# Patient Record
Sex: Female | Born: 1940 | Race: White | Hispanic: No | Marital: Married | Smoking: Former smoker
Health system: Southern US, Community
[De-identification: ages and names within clinical notes are randomized; demographics above are authoritative.]

## PROBLEM LIST (undated history)

## (undated) DIAGNOSIS — H409 Unspecified glaucoma: Secondary | ICD-10-CM

## (undated) DIAGNOSIS — I10 Essential (primary) hypertension: Secondary | ICD-10-CM

## (undated) DIAGNOSIS — C50919 Malignant neoplasm of unspecified site of unspecified female breast: Secondary | ICD-10-CM

## (undated) DIAGNOSIS — E785 Hyperlipidemia, unspecified: Secondary | ICD-10-CM

## (undated) HISTORY — DX: Essential (primary) hypertension: I10

## (undated) HISTORY — DX: Hyperlipidemia, unspecified: E78.5

## (undated) HISTORY — DX: Malignant neoplasm of unspecified site of unspecified female breast: C50.919

## (undated) HISTORY — DX: Unspecified glaucoma: H40.9

---
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
## 1972-07-29 HISTORY — PX: TUBAL LIGATION: SHX77

## 1991-07-30 DIAGNOSIS — C50919 Malignant neoplasm of unspecified site of unspecified female breast: Secondary | ICD-10-CM

## 1991-07-30 HISTORY — DX: Malignant neoplasm of unspecified site of unspecified female breast: C50.919

## 1991-07-30 HISTORY — PX: MASTECTOMY: SHX3

## 1999-06-11 ENCOUNTER — Encounter: Admission: RE | Admit: 1999-06-11 | Discharge: 1999-06-11 | Payer: Self-pay | Admitting: Internal Medicine

## 1999-06-11 ENCOUNTER — Encounter: Payer: Self-pay | Admitting: Internal Medicine

## 2001-01-05 ENCOUNTER — Encounter: Payer: Self-pay | Admitting: Internal Medicine

## 2001-01-05 ENCOUNTER — Encounter: Admission: RE | Admit: 2001-01-05 | Discharge: 2001-01-05 | Payer: Self-pay | Admitting: Internal Medicine

## 2001-03-04 ENCOUNTER — Encounter: Admission: RE | Admit: 2001-03-04 | Discharge: 2001-03-04 | Payer: Self-pay | Admitting: Internal Medicine

## 2001-03-04 ENCOUNTER — Encounter: Payer: Self-pay | Admitting: Internal Medicine

## 2001-10-21 ENCOUNTER — Encounter: Payer: Self-pay | Admitting: Internal Medicine

## 2001-10-21 ENCOUNTER — Encounter: Admission: RE | Admit: 2001-10-21 | Discharge: 2001-10-21 | Payer: Self-pay | Admitting: Internal Medicine

## 2002-03-03 ENCOUNTER — Encounter: Admission: RE | Admit: 2002-03-03 | Discharge: 2002-03-03 | Payer: Self-pay

## 2003-03-22 ENCOUNTER — Other Ambulatory Visit: Admission: RE | Admit: 2003-03-22 | Discharge: 2003-03-22 | Payer: Self-pay | Admitting: Internal Medicine

## 2003-04-22 ENCOUNTER — Encounter: Payer: Self-pay | Admitting: Internal Medicine

## 2003-04-22 ENCOUNTER — Encounter: Admission: RE | Admit: 2003-04-22 | Discharge: 2003-04-22 | Payer: Self-pay | Admitting: Internal Medicine

## 2003-05-19 ENCOUNTER — Encounter: Admission: RE | Admit: 2003-05-19 | Discharge: 2003-05-19 | Payer: Self-pay | Admitting: Internal Medicine

## 2003-05-19 ENCOUNTER — Encounter: Payer: Self-pay | Admitting: Internal Medicine

## 2003-05-24 ENCOUNTER — Encounter: Admission: RE | Admit: 2003-05-24 | Discharge: 2003-05-24 | Payer: Self-pay | Admitting: Internal Medicine

## 2003-05-24 IMAGING — NM NM BONE WHOLE BODY
4 series · 4 of 4 positions shown · non-contrast
Comparison: none

CLINICAL DATA: Left anterolateral chest discomfort, history of bilateral mastectomies in [MJ] for breast carcinoma.

[wb whole body · 2.36mm/px · 1 of 1 slices shown (1 of 4)]
[im 1/1]
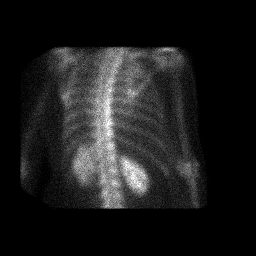

[wb whole body · 2.33mm/px · 1 of 1 slices shown (2 of 4)]
[im 1/1]
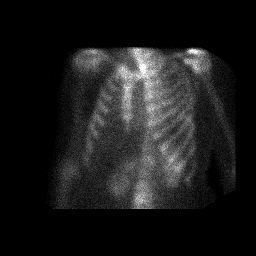

[wb whole body · 1.18mm/px · 1 of 1 slices shown (3 of 4)]
[im 1/1]
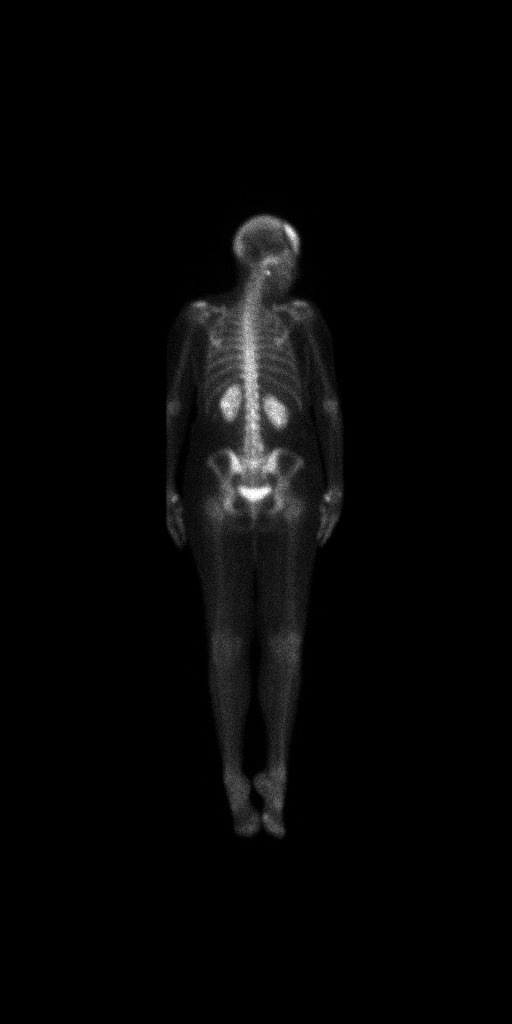

[wb whole body · 1.17mm/px · 1 of 1 slices shown (4 of 4)]
[im 1/1]
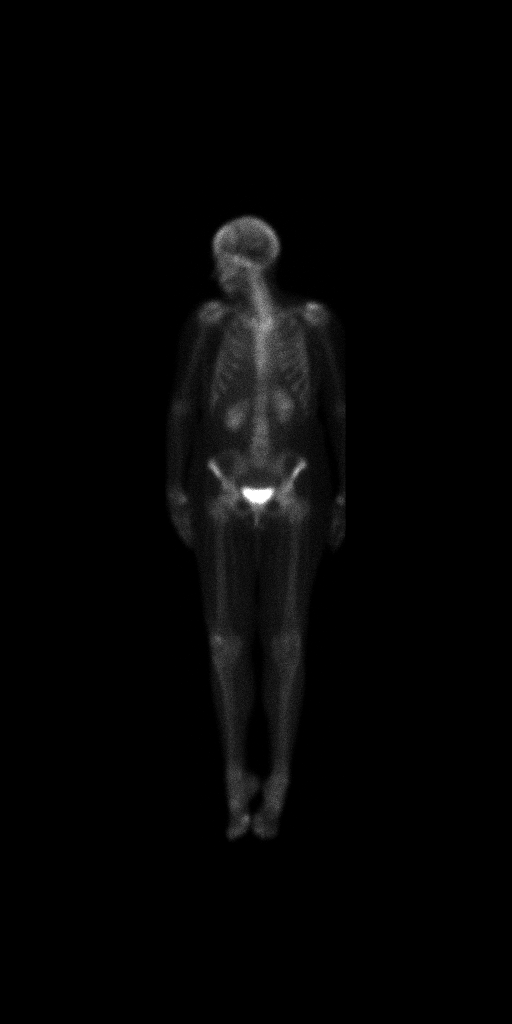

[4 of 4 positions shown; findings below may reference images not displayed]

WHOLE BODY NM BONE SCAN
Following the intravenous administration of [MJ] of [MJ], delayed static images of the bony skeleton were obtained.  These views demonstrate mildly increased tracer uptake in the region of the left acromioclavicular joint, compatible with degenerative change.  Also noted is mildly increased tracer uptake at the posterolateral aspect of the right craniocervical junction, corresponding to artifactual activity shown subsequently on the patient'VELROY VELROY.  Also demonstrated is mildly increased tracer uptake in the radial aspects of both wrists and in the right patella and right 1st metatarsal phalangeal joint, also compatible with degenerative changes.  Changes of hyperostosis frontalis are noted.  Normal renal and bladder activity is noted. 

IMPRESSION
Degenerative changes as described above.  No evidence of bony metastatic disease and no abnormality in the region of discomfort reported by the patient.

## 2003-08-08 ENCOUNTER — Encounter: Admission: RE | Admit: 2003-08-08 | Discharge: 2003-08-08 | Payer: Self-pay | Admitting: Internal Medicine

## 2004-01-11 ENCOUNTER — Encounter: Admission: RE | Admit: 2004-01-11 | Discharge: 2004-01-11 | Payer: Self-pay | Admitting: Internal Medicine

## 2004-01-11 IMAGING — CT CT CHEST W/ CM
1 of 2 series · 15 of 30 positions shown, 19 images · IV contrast ([ID] OMNI 300)
Comparison: none

CLINICAL DATA: Fever of unknown origin, fatigue, cough, and some dysphagia. 
 CT NECK W/CONTRAST: 
 Multidetector helical scans through the neck were performed after IV contrast media was given.  [FK] of Omnipaque 300 were given as the contrast media.  
 The paranasal sinuses that are visualized are clear.  There is asymmetrical prominence of the right jugular bulb of doubtful significance.  No bony abnormality is seen.  There are a few slightly prominent nodes in the anterior cervical chain particularly on the left.  On image number 51 there is a node adjacent to the left jugular vein of 6 x 11mm.  The oro-nasopharynx appears grossly normal.  However there is prominence of the aryepiglottic folds extending toward the puriform sinuses left greater than right.  The attenuation of the soft tissue is somewhat inhomogeneous and supraglottic tumor cannot be excluded.  Direct visualization of this region is recommended.  The level of the cords in the subglottic airway appears normal.

[Series 2: neck/chest -head first · axial · 0.64mm/px · z∈[-439,-9]mm · 15 of 173 slices shown, 19 images]
[im 9/173  mediastinal]
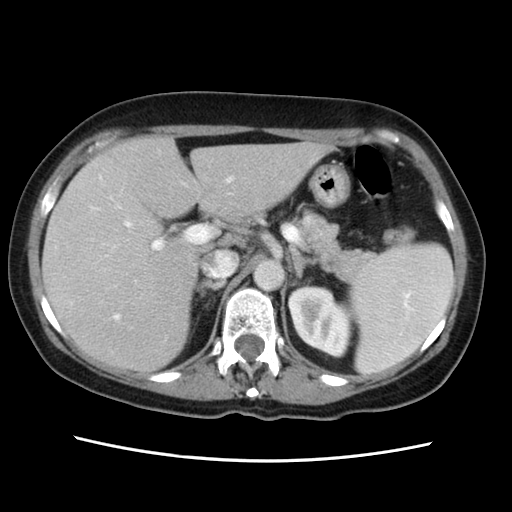
[im 9/173  lung]
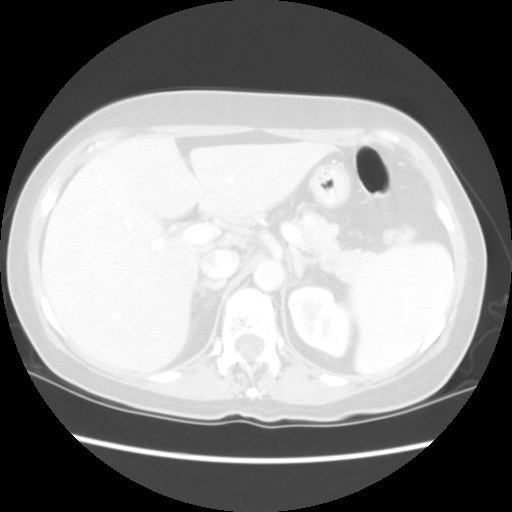
[im 26/173  lung]
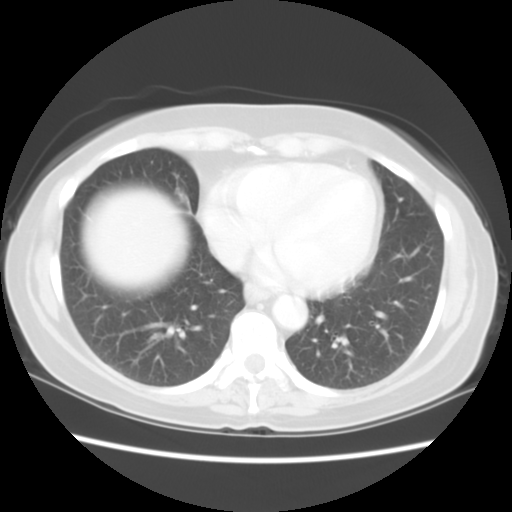
[im 35/173  lung]
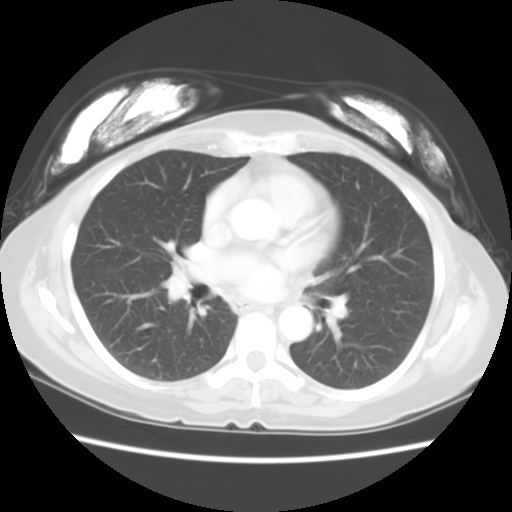
[im 44/173  lung]
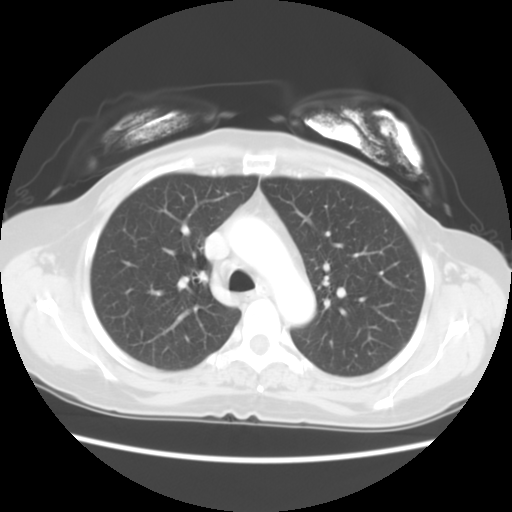
[im 54/173  mediastinal]
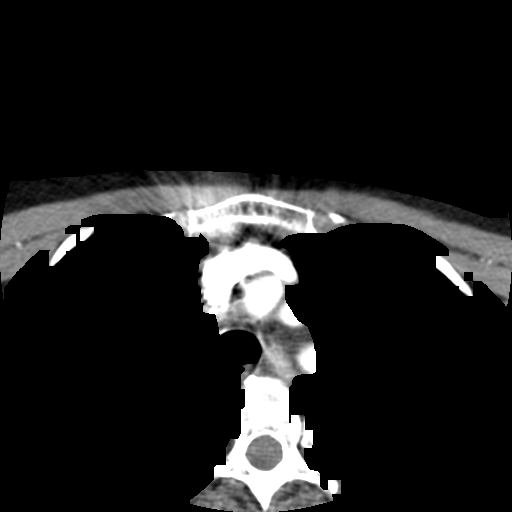
[im 54/173  lung]
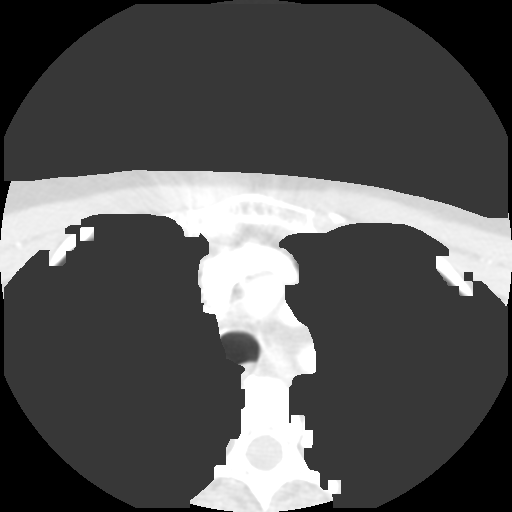
[im 61/173  lung]
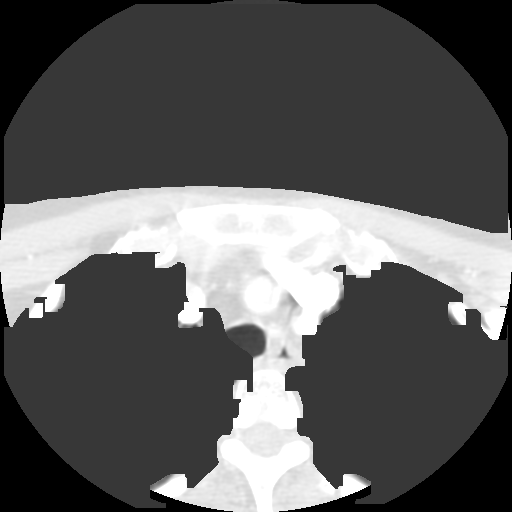
[im 69/173  lung]
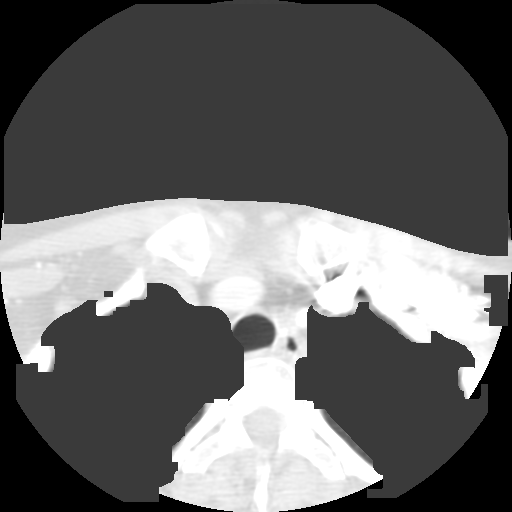
[im 87/173  lung]
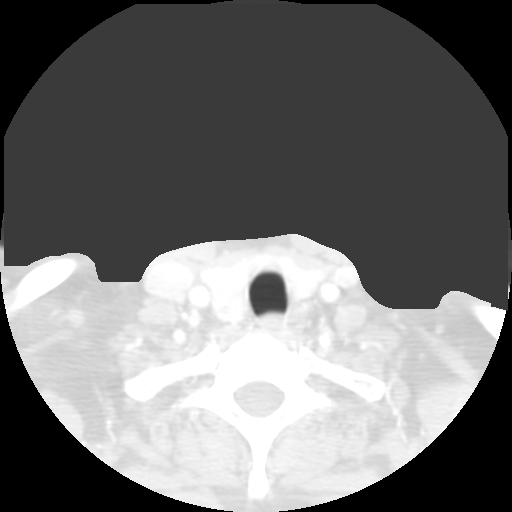
[im 95/173  mediastinal]
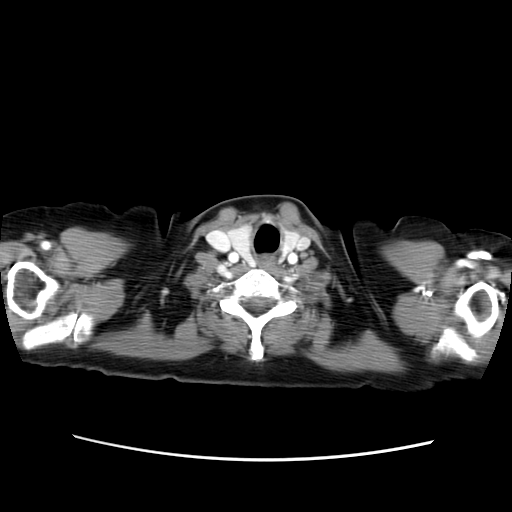
[im 95/173  lung]
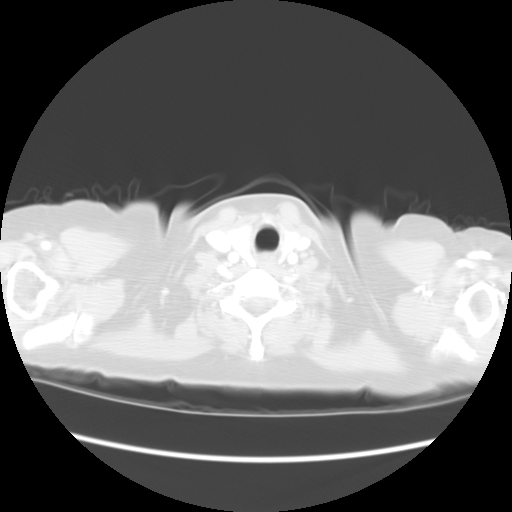
[im 104/173  lung]
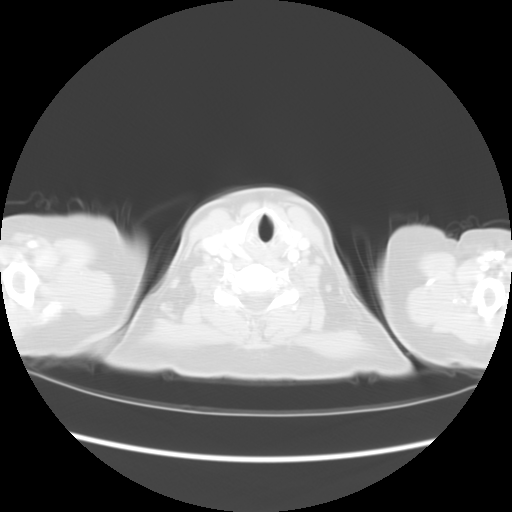
[im 121/173  lung]
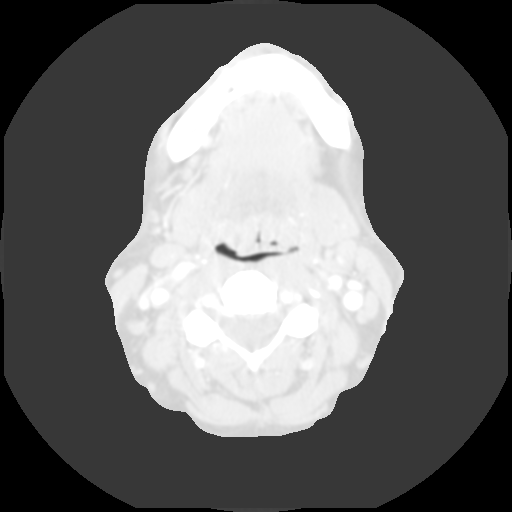
[im 130/173  lung]
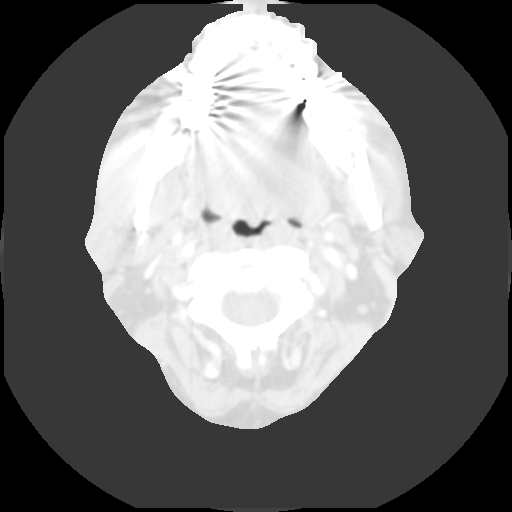
[im 138/173  mediastinal]
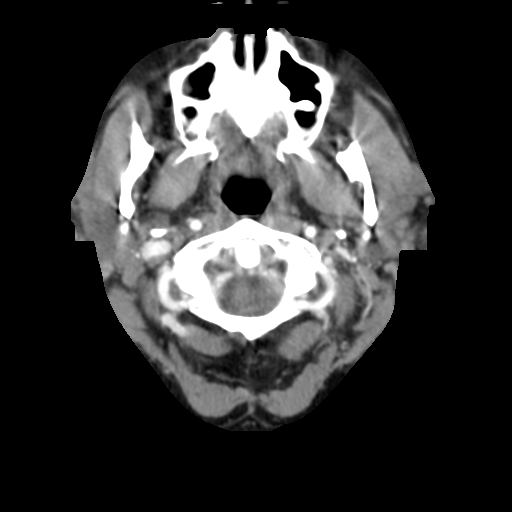
[im 138/173  lung]
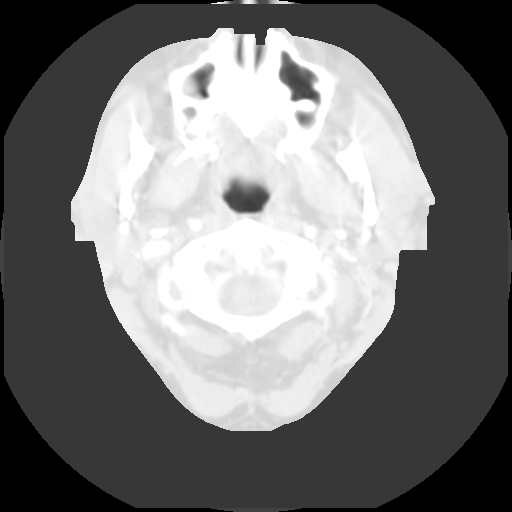
[im 155/173  lung]
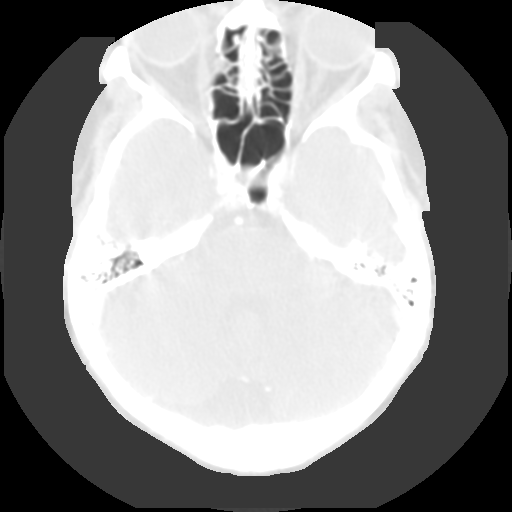
[im 164/173  lung]
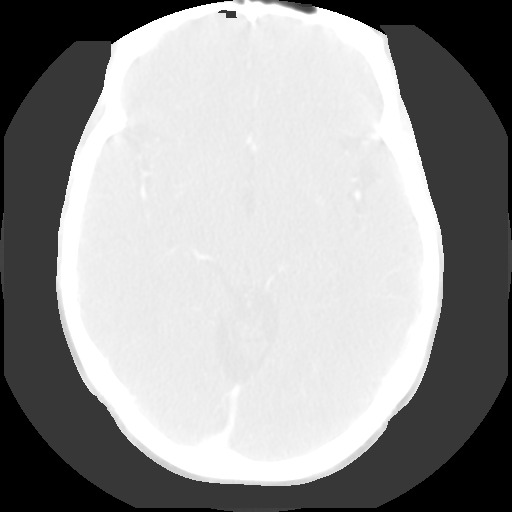

[15 of 30 positions shown; findings below may reference images not displayed]

IMPRESSION: 1.  Thickened mucosa of the aryepiglottic folds and narrowing of the left puriform sinus.  Tumor cannot be excluded and direct visualization of this region is recommended.  
 2.  There are a few slightly prominent anterior cervical chain nodes on the left. 
 CT CHEST W/CONTRAST: 
 Scans were continued through the chest after oral and IV contrast media were given.  There is only a single lung nodule present in the anterior left lower lobe on image number 34 measuring 3mm which is non calcified.  No pleural effusion is seen.  On mediastinal window images no mediastinal or hilar adenopathy is seen.  The pulmonary arteries and thoracic aorta opacify normally.  There is mild cardiomegaly present.  The spleen is slightly prominent on scans through the upper abdomen.
IMPRESSION: 1.  Single non calcified 3mm left lower lobe nodule.  No adenopathy. 
 2.  Slightly prominent spleen of questionable significance.

## 2004-04-12 ENCOUNTER — Other Ambulatory Visit: Admission: RE | Admit: 2004-04-12 | Discharge: 2004-04-12 | Payer: Self-pay | Admitting: Internal Medicine

## 2004-05-08 ENCOUNTER — Ambulatory Visit (HOSPITAL_COMMUNITY): Admission: RE | Admit: 2004-05-08 | Discharge: 2004-05-08 | Payer: Self-pay | Admitting: Internal Medicine

## 2005-09-10 ENCOUNTER — Other Ambulatory Visit: Admission: RE | Admit: 2005-09-10 | Discharge: 2005-09-10 | Payer: Self-pay | Admitting: Internal Medicine

## 2006-03-07 ENCOUNTER — Encounter: Admission: RE | Admit: 2006-03-07 | Discharge: 2006-03-07 | Payer: Self-pay | Admitting: Internal Medicine

## 2006-03-07 IMAGING — CT CT NECK W/ CM
4 of 6 series · 16 of 33 positions shown, 18 images · IV contrast ([ID] OMNI 300)
Comparison: Prior CT neck and chest of [DATE].

CLINICAL DATA: Follow-up lung nodule.  History of breast carcinoma.  Follow-up questionable abnormality of the left piriform sinus on prior CT neck.
 CT NECK WITH CONTRAST:
TECHNIQUE: Multidetector CT imaging of the neck was performed following the standard protocol during administration of intravenous contrast.
 Contrast:  100 cc Omnipaque 300
TECHNIQUE: Multidetector CT imaging of the chest was performed following the standard protocol during bolus administration of intravenous contrast.

[Series 2: chest/neck · axial · 0.70mm/px · z∈[-516,-124]mm · 7 of 133 slices shown, 9 images]
[im 17/133  soft-tissue]
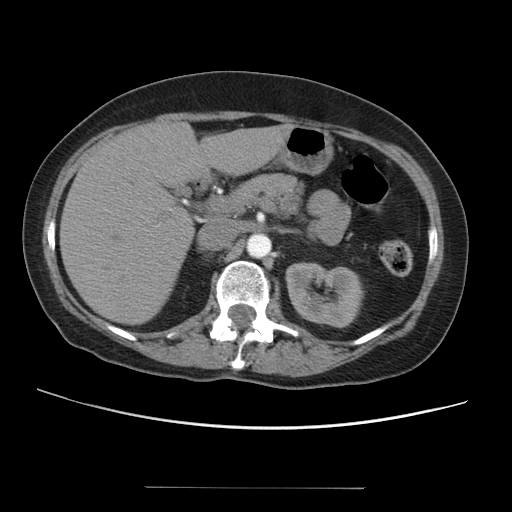
[im 17/133  bone]
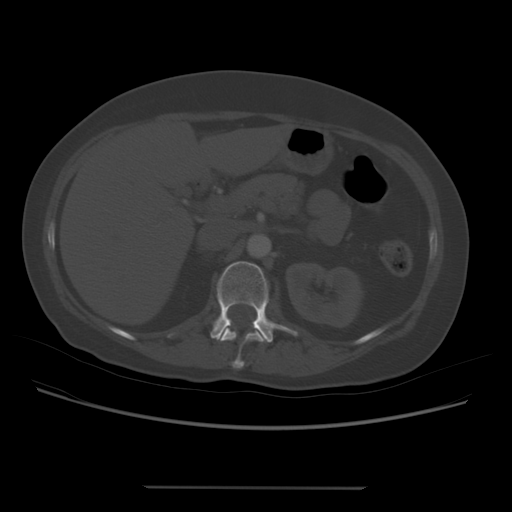
[im 34/133  bone]
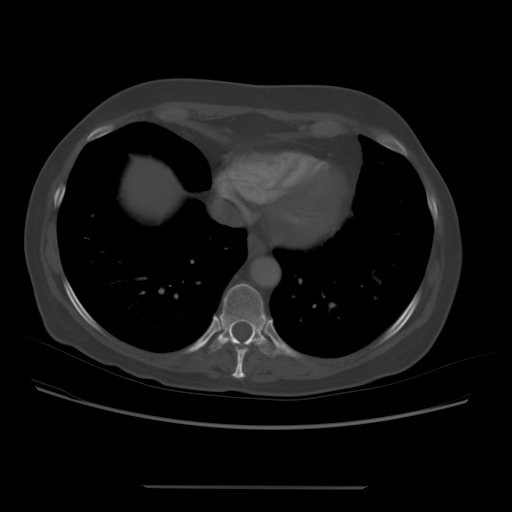
[im 50/133  bone]
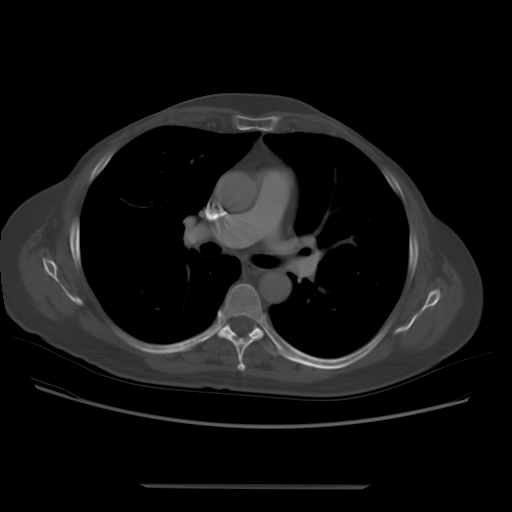
[im 67/133  bone]
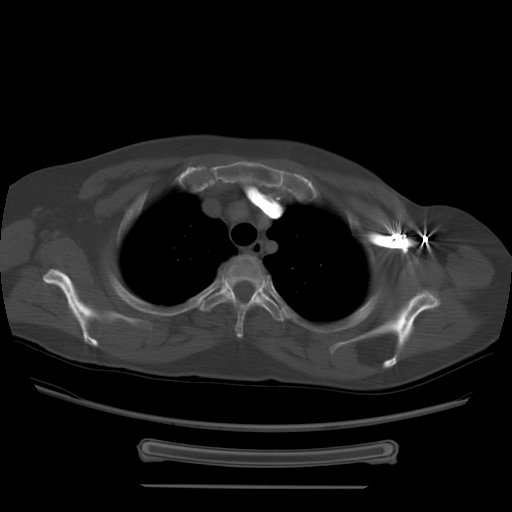
[im 83/133  soft-tissue]
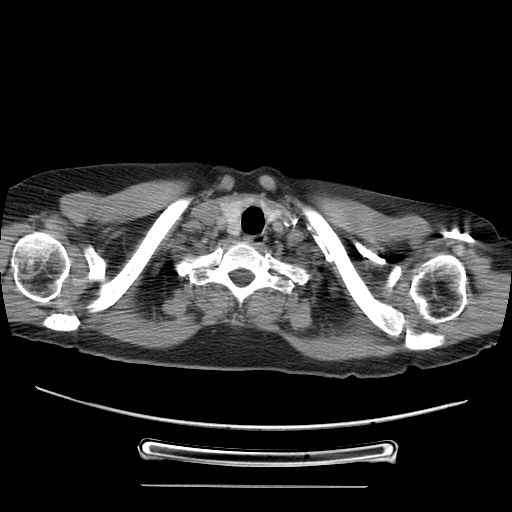
[im 83/133  bone]
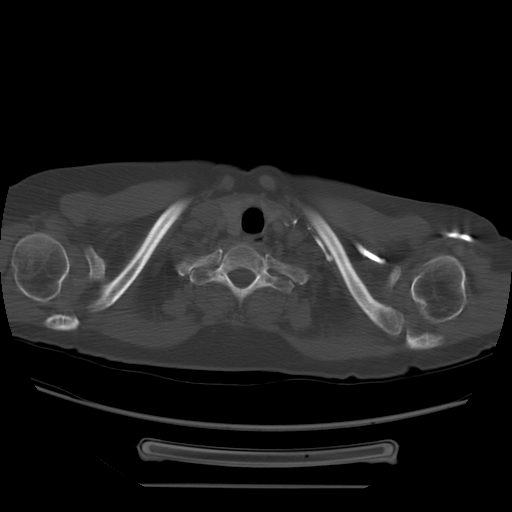
[im 100/133  bone]
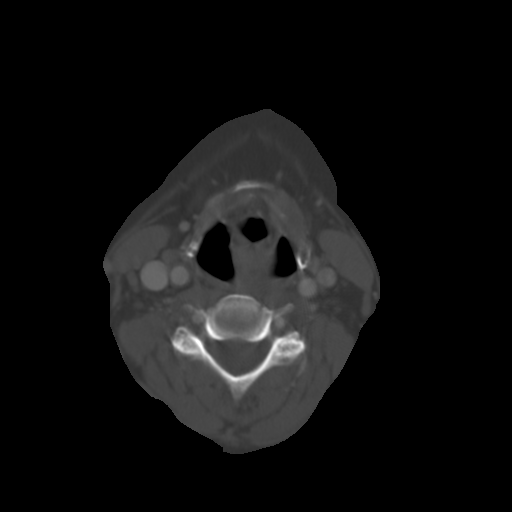
[im 116/133  bone]
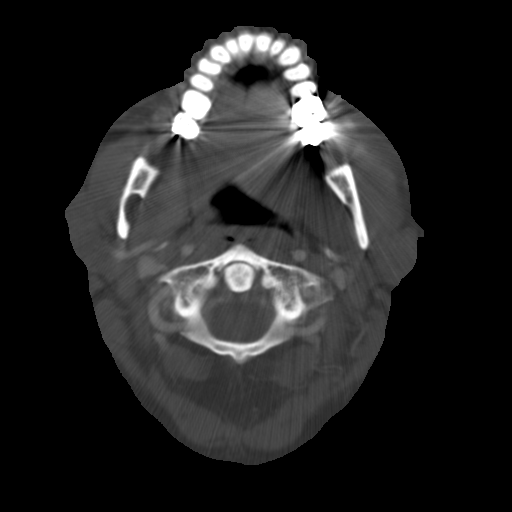

[Series 3: lung · axial · 0.70mm/px · z∈[-506,-322]mm · 3 of 75 slices shown]
[im 19/75  bone]
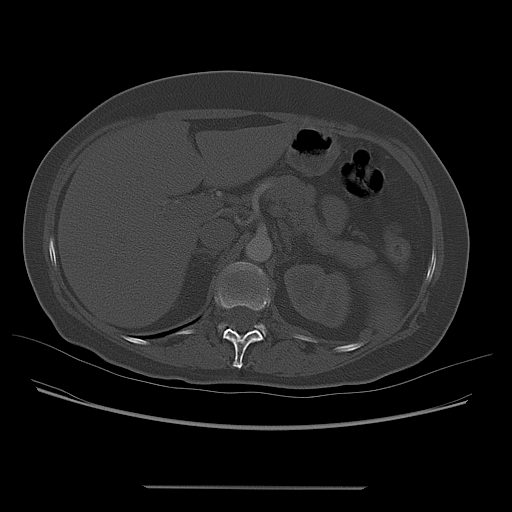
[im 38/75  bone]
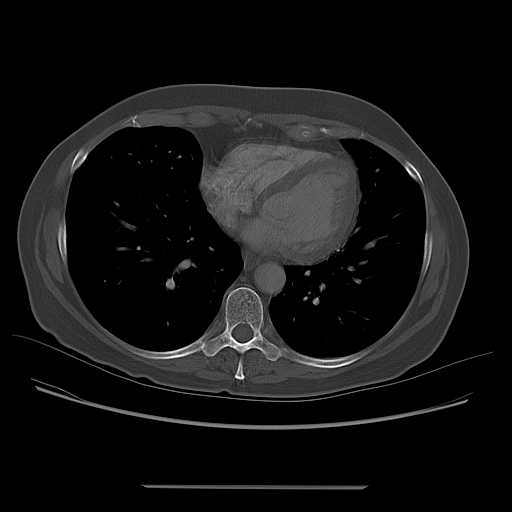
[im 56/75  bone]
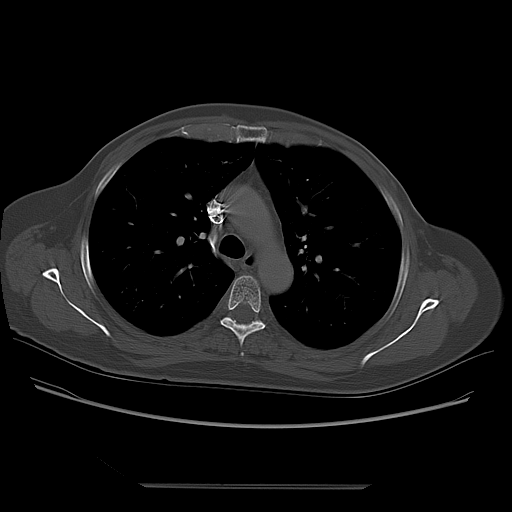

[Series 401: reformatted · coronal · 0.43mm/px · 1 of 70 slices shown (1 of 2)]
[im 35/70  bone]
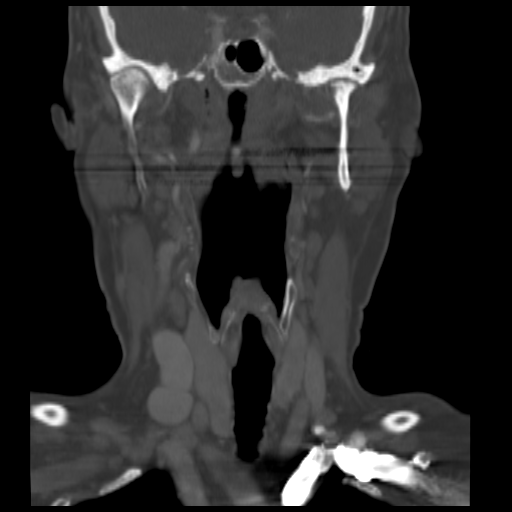

[Series 402: reformatted · sagittal · 0.74mm/px · 5 of 124 slices shown (2 of 2)]
[im 31/124  bone]
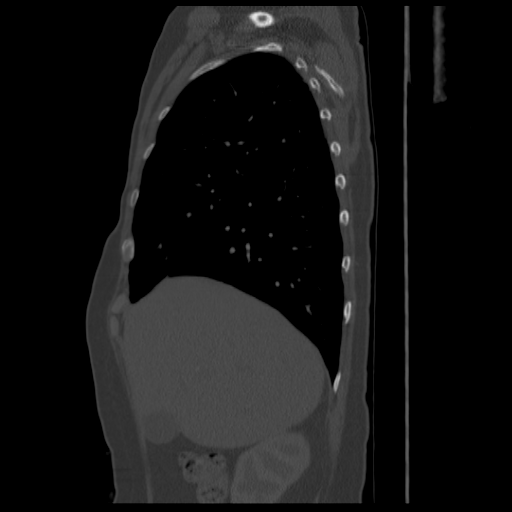
[im 47/124  bone]
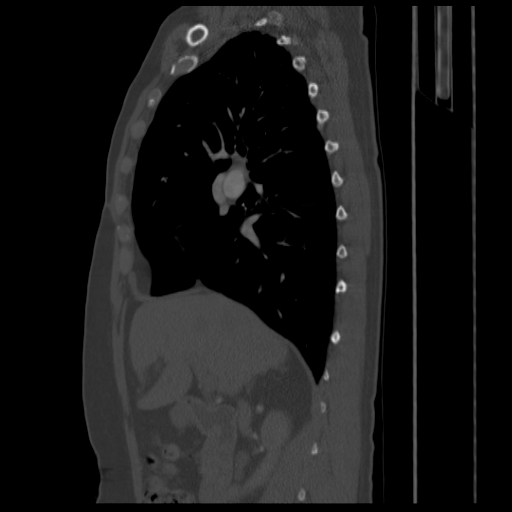
[im 62/124  bone]
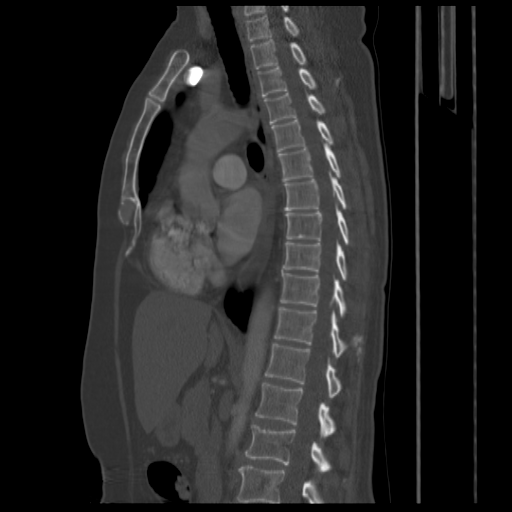
[im 77/124  bone]
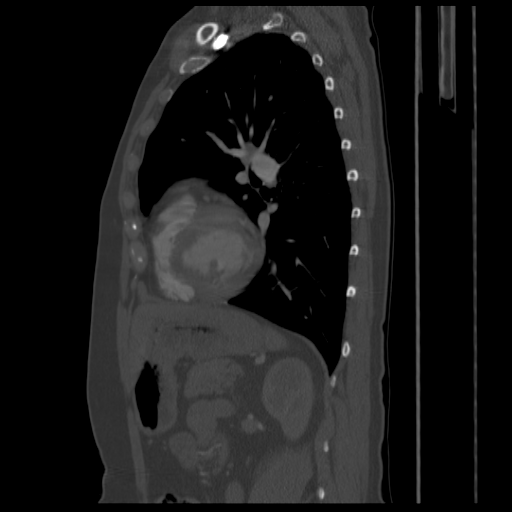
[im 93/124  bone]
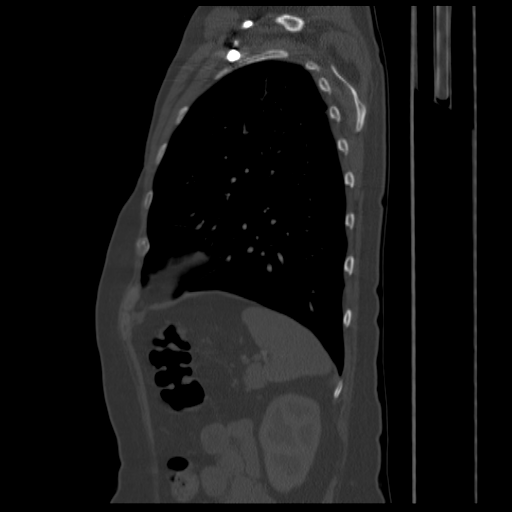

[16 of 33 positions shown; findings below may reference images not displayed]

FINDINGS: Prominent cisterna magna is again noted.  The oro-nasopharynx appears normal as does the lingual tissue.  The aryepiglottic folds, vallecula, and piriform sinuses appear normal.  The left piriform sinus does distend with air currently.   The level of the true cords is normal as is the subglottic airway.  No adenopathy is seen.  Small thyroid nodules are present and appear stable.  The paranasal sinuses that are visualized are clear.
IMPRESSION: Negative CT of the neck.  No mass or adenopathy is seen.
 CT CHEST WITH CONTRAST:
FINDINGS: Scans were continued through the chest and compared to the CT chest of [DATE].  No mediastinal or hilar adenopathy is seen.  The thoracic aorta and pulmonary arteries opacify with no significant abnormality.  On lung window images, the 3 mm nodule within the left lower lobe anterolaterally is stable.  Since the patient is low risk and this has remained stable, no further evaluation is necessary.  No new lung  nodule is seen.   No effusion is noted.
IMPRESSION: 1.  Stable 3 mm nodule in the left lower lobe.   No further follow-up is felt to be necessary.
 2.  No new lung nodule is seen.

## 2007-08-20 ENCOUNTER — Other Ambulatory Visit: Admission: RE | Admit: 2007-08-20 | Discharge: 2007-08-20 | Payer: Self-pay | Admitting: Family Medicine

## 2007-08-21 ENCOUNTER — Encounter: Admission: RE | Admit: 2007-08-21 | Discharge: 2007-08-21 | Payer: Self-pay | Admitting: Family Medicine

## 2007-08-21 IMAGING — US US EXTREM LOW NON VASC*R*
1 series · 7 of 7 positions shown · non-contrast
Comparison: none

CLINICAL DATA: Right thigh nodule, evaluate for lipoma.
 ULTRASOUND OF THE RIGHT LOWER EXTREMITY: 
 Ultrasound of the right posterior thigh was performed.  At the site of the palpable abnormality, there is a poorly defined oval area of increased echogenicity.  This probably represents a lipoma measuring 2.1 cm sagittally with a depth of 0.7 cm and width of 1.4 cm.  This is not quite as pliable palpably as lipomas generally are and therefore I recommend follow-up clinically.  If this area increases in size, then MRI of the right thigh would be recommended.

[Series 1: us extrem low non vasc*right* · 0.07mm/px · 7 of 7 slices shown]
[im 1/7]
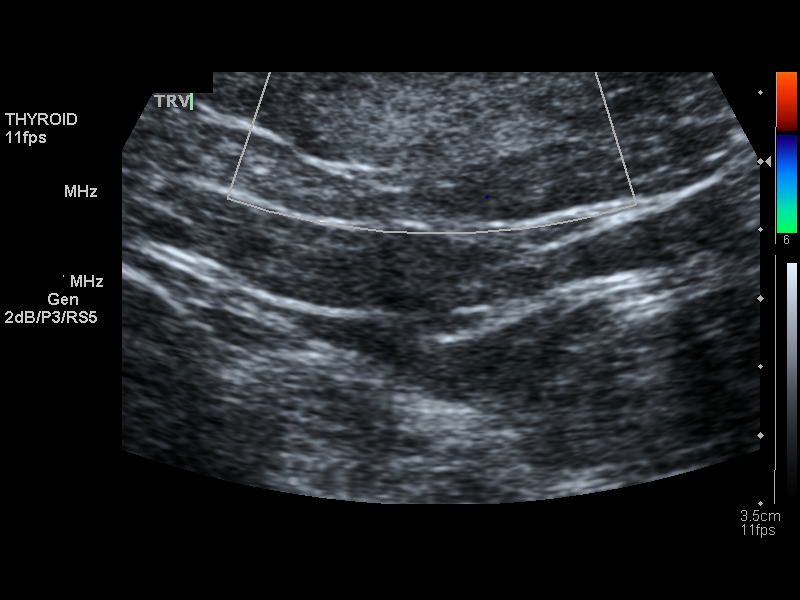
[im 2/7]
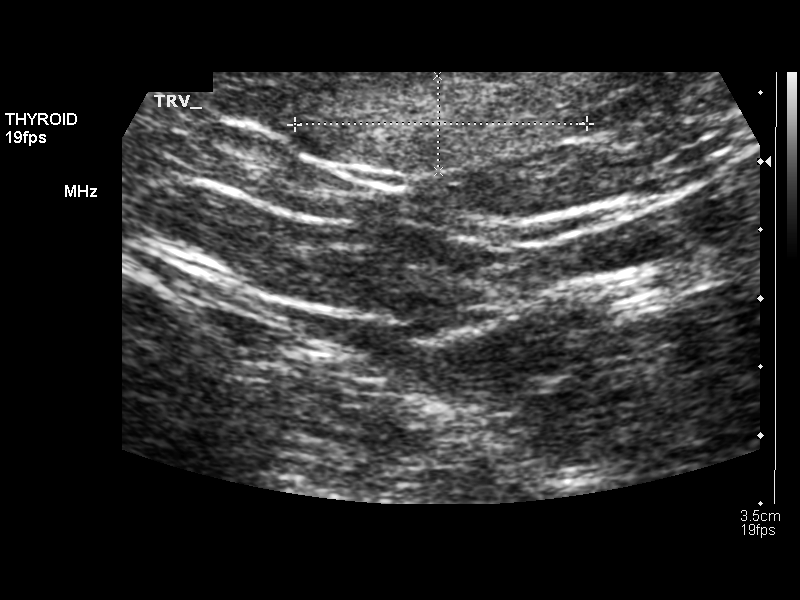
[im 3/7]
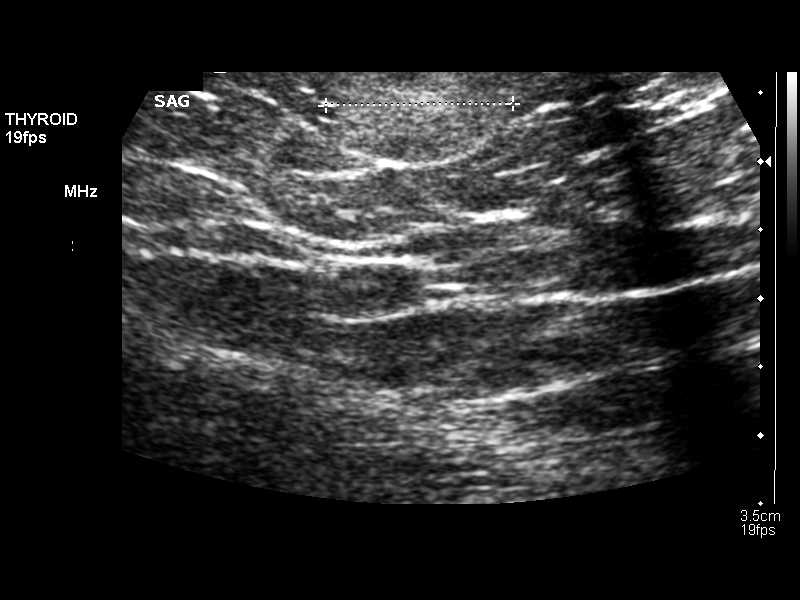
[im 4/7]
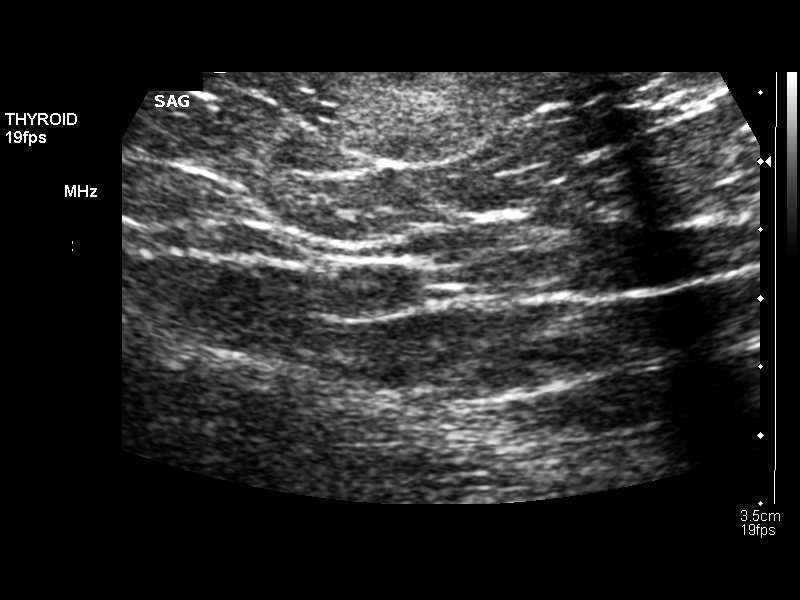
[im 5/7]
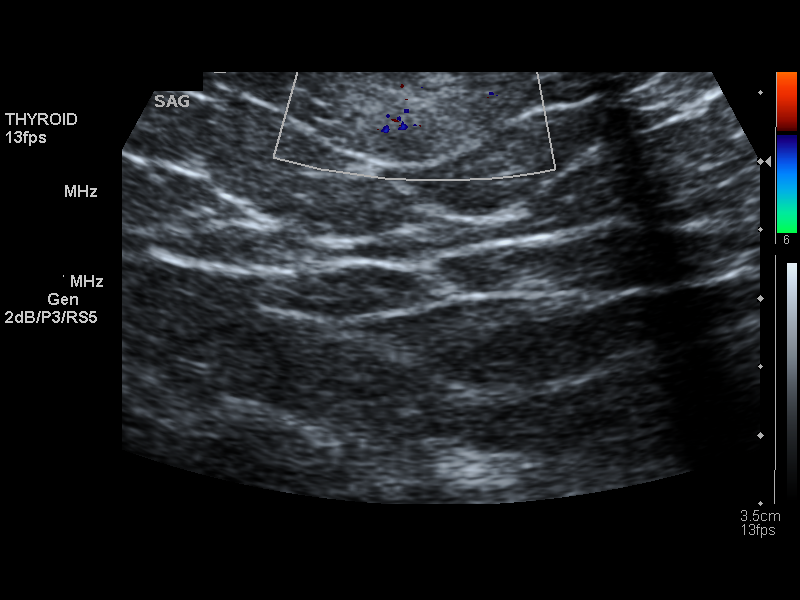
[im 6/7]
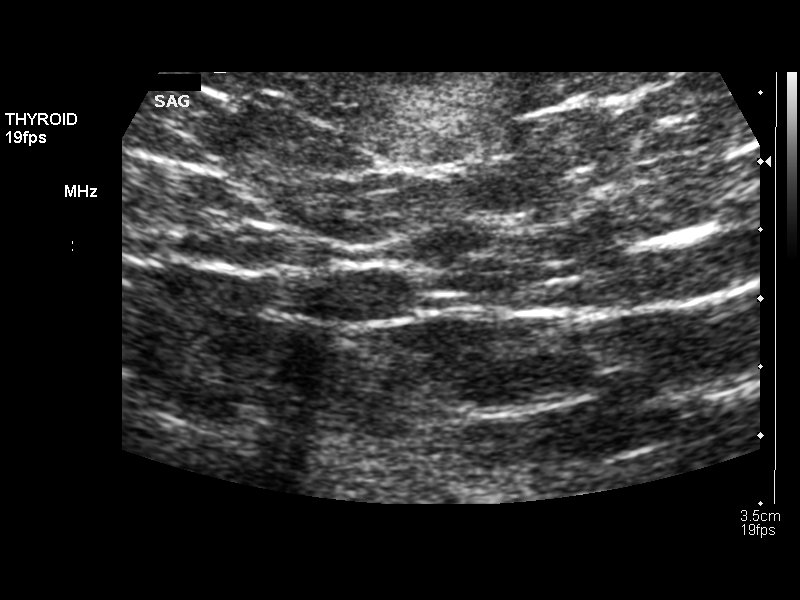
[im 7/7]
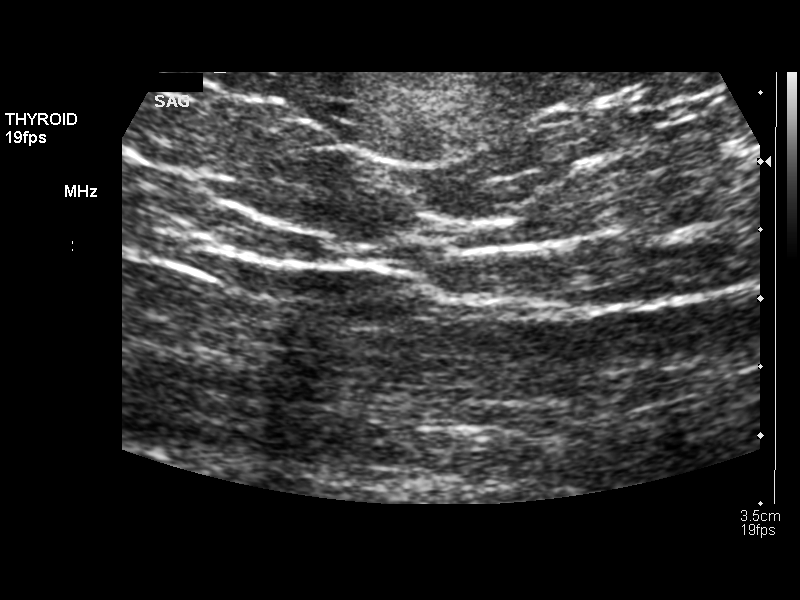

[7 of 7 positions shown; findings below may reference images not displayed]

IMPRESSION: Probable lipoma in the posterior right upper thigh, as described above.  Recommend clinical follow-up and consider MRI if further assessment is warranted.

## 2009-09-08 ENCOUNTER — Other Ambulatory Visit: Admission: RE | Admit: 2009-09-08 | Discharge: 2009-09-08 | Payer: Self-pay | Admitting: Family Medicine

## 2010-08-18 ENCOUNTER — Encounter: Payer: Self-pay | Admitting: Internal Medicine

## 2011-04-27 ENCOUNTER — Emergency Department (HOSPITAL_COMMUNITY)
Admission: EM | Admit: 2011-04-27 | Discharge: 2011-04-27 | Disposition: A | Discharge status: Home / Self Care | Payer: Medicare HMO | Attending: Emergency Medicine | Admitting: Emergency Medicine

## 2011-04-27 DIAGNOSIS — I10 Essential (primary) hypertension: Secondary | ICD-10-CM | POA: Insufficient documentation

## 2011-04-27 DIAGNOSIS — H409 Unspecified glaucoma: Secondary | ICD-10-CM | POA: Insufficient documentation

## 2011-04-27 DIAGNOSIS — R319 Hematuria, unspecified: Secondary | ICD-10-CM | POA: Insufficient documentation

## 2011-04-27 DIAGNOSIS — R300 Dysuria: Secondary | ICD-10-CM | POA: Insufficient documentation

## 2011-04-27 DIAGNOSIS — R350 Frequency of micturition: Secondary | ICD-10-CM | POA: Insufficient documentation

## 2011-04-27 DIAGNOSIS — N390 Urinary tract infection, site not specified: Secondary | ICD-10-CM | POA: Insufficient documentation

## 2011-04-27 LAB — URINALYSIS, ROUTINE W REFLEX MICROSCOPIC
Bilirubin Urine: NEGATIVE
Glucose, UA: NEGATIVE mg/dL
Protein, ur: 100 mg/dL — AB
Urobilinogen, UA: 0.2 mg/dL (ref 0.0–1.0)

## 2011-04-27 LAB — URINE MICROSCOPIC-ADD ON

## 2013-03-19 ENCOUNTER — Encounter: Payer: Self-pay | Admitting: Gastroenterology

## 2013-03-24 ENCOUNTER — Encounter: Payer: Self-pay | Admitting: Gastroenterology

## 2013-04-01 ENCOUNTER — Other Ambulatory Visit: Payer: Self-pay | Admitting: Dermatology

## 2013-04-26 ENCOUNTER — Ambulatory Visit (AMBULATORY_SURGERY_CENTER): Payer: Self-pay | Admitting: *Deleted

## 2013-04-26 VITALS — Ht 65.0 in | Wt 141.6 lb

## 2013-04-26 DIAGNOSIS — Z1211 Encounter for screening for malignant neoplasm of colon: Secondary | ICD-10-CM

## 2013-04-26 MED ORDER — MOVIPREP 100 G PO SOLR: ORAL | Status: DC

## 2013-04-26 NOTE — Progress Notes (Signed)
No allergies to eggs or soy. No problems with anesthesia.  

## 2013-04-27 ENCOUNTER — Encounter: Payer: Self-pay | Admitting: Gastroenterology

## 2013-05-10 ENCOUNTER — Ambulatory Visit (AMBULATORY_SURGERY_CENTER): Payer: Medicare Other | Admitting: Gastroenterology

## 2013-05-10 ENCOUNTER — Encounter: Payer: Self-pay | Admitting: Gastroenterology

## 2013-05-10 VITALS — BP 147/77 | HR 61 | Temp 97.1°F | Resp 19 | Ht 65.0 in | Wt 141.0 lb

## 2013-05-10 DIAGNOSIS — Z1211 Encounter for screening for malignant neoplasm of colon: Secondary | ICD-10-CM

## 2013-05-10 MED ORDER — SODIUM CHLORIDE 0.9 % IV SOLN: 500.0000 mL | INTRAVENOUS | Status: DC

## 2013-05-10 NOTE — Patient Instructions (Signed)
YOU HAD AN ENDOSCOPIC PROCEDURE TODAY AT THE [facility]: Refer to the procedure report that was given to you for any specific questions about what was found during the examination.  If the procedure report does not answer your questions, please call your gastroenterologist to clarify.  If you requested that your care partner not be given the details of your procedure findings, then the procedure report has been included in a sealed envelope for you to review at your convenience later.  YOU SHOULD EXPECT: Some feelings of bloating in the abdomen. Passage of more gas than usual.  Walking can help get rid of the air that was put into your GI tract during the procedure and reduce the bloating. If you had a lower endoscopy (such as a colonoscopy or flexible sigmoidoscopy) you may notice spotting of blood in your stool or on the toilet paper. If you underwent a bowel prep for your procedure, then you may not have a normal bowel movement for a few days.  DIET: Your first meal following the procedure should be a light meal and then it is ok to progress to your normal diet.  A half-sandwich or bowl of soup is an example of a good first meal.  Heavy or fried foods are harder to digest and may make you feel nauseous or bloated.  Likewise meals heavy in dairy and vegetables can cause extra gas to form and this can also increase the bloating.  Drink plenty of fluids but you should avoid alcoholic beverages for 24 hours.  ACTIVITY: Your care partner should take you home directly after the procedure.  You should plan to take it easy, moving slowly for the rest of the day.  You can resume normal activity the day after the procedure however you should NOT DRIVE or use heavy machinery for 24 hours (because of the sedation medicines used during the test).    SYMPTOMS TO REPORT IMMEDIATELY: A gastroenterologist can be reached at any hour.  During normal business hours, 8:30 AM to 5:00 PM Monday through Friday,  call [phone].  After hours and on weekends, please call the GI answering service at [phone] who will take a message and have the physician on call contact you.   Following lower endoscopy (colonoscopy or flexible sigmoidoscopy):  Excessive amounts of blood in the stool  Significant tenderness or worsening of abdominal pains  Swelling of the abdomen that is new, acute  Fever of 100F or higher  FOLLOW UP: If any biopsies were taken you will be contacted by phone or by letter within the next 1-3 weeks.  Call your gastroenterologist if you have not heard about the biopsies in 3 weeks.  Our staff will call the home number listed on your records the next business day following your procedure to check on you and address any questions or concerns that you may have at that time regarding the information given to you following your procedure. This is a courtesy call and so if there is no answer at the home number and we have not heard from you through the emergency physician on call, we will assume that you have returned to your regular daily activities without incident.  SIGNATURES/CONFIDENTIALITY: You and/or your care partner have signed paperwork which will be entered into your electronic medical record.  These signatures attest to the fact that that the information above on your After Visit Summary has been reviewed and is understood.  Full responsibility of the confidentiality of this   discharge information lies with you and/or your care-partner.  Resume medications. 

## 2013-05-10 NOTE — Progress Notes (Signed)
Report to pacu rn, vss, bbs=clear 

## 2013-05-10 NOTE — Op Note (Signed)
[facility] [address] [place], [address]   COLONOSCOPY PROCEDURE REPORT  PATIENT: Sandra, Becker  MR#: [id number] BIRTHDATE: 27-Aug-1940 , 72  yrs. old GENDER: Female ENDOSCOPIST: Mardella Layman, MD, [facility] REFERRED XB:JYNWGN Valentina Lucks, M.D. PROCEDURE DATE:  05/10/2013 PROCEDURE:   Colonoscopy, screening First Screening Colonoscopy - Avg.  risk and is 50 yrs.  old or older - No.      History of Adenoma - Now for follow-up colonoscopy & has been > or = to 3 yrs.  N/A ASA CLASS:   Class II INDICATIONS:average risk screening. MEDICATIONS: propofol (Diprivan) 250mg  IV  DESCRIPTION OF PROCEDURE:   After the risks benefits and alternatives of the procedure were thoroughly explained, informed consent was obtained.  A digital rectal exam revealed no abnormalities of the rectum.   The LB [id number]  endoscope was introduced through the anus and advanced to the cecum, which was identified by both the appendix and ileocecal valve. No adverse events experienced.   The quality of the prep was excellent, using MoviPrep  The instrument was then slowly withdrawn as the colon was fully examined.      COLON FINDINGS: The colon was redundant.  Manual abdominal counter-pressure was used to reach the cecum.   The colon was otherwise normal.  There was no diverticulosis, inflammation, polyps or cancers unless previously stated.  Retroflexed views revealed no abnormalities. The time to cecum=10 minutes 44 seconds. Withdrawal time=6 minutes 35 seconds.  The scope was withdrawn and the procedure completed. COMPLICATIONS: There were no complications.  ENDOSCOPIC IMPRESSION: 1.   The colon was redundant and tortuous 2.   The colon was otherwise normal  RECOMMENDATIONS: 1.  Continue current medications 2.  Given your age, you will not need another colonoscopy for colon cancer screening or polyp surveillance.  These types of tests usually stop around the age  67.   eSigned:  Mardella Layman, MD, [facility] 05/10/2013 9:25 AM   cc:

## 2013-05-10 NOTE — Progress Notes (Signed)
Patient did not experience any of the following events: a burn prior to discharge; a fall within the facility; wrong site/side/patient/procedure/implant event; or a hospital transfer or hospital admission upon discharge from the facility. (G8907) Patient did not have preoperative order for IV antibiotic SSI prophylaxis. (G8918)  

## 2013-05-11 ENCOUNTER — Telehealth: Payer: Self-pay

## 2013-05-11 NOTE — Telephone Encounter (Signed)
Left message on answering machine. 

## 2013-11-11 ENCOUNTER — Other Ambulatory Visit: Payer: Self-pay | Admitting: Family Medicine

## 2013-11-11 ENCOUNTER — Ambulatory Visit
Admission: RE | Admit: 2013-11-11 | Discharge: 2013-11-11 | Disposition: A | Discharge status: Home / Self Care | Payer: Medicare Other | Source: Ambulatory Visit | Attending: Family Medicine | Admitting: Family Medicine

## 2013-11-11 DIAGNOSIS — R059 Cough, unspecified: Secondary | ICD-10-CM

## 2013-11-11 DIAGNOSIS — R05 Cough: Secondary | ICD-10-CM

## 2013-11-11 DIAGNOSIS — R0989 Other specified symptoms and signs involving the circulatory and respiratory systems: Secondary | ICD-10-CM

## 2013-11-11 IMAGING — CR DG CHEST 2V
2 series · 2 of 2 positions shown · non-contrast
Comparison: [DATE] rib films.

CLINICAL DATA: Cough.  Abnormal breath sounds.

EXAM:
CHEST  2 VIEW

[w chest pa]
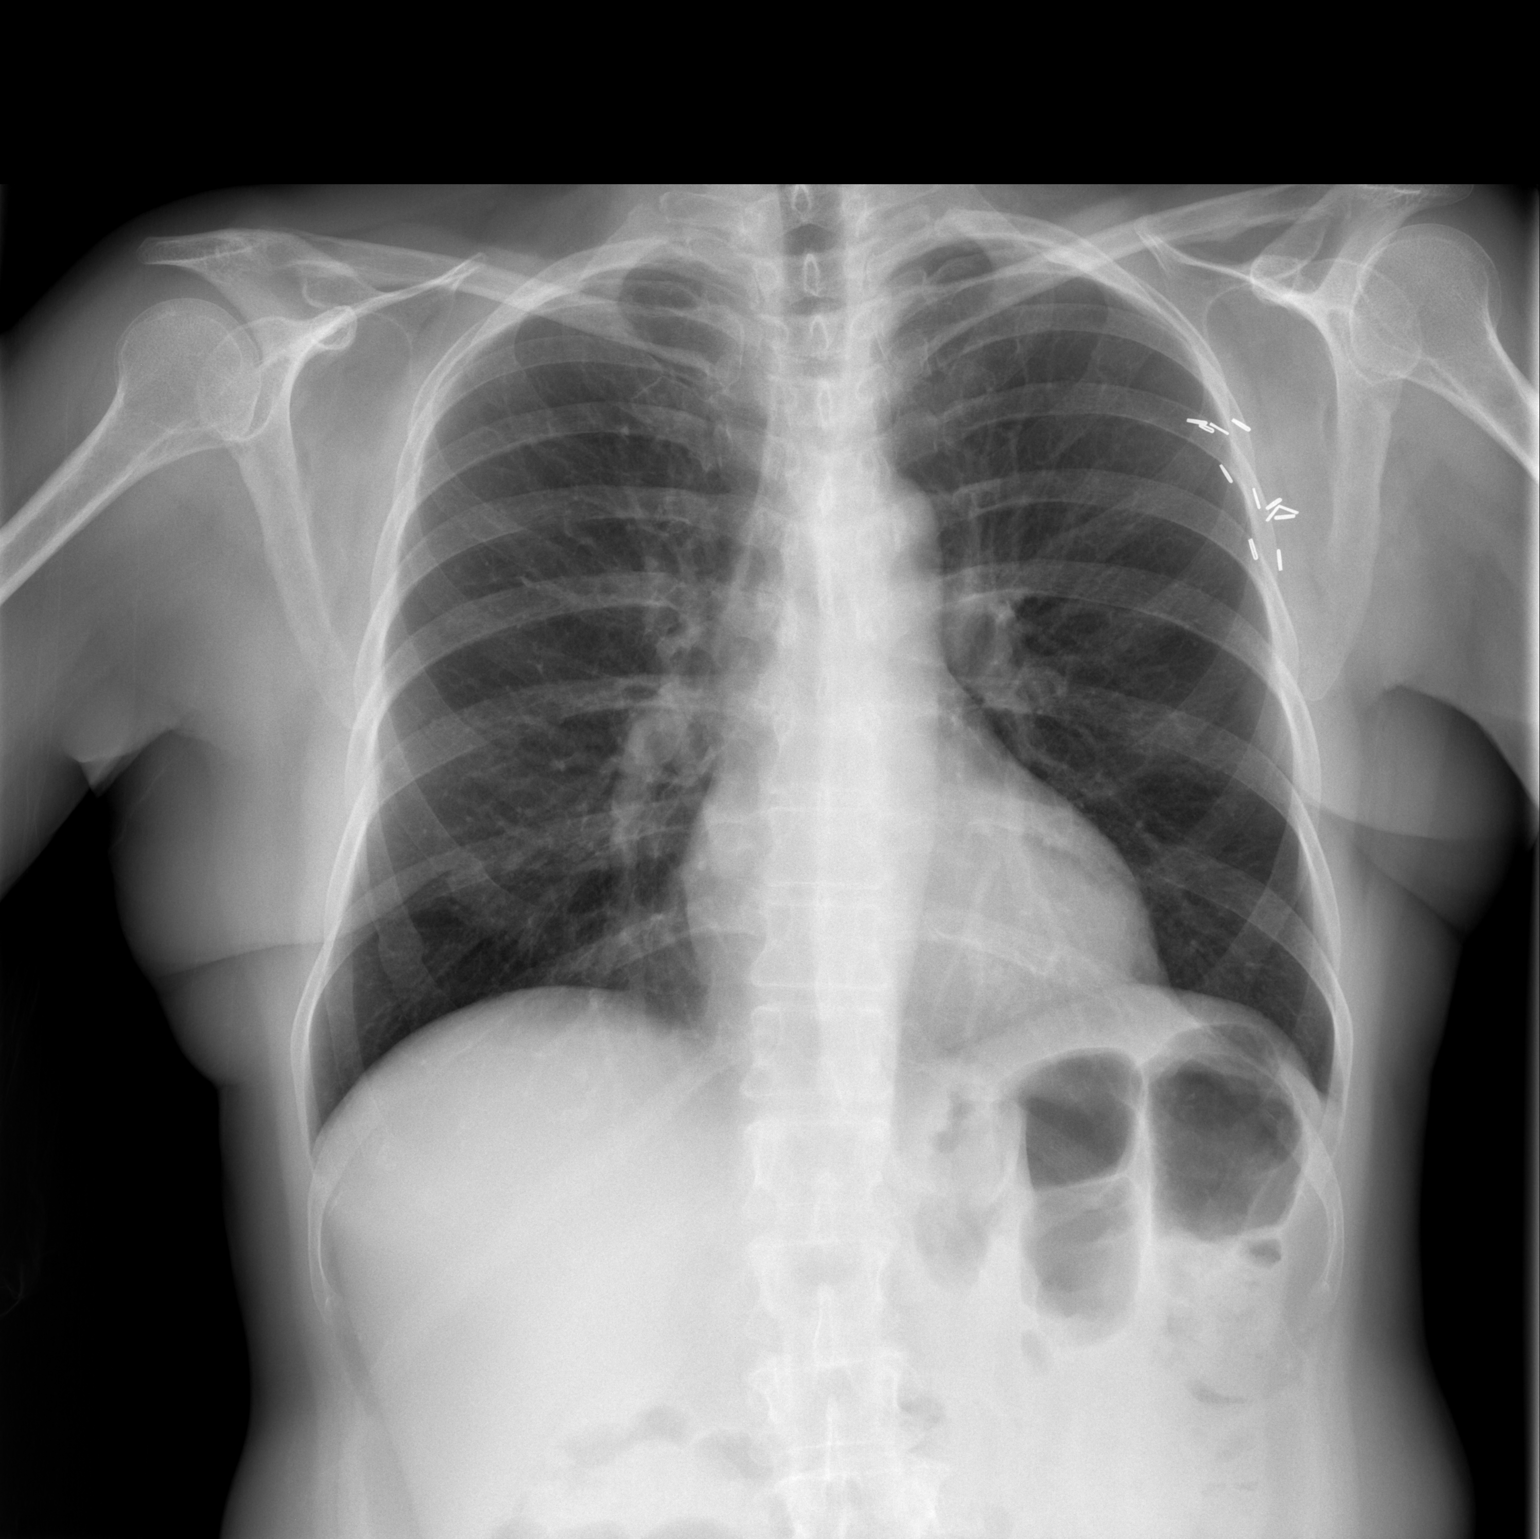

[w chest lat]
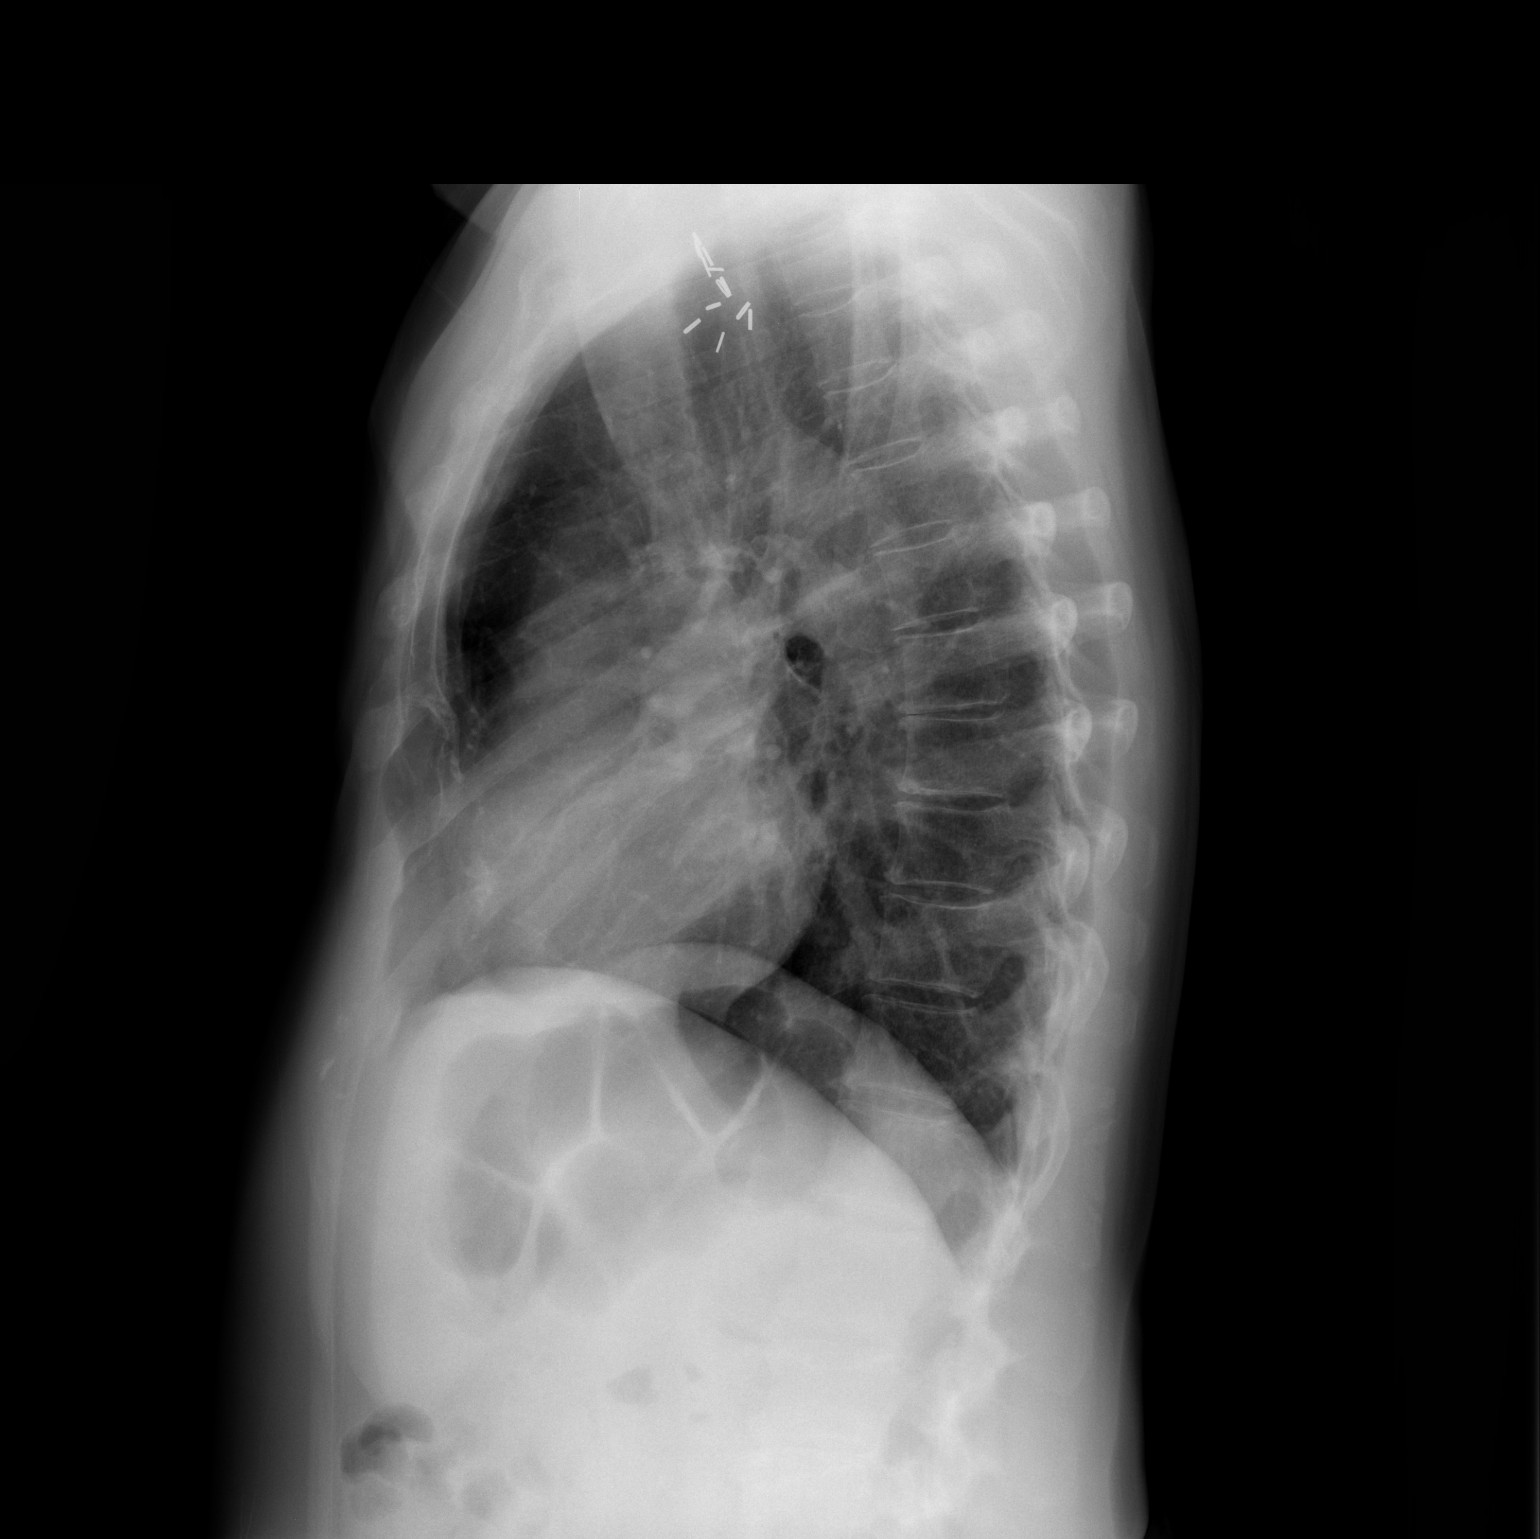

[2 of 2 positions shown; findings below may reference images not displayed]

FINDINGS: Surgical clips which project over the left lateral chest wall versus
medial axilla. Remote but interval since [DATE] anterior right
rib fractures, including at 8 through 10.

Midline trachea. Normal heart size and mediastinal contours. No
pleural effusion or pneumothorax. Clear lungs.
IMPRESSION: No acute cardiopulmonary disease.

## 2015-09-20 DIAGNOSIS — H401232 Low-tension glaucoma, bilateral, moderate stage: Secondary | ICD-10-CM | POA: Diagnosis not present

## 2015-09-20 DIAGNOSIS — H1851 Endothelial corneal dystrophy: Secondary | ICD-10-CM | POA: Diagnosis not present

## 2015-10-18 DIAGNOSIS — E7800 Pure hypercholesterolemia, unspecified: Secondary | ICD-10-CM | POA: Diagnosis not present

## 2015-10-18 DIAGNOSIS — I10 Essential (primary) hypertension: Secondary | ICD-10-CM | POA: Diagnosis not present

## 2016-02-19 DIAGNOSIS — H903 Sensorineural hearing loss, bilateral: Secondary | ICD-10-CM | POA: Diagnosis not present

## 2016-03-07 DIAGNOSIS — S93409A Sprain of unspecified ligament of unspecified ankle, initial encounter: Secondary | ICD-10-CM | POA: Diagnosis not present

## 2016-03-07 DIAGNOSIS — B029 Zoster without complications: Secondary | ICD-10-CM | POA: Diagnosis not present

## 2016-05-09 DIAGNOSIS — I10 Essential (primary) hypertension: Secondary | ICD-10-CM | POA: Diagnosis not present

## 2016-05-09 DIAGNOSIS — Z23 Encounter for immunization: Secondary | ICD-10-CM | POA: Diagnosis not present

## 2016-05-09 DIAGNOSIS — H9193 Unspecified hearing loss, bilateral: Secondary | ICD-10-CM | POA: Diagnosis not present

## 2016-05-09 DIAGNOSIS — E559 Vitamin D deficiency, unspecified: Secondary | ICD-10-CM | POA: Diagnosis not present

## 2016-05-09 DIAGNOSIS — M859 Disorder of bone density and structure, unspecified: Secondary | ICD-10-CM | POA: Diagnosis not present

## 2016-05-09 DIAGNOSIS — Z0000 Encounter for general adult medical examination without abnormal findings: Secondary | ICD-10-CM | POA: Diagnosis not present

## 2016-05-09 DIAGNOSIS — E7800 Pure hypercholesterolemia, unspecified: Secondary | ICD-10-CM | POA: Diagnosis not present

## 2016-06-11 DIAGNOSIS — H1851 Endothelial corneal dystrophy: Secondary | ICD-10-CM | POA: Diagnosis not present

## 2016-06-11 DIAGNOSIS — H401222 Low-tension glaucoma, left eye, moderate stage: Secondary | ICD-10-CM | POA: Diagnosis not present

## 2016-06-11 DIAGNOSIS — H5213 Myopia, bilateral: Secondary | ICD-10-CM | POA: Diagnosis not present

## 2016-06-11 DIAGNOSIS — H401212 Low-tension glaucoma, right eye, moderate stage: Secondary | ICD-10-CM | POA: Diagnosis not present

## 2016-06-26 DIAGNOSIS — M8588 Other specified disorders of bone density and structure, other site: Secondary | ICD-10-CM | POA: Diagnosis not present

## 2016-07-03 DIAGNOSIS — L821 Other seborrheic keratosis: Secondary | ICD-10-CM | POA: Diagnosis not present

## 2016-07-03 DIAGNOSIS — Z85828 Personal history of other malignant neoplasm of skin: Secondary | ICD-10-CM | POA: Diagnosis not present

## 2016-07-03 DIAGNOSIS — B078 Other viral warts: Secondary | ICD-10-CM | POA: Diagnosis not present

## 2016-07-03 DIAGNOSIS — L570 Actinic keratosis: Secondary | ICD-10-CM | POA: Diagnosis not present

## 2016-07-03 DIAGNOSIS — L820 Inflamed seborrheic keratosis: Secondary | ICD-10-CM | POA: Diagnosis not present

## 2016-08-28 DIAGNOSIS — L821 Other seborrheic keratosis: Secondary | ICD-10-CM | POA: Diagnosis not present

## 2016-08-28 DIAGNOSIS — D1801 Hemangioma of skin and subcutaneous tissue: Secondary | ICD-10-CM | POA: Diagnosis not present

## 2016-08-28 DIAGNOSIS — D225 Melanocytic nevi of trunk: Secondary | ICD-10-CM | POA: Diagnosis not present

## 2016-08-28 DIAGNOSIS — D224 Melanocytic nevi of scalp and neck: Secondary | ICD-10-CM | POA: Diagnosis not present

## 2016-08-28 DIAGNOSIS — Z85828 Personal history of other malignant neoplasm of skin: Secondary | ICD-10-CM | POA: Diagnosis not present

## 2016-11-14 DIAGNOSIS — Z23 Encounter for immunization: Secondary | ICD-10-CM | POA: Diagnosis not present

## 2016-11-14 DIAGNOSIS — R1030 Lower abdominal pain, unspecified: Secondary | ICD-10-CM | POA: Diagnosis not present

## 2016-11-14 DIAGNOSIS — M7070 Other bursitis of hip, unspecified hip: Secondary | ICD-10-CM | POA: Diagnosis not present

## 2017-03-03 DIAGNOSIS — H401232 Low-tension glaucoma, bilateral, moderate stage: Secondary | ICD-10-CM | POA: Diagnosis not present

## 2017-05-15 DIAGNOSIS — Z23 Encounter for immunization: Secondary | ICD-10-CM | POA: Diagnosis not present

## 2017-05-15 DIAGNOSIS — J029 Acute pharyngitis, unspecified: Secondary | ICD-10-CM | POA: Diagnosis not present

## 2017-05-22 DIAGNOSIS — Z0000 Encounter for general adult medical examination without abnormal findings: Secondary | ICD-10-CM | POA: Diagnosis not present

## 2017-05-22 DIAGNOSIS — B070 Plantar wart: Secondary | ICD-10-CM | POA: Diagnosis not present

## 2017-05-22 DIAGNOSIS — E7800 Pure hypercholesterolemia, unspecified: Secondary | ICD-10-CM | POA: Diagnosis not present

## 2017-05-22 DIAGNOSIS — M859 Disorder of bone density and structure, unspecified: Secondary | ICD-10-CM | POA: Diagnosis not present

## 2017-05-22 DIAGNOSIS — E559 Vitamin D deficiency, unspecified: Secondary | ICD-10-CM | POA: Diagnosis not present

## 2017-05-22 DIAGNOSIS — I10 Essential (primary) hypertension: Secondary | ICD-10-CM | POA: Diagnosis not present

## 2017-05-22 DIAGNOSIS — Z23 Encounter for immunization: Secondary | ICD-10-CM | POA: Diagnosis not present

## 2017-05-22 DIAGNOSIS — H9193 Unspecified hearing loss, bilateral: Secondary | ICD-10-CM | POA: Diagnosis not present

## 2017-09-16 DIAGNOSIS — H5213 Myopia, bilateral: Secondary | ICD-10-CM | POA: Diagnosis not present

## 2017-09-16 DIAGNOSIS — H1851 Endothelial corneal dystrophy: Secondary | ICD-10-CM | POA: Diagnosis not present

## 2017-09-16 DIAGNOSIS — H2513 Age-related nuclear cataract, bilateral: Secondary | ICD-10-CM | POA: Diagnosis not present

## 2017-09-16 DIAGNOSIS — H401232 Low-tension glaucoma, bilateral, moderate stage: Secondary | ICD-10-CM | POA: Diagnosis not present

## 2017-10-27 DIAGNOSIS — D225 Melanocytic nevi of trunk: Secondary | ICD-10-CM | POA: Diagnosis not present

## 2017-10-27 DIAGNOSIS — L570 Actinic keratosis: Secondary | ICD-10-CM | POA: Diagnosis not present

## 2017-10-27 DIAGNOSIS — L814 Other melanin hyperpigmentation: Secondary | ICD-10-CM | POA: Diagnosis not present

## 2017-10-27 DIAGNOSIS — L821 Other seborrheic keratosis: Secondary | ICD-10-CM | POA: Diagnosis not present

## 2017-10-27 DIAGNOSIS — D1801 Hemangioma of skin and subcutaneous tissue: Secondary | ICD-10-CM | POA: Diagnosis not present

## 2017-11-18 DIAGNOSIS — I10 Essential (primary) hypertension: Secondary | ICD-10-CM | POA: Diagnosis not present

## 2018-02-05 DIAGNOSIS — R42 Dizziness and giddiness: Secondary | ICD-10-CM | POA: Diagnosis not present

## 2018-02-11 DIAGNOSIS — H8113 Benign paroxysmal vertigo, bilateral: Secondary | ICD-10-CM | POA: Diagnosis not present

## 2018-02-18 DIAGNOSIS — H8113 Benign paroxysmal vertigo, bilateral: Secondary | ICD-10-CM | POA: Diagnosis not present

## 2018-03-24 DIAGNOSIS — H401232 Low-tension glaucoma, bilateral, moderate stage: Secondary | ICD-10-CM | POA: Diagnosis not present

## 2018-03-24 DIAGNOSIS — H1851 Endothelial corneal dystrophy: Secondary | ICD-10-CM | POA: Diagnosis not present

## 2018-06-30 DIAGNOSIS — Z1389 Encounter for screening for other disorder: Secondary | ICD-10-CM | POA: Diagnosis not present

## 2018-06-30 DIAGNOSIS — E7800 Pure hypercholesterolemia, unspecified: Secondary | ICD-10-CM | POA: Diagnosis not present

## 2018-06-30 DIAGNOSIS — E559 Vitamin D deficiency, unspecified: Secondary | ICD-10-CM | POA: Diagnosis not present

## 2018-06-30 DIAGNOSIS — H9193 Unspecified hearing loss, bilateral: Secondary | ICD-10-CM | POA: Diagnosis not present

## 2018-06-30 DIAGNOSIS — M859 Disorder of bone density and structure, unspecified: Secondary | ICD-10-CM | POA: Diagnosis not present

## 2018-06-30 DIAGNOSIS — Z0000 Encounter for general adult medical examination without abnormal findings: Secondary | ICD-10-CM | POA: Diagnosis not present

## 2018-06-30 DIAGNOSIS — I10 Essential (primary) hypertension: Secondary | ICD-10-CM | POA: Diagnosis not present

## 2018-06-30 DIAGNOSIS — Z23 Encounter for immunization: Secondary | ICD-10-CM | POA: Diagnosis not present

## 2018-07-01 ENCOUNTER — Other Ambulatory Visit: Payer: Self-pay | Admitting: Family Medicine

## 2018-07-01 DIAGNOSIS — M8580 Other specified disorders of bone density and structure, unspecified site: Secondary | ICD-10-CM

## 2018-08-20 ENCOUNTER — Ambulatory Visit
Admission: RE | Admit: 2018-08-20 | Discharge: 2018-08-20 | Disposition: A | Discharge status: Home / Self Care | Payer: Medicare Other | Source: Ambulatory Visit | Attending: Family Medicine | Admitting: Family Medicine

## 2018-08-20 DIAGNOSIS — M85852 Other specified disorders of bone density and structure, left thigh: Secondary | ICD-10-CM | POA: Diagnosis not present

## 2018-08-20 DIAGNOSIS — Z780 Asymptomatic menopausal state: Secondary | ICD-10-CM | POA: Diagnosis not present

## 2018-08-20 DIAGNOSIS — M8580 Other specified disorders of bone density and structure, unspecified site: Secondary | ICD-10-CM

## 2018-09-01 DIAGNOSIS — E559 Vitamin D deficiency, unspecified: Secondary | ICD-10-CM | POA: Diagnosis not present

## 2018-09-01 DIAGNOSIS — M859 Disorder of bone density and structure, unspecified: Secondary | ICD-10-CM | POA: Diagnosis not present

## 2018-09-01 DIAGNOSIS — I10 Essential (primary) hypertension: Secondary | ICD-10-CM | POA: Diagnosis not present

## 2018-09-01 DIAGNOSIS — M810 Age-related osteoporosis without current pathological fracture: Secondary | ICD-10-CM | POA: Diagnosis not present

## 2018-09-23 DIAGNOSIS — H1851 Endothelial corneal dystrophy: Secondary | ICD-10-CM | POA: Diagnosis not present

## 2018-09-23 DIAGNOSIS — H2513 Age-related nuclear cataract, bilateral: Secondary | ICD-10-CM | POA: Diagnosis not present

## 2018-09-23 DIAGNOSIS — H524 Presbyopia: Secondary | ICD-10-CM | POA: Diagnosis not present

## 2018-09-23 DIAGNOSIS — H401232 Low-tension glaucoma, bilateral, moderate stage: Secondary | ICD-10-CM | POA: Diagnosis not present

## 2018-09-25 ENCOUNTER — Encounter (HOSPITAL_COMMUNITY): Payer: Self-pay | Admitting: Emergency Medicine

## 2018-09-25 ENCOUNTER — Emergency Department (HOSPITAL_COMMUNITY)
Admission: EM | Admit: 2018-09-25 | Discharge: 2018-09-25 | Disposition: A | Discharge status: Home / Self Care | Payer: Medicare Other | Attending: Emergency Medicine | Admitting: Emergency Medicine

## 2018-09-25 ENCOUNTER — Emergency Department (HOSPITAL_COMMUNITY): Payer: Medicare Other

## 2018-09-25 ENCOUNTER — Other Ambulatory Visit: Payer: Self-pay

## 2018-09-25 DIAGNOSIS — Z87891 Personal history of nicotine dependence: Secondary | ICD-10-CM | POA: Insufficient documentation

## 2018-09-25 DIAGNOSIS — Z79899 Other long term (current) drug therapy: Secondary | ICD-10-CM | POA: Insufficient documentation

## 2018-09-25 DIAGNOSIS — R079 Chest pain, unspecified: Secondary | ICD-10-CM | POA: Diagnosis not present

## 2018-09-25 DIAGNOSIS — R0789 Other chest pain: Secondary | ICD-10-CM | POA: Diagnosis not present

## 2018-09-25 DIAGNOSIS — I10 Essential (primary) hypertension: Secondary | ICD-10-CM | POA: Diagnosis not present

## 2018-09-25 DIAGNOSIS — Z853 Personal history of malignant neoplasm of breast: Secondary | ICD-10-CM | POA: Insufficient documentation

## 2018-09-25 LAB — I-STAT TROPONIN, ED
Troponin i, poc: 0 ng/mL (ref 0.00–0.08)
Troponin i, poc: 0.01 ng/mL (ref 0.00–0.08)

## 2018-09-25 LAB — BASIC METABOLIC PANEL
Anion gap: 9 (ref 5–15)
BUN: 8 mg/dL (ref 8–23)
CALCIUM: 10.2 mg/dL (ref 8.9–10.3)
CO2: 26 mmol/L (ref 22–32)
Chloride: 99 mmol/L (ref 98–111)
Creatinine, Ser: 0.63 mg/dL (ref 0.44–1.00)
GFR calc Af Amer: >60 mL/min (ref >60)
GFR calc non Af Amer: >60 mL/min (ref >60)
Glucose, Bld: 113 mg/dL — ABNORMAL HIGH (ref 70–99)
Potassium: 4.6 mmol/L (ref 3.5–5.1)
Sodium: 134 mmol/L — ABNORMAL LOW (ref 135–145)

## 2018-09-25 LAB — CBC
HCT: 46.4 % — ABNORMAL HIGH (ref 36.0–46.0)
Hemoglobin: 15.5 g/dL — ABNORMAL HIGH (ref 12.0–15.0)
MCH: 30.2 pg (ref 26.0–34.0)
MCHC: 33.4 g/dL (ref 30.0–36.0)
MCV: 90.3 fL (ref 80.0–100.0)
Platelets: 342 10*3/uL (ref 150–400)
RBC: 5.14 MIL/uL — ABNORMAL HIGH (ref 3.87–5.11)
RDW: 12.6 % (ref 11.5–15.5)
WBC: 8.6 10*3/uL (ref 4.0–10.5)
nRBC: 0 % (ref 0.0–0.2)

## 2018-09-25 LAB — HEPATIC FUNCTION PANEL
ALK PHOS: 48 U/L (ref 38–126)
ALT: 33 U/L (ref 0–44)
AST: 32 U/L (ref 15–41)
Albumin: 4.4 g/dL (ref 3.5–5.0)
Bilirubin, Direct: 0.1 mg/dL (ref 0.0–0.2)
Indirect Bilirubin: 0.6 mg/dL (ref 0.3–0.9)
Total Bilirubin: 0.7 mg/dL (ref 0.3–1.2)
Total Protein: 7.5 g/dL (ref 6.5–8.1)

## 2018-09-25 LAB — LIPASE, BLOOD: Lipase: 37 U/L (ref 11–51)

## 2018-09-25 LAB — D-DIMER, QUANTITATIVE: D-Dimer, Quant: 0.39 ug/mL-FEU (ref 0.00–0.50)

## 2018-09-25 IMAGING — CR DG CHEST 2V
2 series · 2 of 2 positions shown · non-contrast
Comparison: [DATE]

CLINICAL DATA: Hypertension, chest pain, and headache.

EXAM:
CHEST - 2 VIEW

[chest pa]
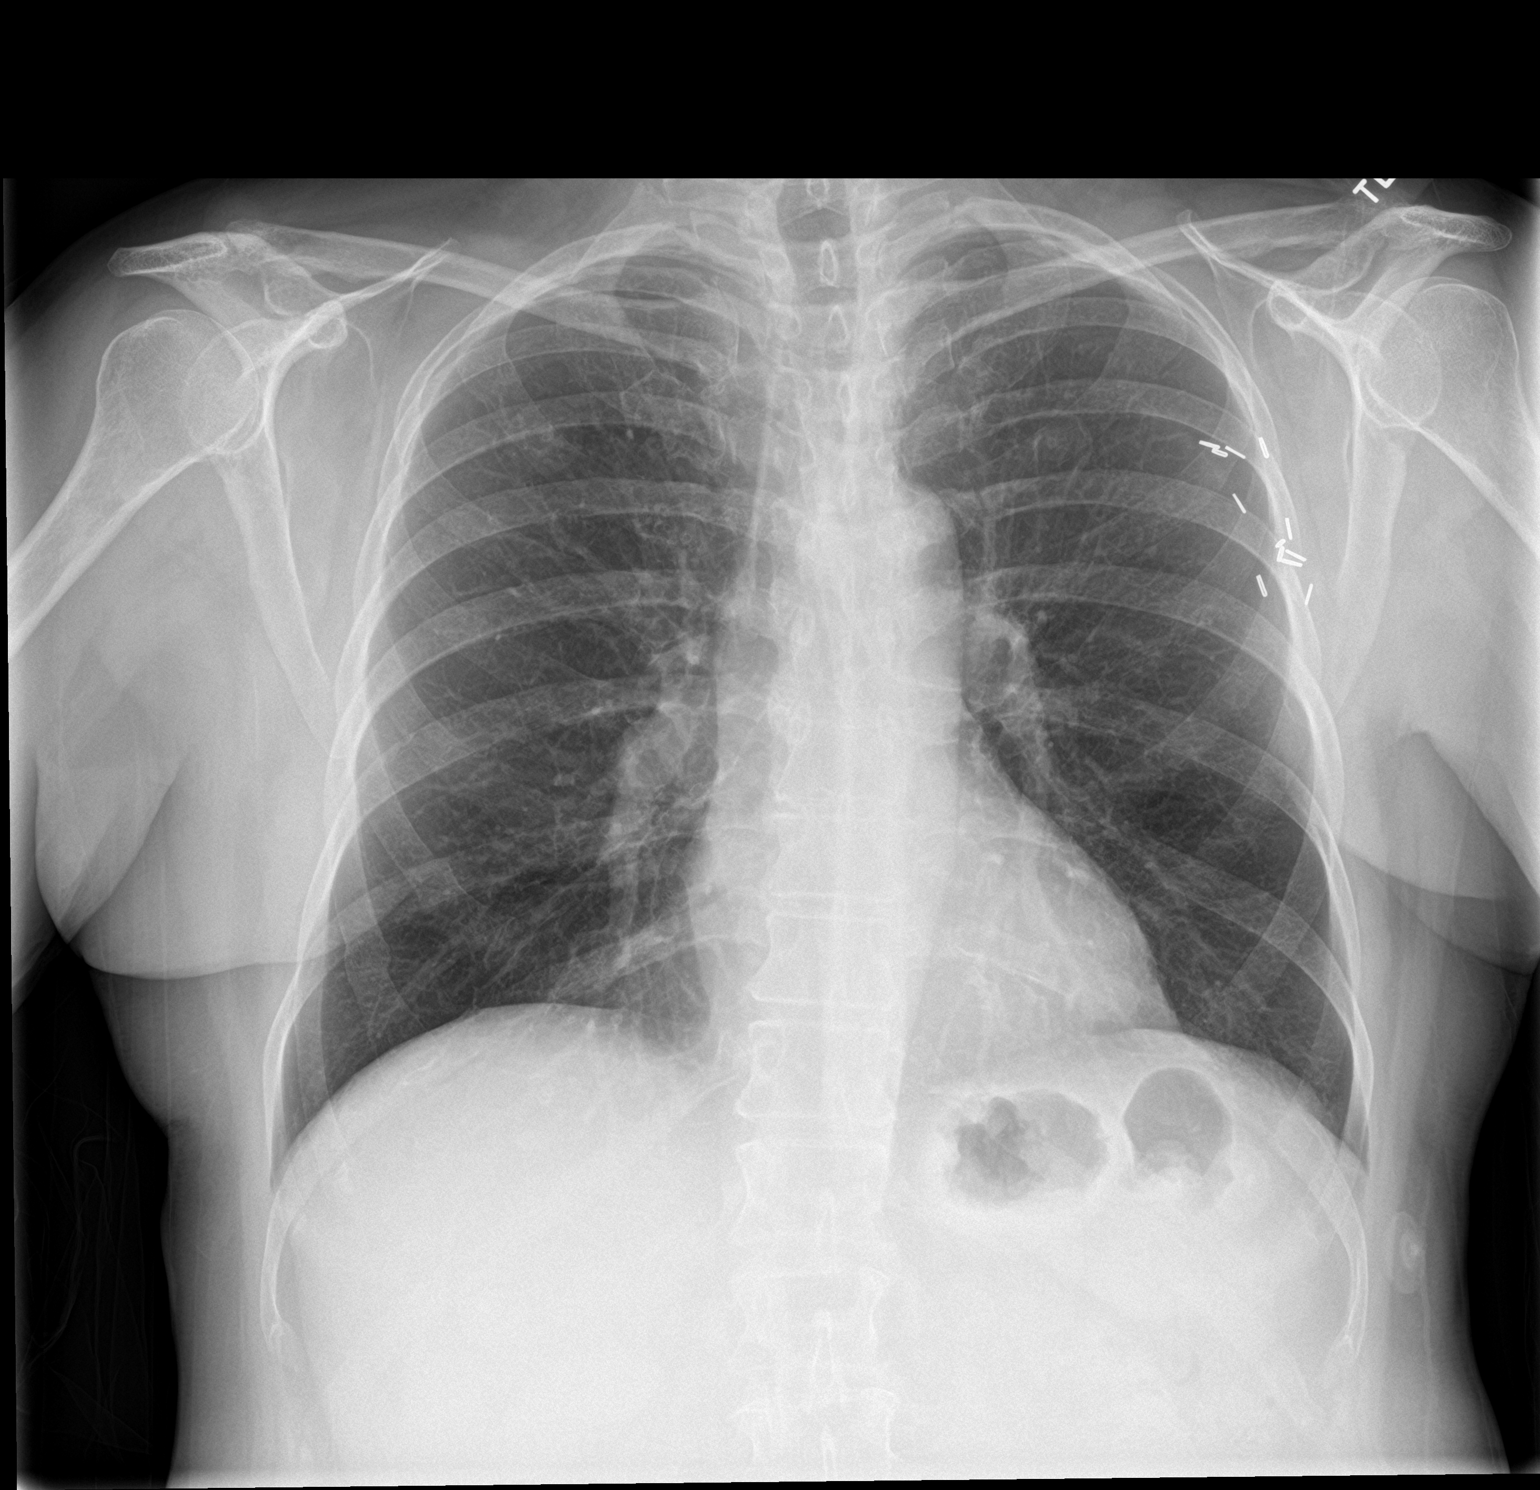

[chest lat]
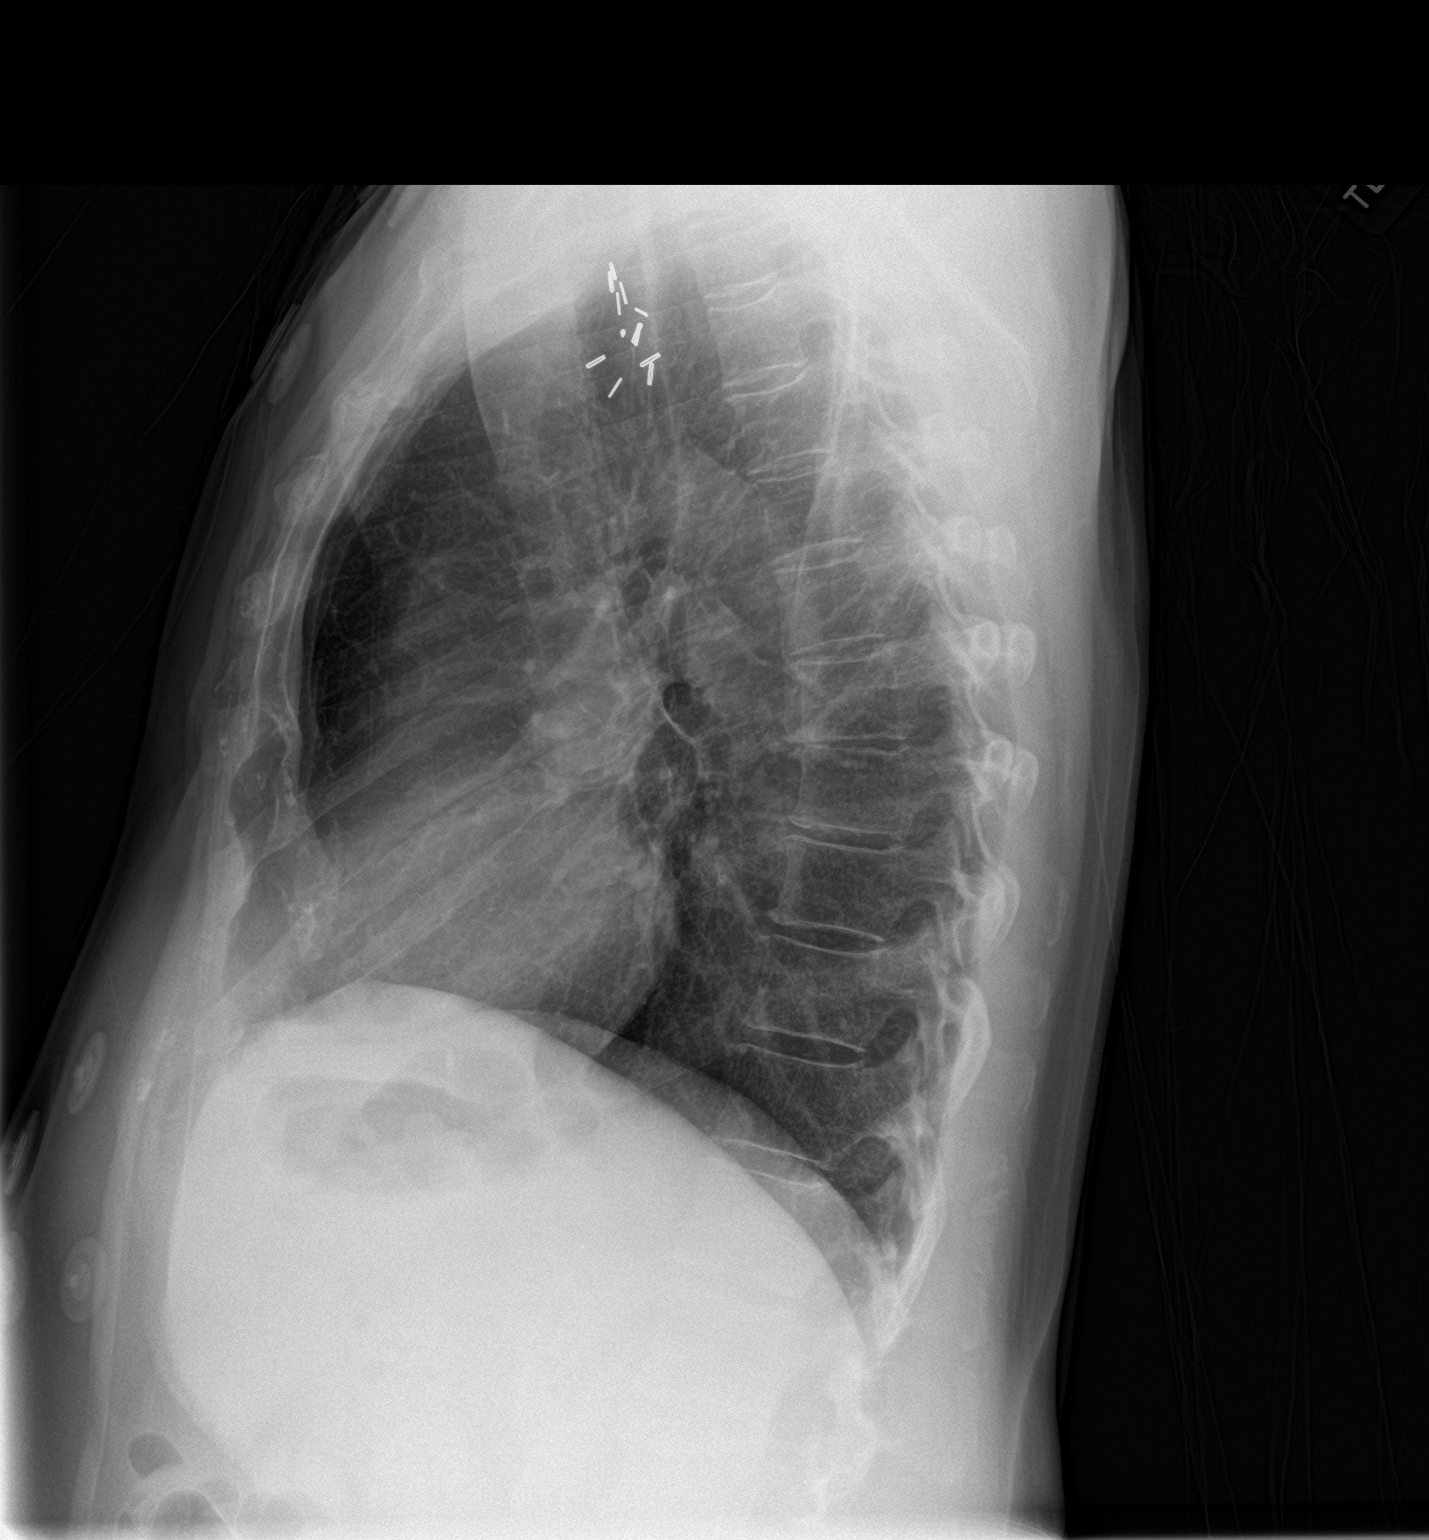

[2 of 2 positions shown; findings below may reference images not displayed]

FINDINGS: The cardiomediastinal silhouette is unchanged and within normal
limits. Surgical clips are noted over the left axilla/lateral chest
wall. The lungs are clear. No pleural effusion or pneumothorax is
identified. Old right rib fractures are again noted.
IMPRESSION: No active cardiopulmonary disease.

## 2018-09-25 MED ORDER — SODIUM CHLORIDE 0.9% FLUSH: 3.0000 mL | Freq: Once | INTRAVENOUS | Status: DC

## 2018-09-25 NOTE — Discharge Instructions (Signed)
Call [facility] Monday to get rechecked next week.  Do not do any strenuous activity.  If you have discomfort in your chest again that will not go away quickly then you should return to the emergency department

## 2018-09-25 NOTE — ED Triage Notes (Signed)
Pt arrives by pov from [place] [facility] to rule out cardiac cp. Pt had pressure in chest and back that has since resolved.

## 2018-09-25 NOTE — ED Notes (Signed)
Patient verbalizes understanding of discharge instructions. Opportunity for questioning and answers were provided. Armband removed by staff, pt discharged from ED.  

## 2018-09-25 NOTE — ED Provider Notes (Signed)
[facility] EMERGENCY DEPARTMENT Provider Note   CSN: [id number] Arrival date & time: 09/25/18  1411    History   Chief Complaint Chief Complaint  Patient presents with  . Chest Pain    HPI Sandra Becker is a 78 y.o. female.     Patient states that she was in the bookstore looking at some books and she started have some chest pressure.  It lasted about an hour.  Patient feels fine now she had no shortness of breath no sweating.  Patient does not have a history of coronary artery disease and does aerobics that are fairly strenuous and does not have any symptoms  The history is provided by the patient. No language interpreter was used.  Chest Pain  Pain location:  Substernal area Pain quality: aching   Pain radiates to:  Does not radiate Pain severity:  Moderate Onset quality:  Sudden Duration:  60 minutes Timing:  Rare Progression:  Resolved Chronicity:  New Context: not breathing   Relieved by:  Nothing Worsened by:  Nothing Ineffective treatments:  None tried Associated symptoms: no abdominal pain, no back pain, no cough, no fatigue and no headache     Past Medical History:  Diagnosis Date  . Breast cancer ([place]) 1993   bilateral mastectomy; no chemo or radiation  . Glaucoma   . Hyperlipidemia   . Hypertension     There are no active problems to display for this patient.   Past Surgical History:  Procedure Laterality Date  . MASTECTOMY Bilateral 1993  . TONSILLECTOMY AND ADENOIDECTOMY  1947  . TUBAL LIGATION  1974     OB History   No obstetric history on file.      Home Medications    Prior to Admission medications   Medication Sig Start Date End Date Taking? Authorizing Provider  brinzolamide (AZOPT) 1 % ophthalmic suspension Place 1 drop into both eyes 2 (two) times daily.    Yes [provider]  latanoprost (XALATAN) 0.005 % ophthalmic solution Place 1 drop into both eyes at bedtime.   Yes [provider]  losartan (COZAAR) 50 MG tablet Take 50 mg by mouth daily.   Yes [provider]    Family History Family History  Problem Relation Age of Onset  . Colon cancer Neg Hx     Social History Social History   Tobacco Use  . Smoking status: Former Smoker    Last attempt to quit: 07/30/1963    Years since quitting: 55.1  . Smokeless tobacco: Never Used  Substance Use Topics  . Alcohol use: Yes    Alcohol/week: 21.0 standard drinks    Types: 21 Glasses of wine per week  . Drug use: No     Allergies   Crab [shellfish allergy] and Fosamax [alendronate sodium]   Review of Systems Review of Systems  Constitutional: Negative for appetite change and fatigue.  HENT: Negative for congestion, ear discharge and sinus pressure.   Eyes: Negative for discharge.  Respiratory: Negative for cough.   Cardiovascular: Positive for chest pain.  Gastrointestinal: Negative for abdominal pain and diarrhea.  Genitourinary: Negative for frequency and hematuria.  Musculoskeletal: Negative for back pain.  Skin: Negative for rash.  Neurological: Negative for seizures and headaches.  Psychiatric/Behavioral: Negative for hallucinations.     Physical Exam Updated Vital Signs BP (!) 159/61   Pulse 77   Temp 97.8 F (36.6 C) (Oral)   Resp 19   Ht 5\' 5"  (1.651  m)   Wt 68 kg   SpO2 96%   BMI 24.96 kg/m   Physical Exam Vitals signs and nursing note reviewed.  Constitutional:      Appearance: She is well-developed.  HENT:     Head: Normocephalic.     Nose: Nose normal.  Eyes:     General: No scleral icterus.    Conjunctiva/sclera: Conjunctivae normal.  Neck:     Musculoskeletal: Neck supple.     Thyroid: No thyromegaly.  Cardiovascular:     Rate and Rhythm: Normal rate and regular rhythm.     Heart sounds: No murmur. No friction rub. No gallop.   Pulmonary:     Breath sounds: No stridor. No wheezing or rales.  Chest:     Chest wall: No tenderness.  Abdominal:     General:  There is no distension.     Tenderness: There is no abdominal tenderness. There is no rebound.  Musculoskeletal: Normal range of motion.  Lymphadenopathy:     Cervical: No cervical adenopathy.  Skin:    Findings: No erythema or rash.  Neurological:     Mental Status: She is oriented to person, place, and time.     Motor: No abnormal muscle tone.     Coordination: Coordination normal.  Psychiatric:        Behavior: Behavior normal.      ED Treatments / Results  Labs (all labs ordered are listed, but only abnormal results are displayed) Labs Reviewed  BASIC METABOLIC PANEL - Abnormal; Notable for the following components:      Result Value   Sodium 134 (*)    Glucose, Bld 113 (*)    All other components within normal limits  CBC - Abnormal; Notable for the following components:   RBC 5.14 (*)    Hemoglobin 15.5 (*)    HCT 46.4 (*)    All other components within normal limits  HEPATIC FUNCTION PANEL  LIPASE, BLOOD  D-DIMER, QUANTITATIVE (NOT AT [facility])  I-STAT TROPONIN, ED  I-STAT TROPONIN, ED    EKG EKG Interpretation  Date/Time:  Friday September 25 2018 14:19:33 EST Ventricular Rate:  76 PR Interval:  138 QRS Duration: 80 QT Interval:  378 QTC Calculation: 425 R Axis:   -2 Text Interpretation:  Normal sinus rhythm with sinus arrhythmia Anterior infarct , age undetermined Abnormal ECG Confirmed by Milton Ferguson [phone]) on 09/25/2018 5:55:21 PM Also confirmed by Milton Ferguson [phone])  on 09/25/2018 7:30:44 PM   Radiology Dg Chest 2 View  Result Date: 09/25/2018 CLINICAL DATA:  Hypertension, chest pain, and headache. EXAM: CHEST - 2 VIEW COMPARISON:  11/11/2013 FINDINGS: The cardiomediastinal silhouette is unchanged and within normal limits. Surgical clips are noted over the left axilla/lateral chest wall. The lungs are clear. No pleural effusion or pneumothorax is identified. Old right rib fractures are again noted. IMPRESSION: No active cardiopulmonary disease.  Electronically Signed   By: Logan Bores M.D.   On: 09/25/2018 15:04    Procedures Procedures (including critical care time)  Medications Ordered in ED Medications  sodium chloride flush (NS) 0.9 % injection 3 mL (has no administration in time range)     Initial Impression / Assessment and Plan / ED Course  I have reviewed the triage vital signs and the nursing notes.  Pertinent labs & imaging results that were available during my care of the patient were reviewed by me and considered in my medical decision making (see chart for details).   Patient has had  2 troponins that were normal chemistries and CBC unremarkable.  D-dimer negative.  Chest x-ray unremarkable.  EKG shows no acute changes.  Patient has been asymptomatic in the emergency department.  She will take a baby aspirin a day and is referred to cardiology.  She was told to call cardiology Monday to set up an appointment this week for possible stress test.  Also patient was told if she has any more symptoms that do not disappear over a short period time.  Then she should return to the emergency department for further evaluation       Final Clinical Impressions(s) / ED Diagnoses   Final diagnoses:  Atypical chest pain    ED Discharge Orders    None       Milton Ferguson, MD 09/25/18 1945

## 2018-09-25 NOTE — ED Notes (Signed)
Recent change in meds. Pt was taking atenolol for "a number of years" and then was switched to lisinopril since the atenolol "was not stabilizing the BP." She was then switched to Losartan since the Lisinopril caused a cough.

## 2018-09-28 ENCOUNTER — Encounter: Payer: Self-pay | Admitting: *Deleted

## 2018-09-28 ENCOUNTER — Ambulatory Visit (INDEPENDENT_AMBULATORY_CARE_PROVIDER_SITE_OTHER): Payer: Medicare Other | Admitting: Cardiology

## 2018-09-28 ENCOUNTER — Encounter: Payer: Self-pay | Admitting: Cardiology

## 2018-09-28 VITALS — BP 160/72 | HR 88 | Ht 65.0 in | Wt 144.8 lb

## 2018-09-28 DIAGNOSIS — I10 Essential (primary) hypertension: Secondary | ICD-10-CM

## 2018-09-28 DIAGNOSIS — R079 Chest pain, unspecified: Secondary | ICD-10-CM | POA: Diagnosis not present

## 2018-09-28 DIAGNOSIS — I700 Atherosclerosis of aorta: Secondary | ICD-10-CM | POA: Diagnosis not present

## 2018-09-28 NOTE — Patient Instructions (Signed)
Medication Instructions:  The current medical regimen is effective;  continue present plan and medications.  If you need a refill on your cardiac medications before your next appointment, please call your pharmacy.   Testing/Procedures: Your physician has requested that you have a myoview. For further information please visit [email or web address]. Please follow instruction sheet, as given.  Follow-Up: Follow up will be based on the results of the above.  Thank you for choosing [facility]!!

## 2018-09-28 NOTE — Progress Notes (Signed)
Cardiology Office Note:    Date:  09/28/2018   ID:  Sandra Becker, DOB 1940-09-13, MRN [id number]  PCP:  Kelton Pillar, MD  Cardiologist:  Candee Furbish, MD  Electrophysiologist:  None   Referring MD: Kelton Pillar, MD     History of Present Illness:    Sandra Becker is a 78 y.o. female pain at the request of Dr. Laurann Montana.  She was seen in the emergency room on 09/25/2018 with chest discomfort.  She was in a bookstore when she started having some chest pressures.  Lasted about 1 hour duration.  Tightness areound chest. [facility]. Mild HA, HA, BP 200/100 at first. No associated shortness of breath fevers chills nausea vomiting syncope.  No prior history of CAD.  Usually participates in aerobics and does not have any difficulties.  She smoked several years ago quit in 1965.  ?Father, Mother with No CAD.  Troponin was normal.  Hemoglobin 15.  Number negative.  Chest x-ray negative.  EKG personally reviewed shows sinus rhythm without any ischemic changes.  When asked about esophageal dysmotility, she does state that sometimes she feels like her Fosamax pill may get stuck in her upper throat.  She sometimes feels like she has to swallow hard to get food down at times.  This is not typical however.  She had an ACE inhibitor cough.  Overall, she has not any further chest discomfort since that experience.  Upcoming cataract surgery.  Past Medical History:  Diagnosis Date  . Breast cancer ([place]) 1993   bilateral mastectomy; no chemo or radiation  . Glaucoma   . Hyperlipidemia   . Hypertension     Past Surgical History:  Procedure Laterality Date  . MASTECTOMY Bilateral 1993  . TONSILLECTOMY AND ADENOIDECTOMY  1947  . TUBAL LIGATION  1974    Current Medications: Current Meds  Medication Sig  . brinzolamide (AZOPT) 1 % ophthalmic suspension Place 1 drop into both eyes 2 (two) times daily.   Marland Kitchen latanoprost (XALATAN) 0.005 % ophthalmic solution Place 1 drop into both eyes at  bedtime.  Marland Kitchen losartan (COZAAR) 50 MG tablet Take 50 mg by mouth daily.     Allergies:   Crab [shellfish allergy]; Fosamax [alendronate sodium]; and Ace inhibitors   Social History   Socioeconomic History  . Marital status: Married    Spouse name: Not on file  . Number of children: Not on file  . Years of education: Not on file  . Highest education level: Not on file  Occupational History  . Not on file  Social Needs  . Financial resource strain: Not on file  . Food insecurity:    Worry: Not on file    Inability: Not on file  . Transportation needs:    Medical: Not on file    Non-medical: Not on file  Tobacco Use  . Smoking status: Former Smoker    Last attempt to quit: 07/30/1963    Years since quitting: 55.2  . Smokeless tobacco: Never Used  Substance and Sexual Activity  . Alcohol use: Yes    Alcohol/week: 21.0 standard drinks    Types: 21 Glasses of wine per week  . Drug use: No  . Sexual activity: Not on file  Lifestyle  . Physical activity:    Days per week: Not on file    Minutes per session: Not on file  . Stress: Not on file  Relationships  . Social connections:    Talks on phone: Not on file  Gets together: Not on file    Attends religious service: Not on file    Active member of club or organization: Not on file    Attends meetings of clubs or organizations: Not on file    Relationship status: Not on file  Other Topics Concern  . Not on file  Social History Narrative  . Not on file     Family History: The patient's family history is negative for Colon cancer.  ROS:   Please see the history of present illness.     All other systems reviewed and are negative.  EKGs/Labs/Other Studies Reviewed:    The following studies were reviewed today: ER notes lab work EKG  EKG:  EKG is not ordered today.  Prior normal  Recent Labs: 09/25/2018: ALT 33; BUN 8; Creatinine, Ser 0.63; Hemoglobin 15.5; Platelets 342; Potassium 4.6; Sodium 134  Recent Lipid  Panel No results found for: CHOL, TRIG, HDL, CHOLHDL, VLDL, LDLCALC, LDLDIRECT  Physical Exam:    VS:  BP (!) 160/72   Pulse 88   Ht 5\' 5"  (1.651 m)   Wt 144 lb 12.8 oz (65.7 kg)   SpO2 98%   BMI 24.10 kg/m     Wt Readings from Last 3 Encounters:  09/28/18 144 lb 12.8 oz (65.7 kg)  09/25/18 150 lb (68 kg)  05/10/13 141 lb (64 kg)     GEN:  Well nourished, well developed in no acute distress HEENT: Normal, red eyeglasses NECK: No JVD; No carotid bruits LYMPHATICS: No lymphadenopathy CARDIAC: RRR, no murmurs, rubs, gallops RESPIRATORY:  Clear to auscultation without rales, wheezing or rhonchi  ABDOMEN: Soft, non-tender, non-distended MUSCULOSKELETAL:  No edema; No deformity  SKIN: Warm and dry NEUROLOGIC:  Alert and oriented x 3 PSYCHIATRIC:  Normal affect   ASSESSMENT:    1. Chest pain, unspecified type   2. Aortic atherosclerosis ([place])   3. Essential hypertension    PLAN:    In order of problems listed above:  Chest pain - We will go ahead and proceed with nuclear stress test for further stratification.  Risk of adverse events are very low 1 in 10,000. - On the differential, musculoskeletal discomfort, GI discomfort, she did mention that she had some esophageal dysmotility rarely. Continue with aggressive risk factor prevention.  Essential hypertension - Recent ACE inhibitor cough -Now on losartan.  Blood pressure still elevated today.  She is working with Dr. Kelton Pillar on her blood pressure.  May need dual agent.  Important for risk factor modification. - Check lipid baseline if not already done.  Aortic atherosclerosis - I went back and looked at a CT scan that she had of her chest in 2007 and there was mild aortic arch atherosclerosis noted.  Her cholesterol has been in the 220 range she states.  I would suggest that besides diet and exercise, that she consider statin therapy to help minimize the progression of her atherosclerosis process.  Discussed this  with her.  There were concerns about potential side effects.  There was no evidence of coronary artery calcification back then in 2007.  Should be fine to proceed with cataract surgery  Low risk surgery.   Medication Adjustments/Labs and Tests Ordered: Current medicines are reviewed at length with the patient today.  Concerns regarding medicines are outlined above.  Orders Placed This Encounter  Procedures  . MYOCARDIAL PERFUSION IMAGING   No orders of the defined types were placed in this encounter.   Patient Instructions  Medication Instructions:  The current medical regimen is effective;  continue present plan and medications.  If you need a refill on your cardiac medications before your next appointment, please call your pharmacy.   Testing/Procedures: Your physician has requested that you have a myoview. For further information please visit [email or web address]. Please follow instruction sheet, as given.  Follow-Up: Follow up will be based on the results of the above.  Thank you for choosing [facility]!!        Signed, Candee Furbish, MD  09/28/2018 11:38 AM    [place]

## 2018-09-29 ENCOUNTER — Telehealth (HOSPITAL_COMMUNITY): Payer: Self-pay | Admitting: *Deleted

## 2018-09-29 NOTE — Telephone Encounter (Signed)
Patient given detailed instructions per Myocardial Perfusion Study Information Sheet for the test on 09/30/18. Patient notified to arrive 15 minutes early and that it is imperative to arrive on time for appointment to keep from having the test rescheduled.  If you need to cancel or reschedule your appointment, please call the office within 24 hours of your appointment. . Patient verbalized understanding. Conny Situ Jacqueline    

## 2018-09-30 ENCOUNTER — Ambulatory Visit (HOSPITAL_COMMUNITY): Payer: Medicare Other | Attending: Cardiology

## 2018-09-30 DIAGNOSIS — R079 Chest pain, unspecified: Secondary | ICD-10-CM | POA: Diagnosis not present

## 2018-09-30 LAB — MYOCARDIAL PERFUSION IMAGING
CSEPEW: 5.8 METS
Exercise duration (min): 4 min
Exercise duration (sec): 0 s
LV dias vol: 51 mL (ref 46–106)
LV sys vol: 20 mL
MPHR: 142 {beats}/min
NUC STRESS TID: 0.97
Peak HR: 134 {beats}/min
Percent HR: 94 %
Rest HR: 81 {beats}/min
SDS: 4
SRS: 0
SSS: 4

## 2018-09-30 MED ORDER — TECHNETIUM TC 99M TETROFOSMIN IV KIT
31.6000 | PACK | Freq: Once | INTRAVENOUS | Status: AC | PRN
  Administered 2018-09-30: 31.6 via INTRAVENOUS
  Filled 2018-09-30: qty 32

## 2018-09-30 MED ORDER — TECHNETIUM TC 99M TETROFOSMIN IV KIT
10.2000 | PACK | Freq: Once | INTRAVENOUS | Status: AC | PRN
  Administered 2018-09-30: 10.2 via INTRAVENOUS
  Filled 2018-09-30: qty 11

## 2018-10-01 DIAGNOSIS — I10 Essential (primary) hypertension: Secondary | ICD-10-CM | POA: Diagnosis not present

## 2018-10-02 ENCOUNTER — Telehealth: Payer: Self-pay

## 2018-10-02 NOTE — Telephone Encounter (Signed)
Notes recorded by Frederik Schmidt, RN on 10/02/2018 at 8:06 AM EST The patient has been notified of the result and verbalized understanding. All questions (if any) were answered. Frederik Schmidt, RN 10/02/2018 8:06 AM

## 2018-10-02 NOTE — Telephone Encounter (Signed)
-----   Message from Jerline Pain, MD sent at 10/02/2018  6:24 AM EST ----- Excellent results, low risk, no ischemia.  Candee Furbish, MD

## 2018-10-12 DIAGNOSIS — H15101 Unspecified episcleritis, right eye: Secondary | ICD-10-CM | POA: Diagnosis not present

## 2019-02-02 DIAGNOSIS — L218 Other seborrheic dermatitis: Secondary | ICD-10-CM | POA: Diagnosis not present

## 2019-02-02 DIAGNOSIS — L821 Other seborrheic keratosis: Secondary | ICD-10-CM | POA: Diagnosis not present

## 2019-02-02 DIAGNOSIS — D1801 Hemangioma of skin and subcutaneous tissue: Secondary | ICD-10-CM | POA: Diagnosis not present

## 2019-02-02 DIAGNOSIS — D225 Melanocytic nevi of trunk: Secondary | ICD-10-CM | POA: Diagnosis not present

## 2019-03-03 DIAGNOSIS — M810 Age-related osteoporosis without current pathological fracture: Secondary | ICD-10-CM | POA: Diagnosis not present

## 2019-04-10 DIAGNOSIS — Z23 Encounter for immunization: Secondary | ICD-10-CM | POA: Diagnosis not present

## 2019-04-20 DIAGNOSIS — H1851 Endothelial corneal dystrophy: Secondary | ICD-10-CM | POA: Diagnosis not present

## 2019-04-20 DIAGNOSIS — H5213 Myopia, bilateral: Secondary | ICD-10-CM | POA: Diagnosis not present

## 2019-04-20 DIAGNOSIS — H401232 Low-tension glaucoma, bilateral, moderate stage: Secondary | ICD-10-CM | POA: Diagnosis not present

## 2019-04-20 DIAGNOSIS — H2513 Age-related nuclear cataract, bilateral: Secondary | ICD-10-CM | POA: Diagnosis not present

## 2019-05-13 DIAGNOSIS — H2511 Age-related nuclear cataract, right eye: Secondary | ICD-10-CM | POA: Diagnosis not present

## 2019-05-13 DIAGNOSIS — H25811 Combined forms of age-related cataract, right eye: Secondary | ICD-10-CM | POA: Diagnosis not present

## 2019-05-27 DIAGNOSIS — H2512 Age-related nuclear cataract, left eye: Secondary | ICD-10-CM | POA: Diagnosis not present

## 2019-05-27 DIAGNOSIS — H25812 Combined forms of age-related cataract, left eye: Secondary | ICD-10-CM | POA: Diagnosis not present

## 2019-08-10 DIAGNOSIS — Z23 Encounter for immunization: Secondary | ICD-10-CM | POA: Diagnosis not present

## 2019-08-20 DIAGNOSIS — H401232 Low-tension glaucoma, bilateral, moderate stage: Secondary | ICD-10-CM | POA: Diagnosis not present

## 2019-08-26 DIAGNOSIS — I10 Essential (primary) hypertension: Secondary | ICD-10-CM | POA: Diagnosis not present

## 2019-08-26 DIAGNOSIS — R7303 Prediabetes: Secondary | ICD-10-CM | POA: Diagnosis not present

## 2019-08-26 DIAGNOSIS — E559 Vitamin D deficiency, unspecified: Secondary | ICD-10-CM | POA: Diagnosis not present

## 2019-08-26 DIAGNOSIS — Z0000 Encounter for general adult medical examination without abnormal findings: Secondary | ICD-10-CM | POA: Diagnosis not present

## 2019-08-26 DIAGNOSIS — Z7189 Other specified counseling: Secondary | ICD-10-CM | POA: Diagnosis not present

## 2019-08-26 DIAGNOSIS — M859 Disorder of bone density and structure, unspecified: Secondary | ICD-10-CM | POA: Diagnosis not present

## 2019-08-26 DIAGNOSIS — E7800 Pure hypercholesterolemia, unspecified: Secondary | ICD-10-CM | POA: Diagnosis not present

## 2019-08-26 DIAGNOSIS — Z1389 Encounter for screening for other disorder: Secondary | ICD-10-CM | POA: Diagnosis not present

## 2019-09-07 DIAGNOSIS — Z23 Encounter for immunization: Secondary | ICD-10-CM | POA: Diagnosis not present

## 2019-09-09 DIAGNOSIS — R7303 Prediabetes: Secondary | ICD-10-CM | POA: Diagnosis not present

## 2019-09-09 DIAGNOSIS — E559 Vitamin D deficiency, unspecified: Secondary | ICD-10-CM | POA: Diagnosis not present

## 2019-09-09 DIAGNOSIS — M810 Age-related osteoporosis without current pathological fracture: Secondary | ICD-10-CM | POA: Diagnosis not present

## 2019-09-09 DIAGNOSIS — I10 Essential (primary) hypertension: Secondary | ICD-10-CM | POA: Diagnosis not present

## 2019-09-09 DIAGNOSIS — E7800 Pure hypercholesterolemia, unspecified: Secondary | ICD-10-CM | POA: Diagnosis not present

## 2019-09-28 DIAGNOSIS — E7800 Pure hypercholesterolemia, unspecified: Secondary | ICD-10-CM | POA: Diagnosis not present

## 2019-09-28 DIAGNOSIS — M549 Dorsalgia, unspecified: Secondary | ICD-10-CM | POA: Diagnosis not present

## 2019-10-08 DIAGNOSIS — M1612 Unilateral primary osteoarthritis, left hip: Secondary | ICD-10-CM | POA: Diagnosis not present

## 2019-10-08 DIAGNOSIS — M810 Age-related osteoporosis without current pathological fracture: Secondary | ICD-10-CM | POA: Diagnosis not present

## 2019-10-08 DIAGNOSIS — M25552 Pain in left hip: Secondary | ICD-10-CM | POA: Diagnosis not present

## 2019-10-08 DIAGNOSIS — R293 Abnormal posture: Secondary | ICD-10-CM | POA: Diagnosis not present

## 2019-10-08 DIAGNOSIS — M545 Low back pain: Secondary | ICD-10-CM | POA: Diagnosis not present

## 2019-10-08 DIAGNOSIS — M25652 Stiffness of left hip, not elsewhere classified: Secondary | ICD-10-CM | POA: Diagnosis not present

## 2019-10-08 DIAGNOSIS — R2689 Other abnormalities of gait and mobility: Secondary | ICD-10-CM | POA: Diagnosis not present

## 2019-10-12 DIAGNOSIS — R293 Abnormal posture: Secondary | ICD-10-CM | POA: Diagnosis not present

## 2019-10-12 DIAGNOSIS — M25652 Stiffness of left hip, not elsewhere classified: Secondary | ICD-10-CM | POA: Diagnosis not present

## 2019-10-12 DIAGNOSIS — M545 Low back pain: Secondary | ICD-10-CM | POA: Diagnosis not present

## 2019-10-12 DIAGNOSIS — M810 Age-related osteoporosis without current pathological fracture: Secondary | ICD-10-CM | POA: Diagnosis not present

## 2019-10-12 DIAGNOSIS — M25552 Pain in left hip: Secondary | ICD-10-CM | POA: Diagnosis not present

## 2019-10-12 DIAGNOSIS — R2689 Other abnormalities of gait and mobility: Secondary | ICD-10-CM | POA: Diagnosis not present

## 2019-10-19 DIAGNOSIS — M545 Low back pain: Secondary | ICD-10-CM | POA: Diagnosis not present

## 2019-10-19 DIAGNOSIS — M25652 Stiffness of left hip, not elsewhere classified: Secondary | ICD-10-CM | POA: Diagnosis not present

## 2019-10-19 DIAGNOSIS — R2689 Other abnormalities of gait and mobility: Secondary | ICD-10-CM | POA: Diagnosis not present

## 2019-10-19 DIAGNOSIS — M810 Age-related osteoporosis without current pathological fracture: Secondary | ICD-10-CM | POA: Diagnosis not present

## 2019-10-19 DIAGNOSIS — R293 Abnormal posture: Secondary | ICD-10-CM | POA: Diagnosis not present

## 2019-10-19 DIAGNOSIS — M25552 Pain in left hip: Secondary | ICD-10-CM | POA: Diagnosis not present

## 2019-10-26 DIAGNOSIS — M810 Age-related osteoporosis without current pathological fracture: Secondary | ICD-10-CM | POA: Diagnosis not present

## 2019-10-26 DIAGNOSIS — R293 Abnormal posture: Secondary | ICD-10-CM | POA: Diagnosis not present

## 2019-10-26 DIAGNOSIS — M545 Low back pain: Secondary | ICD-10-CM | POA: Diagnosis not present

## 2019-10-26 DIAGNOSIS — M25552 Pain in left hip: Secondary | ICD-10-CM | POA: Diagnosis not present

## 2019-10-26 DIAGNOSIS — R2689 Other abnormalities of gait and mobility: Secondary | ICD-10-CM | POA: Diagnosis not present

## 2019-10-26 DIAGNOSIS — M25652 Stiffness of left hip, not elsewhere classified: Secondary | ICD-10-CM | POA: Diagnosis not present

## 2019-11-02 DIAGNOSIS — M25552 Pain in left hip: Secondary | ICD-10-CM | POA: Diagnosis not present

## 2019-11-02 DIAGNOSIS — R2689 Other abnormalities of gait and mobility: Secondary | ICD-10-CM | POA: Diagnosis not present

## 2019-11-02 DIAGNOSIS — M545 Low back pain: Secondary | ICD-10-CM | POA: Diagnosis not present

## 2019-11-02 DIAGNOSIS — M25652 Stiffness of left hip, not elsewhere classified: Secondary | ICD-10-CM | POA: Diagnosis not present

## 2019-11-02 DIAGNOSIS — R293 Abnormal posture: Secondary | ICD-10-CM | POA: Diagnosis not present

## 2019-11-02 DIAGNOSIS — M810 Age-related osteoporosis without current pathological fracture: Secondary | ICD-10-CM | POA: Diagnosis not present

## 2019-11-02 DIAGNOSIS — M1612 Unilateral primary osteoarthritis, left hip: Secondary | ICD-10-CM | POA: Diagnosis not present

## 2019-11-09 DIAGNOSIS — M25552 Pain in left hip: Secondary | ICD-10-CM | POA: Diagnosis not present

## 2019-11-11 DIAGNOSIS — M1612 Unilateral primary osteoarthritis, left hip: Secondary | ICD-10-CM | POA: Diagnosis not present

## 2019-11-11 DIAGNOSIS — M25552 Pain in left hip: Secondary | ICD-10-CM | POA: Diagnosis not present

## 2019-11-29 DIAGNOSIS — M25552 Pain in left hip: Secondary | ICD-10-CM | POA: Diagnosis not present

## 2019-12-24 DIAGNOSIS — R278 Other lack of coordination: Secondary | ICD-10-CM | POA: Diagnosis not present

## 2019-12-24 DIAGNOSIS — S76012A Strain of muscle, fascia and tendon of left hip, initial encounter: Secondary | ICD-10-CM | POA: Diagnosis not present

## 2019-12-24 DIAGNOSIS — M1612 Unilateral primary osteoarthritis, left hip: Secondary | ICD-10-CM | POA: Diagnosis not present

## 2019-12-24 DIAGNOSIS — R2689 Other abnormalities of gait and mobility: Secondary | ICD-10-CM | POA: Diagnosis not present

## 2020-02-21 DIAGNOSIS — L821 Other seborrheic keratosis: Secondary | ICD-10-CM | POA: Diagnosis not present

## 2020-02-21 DIAGNOSIS — D225 Melanocytic nevi of trunk: Secondary | ICD-10-CM | POA: Diagnosis not present

## 2020-02-21 DIAGNOSIS — L814 Other melanin hyperpigmentation: Secondary | ICD-10-CM | POA: Diagnosis not present

## 2020-02-21 DIAGNOSIS — D1801 Hemangioma of skin and subcutaneous tissue: Secondary | ICD-10-CM | POA: Diagnosis not present

## 2020-02-21 DIAGNOSIS — L718 Other rosacea: Secondary | ICD-10-CM | POA: Diagnosis not present

## 2020-02-21 DIAGNOSIS — L218 Other seborrheic dermatitis: Secondary | ICD-10-CM | POA: Diagnosis not present

## 2020-03-09 DIAGNOSIS — M1990 Unspecified osteoarthritis, unspecified site: Secondary | ICD-10-CM | POA: Diagnosis not present

## 2020-03-09 DIAGNOSIS — R140 Abdominal distension (gaseous): Secondary | ICD-10-CM | POA: Diagnosis not present

## 2020-03-09 DIAGNOSIS — I10 Essential (primary) hypertension: Secondary | ICD-10-CM | POA: Diagnosis not present

## 2020-03-09 DIAGNOSIS — M810 Age-related osteoporosis without current pathological fracture: Secondary | ICD-10-CM | POA: Diagnosis not present

## 2020-05-19 DIAGNOSIS — Z961 Presence of intraocular lens: Secondary | ICD-10-CM | POA: Diagnosis not present

## 2020-05-19 DIAGNOSIS — H52203 Unspecified astigmatism, bilateral: Secondary | ICD-10-CM | POA: Diagnosis not present

## 2020-05-19 DIAGNOSIS — H401232 Low-tension glaucoma, bilateral, moderate stage: Secondary | ICD-10-CM | POA: Diagnosis not present

## 2020-06-03 DIAGNOSIS — Z23 Encounter for immunization: Secondary | ICD-10-CM | POA: Diagnosis not present

## 2020-06-07 DIAGNOSIS — H8110 Benign paroxysmal vertigo, unspecified ear: Secondary | ICD-10-CM | POA: Diagnosis not present

## 2020-06-13 DIAGNOSIS — R2681 Unsteadiness on feet: Secondary | ICD-10-CM | POA: Diagnosis not present

## 2020-06-13 DIAGNOSIS — R2689 Other abnormalities of gait and mobility: Secondary | ICD-10-CM | POA: Diagnosis not present

## 2020-06-13 DIAGNOSIS — H8111 Benign paroxysmal vertigo, right ear: Secondary | ICD-10-CM | POA: Diagnosis not present

## 2020-06-13 DIAGNOSIS — R278 Other lack of coordination: Secondary | ICD-10-CM | POA: Diagnosis not present

## 2020-06-16 DIAGNOSIS — R278 Other lack of coordination: Secondary | ICD-10-CM | POA: Diagnosis not present

## 2020-06-16 DIAGNOSIS — R2681 Unsteadiness on feet: Secondary | ICD-10-CM | POA: Diagnosis not present

## 2020-06-16 DIAGNOSIS — H8111 Benign paroxysmal vertigo, right ear: Secondary | ICD-10-CM | POA: Diagnosis not present

## 2020-06-16 DIAGNOSIS — R2689 Other abnormalities of gait and mobility: Secondary | ICD-10-CM | POA: Diagnosis not present

## 2020-06-20 DIAGNOSIS — H8111 Benign paroxysmal vertigo, right ear: Secondary | ICD-10-CM | POA: Diagnosis not present

## 2020-06-20 DIAGNOSIS — R2689 Other abnormalities of gait and mobility: Secondary | ICD-10-CM | POA: Diagnosis not present

## 2020-06-20 DIAGNOSIS — R2681 Unsteadiness on feet: Secondary | ICD-10-CM | POA: Diagnosis not present

## 2020-06-20 DIAGNOSIS — R278 Other lack of coordination: Secondary | ICD-10-CM | POA: Diagnosis not present

## 2020-06-23 DIAGNOSIS — R278 Other lack of coordination: Secondary | ICD-10-CM | POA: Diagnosis not present

## 2020-06-23 DIAGNOSIS — R2681 Unsteadiness on feet: Secondary | ICD-10-CM | POA: Diagnosis not present

## 2020-06-23 DIAGNOSIS — R2689 Other abnormalities of gait and mobility: Secondary | ICD-10-CM | POA: Diagnosis not present

## 2020-06-23 DIAGNOSIS — H8111 Benign paroxysmal vertigo, right ear: Secondary | ICD-10-CM | POA: Diagnosis not present

## 2020-06-30 DIAGNOSIS — R2689 Other abnormalities of gait and mobility: Secondary | ICD-10-CM | POA: Diagnosis not present

## 2020-06-30 DIAGNOSIS — R2681 Unsteadiness on feet: Secondary | ICD-10-CM | POA: Diagnosis not present

## 2020-06-30 DIAGNOSIS — R278 Other lack of coordination: Secondary | ICD-10-CM | POA: Diagnosis not present

## 2020-06-30 DIAGNOSIS — H8111 Benign paroxysmal vertigo, right ear: Secondary | ICD-10-CM | POA: Diagnosis not present

## 2020-07-04 DIAGNOSIS — R278 Other lack of coordination: Secondary | ICD-10-CM | POA: Diagnosis not present

## 2020-07-04 DIAGNOSIS — R2681 Unsteadiness on feet: Secondary | ICD-10-CM | POA: Diagnosis not present

## 2020-07-04 DIAGNOSIS — H8111 Benign paroxysmal vertigo, right ear: Secondary | ICD-10-CM | POA: Diagnosis not present

## 2020-07-04 DIAGNOSIS — R2689 Other abnormalities of gait and mobility: Secondary | ICD-10-CM | POA: Diagnosis not present

## 2020-07-12 DIAGNOSIS — R278 Other lack of coordination: Secondary | ICD-10-CM | POA: Diagnosis not present

## 2020-07-12 DIAGNOSIS — H8111 Benign paroxysmal vertigo, right ear: Secondary | ICD-10-CM | POA: Diagnosis not present

## 2020-07-12 DIAGNOSIS — R2681 Unsteadiness on feet: Secondary | ICD-10-CM | POA: Diagnosis not present

## 2020-07-12 DIAGNOSIS — R2689 Other abnormalities of gait and mobility: Secondary | ICD-10-CM | POA: Diagnosis not present

## 2020-07-14 DIAGNOSIS — H18513 Endothelial corneal dystrophy, bilateral: Secondary | ICD-10-CM | POA: Diagnosis not present

## 2020-09-25 ENCOUNTER — Other Ambulatory Visit: Payer: Self-pay | Admitting: Family Medicine

## 2020-09-25 DIAGNOSIS — Z0000 Encounter for general adult medical examination without abnormal findings: Secondary | ICD-10-CM | POA: Diagnosis not present

## 2020-09-25 DIAGNOSIS — E7800 Pure hypercholesterolemia, unspecified: Secondary | ICD-10-CM | POA: Diagnosis not present

## 2020-09-25 DIAGNOSIS — E559 Vitamin D deficiency, unspecified: Secondary | ICD-10-CM | POA: Diagnosis not present

## 2020-09-25 DIAGNOSIS — Z1389 Encounter for screening for other disorder: Secondary | ICD-10-CM | POA: Diagnosis not present

## 2020-09-25 DIAGNOSIS — I10 Essential (primary) hypertension: Secondary | ICD-10-CM | POA: Diagnosis not present

## 2020-09-25 DIAGNOSIS — H9193 Unspecified hearing loss, bilateral: Secondary | ICD-10-CM | POA: Diagnosis not present

## 2020-09-25 DIAGNOSIS — M810 Age-related osteoporosis without current pathological fracture: Secondary | ICD-10-CM

## 2020-09-25 DIAGNOSIS — M1990 Unspecified osteoarthritis, unspecified site: Secondary | ICD-10-CM | POA: Diagnosis not present

## 2020-09-25 DIAGNOSIS — Z23 Encounter for immunization: Secondary | ICD-10-CM | POA: Diagnosis not present

## 2020-09-27 DIAGNOSIS — M7061 Trochanteric bursitis, right hip: Secondary | ICD-10-CM | POA: Diagnosis not present

## 2020-09-27 DIAGNOSIS — M25551 Pain in right hip: Secondary | ICD-10-CM | POA: Diagnosis not present

## 2020-09-27 DIAGNOSIS — M25572 Pain in left ankle and joints of left foot: Secondary | ICD-10-CM | POA: Diagnosis not present

## 2020-10-05 DIAGNOSIS — Z9181 History of falling: Secondary | ICD-10-CM | POA: Diagnosis not present

## 2020-10-05 DIAGNOSIS — R278 Other lack of coordination: Secondary | ICD-10-CM | POA: Diagnosis not present

## 2020-10-05 DIAGNOSIS — M67853 Other specified disorders of tendon, right hip: Secondary | ICD-10-CM | POA: Diagnosis not present

## 2020-10-05 DIAGNOSIS — M7061 Trochanteric bursitis, right hip: Secondary | ICD-10-CM | POA: Diagnosis not present

## 2020-10-05 DIAGNOSIS — R2689 Other abnormalities of gait and mobility: Secondary | ICD-10-CM | POA: Diagnosis not present

## 2020-10-05 DIAGNOSIS — M25551 Pain in right hip: Secondary | ICD-10-CM | POA: Diagnosis not present

## 2020-10-06 DIAGNOSIS — Z9181 History of falling: Secondary | ICD-10-CM | POA: Diagnosis not present

## 2020-10-06 DIAGNOSIS — M25551 Pain in right hip: Secondary | ICD-10-CM | POA: Diagnosis not present

## 2020-10-06 DIAGNOSIS — R278 Other lack of coordination: Secondary | ICD-10-CM | POA: Diagnosis not present

## 2020-10-06 DIAGNOSIS — M7061 Trochanteric bursitis, right hip: Secondary | ICD-10-CM | POA: Diagnosis not present

## 2020-10-06 DIAGNOSIS — M67853 Other specified disorders of tendon, right hip: Secondary | ICD-10-CM | POA: Diagnosis not present

## 2020-10-06 DIAGNOSIS — R2689 Other abnormalities of gait and mobility: Secondary | ICD-10-CM | POA: Diagnosis not present

## 2020-10-09 DIAGNOSIS — R278 Other lack of coordination: Secondary | ICD-10-CM | POA: Diagnosis not present

## 2020-10-09 DIAGNOSIS — M7061 Trochanteric bursitis, right hip: Secondary | ICD-10-CM | POA: Diagnosis not present

## 2020-10-09 DIAGNOSIS — Z9181 History of falling: Secondary | ICD-10-CM | POA: Diagnosis not present

## 2020-10-09 DIAGNOSIS — R2689 Other abnormalities of gait and mobility: Secondary | ICD-10-CM | POA: Diagnosis not present

## 2020-10-09 DIAGNOSIS — M25551 Pain in right hip: Secondary | ICD-10-CM | POA: Diagnosis not present

## 2020-10-09 DIAGNOSIS — M67853 Other specified disorders of tendon, right hip: Secondary | ICD-10-CM | POA: Diagnosis not present

## 2020-10-10 DIAGNOSIS — M25551 Pain in right hip: Secondary | ICD-10-CM | POA: Diagnosis not present

## 2020-10-12 DIAGNOSIS — M25551 Pain in right hip: Secondary | ICD-10-CM | POA: Diagnosis not present

## 2020-10-12 DIAGNOSIS — M67853 Other specified disorders of tendon, right hip: Secondary | ICD-10-CM | POA: Diagnosis not present

## 2020-10-12 DIAGNOSIS — M7061 Trochanteric bursitis, right hip: Secondary | ICD-10-CM | POA: Diagnosis not present

## 2020-10-12 DIAGNOSIS — R2689 Other abnormalities of gait and mobility: Secondary | ICD-10-CM | POA: Diagnosis not present

## 2020-10-12 DIAGNOSIS — R278 Other lack of coordination: Secondary | ICD-10-CM | POA: Diagnosis not present

## 2020-10-12 DIAGNOSIS — Z9181 History of falling: Secondary | ICD-10-CM | POA: Diagnosis not present

## 2020-10-16 DIAGNOSIS — M4722 Other spondylosis with radiculopathy, cervical region: Secondary | ICD-10-CM | POA: Diagnosis not present

## 2020-10-16 DIAGNOSIS — M25551 Pain in right hip: Secondary | ICD-10-CM | POA: Diagnosis not present

## 2020-10-16 DIAGNOSIS — M25561 Pain in right knee: Secondary | ICD-10-CM | POA: Diagnosis not present

## 2020-10-17 DIAGNOSIS — R2689 Other abnormalities of gait and mobility: Secondary | ICD-10-CM | POA: Diagnosis not present

## 2020-10-17 DIAGNOSIS — R278 Other lack of coordination: Secondary | ICD-10-CM | POA: Diagnosis not present

## 2020-10-17 DIAGNOSIS — M67853 Other specified disorders of tendon, right hip: Secondary | ICD-10-CM | POA: Diagnosis not present

## 2020-10-17 DIAGNOSIS — M25551 Pain in right hip: Secondary | ICD-10-CM | POA: Diagnosis not present

## 2020-10-17 DIAGNOSIS — M7061 Trochanteric bursitis, right hip: Secondary | ICD-10-CM | POA: Diagnosis not present

## 2020-10-17 DIAGNOSIS — Z9181 History of falling: Secondary | ICD-10-CM | POA: Diagnosis not present

## 2020-10-20 DIAGNOSIS — Z9181 History of falling: Secondary | ICD-10-CM | POA: Diagnosis not present

## 2020-10-20 DIAGNOSIS — R2689 Other abnormalities of gait and mobility: Secondary | ICD-10-CM | POA: Diagnosis not present

## 2020-10-20 DIAGNOSIS — M67853 Other specified disorders of tendon, right hip: Secondary | ICD-10-CM | POA: Diagnosis not present

## 2020-10-20 DIAGNOSIS — M7061 Trochanteric bursitis, right hip: Secondary | ICD-10-CM | POA: Diagnosis not present

## 2020-10-20 DIAGNOSIS — M25551 Pain in right hip: Secondary | ICD-10-CM | POA: Diagnosis not present

## 2020-10-20 DIAGNOSIS — R278 Other lack of coordination: Secondary | ICD-10-CM | POA: Diagnosis not present

## 2020-10-21 DIAGNOSIS — M545 Low back pain, unspecified: Secondary | ICD-10-CM | POA: Diagnosis not present

## 2020-10-24 DIAGNOSIS — R278 Other lack of coordination: Secondary | ICD-10-CM | POA: Diagnosis not present

## 2020-10-24 DIAGNOSIS — Z9181 History of falling: Secondary | ICD-10-CM | POA: Diagnosis not present

## 2020-10-24 DIAGNOSIS — R2689 Other abnormalities of gait and mobility: Secondary | ICD-10-CM | POA: Diagnosis not present

## 2020-10-24 DIAGNOSIS — M25551 Pain in right hip: Secondary | ICD-10-CM | POA: Diagnosis not present

## 2020-10-24 DIAGNOSIS — M67853 Other specified disorders of tendon, right hip: Secondary | ICD-10-CM | POA: Diagnosis not present

## 2020-10-24 DIAGNOSIS — M7061 Trochanteric bursitis, right hip: Secondary | ICD-10-CM | POA: Diagnosis not present

## 2020-10-31 DIAGNOSIS — M25551 Pain in right hip: Secondary | ICD-10-CM | POA: Diagnosis not present

## 2020-10-31 DIAGNOSIS — M7061 Trochanteric bursitis, right hip: Secondary | ICD-10-CM | POA: Diagnosis not present

## 2020-10-31 DIAGNOSIS — Z9181 History of falling: Secondary | ICD-10-CM | POA: Diagnosis not present

## 2020-10-31 DIAGNOSIS — M67853 Other specified disorders of tendon, right hip: Secondary | ICD-10-CM | POA: Diagnosis not present

## 2020-10-31 DIAGNOSIS — R2689 Other abnormalities of gait and mobility: Secondary | ICD-10-CM | POA: Diagnosis not present

## 2020-10-31 DIAGNOSIS — R278 Other lack of coordination: Secondary | ICD-10-CM | POA: Diagnosis not present

## 2020-11-06 DIAGNOSIS — M5416 Radiculopathy, lumbar region: Secondary | ICD-10-CM | POA: Diagnosis not present

## 2020-11-08 DIAGNOSIS — M7061 Trochanteric bursitis, right hip: Secondary | ICD-10-CM | POA: Diagnosis not present

## 2020-11-08 DIAGNOSIS — M25551 Pain in right hip: Secondary | ICD-10-CM | POA: Diagnosis not present

## 2020-11-08 DIAGNOSIS — M67853 Other specified disorders of tendon, right hip: Secondary | ICD-10-CM | POA: Diagnosis not present

## 2020-11-08 DIAGNOSIS — R2689 Other abnormalities of gait and mobility: Secondary | ICD-10-CM | POA: Diagnosis not present

## 2020-11-08 DIAGNOSIS — Z9181 History of falling: Secondary | ICD-10-CM | POA: Diagnosis not present

## 2020-11-08 DIAGNOSIS — R278 Other lack of coordination: Secondary | ICD-10-CM | POA: Diagnosis not present

## 2020-11-13 DIAGNOSIS — H401232 Low-tension glaucoma, bilateral, moderate stage: Secondary | ICD-10-CM | POA: Diagnosis not present

## 2020-11-15 DIAGNOSIS — M25551 Pain in right hip: Secondary | ICD-10-CM | POA: Diagnosis not present

## 2020-11-15 DIAGNOSIS — R278 Other lack of coordination: Secondary | ICD-10-CM | POA: Diagnosis not present

## 2020-11-15 DIAGNOSIS — M67853 Other specified disorders of tendon, right hip: Secondary | ICD-10-CM | POA: Diagnosis not present

## 2020-11-15 DIAGNOSIS — R2689 Other abnormalities of gait and mobility: Secondary | ICD-10-CM | POA: Diagnosis not present

## 2020-11-15 DIAGNOSIS — M7061 Trochanteric bursitis, right hip: Secondary | ICD-10-CM | POA: Diagnosis not present

## 2020-11-15 DIAGNOSIS — Z9181 History of falling: Secondary | ICD-10-CM | POA: Diagnosis not present

## 2020-11-22 DIAGNOSIS — Z9181 History of falling: Secondary | ICD-10-CM | POA: Diagnosis not present

## 2020-11-22 DIAGNOSIS — R278 Other lack of coordination: Secondary | ICD-10-CM | POA: Diagnosis not present

## 2020-11-22 DIAGNOSIS — R2689 Other abnormalities of gait and mobility: Secondary | ICD-10-CM | POA: Diagnosis not present

## 2020-11-22 DIAGNOSIS — M67853 Other specified disorders of tendon, right hip: Secondary | ICD-10-CM | POA: Diagnosis not present

## 2020-11-22 DIAGNOSIS — M25551 Pain in right hip: Secondary | ICD-10-CM | POA: Diagnosis not present

## 2020-11-22 DIAGNOSIS — M7061 Trochanteric bursitis, right hip: Secondary | ICD-10-CM | POA: Diagnosis not present

## 2020-11-28 DIAGNOSIS — M5416 Radiculopathy, lumbar region: Secondary | ICD-10-CM | POA: Diagnosis not present

## 2020-12-04 DIAGNOSIS — R2689 Other abnormalities of gait and mobility: Secondary | ICD-10-CM | POA: Diagnosis not present

## 2020-12-04 DIAGNOSIS — M7061 Trochanteric bursitis, right hip: Secondary | ICD-10-CM | POA: Diagnosis not present

## 2020-12-04 DIAGNOSIS — M67853 Other specified disorders of tendon, right hip: Secondary | ICD-10-CM | POA: Diagnosis not present

## 2020-12-04 DIAGNOSIS — Z9181 History of falling: Secondary | ICD-10-CM | POA: Diagnosis not present

## 2020-12-04 DIAGNOSIS — R278 Other lack of coordination: Secondary | ICD-10-CM | POA: Diagnosis not present

## 2020-12-04 DIAGNOSIS — M25551 Pain in right hip: Secondary | ICD-10-CM | POA: Diagnosis not present

## 2020-12-07 DIAGNOSIS — R278 Other lack of coordination: Secondary | ICD-10-CM | POA: Diagnosis not present

## 2020-12-07 DIAGNOSIS — Z9181 History of falling: Secondary | ICD-10-CM | POA: Diagnosis not present

## 2020-12-07 DIAGNOSIS — M7061 Trochanteric bursitis, right hip: Secondary | ICD-10-CM | POA: Diagnosis not present

## 2020-12-07 DIAGNOSIS — M25551 Pain in right hip: Secondary | ICD-10-CM | POA: Diagnosis not present

## 2020-12-07 DIAGNOSIS — R2689 Other abnormalities of gait and mobility: Secondary | ICD-10-CM | POA: Diagnosis not present

## 2020-12-07 DIAGNOSIS — M67853 Other specified disorders of tendon, right hip: Secondary | ICD-10-CM | POA: Diagnosis not present

## 2020-12-11 DIAGNOSIS — M67853 Other specified disorders of tendon, right hip: Secondary | ICD-10-CM | POA: Diagnosis not present

## 2020-12-11 DIAGNOSIS — R278 Other lack of coordination: Secondary | ICD-10-CM | POA: Diagnosis not present

## 2020-12-11 DIAGNOSIS — M7061 Trochanteric bursitis, right hip: Secondary | ICD-10-CM | POA: Diagnosis not present

## 2020-12-11 DIAGNOSIS — R2689 Other abnormalities of gait and mobility: Secondary | ICD-10-CM | POA: Diagnosis not present

## 2020-12-11 DIAGNOSIS — M25551 Pain in right hip: Secondary | ICD-10-CM | POA: Diagnosis not present

## 2020-12-11 DIAGNOSIS — Z9181 History of falling: Secondary | ICD-10-CM | POA: Diagnosis not present

## 2020-12-21 DIAGNOSIS — I10 Essential (primary) hypertension: Secondary | ICD-10-CM | POA: Diagnosis not present

## 2020-12-26 DIAGNOSIS — M533 Sacrococcygeal disorders, not elsewhere classified: Secondary | ICD-10-CM | POA: Diagnosis not present

## 2020-12-26 DIAGNOSIS — M5416 Radiculopathy, lumbar region: Secondary | ICD-10-CM | POA: Diagnosis not present

## 2020-12-27 DIAGNOSIS — H401232 Low-tension glaucoma, bilateral, moderate stage: Secondary | ICD-10-CM | POA: Diagnosis not present

## 2021-01-08 DIAGNOSIS — R32 Unspecified urinary incontinence: Secondary | ICD-10-CM | POA: Diagnosis not present

## 2021-01-08 DIAGNOSIS — I10 Essential (primary) hypertension: Secondary | ICD-10-CM | POA: Diagnosis not present

## 2021-01-16 ENCOUNTER — Ambulatory Visit: Payer: Medicare Other | Attending: Internal Medicine

## 2021-01-16 ENCOUNTER — Other Ambulatory Visit (HOSPITAL_BASED_OUTPATIENT_CLINIC_OR_DEPARTMENT_OTHER): Payer: Self-pay

## 2021-01-16 DIAGNOSIS — Z23 Encounter for immunization: Secondary | ICD-10-CM

## 2021-01-16 MED ORDER — COVID-19 MRNA VACC (MODERNA) 100 MCG/0.5ML IM SUSP
INTRAMUSCULAR | 0 refills | Status: DC
  Filled 2021-01-16: qty 0.25, 1d supply, fill #0

## 2021-01-16 NOTE — Progress Notes (Signed)
   Covid-19 Vaccination Clinic  Name:  Sandra Becker    MRN: [id number] DOB: May 13, 1941  01/16/2021  [name] was observed post Covid-19 immunization for 15 minutes without incident. She was provided with Vaccine Information Sheet and instruction to access the V-Safe system.   [name] was instructed to call 911 with any severe reactions post vaccine: Difficulty breathing  Swelling of face and throat  A fast heartbeat  A bad rash all over body  Dizziness and weakness   Immunizations Administered     Name Date Dose VIS Date Route   Moderna Covid-19 Booster Vaccine 01/16/2021  9:52 AM 0.25 mL 05/17/2020 Intramuscular   Manufacturer: Moderna   Lot: [id number]   [place]: 52481-859-09

## 2021-01-23 DIAGNOSIS — M7061 Trochanteric bursitis, right hip: Secondary | ICD-10-CM | POA: Diagnosis not present

## 2021-01-23 DIAGNOSIS — M25551 Pain in right hip: Secondary | ICD-10-CM | POA: Diagnosis not present

## 2021-02-14 DIAGNOSIS — M7061 Trochanteric bursitis, right hip: Secondary | ICD-10-CM | POA: Diagnosis not present

## 2021-02-14 DIAGNOSIS — R2689 Other abnormalities of gait and mobility: Secondary | ICD-10-CM | POA: Diagnosis not present

## 2021-02-14 DIAGNOSIS — R278 Other lack of coordination: Secondary | ICD-10-CM | POA: Diagnosis not present

## 2021-02-14 DIAGNOSIS — M4726 Other spondylosis with radiculopathy, lumbar region: Secondary | ICD-10-CM | POA: Diagnosis not present

## 2021-02-14 DIAGNOSIS — M62561 Muscle wasting and atrophy, not elsewhere classified, right lower leg: Secondary | ICD-10-CM | POA: Diagnosis not present

## 2021-02-14 DIAGNOSIS — M62562 Muscle wasting and atrophy, not elsewhere classified, left lower leg: Secondary | ICD-10-CM | POA: Diagnosis not present

## 2021-02-23 DIAGNOSIS — R2689 Other abnormalities of gait and mobility: Secondary | ICD-10-CM | POA: Diagnosis not present

## 2021-02-23 DIAGNOSIS — M7061 Trochanteric bursitis, right hip: Secondary | ICD-10-CM | POA: Diagnosis not present

## 2021-02-23 DIAGNOSIS — M62562 Muscle wasting and atrophy, not elsewhere classified, left lower leg: Secondary | ICD-10-CM | POA: Diagnosis not present

## 2021-02-23 DIAGNOSIS — R278 Other lack of coordination: Secondary | ICD-10-CM | POA: Diagnosis not present

## 2021-02-23 DIAGNOSIS — M62561 Muscle wasting and atrophy, not elsewhere classified, right lower leg: Secondary | ICD-10-CM | POA: Diagnosis not present

## 2021-02-23 DIAGNOSIS — M4726 Other spondylosis with radiculopathy, lumbar region: Secondary | ICD-10-CM | POA: Diagnosis not present

## 2021-02-27 DIAGNOSIS — N3090 Cystitis, unspecified without hematuria: Secondary | ICD-10-CM | POA: Diagnosis not present

## 2021-02-27 DIAGNOSIS — R319 Hematuria, unspecified: Secondary | ICD-10-CM | POA: Diagnosis not present

## 2021-02-27 DIAGNOSIS — M810 Age-related osteoporosis without current pathological fracture: Secondary | ICD-10-CM | POA: Diagnosis not present

## 2021-03-05 ENCOUNTER — Other Ambulatory Visit: Payer: Medicare Other

## 2021-03-06 ENCOUNTER — Other Ambulatory Visit: Payer: Self-pay

## 2021-03-06 ENCOUNTER — Ambulatory Visit
Admission: RE | Admit: 2021-03-06 | Discharge: 2021-03-06 | Disposition: A | Discharge status: Home / Self Care | Payer: Medicare Other | Source: Ambulatory Visit | Attending: Family Medicine | Admitting: Family Medicine

## 2021-03-06 DIAGNOSIS — M85851 Other specified disorders of bone density and structure, right thigh: Secondary | ICD-10-CM | POA: Diagnosis not present

## 2021-03-06 DIAGNOSIS — M62561 Muscle wasting and atrophy, not elsewhere classified, right lower leg: Secondary | ICD-10-CM | POA: Diagnosis not present

## 2021-03-06 DIAGNOSIS — M4726 Other spondylosis with radiculopathy, lumbar region: Secondary | ICD-10-CM | POA: Diagnosis not present

## 2021-03-06 DIAGNOSIS — M62562 Muscle wasting and atrophy, not elsewhere classified, left lower leg: Secondary | ICD-10-CM | POA: Diagnosis not present

## 2021-03-06 DIAGNOSIS — R2689 Other abnormalities of gait and mobility: Secondary | ICD-10-CM | POA: Diagnosis not present

## 2021-03-06 DIAGNOSIS — M85832 Other specified disorders of bone density and structure, left forearm: Secondary | ICD-10-CM | POA: Diagnosis not present

## 2021-03-06 DIAGNOSIS — M7061 Trochanteric bursitis, right hip: Secondary | ICD-10-CM | POA: Diagnosis not present

## 2021-03-06 DIAGNOSIS — R278 Other lack of coordination: Secondary | ICD-10-CM | POA: Diagnosis not present

## 2021-03-06 DIAGNOSIS — M810 Age-related osteoporosis without current pathological fracture: Secondary | ICD-10-CM

## 2021-03-06 DIAGNOSIS — Z780 Asymptomatic menopausal state: Secondary | ICD-10-CM | POA: Diagnosis not present

## 2021-03-07 DIAGNOSIS — L821 Other seborrheic keratosis: Secondary | ICD-10-CM | POA: Diagnosis not present

## 2021-03-07 DIAGNOSIS — D225 Melanocytic nevi of trunk: Secondary | ICD-10-CM | POA: Diagnosis not present

## 2021-04-05 DIAGNOSIS — R2689 Other abnormalities of gait and mobility: Secondary | ICD-10-CM | POA: Diagnosis not present

## 2021-04-05 DIAGNOSIS — M62562 Muscle wasting and atrophy, not elsewhere classified, left lower leg: Secondary | ICD-10-CM | POA: Diagnosis not present

## 2021-04-05 DIAGNOSIS — M7061 Trochanteric bursitis, right hip: Secondary | ICD-10-CM | POA: Diagnosis not present

## 2021-04-05 DIAGNOSIS — M62561 Muscle wasting and atrophy, not elsewhere classified, right lower leg: Secondary | ICD-10-CM | POA: Diagnosis not present

## 2021-04-05 DIAGNOSIS — R278 Other lack of coordination: Secondary | ICD-10-CM | POA: Diagnosis not present

## 2021-04-05 DIAGNOSIS — M4726 Other spondylosis with radiculopathy, lumbar region: Secondary | ICD-10-CM | POA: Diagnosis not present

## 2021-04-11 DIAGNOSIS — E7800 Pure hypercholesterolemia, unspecified: Secondary | ICD-10-CM | POA: Diagnosis not present

## 2021-04-11 DIAGNOSIS — I10 Essential (primary) hypertension: Secondary | ICD-10-CM | POA: Diagnosis not present

## 2021-04-11 DIAGNOSIS — Z23 Encounter for immunization: Secondary | ICD-10-CM | POA: Diagnosis not present

## 2021-04-17 DIAGNOSIS — R319 Hematuria, unspecified: Secondary | ICD-10-CM | POA: Diagnosis not present

## 2021-04-17 DIAGNOSIS — N390 Urinary tract infection, site not specified: Secondary | ICD-10-CM | POA: Diagnosis not present

## 2021-05-02 DIAGNOSIS — R319 Hematuria, unspecified: Secondary | ICD-10-CM | POA: Diagnosis not present

## 2021-05-03 ENCOUNTER — Encounter (HOSPITAL_COMMUNITY): Payer: Self-pay

## 2021-05-03 ENCOUNTER — Emergency Department (HOSPITAL_COMMUNITY): Payer: Medicare Other

## 2021-05-03 ENCOUNTER — Observation Stay (HOSPITAL_COMMUNITY): Payer: Medicare Other

## 2021-05-03 ENCOUNTER — Other Ambulatory Visit: Payer: Self-pay

## 2021-05-03 ENCOUNTER — Observation Stay (HOSPITAL_COMMUNITY)
Admission: EM | Admit: 2021-05-03 | Discharge: 2021-05-04 | Disposition: A | Discharge status: Home / Self Care | Payer: Medicare Other | Attending: Internal Medicine | Admitting: Internal Medicine

## 2021-05-03 DIAGNOSIS — Z87891 Personal history of nicotine dependence: Secondary | ICD-10-CM | POA: Diagnosis not present

## 2021-05-03 DIAGNOSIS — R29810 Facial weakness: Secondary | ICD-10-CM | POA: Diagnosis not present

## 2021-05-03 DIAGNOSIS — Y900 Blood alcohol level of less than 20 mg/100 ml: Secondary | ICD-10-CM | POA: Insufficient documentation

## 2021-05-03 DIAGNOSIS — Z853 Personal history of malignant neoplasm of breast: Secondary | ICD-10-CM | POA: Diagnosis not present

## 2021-05-03 DIAGNOSIS — G459 Transient cerebral ischemic attack, unspecified: Principal | ICD-10-CM | POA: Insufficient documentation

## 2021-05-03 DIAGNOSIS — Z20822 Contact with and (suspected) exposure to covid-19: Secondary | ICD-10-CM | POA: Diagnosis not present

## 2021-05-03 DIAGNOSIS — R29818 Other symptoms and signs involving the nervous system: Secondary | ICD-10-CM | POA: Diagnosis not present

## 2021-05-03 DIAGNOSIS — Z79899 Other long term (current) drug therapy: Secondary | ICD-10-CM | POA: Diagnosis not present

## 2021-05-03 DIAGNOSIS — R404 Transient alteration of awareness: Secondary | ICD-10-CM | POA: Diagnosis not present

## 2021-05-03 DIAGNOSIS — I10 Essential (primary) hypertension: Secondary | ICD-10-CM

## 2021-05-03 DIAGNOSIS — Z8673 Personal history of transient ischemic attack (TIA), and cerebral infarction without residual deficits: Secondary | ICD-10-CM | POA: Diagnosis not present

## 2021-05-03 DIAGNOSIS — E871 Hypo-osmolality and hyponatremia: Secondary | ICD-10-CM | POA: Insufficient documentation

## 2021-05-03 LAB — RAPID URINE DRUG SCREEN, HOSP PERFORMED
Amphetamines: NOT DETECTED
Barbiturates: NOT DETECTED
Benzodiazepines: NOT DETECTED
Cocaine: NOT DETECTED
Opiates: NOT DETECTED
Tetrahydrocannabinol: NOT DETECTED

## 2021-05-03 LAB — PROTIME-INR
INR: 0.9 (ref 0.8–1.2)
Prothrombin Time: 12.3 seconds (ref 11.4–15.2)

## 2021-05-03 LAB — URINALYSIS, ROUTINE W REFLEX MICROSCOPIC
Bilirubin Urine: NEGATIVE
Glucose, UA: NEGATIVE mg/dL
Hgb urine dipstick: NEGATIVE
Ketones, ur: NEGATIVE mg/dL
Leukocytes,Ua: NEGATIVE
Nitrite: NEGATIVE
Protein, ur: NEGATIVE mg/dL
Specific Gravity, Urine: 1.016 (ref 1.005–1.030)
pH: 8 (ref 5.0–8.0)

## 2021-05-03 LAB — DIFFERENTIAL
Abs Immature Granulocytes: 0.03 10*3/uL (ref 0.00–0.07)
Basophils Absolute: 0.1 10*3/uL (ref 0.0–0.1)
Basophils Relative: 1 %
Eosinophils Absolute: 0.3 10*3/uL (ref 0.0–0.5)
Eosinophils Relative: 4 %
Immature Granulocytes: 0 %
Lymphocytes Relative: 35 %
Lymphs Abs: 2.9 10*3/uL (ref 0.7–4.0)
Monocytes Absolute: 0.9 10*3/uL (ref 0.1–1.0)
Monocytes Relative: 11 %
Neutro Abs: 4.1 10*3/uL (ref 1.7–7.7)
Neutrophils Relative %: 49 %

## 2021-05-03 LAB — COMPREHENSIVE METABOLIC PANEL
ALT: 27 U/L (ref 0–44)
AST: 27 U/L (ref 15–41)
Albumin: 4.1 g/dL (ref 3.5–5.0)
Alkaline Phosphatase: 46 U/L (ref 38–126)
Anion gap: 10 (ref 5–15)
BUN: 9 mg/dL (ref 8–23)
CO2: 25 mmol/L (ref 22–32)
Calcium: 9.1 mg/dL (ref 8.9–10.3)
Chloride: 89 mmol/L — ABNORMAL LOW (ref 98–111)
Creatinine, Ser: 0.55 mg/dL (ref 0.44–1.00)
GFR, Estimated: >60 mL/min (ref >60)
Glucose, Bld: 112 mg/dL — ABNORMAL HIGH (ref 70–99)
Potassium: 3.8 mmol/L (ref 3.5–5.1)
Sodium: 124 mmol/L — ABNORMAL LOW (ref 135–145)
Total Bilirubin: 0.7 mg/dL (ref 0.3–1.2)
Total Protein: 6.7 g/dL (ref 6.5–8.1)

## 2021-05-03 LAB — I-STAT CHEM 8, ED
BUN: 10 mg/dL (ref 8–23)
Calcium, Ion: 1.04 mmol/L — ABNORMAL LOW (ref 1.15–1.40)
Chloride: 88 mmol/L — ABNORMAL LOW (ref 98–111)
Creatinine, Ser: 0.5 mg/dL (ref 0.44–1.00)
Glucose, Bld: 112 mg/dL — ABNORMAL HIGH (ref 70–99)
HCT: 40 % (ref 36.0–46.0)
Hemoglobin: 13.6 g/dL (ref 12.0–15.0)
Potassium: 3.8 mmol/L (ref 3.5–5.1)
Sodium: 125 mmol/L — ABNORMAL LOW (ref 135–145)
TCO2: 26 mmol/L (ref 22–32)

## 2021-05-03 LAB — CBG MONITORING, ED: Glucose-Capillary: 103 mg/dL — ABNORMAL HIGH (ref 70–99)

## 2021-05-03 LAB — CBC
HCT: 38.8 % (ref 36.0–46.0)
Hemoglobin: 13.5 g/dL (ref 12.0–15.0)
MCH: 30 pg (ref 26.0–34.0)
MCHC: 34.8 g/dL (ref 30.0–36.0)
MCV: 86.2 fL (ref 80.0–100.0)
Platelets: 335 10*3/uL (ref 150–400)
RBC: 4.5 MIL/uL (ref 3.87–5.11)
RDW: 12.4 % (ref 11.5–15.5)
WBC: 8.3 10*3/uL (ref 4.0–10.5)
nRBC: 0 % (ref 0.0–0.2)

## 2021-05-03 LAB — RESP PANEL BY RT-PCR (FLU A&B, COVID) ARPGX2
Influenza A by PCR: NEGATIVE
Influenza B by PCR: NEGATIVE
SARS Coronavirus 2 by RT PCR: NEGATIVE

## 2021-05-03 LAB — APTT: aPTT: 25 seconds (ref 24–36)

## 2021-05-03 LAB — ETHANOL: Alcohol, Ethyl (B): <10 mg/dL (ref <10)

## 2021-05-03 IMAGING — CT CT HEAD CODE STROKE
3 series · 15 of 47 positions shown, 18 images · non-contrast
Comparison: None.

CLINICAL DATA: Code stroke. Neuro deficit, acute, stroke suspected.

EXAM:
CT HEAD WITHOUT CONTRAST
TECHNIQUE: Contiguous axial images were obtained from the base of the skull
through the vertex without intravenous contrast.

[Series 3: head 5.0 st · axial · 0.43mm/px · z∈[-101,+44]mm · 9 of 35 slices shown, 12 images]
[im 3/35  brain]
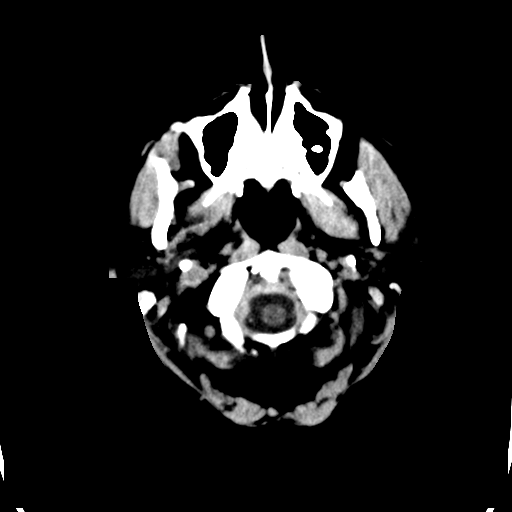
[im 3/35  bone]
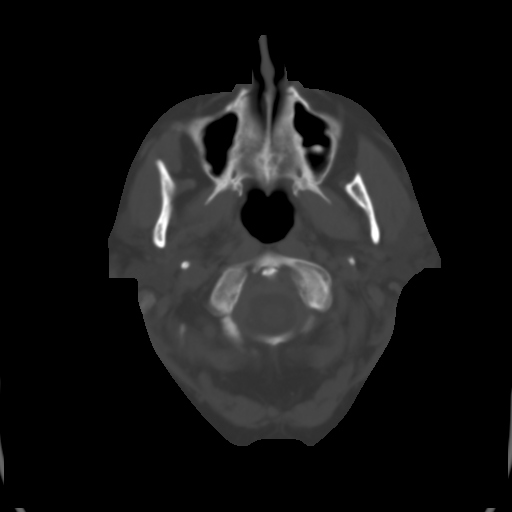
[im 6/35  brain]
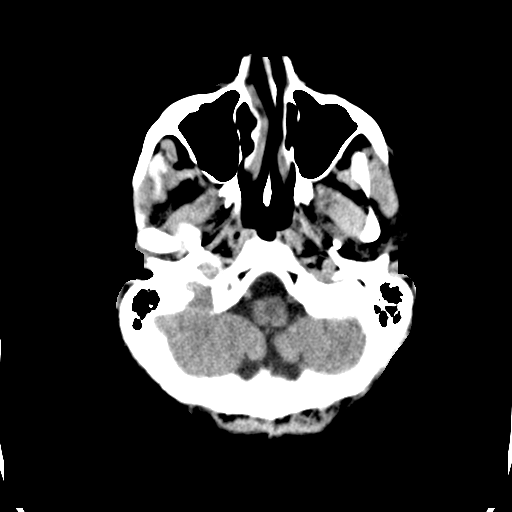
[im 10/35  brain]
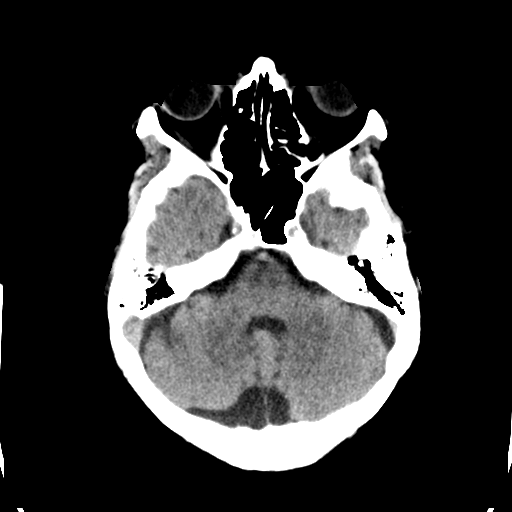
[im 13/35  brain]
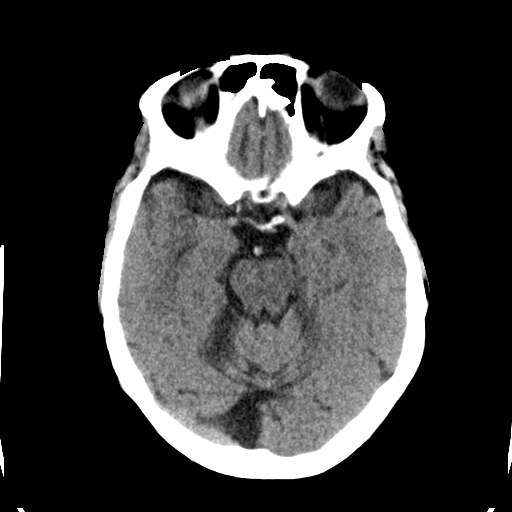
[im 18/35  brain]
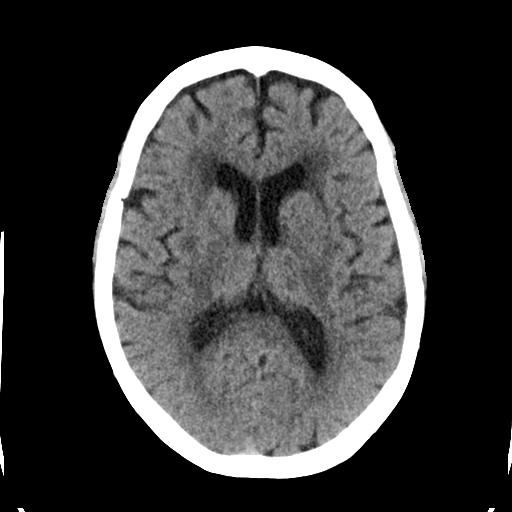
[im 18/35  bone]
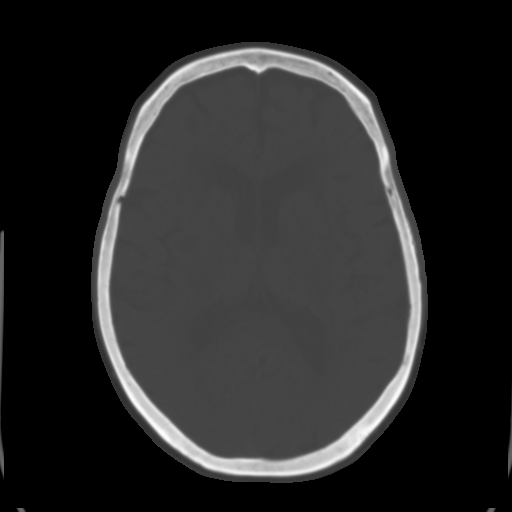
[im 22/35  brain]
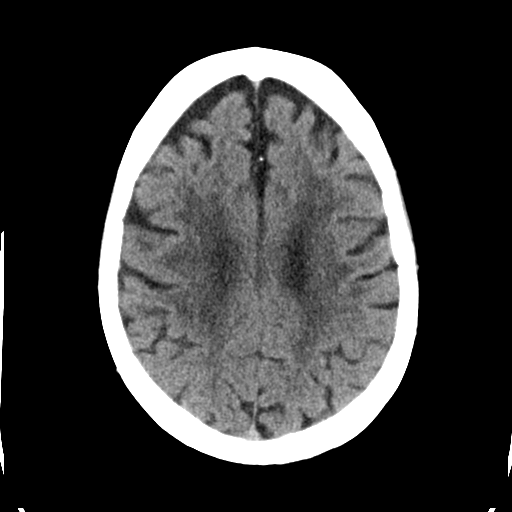
[im 25/35  brain]
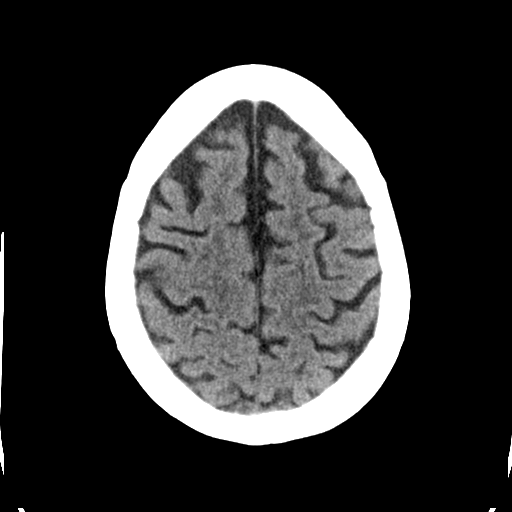
[im 29/35  brain]
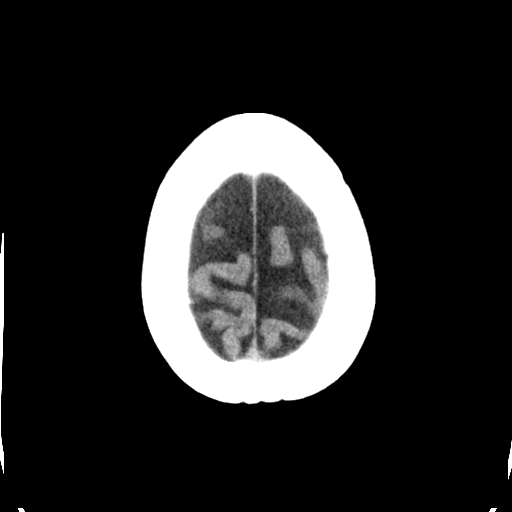
[im 32/35  brain]
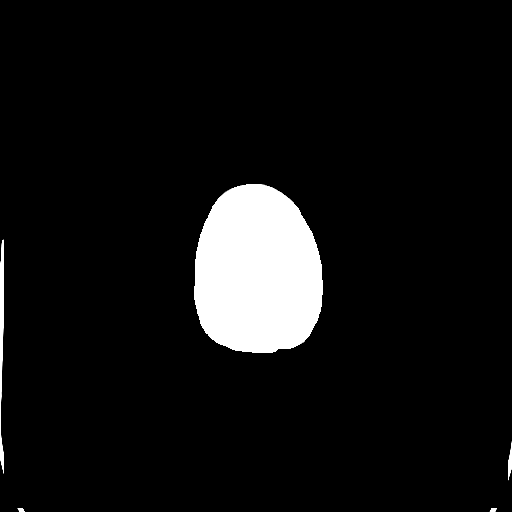
[im 32/35  bone]
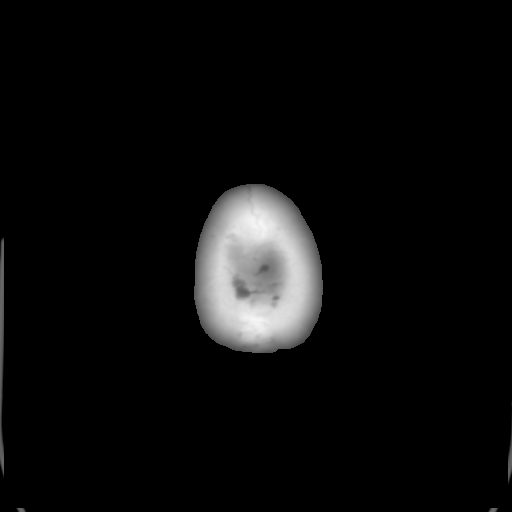

[Series 5: head 3.0 cor st · coronal · 0.34mm/px · 3 of 67 slices shown]
[im 23/67  brain]
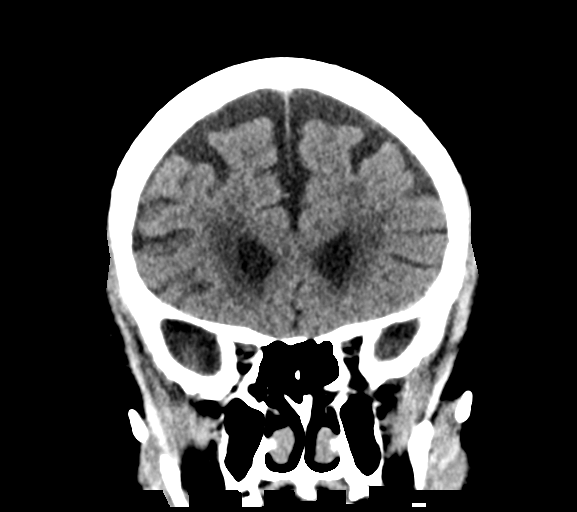
[im 30/67  brain]
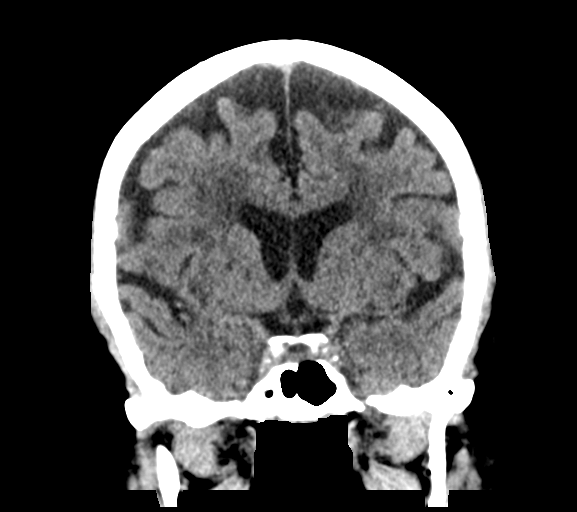
[im 37/67  brain]
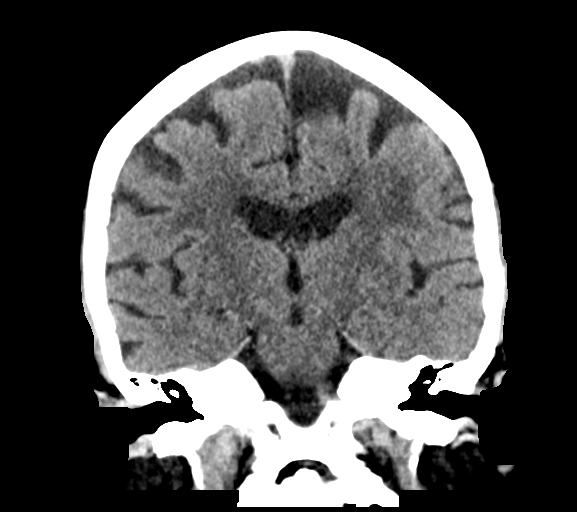

[Series 6: head 3.0 sag st · sagittal · 0.35mm/px · 3 of 55 slices shown]
[im 19/55  brain]
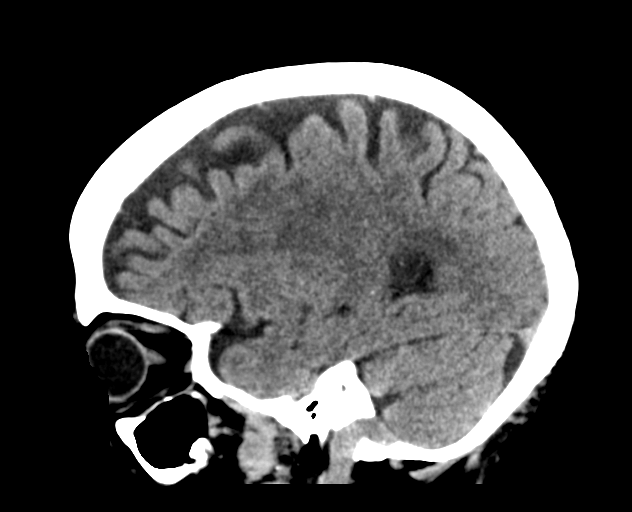
[im 28/55  brain]
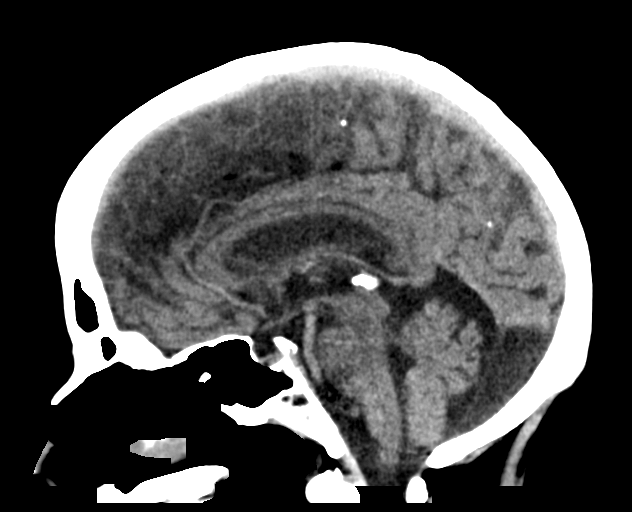
[im 37/55  brain]
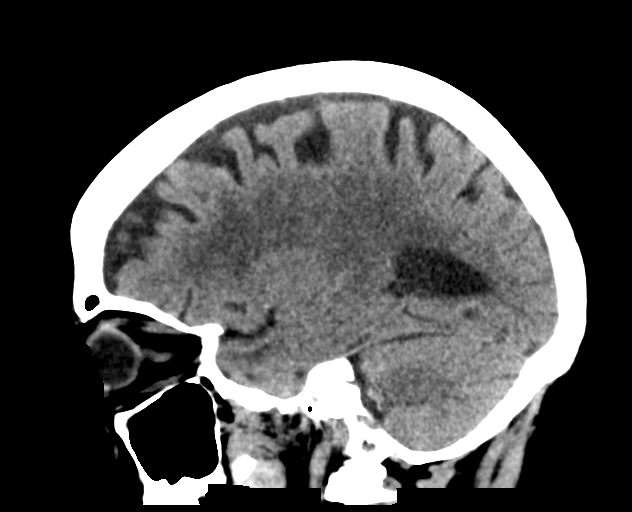

[15 of 47 positions shown; findings below may reference images not displayed]

FINDINGS: Brain: Generalized atrophy. Chronic small-vessel ischemic changes
affecting the pons, thalami, basal ganglia and hemispheric white
matter. No sign of acute cortical insult. No mass lesion,
hemorrhage, hydrocephalus or extra-axial collection.

Vascular: There is atherosclerotic calcification of the major
vessels at the base of the brain.

Skull: Negative

Sinuses/Orbits: Clear/normal

Other: None

ASPECTS (Alberta Stroke Program Early CT Score)

- Ganglionic level infarction (caudate, lentiform nuclei, internal
capsule, insula, M1-M3 cortex): 7

- Supraganglionic infarction (M4-M6 cortex): 3

Total score (0-10 with 10 being normal): 10
IMPRESSION: 1. No acute CT finding. Chronic small-vessel ischemic changes
throughout the brain as outlined above.
2. ASPECTS is 10.

These results were communicated to Dr. KOFI at [DATE] on [DATE]
by text page via the [employer] messaging system.

## 2021-05-03 IMAGING — CT CT ANGIO HEAD-NECK (W OR W/O PERF)
2 of 7 series · 8 of 33 positions shown · IV contrast (OMNI 350)
Comparison: Head CT earlier same day.

CLINICAL DATA: Neuro deficit, acute, stroke suspected.

EXAM:
CT ANGIOGRAPHY HEAD AND NECK
TECHNIQUE: Multidetector CT imaging of the head and neck was performed using
the standard protocol during bolus administration of intravenous
contrast. Multiplanar CT image reconstructions and MIPs were
obtained to evaluate the vascular anatomy. Carotid stenosis
measurements (when applicable) are obtained utilizing NASCET
criteria, using the distal internal carotid diameter as the
denominator.
CONTRAST:  75mL OMNIPAQUE IOHEXOL 350 MG/ML SOLN

[Series 5: cta neck · axial · 0.46mm/px · z∈[-148,-40]mm · 2 of 163 slices shown]
[im 55/163  soft-tissue]
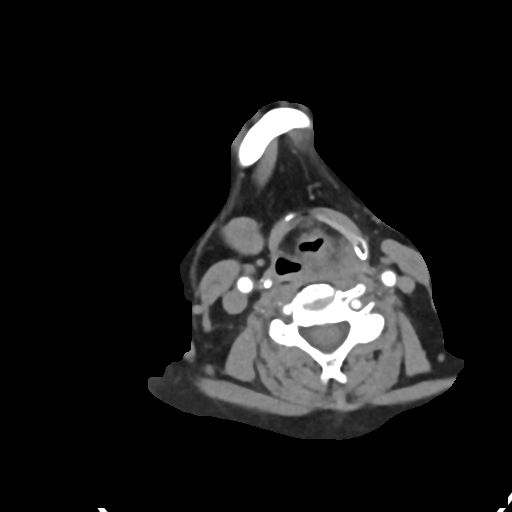
[im 109/163  soft-tissue]
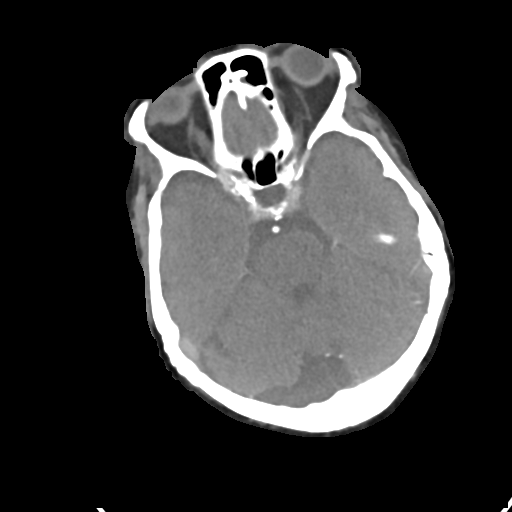

[Series 7: cta neck axial · axial · 0.37mm/px · z∈[-210,+22]mm · 6 of 325 slices shown]
[im 47/325  soft-tissue]
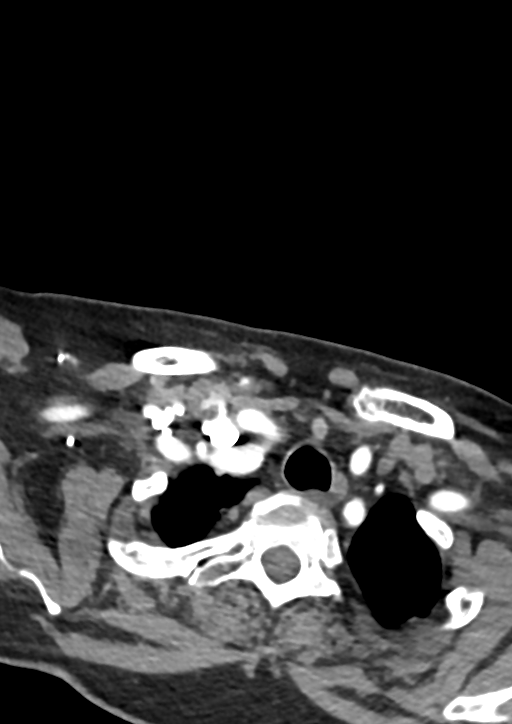
[im 93/325  bone]
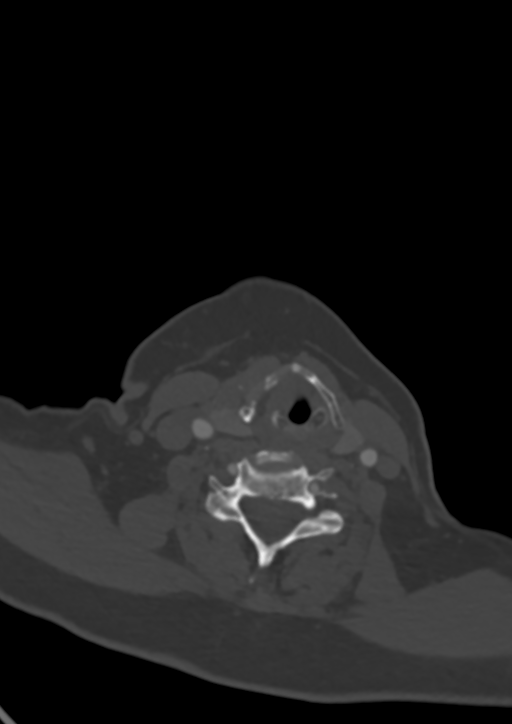
[im 139/325  soft-tissue]
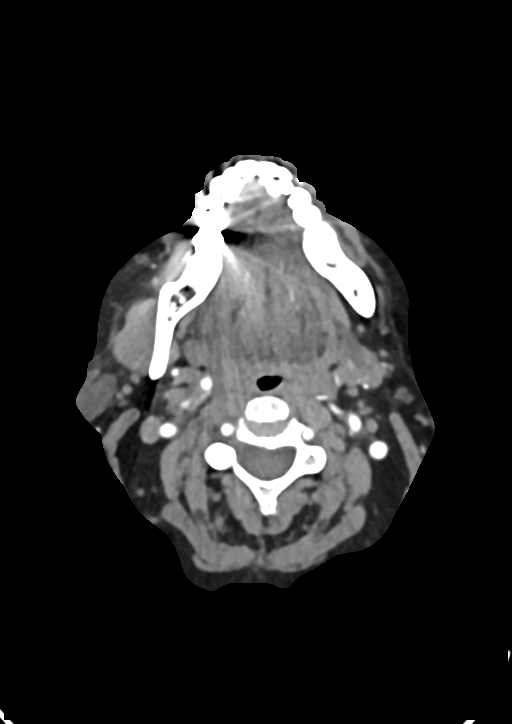
[im 186/325  bone]
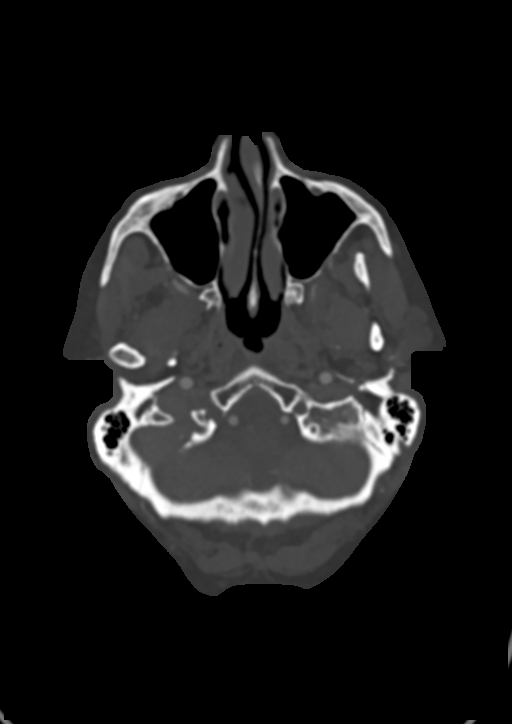
[im 232/325  soft-tissue]
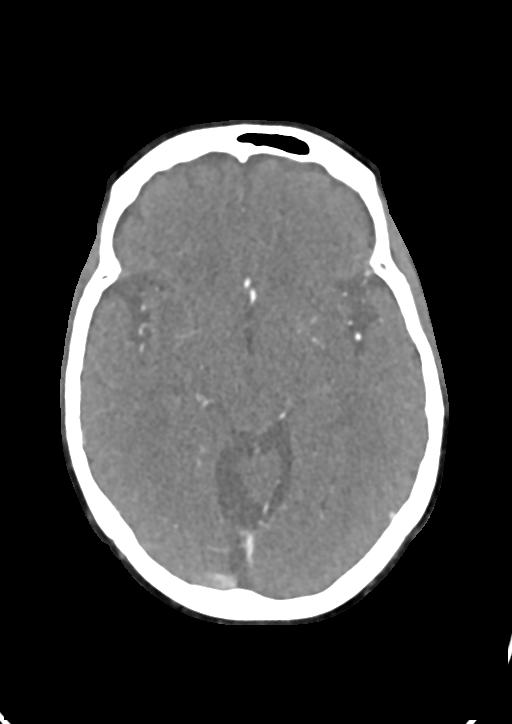
[im 278/325  bone]
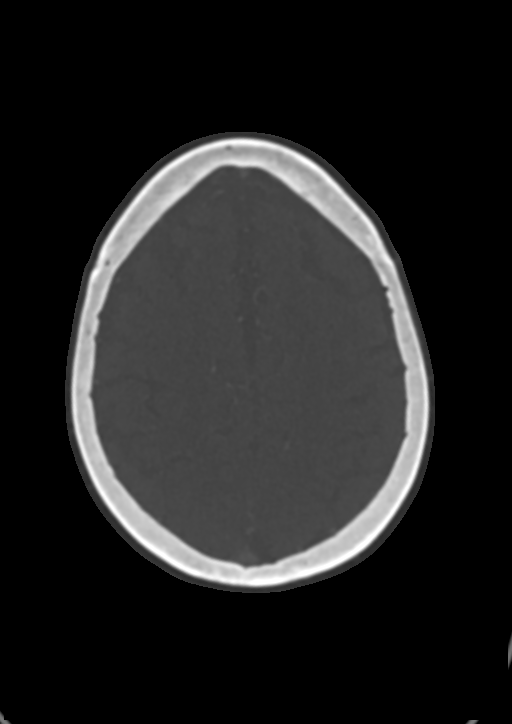

[8 of 33 positions shown; findings below may reference images not displayed]

FINDINGS: CTA NECK FINDINGS

Aortic arch: Aortic atherosclerosis.  Branching pattern is normal.

Right carotid system: Common carotid artery widely patent to the
bifurcation. Carotid bifurcation is normal without soft or calcified
plaque. Cervical ICA is tortuous but widely patent.

Left carotid system: Common carotid artery widely patent to the
bifurcation. Carotid bifurcation is normal. Cervical ICA is tortuous
but widely patent.

Vertebral arteries: Both vertebral artery origins are widely patent
and normal. Both vertebral arteries appear normal through the
cervical region to the foramen magnum.

Skeleton: Minimal mid cervical spondylosis.

Other neck: No mass or lymphadenopathy.

Upper chest: Normal

Review of the MIP images confirms the above findings

CTA HEAD FINDINGS

Anterior circulation: Both internal carotid arteries are patent
through the skull base and siphon regions. There is ordinary siphon
atherosclerotic calcification but no stenosis greater than 30%. The
anterior and middle cerebral vessels are patent. No large vessel
occlusion. No correctable proximal stenosis. No aneurysm or vascular
malformation. There is some distal vessel atherosclerotic
irregularity.

Posterior circulation: Both vertebral arteries are widely patent to
the basilar. No basilar stenosis. Posterior circulation branch
vessels are normal. There is some distal vessel atherosclerotic
irregularity.

Venous sinuses: Patent and normal.

Anatomic variants: None significant.

Review of the MIP images confirms the above findings
IMPRESSION: No intracranial large vessel occlusion or correctable proximal
stenosis. Some distal vessel atherosclerotic irregularity.

Both carotid bifurcations are widely patent.

Aortic Atherosclerosis ([1D]-[1D]).

## 2021-05-03 IMAGING — MR MR HEAD W/O CM
12 of 13 series · 44 of 48 positions shown · non-contrast
Comparison: Head CT [DATE].

CLINICAL DATA: Transient ischemic attack.

EXAM:
MRI HEAD WITHOUT CONTRAST
TECHNIQUE: Multiplanar, multiecho pulse sequences of the brain and surrounding
structures were obtained without intravenous contrast.

[Series 5: DWI · axial · 3.0mm · 0.88mm/px · z∈[-120,+30]mm · 8 of 104 slices shown (1 of 4)]
[im 1/104]
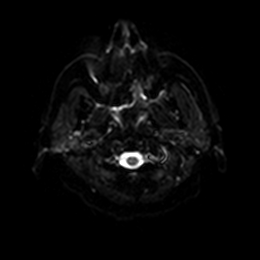
[im 15/104]
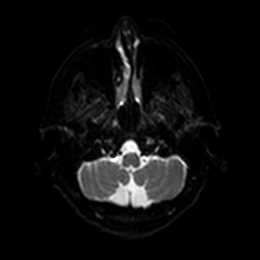
[im 30/104]
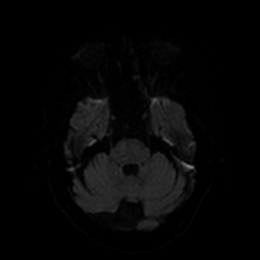
[im 45/104]
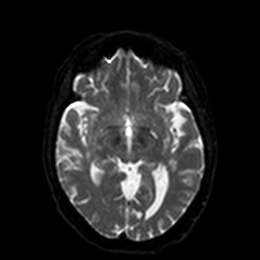
[im 59/104]
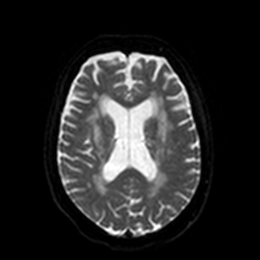
[im 74/104]
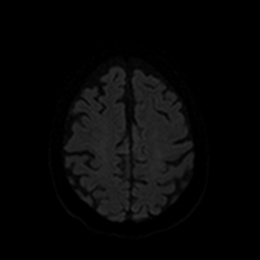
[im 89/104]
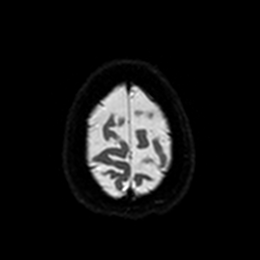
[im 104/104]
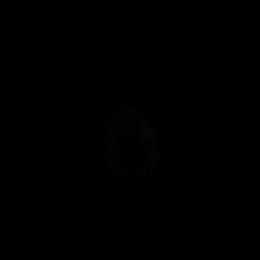

[Series 6: DWI · axial · 3.0mm · 0.88mm/px · z∈[-120,+30]mm · 4 of 52 slices shown (2 of 4)]
[im 1/52]
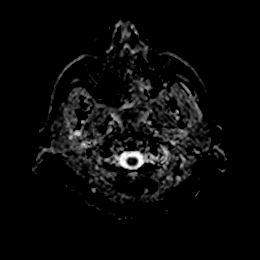
[im 18/52]
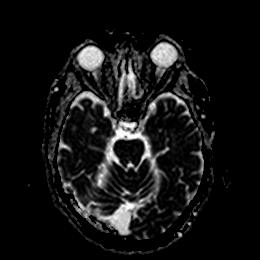
[im 35/52]
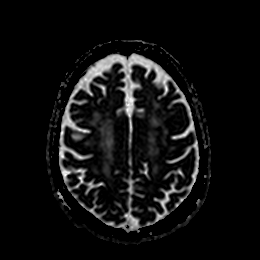
[im 52/52]
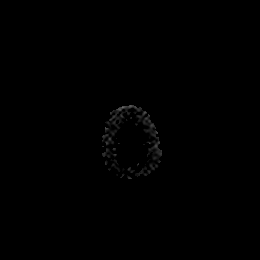

[Series 7: DWI · coronal · 4.0mm · 0.88mm/px · 5 of 70 slices shown (3 of 4)]
[im 1/70]
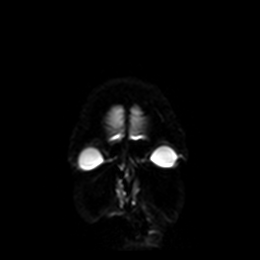
[im 18/70]
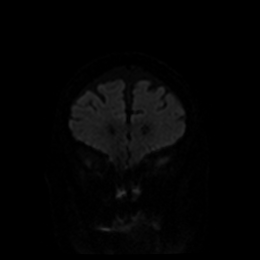
[im 35/70]
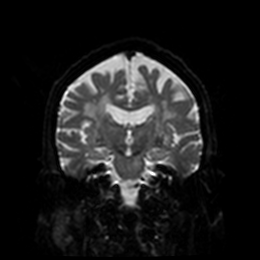
[im 52/70]
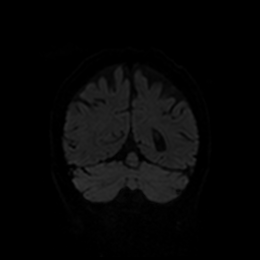
[im 70/70]
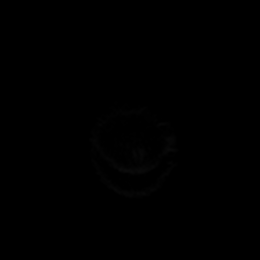

[Series 8: DWI · coronal · 4.0mm · 0.88mm/px · 3 of 35 slices shown (4 of 4)]
[im 1/35]
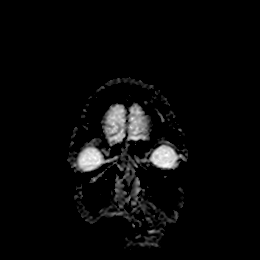
[im 18/35]
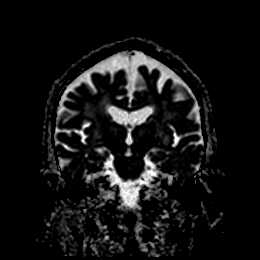
[im 35/35]
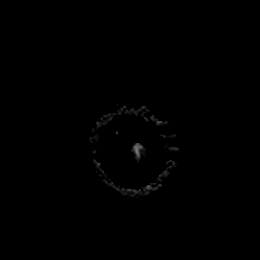

[Series 9: T1 · sagittal · 5.0mm · 0.75mm/px · 2 of 25 slices shown]
[im 1/25]
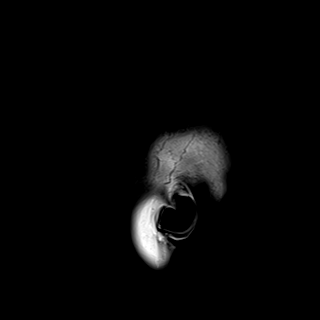
[im 25/25]
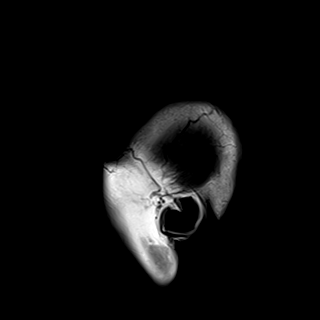

[Series 10: T2 · axial · 5.0mm · 0.72mm/px · z∈[-122,+31]mm · 2 of 27 slices shown (1 of 2)]
[im 1/27]
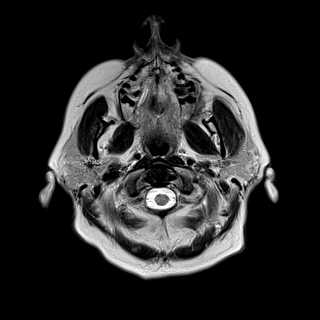
[im 27/27]
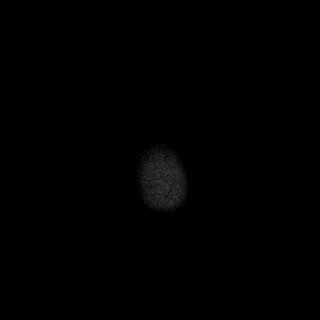

[Series 11: FLAIR · axial · 5.0mm · 0.45mm/px · z∈[-122,+31]mm · 2 of 27 slices shown]
[im 1/27]
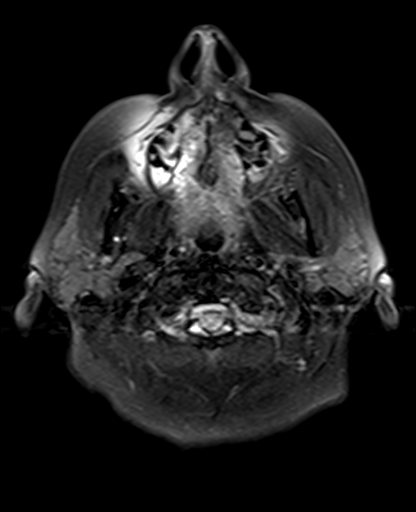
[im 27/27]
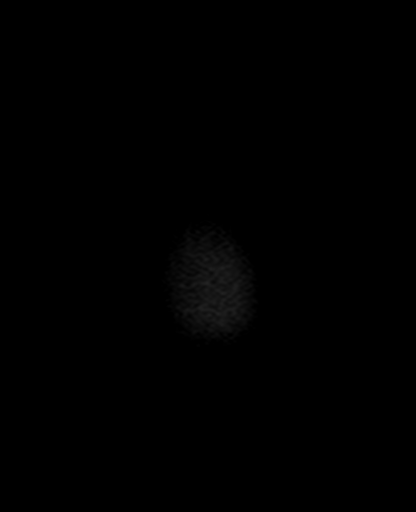

[Series 12: mag_images · axial · 3.0mm · 0.90mm/px · z∈[-126,+36]mm · 4 of 56 slices shown]
[im 1/56]
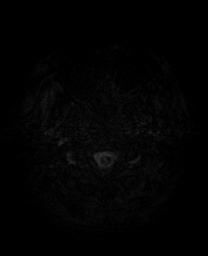
[im 19/56]
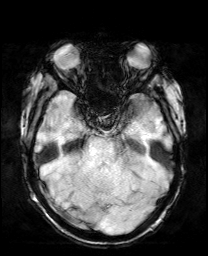
[im 37/56]
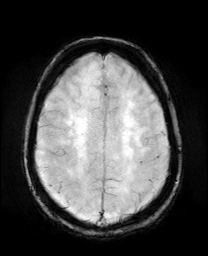
[im 56/56]
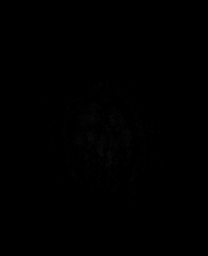

[Series 13: pha_images · axial · 3.0mm · 0.90mm/px · z∈[-126,+33]mm · 4 of 55 slices shown]
[im 1/55]
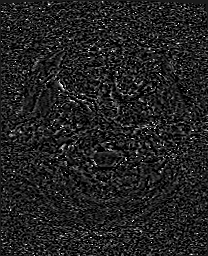
[im 19/55]
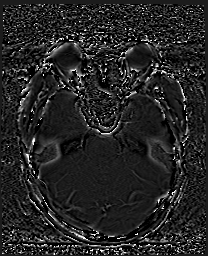
[im 37/55]
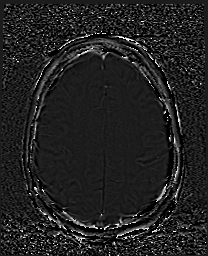
[im 55/55]
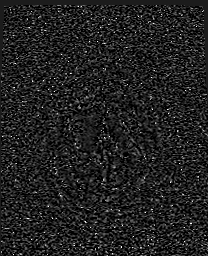

[Series 14: swi_images · axial · 3.0mm · 0.90mm/px · z∈[-126,+36]mm · 4 of 56 slices shown]
[im 1/56]
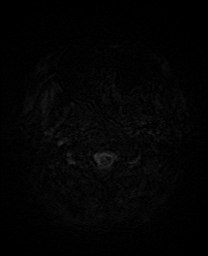
[im 19/56]
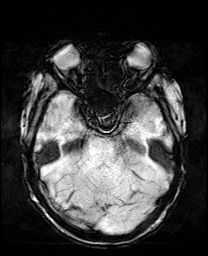
[im 37/56]
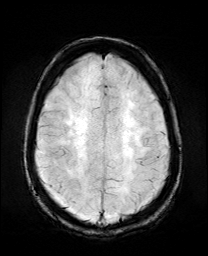
[im 56/56]
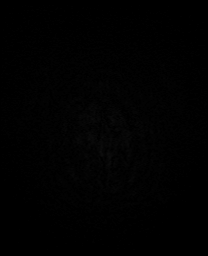

[Series 15: mip_images(sw) · axial · 24.0mm · 0.90mm/px · z∈[-116,+25]mm · 4 of 49 slices shown]
[im 1/49]
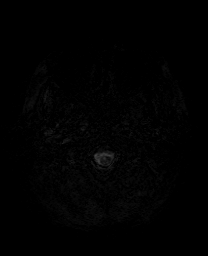
[im 17/49]
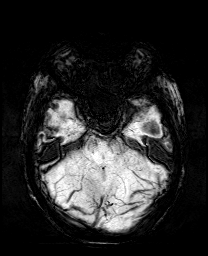
[im 33/49]
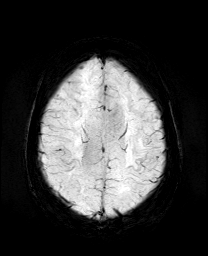
[im 49/49]
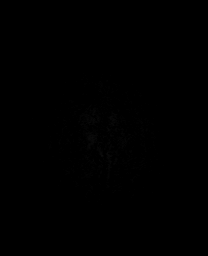

[Series 17: T2 · coronal · 5.0mm · 0.34mm/px · 2 of 29 slices shown (2 of 2)]
[im 1/29]
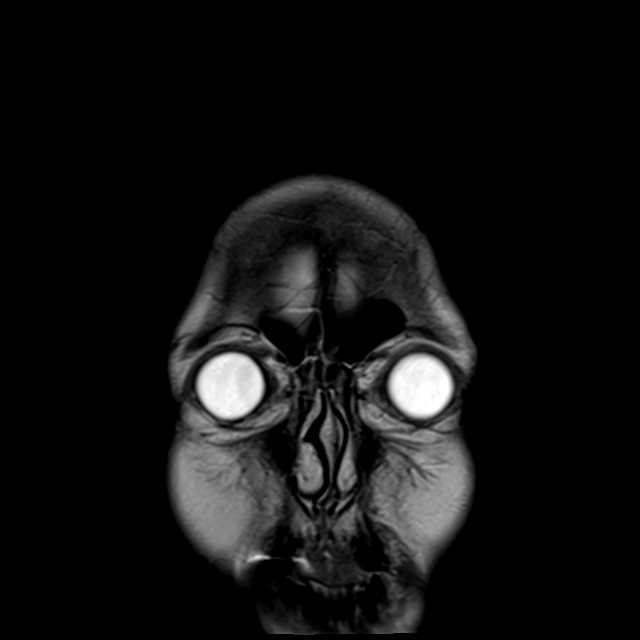
[im 29/29]
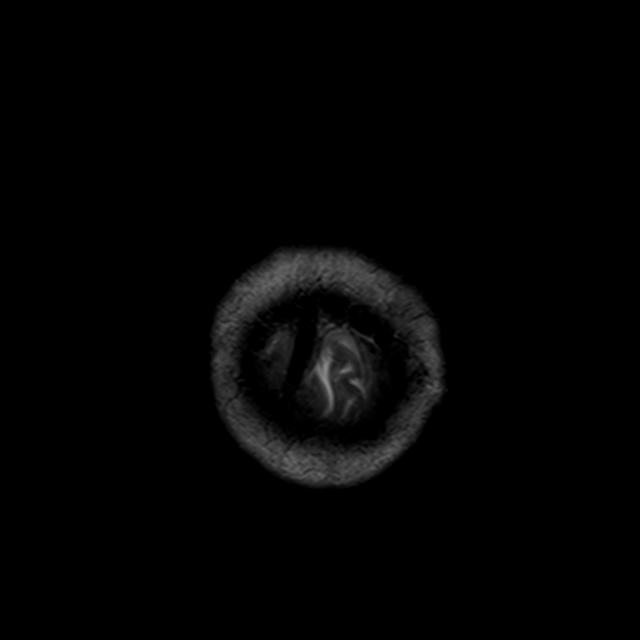

[44 of 48 positions shown; findings below may reference images not displayed]

FINDINGS: Brain: No acute infarction, hemorrhage, hydrocephalus, extra-axial
collection or mass lesion. Remote lacunar infarcts in the right
cerebellar hemisphere. Scattered and confluent foci of T2
hyperintensity are seen within the white matter of the cerebral
hemispheres and within the pons, nonspecific, most likely related to
chronic small vessel ischemia. Moderate parenchymal volume loss.

Vascular: Normal flow voids.

Skull and upper cervical spine: Normal marrow signal.

Sinuses/Orbits: Mild mucosal thickening of the bilateral ethmoid
cells. Bilateral lens surgery.

Other: None.
IMPRESSION: 1. No acute intracranial abnormality.
2. Advanced chronic microvascular ischemic changes of the white
matter.
3. Remote lacunar infarct in the right cerebellar hemisphere.

## 2021-05-03 MED ORDER — ASPIRIN EC 81 MG PO TBEC
81.0000 mg | DELAYED_RELEASE_TABLET | Freq: Every day | ORAL | Status: DC
  Administered 2021-05-04: 81 mg via ORAL
  Filled 2021-05-03: qty 1

## 2021-05-03 MED ORDER — ROSUVASTATIN CALCIUM 5 MG PO TABS
5.0000 mg | ORAL_TABLET | Freq: Every day | ORAL | Status: DC
  Administered 2021-05-03 – 2021-05-04 (×2): 5 mg via ORAL
  Filled 2021-05-03 (×2): qty 1

## 2021-05-03 MED ORDER — HYDRALAZINE HCL 25 MG PO TABS: 25.0000 mg | ORAL_TABLET | Freq: Four times a day (QID) | ORAL | Status: DC | PRN

## 2021-05-03 MED ORDER — LATANOPROST 0.005 % OP SOLN: 1.0000 [drp] | Freq: Every day | OPHTHALMIC | Status: DC

## 2021-05-03 MED ORDER — ENOXAPARIN SODIUM 40 MG/0.4ML IJ SOSY
40.0000 mg | PREFILLED_SYRINGE | INTRAMUSCULAR | Status: DC
  Administered 2021-05-03: 40 mg via SUBCUTANEOUS
  Filled 2021-05-03: qty 0.4

## 2021-05-03 MED ORDER — ACETAMINOPHEN 325 MG PO TABS: 650.0000 mg | ORAL_TABLET | ORAL | Status: DC | PRN

## 2021-05-03 MED ORDER — SENNOSIDES-DOCUSATE SODIUM 8.6-50 MG PO TABS: 1.0000 | ORAL_TABLET | Freq: Every evening | ORAL | Status: DC | PRN

## 2021-05-03 MED ORDER — CLOPIDOGREL BISULFATE 75 MG PO TABS
75.0000 mg | ORAL_TABLET | Freq: Every day | ORAL | Status: DC
  Administered 2021-05-04: 75 mg via ORAL
  Filled 2021-05-03: qty 1

## 2021-05-03 MED ORDER — ACETAMINOPHEN 650 MG RE SUPP: 650.0000 mg | RECTAL | Status: DC | PRN

## 2021-05-03 MED ORDER — ACETAMINOPHEN 160 MG/5ML PO SOLN: 650.0000 mg | ORAL | Status: DC | PRN

## 2021-05-03 MED ORDER — CLOPIDOGREL BISULFATE 300 MG PO TABS
300.0000 mg | ORAL_TABLET | Freq: Once | ORAL | Status: AC
  Administered 2021-05-03: 300 mg via ORAL
  Filled 2021-05-03: qty 1

## 2021-05-03 MED ORDER — SODIUM CHLORIDE 0.9 % IV BOLUS
1000.0000 mL | Freq: Once | INTRAVENOUS | Status: AC
  Administered 2021-05-03: 1000 mL via INTRAVENOUS

## 2021-05-03 MED ORDER — IOHEXOL 350 MG/ML SOLN
75.0000 mL | Freq: Once | INTRAVENOUS | Status: AC | PRN
  Administered 2021-05-03: 75 mL via INTRAVENOUS

## 2021-05-03 MED ORDER — ASPIRIN 325 MG PO TABS
325.0000 mg | ORAL_TABLET | Freq: Every day | ORAL | Status: DC
  Administered 2021-05-03: 325 mg via ORAL
  Filled 2021-05-03: qty 1

## 2021-05-03 MED ORDER — BRINZOLAMIDE 1 % OP SUSP
1.0000 [drp] | Freq: Two times a day (BID) | OPHTHALMIC | Status: DC
  Administered 2021-05-03: 1 [drp] via OPHTHALMIC
  Filled 2021-05-03 (×2): qty 10

## 2021-05-03 MED ORDER — STROKE: EARLY STAGES OF RECOVERY BOOK: Freq: Once | Status: DC

## 2021-05-03 NOTE — Code Documentation (Addendum)
Sandra Becker is an 80 yr old female with history of Breast Cancer with mastectomy who was in usual state of health, having lunch with a friend today. At 1100, she had an acute onset of dysarthria and rt facial droop. EMS was called and Code Stroke was activated at 1227. Pt arrived [facility] at 1230. Pt was cleared at bridge by EDP and taken to CT at 1238. CTH neg for acute hemorrhage per Dr Erlinda Hong. Pt has had a complete resolution of symptoms, NIHSS is 0. Pt will be admitted and have a full stroke workup. Pt will need q 2 hr VS and mNIHSS. Should symptoms re-occur, Code stroke will need to be re-activated. Above discussed with pt's RN Mykenzie. Pt not candidate for TNK as resolution of symptoms. Pt not candidate for NIR as LVO negative.

## 2021-05-03 NOTE — Consult Note (Addendum)
Neurology consult   CC: code stroke.  History is obtained from: EMS.  HPI: [name] is an 80 yo female with a PMHx of breast cancer with bilateral mastectomy, and glaucoma, HTN. No history of stroke or TIA. She denies DM II or HLD. She is on no ASA or AC. Per EMS, she was eating with friends (LKW 1100) when she had difficulty getting her words out and right sided weakness. 911 was called.   When EMS arrived she had a left gaze preference with inability to move her right side. En route, her symptoms got much better, but then again worsened. BP was high in route (200s), but later decreased (160s).  Patient has no HA, n/v, or vision issues.   After brief exam on the ED bridge and for airway clearance, patient was taken emergently to CT suite. CTH showed no acute finding. CTA head and neck without LVO. NIHSS 0. In CT, her prior symptoms had completely resolved.   LKW: 1100 hours TNK given? No, NIHSS 0. Sx resolved.  IR Thrombectomy? No LVO.  MRS:0  NIHSS:  0  ROS: A robust ROS was unable to be performed due to emergent nature of event.   Past Medical History:  Diagnosis Date   Breast cancer [facility]) 1993   bilateral mastectomy; no chemo or radiation   Glaucoma    Hyperlipidemia    Hypertension    Family History  Problem Relation Age of Onset   Colon cancer Neg Hx    Social History:  reports that she quit smoking about 57 years ago. She has never used smokeless tobacco. She reports current alcohol use of about 21.0 standard drinks per week. She reports that she does not use drugs.  Prior to Admission medications   Medication Sig Start Date End Date Taking? Authorizing Provider  brinzolamide (AZOPT) 1 % ophthalmic suspension Place 1 drop into both eyes 2 (two) times daily.     [provider]  COVID-19 mRNA vaccine, Moderna, 100 MCG/0.5ML injection Inject into the muscle. 01/16/21   Carlyle Basques, MD  latanoprost (XALATAN) 0.005 % ophthalmic solution Place 1 drop into  both eyes at bedtime.    [provider]  losartan (COZAAR) 50 MG tablet Take 50 mg by mouth daily.    [provider]   Exam: Current vital signs: BP (!) 204/92   Pulse 99   Temp 98.1 F (36.7 C)   Resp 16   Wt 75.2 kg   SpO2 99%   BMI 27.59 kg/m   Physical Exam  Constitutional: Appears well-developed and well-nourished.  Psych: Affect appropriate to situation. Eyes: No scleral injection. HENT: No OP obstruction. Head: Normocephalic.  Cardiovascular: Normal rate and regular rhythm.  Respiratory: Effort normal.  GI: Abdomen soft.  No distension. There is no tenderness.  Skin: WDI.  Neuro: Mental Status: Patient is awake, alert, oriented to person, place, month, year, and situation. Patient is able to give a clear and coherent history. No signs of neglect. Speech/Language:  Speech is clear, fluent without dysarthria or aphasia. Repetition, naming, and comprehension intact.  Cranial Nerves: II: Visual Fields are full. Pupils are equal, round, and reactive to light.  III,IV, VI: EOMI without ptosis or diploplia.  V: Facial sensation is symmetric to light touch in V1, V2, and V3. VII: Facial movement symmetrical.  VIII: hearing is intact to voice. X: Uvula elevates symmetrically. XI: Shoulder shrug is symmetric. XII: tongue is midline without atrophy or fasciculations.  Motor: RUE: grips  5/5     biceps 5/5     triceps  5/5 LUE: grips   5/5    biceps  5/5     triceps  5/5 RLE: knee  5/5     thigh   5/5     plantar flexion   5/5    dorsiflexion  5/5 LLE: knee  5/5     thigh   5/5    plantar flexion   5/5     dorsiflexion   5/5 Sensory: Sensation is symmetric to light touch in all fours extremities. Extinction absent to DSS.  Plantars: Toes are downgoing bilaterally.  Cerebellar: No ataxia noted with FNF and HKS bilaterally.   I have reviewed labs in epic and the pertinent results are: INR 0.90.   aPTT  25.        creatinine  0.5.    Na 125.   MD  reviewed the images obtained:  NCT head  -No acute CT finding. Chronic small-vessel ischemic changes throughout the brain as outlined above. -ASPECTS is 10.  CTA head and neck: No intracranial large vessel occlusion or correctable proximal stenosis. Some distal vessel atherosclerotic irregularity.  Both carotid bifurcations are widely patent.  Aortic Atherosclerosis (ICD10-I70.0).  Assessment: 80 yo female who came in as a code stroke. Her imaging thus far is negative for an acute concern. Her exam was back to normal when the patient arrived. NIHSS 0. Given no emergent findings on CT suite imaging and the fact that her symptoms resolved, her presentation is most likely consistent with a TIA unless MRI b shows infarct.   Impression:  -Likely TIA.   Plan: - Medicine.  - MRI brain without contrast.  - Recommend TTE. - Recommend labs: HbA1c, lipid panel, TSH. - Recommend high intensity Statin if LDL > 70. - Aspirin 325mg  po qd.  - SBP goal - Permissive hypertension first 24 h < 220/110. Hold home medications for now. - Telemetry monitoring for arrhythmia. - bedside Swallow screen. - Stroke education. - PT/OT/SLP consult. - NIHSS as per protocol. - frequent neuro checks.   Patient seen by Clance Boll, MSN, APN-BC, nurse practitioner and by MD. Note/plan to be edited by MD as needed.  Pager: [phone]  ATTENDING NOTE: I reviewed above note and agree with the assessment and plan. Pt was seen and examined.   80 year old female with history of hypertension, breast cancer status post mastectomy presented to ED for episode of speech difficulty, right-sided weakness, right facial droop.  Per EMS, patient was with a friend at 67 AM for lunch, she developed acute onset speech difficulty, right facial droop and right-sided weakness.  Symptoms fluctuating with EMS, ED, patient symptoms all resolved.  BP 204/92, glucose 112.  When asked patient, she stated that her husband dropped  her off with her friend, then she stopped talking for some time, cannot tell whether she had weakness or facial droop.  She felt she is back to normal in ED. she denies any headache, dizziness, or history of migraine.  On exam, patient awake alert, orientated x3, no focal deficits.  No aphasia, speech clear.  NIH score 0.  CT no acute abnormality.  CTA head and neck unremarkable.  Etiology for patient symptoms concerning for TIA, less likely for complicated migraine or seizure.  Recommend admission for stroke/TIA work-up.  We will do EEG to rule out seizure activity.  Continue further stroke/TIA work up  Frequent neuro checks Telemetry monitoring MRI brain  EEG routine  Echocardiogram  fasting lipid panel and HgbA1C PT/OT/speech consult Permissive hypertension (only treat if BP > 220/120 unless a lower blood pressure is clinically necessary) for 24-48 hours post stroke/TIA onset GI and DVT prophylaxis  Aspirin 325 and Plavix 300 load and then aspirin 81 and Plavix 75 DAPT from tomorrow. Continue home Crestor 5 Stroke risk factor modification Will follow  Rosalin Hawking, MD PhD Stroke Neurology 05/03/2021 4:04 PM

## 2021-05-03 NOTE — ED Notes (Signed)
Dinner tray ordered.

## 2021-05-03 NOTE — H&P (Signed)
History and Physical    Sandra Becker JYN:[id number] DOB: 12/28/1940 DOA: 05/03/2021  PCP: Kelton Pillar, MD (Confirm with patient/family/NH records and if not entered, this has to be entered at [facility] point of entry) Patient coming from: Home  I have personally briefly reviewed patient's old medical records in [place]  Chief Complaint: I feel fine  HPI: Sandra Becker is a 80 y.o. female with medical history significant of recently diagnosed HTN, glaucoma, remote history of breast cancer status post bilateral mastectomy, presented with transient speech problems and facial droop.  Patient was at lunch with a friend, around 11:00, and her friend suddenly noticed patient started to have slurred speech and right facial droop.  EMS arrived and found the patient had right facial droop and slurred speech.  Code stroke called in the ED, CT head negative for acute structural changes or bleeding.  CT angiogram pending.  Patient's symptoms including slurred speech and facial droop all resolved in the ED.  Patient denied any speech problems, no numbness or weakness of any of the limbs no vision problems.  Patient was just recently diagnosed with hypertension about 3 months ago was started on single therapy losartan daily, and about 4 weeks ago, PCP added a second BP med chlorthalidone 25 mg daily.  Patient checks her blood pressure 2 times a week usually SBP 140s.  Blood work, normal CBC, BMP showed sodium 124 compared to 134 2 years ago.   Review of Systems: As per HPI otherwise 14 point review of systems negative.    Past Medical History:  Diagnosis Date   Breast cancer ([facility]) 1993   bilateral mastectomy; no chemo or radiation   Glaucoma    Hyperlipidemia    Hypertension     Past Surgical History:  Procedure Laterality Date   MASTECTOMY Bilateral [place]     reports that she quit smoking about 57 years ago. She  has never used smokeless tobacco. She reports current alcohol use of about 21.0 standard drinks per week. She reports that she does not use drugs.  Allergies  Allergen Reactions   Crab [Shellfish Allergy] Other (See Comments)    From skin test   Fosamax [Alendronate Sodium] Other (See Comments)    Difficult swallowing   Ace Inhibitors Cough    Family History  Problem Relation Age of Onset   Colon cancer Neg Hx      Prior to Admission medications   Medication Sig Start Date End Date Taking? Authorizing Provider  brinzolamide (AZOPT) 1 % ophthalmic suspension Place 1 drop into both eyes 2 (two) times daily.     [provider]  COVID-19 mRNA vaccine, Moderna, 100 MCG/0.5ML injection Inject into the muscle. 01/16/21   Carlyle Basques, MD  latanoprost (XALATAN) 0.005 % ophthalmic solution Place 1 drop into both eyes at bedtime.    [provider]  losartan (COZAAR) 50 MG tablet Take 50 mg by mouth daily.    [provider]    Physical Exam: Vitals:   05/03/21 1255 05/03/21 1300 05/03/21 1305 05/03/21 1315  BP: (!) 167/72 (!) 160/72  (!) 149/59  Pulse: 77 89 82 74  Resp:    11  Temp:      SpO2: 100% 100% 100% 100%  Weight:        Constitutional: NAD, calm, comfortable Vitals:   05/03/21 1255 05/03/21 1300 05/03/21 1305 05/03/21 1315  BP: (!) 167/72 Marland Kitchen)  160/72  (!) 149/59  Pulse: 77 89 82 74  Resp:    11  Temp:      SpO2: 100% 100% 100% 100%  Weight:       Eyes: PERRL, lids and conjunctivae normal ENMT: Mucous membranes are moist. Posterior pharynx clear of any exudate or lesions.Normal dentition.  Neck: normal, supple, no masses, no thyromegaly Respiratory: clear to auscultation bilaterally, no wheezing, no crackles. Normal respiratory effort. No accessory muscle use.  Cardiovascular: Regular rate and rhythm, no murmurs / rubs / gallops. No extremity edema. 2+ pedal pulses. No carotid bruits.  Abdomen: no tenderness, no masses palpated. No  hepatosplenomegaly. Bowel sounds positive.  Musculoskeletal: no clubbing / cyanosis. No joint deformity upper and lower extremities. Good ROM, no contractures. Normal muscle tone.  Skin: no rashes, lesions, ulcers. No induration Neurologic: CN 2-12 grossly intact. Sensation intact, DTR normal. Strength 5/5 in all 4.  Psychiatric: Normal judgment and insight. Alert and oriented x 3. Normal mood.     Labs on Admission: I have personally reviewed following labs and imaging studies  CBC: Recent Labs  Lab 05/03/21 1234 05/03/21 1239  WBC 8.3  --   NEUTROABS 4.1  --   HGB 13.5 13.6  HCT 38.8 40.0  MCV 86.2  --   PLT 335  --    Basic Metabolic Panel: Recent Labs  Lab 05/03/21 1234 05/03/21 1239  NA 124* 125*  K 3.8 3.8  CL 89* 88*  CO2 25  --   GLUCOSE 112* 112*  BUN 9 10  CREATININE 0.55 0.50  CALCIUM 9.1  --    GFR: CrCl cannot be calculated (Unknown ideal weight.). Liver Function Tests: Recent Labs  Lab 05/03/21 1234  AST 27  ALT 27  ALKPHOS 46  BILITOT 0.7  PROT 6.7  ALBUMIN 4.1   No results for input(s): LIPASE, AMYLASE in the last 168 hours. No results for input(s): AMMONIA in the last 168 hours. Coagulation Profile: Recent Labs  Lab 05/03/21 1234  INR 0.9   Cardiac Enzymes: No results for input(s): CKTOTAL, CKMB, CKMBINDEX, TROPONINI in the last 168 hours. BNP (last 3 results) No results for input(s): PROBNP in the last 8760 hours. HbA1C: No results for input(s): HGBA1C in the last 72 hours. CBG: Recent Labs  Lab 05/03/21 1232  GLUCAP 103*   Lipid Profile: No results for input(s): CHOL, HDL, LDLCALC, TRIG, CHOLHDL, LDLDIRECT in the last 72 hours. Thyroid Function Tests: No results for input(s): TSH, T4TOTAL, FREET4, T3FREE, THYROIDAB in the last 72 hours. Anemia Panel: No results for input(s): VITAMINB12, FOLATE, FERRITIN, TIBC, IRON, RETICCTPCT in the last 72 hours. Urine analysis:    Component Value Date/Time   COLORURINE RED (A)  04/27/2011 2131   APPEARANCEUR CLOUDY (A) 04/27/2011 2131   LABSPEC 1.005 04/27/2011 2131   PHURINE 6.0 04/27/2011 2131   GLUCOSEU NEGATIVE 04/27/2011 2131   HGBUR LARGE (A) 04/27/2011 2131   BILIRUBINUR NEGATIVE 04/27/2011 2131   KETONESUR 15 (A) 04/27/2011 2131   PROTEINUR 100 (A) 04/27/2011 2131   UROBILINOGEN 0.2 04/27/2011 2131   NITRITE NEGATIVE 04/27/2011 2131   LEUKOCYTESUR MODERATE (A) 04/27/2011 2131    Radiological Exams on Admission: CT HEAD CODE STROKE WO CONTRAST  Result Date: 05/03/2021 CLINICAL DATA:  Code stroke. Neuro deficit, acute, stroke suspected. EXAM: CT HEAD WITHOUT CONTRAST TECHNIQUE: Contiguous axial images were obtained from the base of the skull through the vertex without intravenous contrast. COMPARISON:  None. FINDINGS: Brain: Generalized atrophy. Chronic small-vessel ischemic changes  affecting the pons, thalami, basal ganglia and hemispheric white matter. No sign of acute cortical insult. No mass lesion, hemorrhage, hydrocephalus or extra-axial collection. Vascular: There is atherosclerotic calcification of the major vessels at the base of the brain. Skull: Negative Sinuses/Orbits: Clear/normal Other: None ASPECTS ([place] Stroke Program Early CT Score) - Ganglionic level infarction (caudate, lentiform nuclei, internal capsule, insula, M1-M3 cortex): 7 - Supraganglionic infarction (M4-M6 cortex): 3 Total score (0-10 with 10 being normal): 10 IMPRESSION: 1. No acute CT finding. Chronic small-vessel ischemic changes throughout the brain as outlined above. 2. ASPECTS is 10. These results were communicated to Dr. Erlinda Hong at 12:51 pm on 05/03/2021 by text page via the [facility] messaging system. Electronically Signed   By: Nelson Chimes M.D.   On: 05/03/2021 12:51   CT ANGIO HEAD NECK W WO CM (CODE STROKE)  Result Date: 05/03/2021 CLINICAL DATA:  Neuro deficit, acute, stroke suspected. EXAM: CT ANGIOGRAPHY HEAD AND NECK TECHNIQUE: Multidetector CT imaging of the head and neck was  performed using the standard protocol during bolus administration of intravenous contrast. Multiplanar CT image reconstructions and MIPs were obtained to evaluate the vascular anatomy. Carotid stenosis measurements (when applicable) are obtained utilizing NASCET criteria, using the distal internal carotid diameter as the denominator. CONTRAST:  19mL OMNIPAQUE IOHEXOL 350 MG/ML SOLN COMPARISON:  Head CT earlier same day. FINDINGS: CTA NECK FINDINGS Aortic arch: Aortic atherosclerosis.  Branching pattern is normal. Right carotid system: Common carotid artery widely patent to the bifurcation. Carotid bifurcation is normal without soft or calcified plaque. Cervical ICA is tortuous but widely patent. Left carotid system: Common carotid artery widely patent to the bifurcation. Carotid bifurcation is normal. Cervical ICA is tortuous but widely patent. Vertebral arteries: Both vertebral artery origins are widely patent and normal. Both vertebral arteries appear normal through the cervical region to the foramen magnum. Skeleton: Minimal mid cervical spondylosis. Other neck: No mass or lymphadenopathy. Upper chest: Normal Review of the MIP images confirms the above findings CTA HEAD FINDINGS Anterior circulation: Both internal carotid arteries are patent through the skull base and siphon regions. There is ordinary siphon atherosclerotic calcification but no stenosis greater than 30%. The anterior and middle cerebral vessels are patent. No large vessel occlusion. No correctable proximal stenosis. No aneurysm or vascular malformation. There is some distal vessel atherosclerotic irregularity. Posterior circulation: Both vertebral arteries are widely patent to the basilar. No basilar stenosis. Posterior circulation branch vessels are normal. There is some distal vessel atherosclerotic irregularity. Venous sinuses: Patent and normal. Anatomic variants: None significant. Review of the MIP images confirms the above findings  IMPRESSION: No intracranial large vessel occlusion or correctable proximal stenosis. Some distal vessel atherosclerotic irregularity. Both carotid bifurcations are widely patent. Aortic Atherosclerosis (ICD10-I70.0). Electronically Signed   By: Nelson Chimes M.D.   On: 05/03/2021 13:18    EKG: Independently reviewed.  Sinus rhythm, no acute ST changes.  Assessment/Plan Active Problems:   TIA (transient ischemic attack)  (please populate well all problems here in Problem List. (For example, if patient is on BP meds at home and you resume or decide to hold them, it is a problem that needs to be her. Same for CAD, COPD, HLD and so on)  TIA -With transient symptoms of aphasia, right facial droop, which all resolved in ED. -MRI -Telemetry monitoring x24 hours:, Echocardiogram. -Allow permissive hypertension 200/110 with as needed hydralazine. -A1c and lipid panel.  Hyponatremia -Euvolemic. -Likely secondary to his chlorthalidone.  Discontinue chlorthalidone and repeat Na level in AM.  As needed hydralazine for BP control in 48 hours. -Adjust BP meds on discharge.  HTN -As above.  Glaucoma -Continue Timolol eye drops. -Stable  DVT prophylaxis: Lovenox Code Status: Full Code Family Communication: Husband at bedside Disposition Plan: Expect less than 2 midnight hospital stay Consults called: Neuro Admission status: Tele obs   Lequita Halt MD [facility] Pager [phone]  05/03/2021, 1:38 PM

## 2021-05-03 NOTE — ED Triage Notes (Signed)
Pt bib [facility] after friends witnessed slurred speech at lunch. LKW 1100. EMS witnessed R side weakness , R facial droop and slurred speech. Facial droop improved per EMS but then worsened again. BP 205/120, CBG 103. 18G LAC placed PTA.

## 2021-05-03 NOTE — Progress Notes (Signed)
Unable to get EEG at this time. Pt going to STAT MRI. Will attempt EEG tomorrow when schedule permits.

## 2021-05-03 NOTE — ED Provider Notes (Signed)
[place] EMERGENCY DEPARTMENT Provider Note   CSN: [id number] Arrival date & time: 05/03/21  1230     History No chief complaint on file.   Sandra Becker is a 80 y.o. female.  Patient with acute onset neurologic symptoms around 11 AM today. Was meeting friends for lunch, when suddenly was noted to not act like normal self. Her pattern/nature of speech was off, became less verbally responsive, ?confused, and friends noted right facial droop. Symptoms acute onset, moderate, persistent, waxing and waning but currently improved. EMS was called. EMS noted pt symptoms began to improved, and then suddenly she quit trying to verbalize again.  On arrival to ED, pt is alert, speaking normally, without obvious  facial weakness. Pt denies headache. No cp or sob. No nv. No fever or chills. EMS activated a code stroke pta.   The history is provided by the patient, medical records and the EMS personnel.      Past Medical History:  Diagnosis Date   Breast cancer [facility]) 1993   bilateral mastectomy; no chemo or radiation   Glaucoma    Hyperlipidemia    Hypertension     There are no problems to display for this patient.   Past Surgical History:  Procedure Laterality Date   MASTECTOMY Bilateral 1993   TONSILLECTOMY AND ADENOIDECTOMY  1947   TUBAL LIGATION  1974     OB History   No obstetric history on file.     Family History  Problem Relation Age of Onset   Colon cancer Neg Hx     Social History   Tobacco Use   Smoking status: Former    Types: Cigarettes    Quit date: 07/30/1963    Years since quitting: 57.8   Smokeless tobacco: Never  Substance Use Topics   Alcohol use: Yes    Alcohol/week: 21.0 standard drinks    Types: 21 Glasses of wine per week   Drug use: No    Home Medications Prior to Admission medications   Medication Sig Start Date End Date Taking? Authorizing Provider  brinzolamide (AZOPT) 1 % ophthalmic suspension Place 1 drop into  both eyes 2 (two) times daily.     [provider]  COVID-19 mRNA vaccine, Moderna, 100 MCG/0.5ML injection Inject into the muscle. 01/16/21   Carlyle Basques, MD  latanoprost (XALATAN) 0.005 % ophthalmic solution Place 1 drop into both eyes at bedtime.    [provider]  losartan (COZAAR) 50 MG tablet Take 50 mg by mouth daily.    [provider]    Allergies    Otho Darner allergy], Fosamax [alendronate sodium], and Ace inhibitors  Review of Systems   Review of Systems  Constitutional:  Negative for fever.  HENT:  Negative for trouble swallowing.   Eyes:  Negative for redness.  Respiratory:  Negative for shortness of breath.   Cardiovascular:  Negative for chest pain.  Gastrointestinal:  Negative for abdominal pain and vomiting.  Genitourinary:  Negative for flank pain.  Musculoskeletal:  Negative for back pain and neck pain.  Skin:  Negative for rash.  Neurological:  Positive for speech difficulty. Negative for headaches.  Hematological:  Does not bruise/bleed easily.  Psychiatric/Behavioral:  Negative for agitation.    Physical Exam Updated Vital Signs There were no vitals taken for this visit.  Physical Exam Vitals and nursing note reviewed.  Constitutional:      Appearance: Normal appearance. She is well-developed.  HENT:     Head: Atraumatic.  Nose: Nose normal.     Mouth/Throat:     Mouth: Mucous membranes are moist.  Eyes:     General: No scleral icterus.    Conjunctiva/sclera: Conjunctivae normal.     Pupils: Pupils are equal, round, and reactive to light.  Neck:     Vascular: No carotid bruit.     Trachea: No tracheal deviation.  Cardiovascular:     Rate and Rhythm: Normal rate and regular rhythm.     Pulses: Normal pulses.     Heart sounds: Normal heart sounds. No murmur heard.   No friction rub. No gallop.  Pulmonary:     Effort: Pulmonary effort is normal. No respiratory distress.     Breath sounds: Normal breath  sounds.  Abdominal:     General: Bowel sounds are normal. There is no distension.     Palpations: Abdomen is soft.     Tenderness: There is no abdominal tenderness.  Genitourinary:    Comments: No cva tenderness.  Musculoskeletal:        General: No swelling.     Cervical back: Normal range of motion and neck supple. No rigidity. No muscular tenderness.  Skin:    General: Skin is warm and dry.     Findings: No rash.  Neurological:     Mental Status: She is alert.     Comments: Alert, speech normal. No gross dysarthria or aphasia. No pronator drift. Motor/sens grossly intact bil.   Psychiatric:        Mood and Affect: Mood normal.    ED Results / Procedures / Treatments   Labs (all labs ordered are listed, but only abnormal results are displayed) Results for orders placed or performed during the hospital encounter of 05/03/21  Ethanol  Result Value Ref Range   Alcohol, Ethyl (B) <10 <10 mg/dL  Protime-INR  Result Value Ref Range   Prothrombin Time 12.3 11.4 - 15.2 seconds   INR 0.9 0.8 - 1.2  APTT  Result Value Ref Range   aPTT 25 24 - 36 seconds  CBC  Result Value Ref Range   WBC 8.3 4.0 - 10.5 K/uL   RBC 4.50 3.87 - 5.11 MIL/uL   Hemoglobin 13.5 12.0 - 15.0 g/dL   HCT 38.8 36.0 - 46.0 %   MCV 86.2 80.0 - 100.0 fL   MCH 30.0 26.0 - 34.0 pg   MCHC 34.8 30.0 - 36.0 g/dL   RDW 12.4 11.5 - 15.5 %   Platelets 335 150 - 400 K/uL   nRBC 0.0 0.0 - 0.2 %  Differential  Result Value Ref Range   Neutrophils Relative % 49 %   Neutro Abs 4.1 1.7 - 7.7 K/uL   Lymphocytes Relative 35 %   Lymphs Abs 2.9 0.7 - 4.0 K/uL   Monocytes Relative 11 %   Monocytes Absolute 0.9 0.1 - 1.0 K/uL   Eosinophils Relative 4 %   Eosinophils Absolute 0.3 0.0 - 0.5 K/uL   Basophils Relative 1 %   Basophils Absolute 0.1 0.0 - 0.1 K/uL   Immature Granulocytes 0 %   Abs Immature Granulocytes 0.03 0.00 - 0.07 K/uL  CBG monitoring, ED  Result Value Ref Range   Glucose-Capillary 103 (H) 70 - 99  mg/dL   Comment 1 Notify RN    Comment 2 Document in Chart   I-stat chem 8, ED  Result Value Ref Range   Sodium 125 (L) 135 - 145 mmol/L   Potassium 3.8 3.5 - 5.1 mmol/L  Chloride 88 (L) 98 - 111 mmol/L   BUN 10 8 - 23 mg/dL   Creatinine, Ser 0.50 0.44 - 1.00 mg/dL   Glucose, Bld 112 (H) 70 - 99 mg/dL   Calcium, Ion 1.04 (L) 1.15 - 1.40 mmol/L   TCO2 26 22 - 32 mmol/L   Hemoglobin 13.6 12.0 - 15.0 g/dL   HCT 40.0 36.0 - 46.0 %      EKG EKG Interpretation  Date/Time:  Thursday May 03 2021 13:09:55 EDT Ventricular Rate:  79 PR Interval:  156 QRS Duration: 100 QT Interval:  383 QTC Calculation: 439 R Axis:   56 Text Interpretation: Sinus rhythm Confirmed by Lajean Saver [id number]) on 05/03/2021 1:13:05 PM  Radiology CT HEAD CODE STROKE WO CONTRAST  Result Date: 05/03/2021 CLINICAL DATA:  Code stroke. Neuro deficit, acute, stroke suspected. EXAM: CT HEAD WITHOUT CONTRAST TECHNIQUE: Contiguous axial images were obtained from the base of the skull through the vertex without intravenous contrast. COMPARISON:  None. FINDINGS: Brain: Generalized atrophy. Chronic small-vessel ischemic changes affecting the pons, thalami, basal ganglia and hemispheric white matter. No sign of acute cortical insult. No mass lesion, hemorrhage, hydrocephalus or extra-axial collection. Vascular: There is atherosclerotic calcification of the major vessels at the base of the brain. Skull: Negative Sinuses/Orbits: Clear/normal Other: None ASPECTS ([place] Stroke Program Early CT Score) - Ganglionic level infarction (caudate, lentiform nuclei, internal capsule, insula, M1-M3 cortex): 7 - Supraganglionic infarction (M4-M6 cortex): 3 Total score (0-10 with 10 being normal): 10 IMPRESSION: 1. No acute CT finding. Chronic small-vessel ischemic changes throughout the brain as outlined above. 2. ASPECTS is 10. These results were communicated to Dr. Erlinda Hong at 12:51 pm on 05/03/2021 by text page via the [facility] messaging system.  Electronically Signed   By: Nelson Chimes M.D.   On: 05/03/2021 12:51    Procedures Procedures   Medications Ordered in ED Medications - No data to display  ED Course  I have reviewed the triage vital signs and the nursing notes.  Pertinent labs & imaging results that were available during my care of the patient were reviewed by me and considered in my medical decision making (see chart for details).    MDM Rules/Calculators/A&P                           Iv ns. Continuous pulse ox and cardiac monitoring. Stat labs. Imaging. Ecg. Code stroke activation pta. Neurology emergently consulted  - evaluated at bedside. Stat ct.   Reviewed nursing notes and prior charts for additional history.   Labs reviewed/interpreted by me - glucose 103, normal. NA is low 125. Ns bolus.   CT reviewed/interpreted by me - no hem.   Neurology indicates admit to medicine for TIA workup.  Medicine/hospitalists consulted for admission.  Recheck, no change, no focal deficit on exam.      Final Clinical Impression(s) / ED Diagnoses Final diagnoses:  None    Rx / DC Orders ED Discharge Orders     None        Lajean Saver, MD 05/03/21 1314

## 2021-05-03 NOTE — ED Notes (Signed)
Received pt ao x 4, NAD. Transport now here to take pt to MRI.

## 2021-05-04 ENCOUNTER — Observation Stay (HOSPITAL_COMMUNITY): Payer: Medicare Other

## 2021-05-04 ENCOUNTER — Other Ambulatory Visit (HOSPITAL_COMMUNITY): Payer: Self-pay

## 2021-05-04 ENCOUNTER — Observation Stay (HOSPITAL_BASED_OUTPATIENT_CLINIC_OR_DEPARTMENT_OTHER): Payer: Medicare Other

## 2021-05-04 DIAGNOSIS — G459 Transient cerebral ischemic attack, unspecified: Secondary | ICD-10-CM | POA: Diagnosis not present

## 2021-05-04 DIAGNOSIS — R29810 Facial weakness: Secondary | ICD-10-CM

## 2021-05-04 DIAGNOSIS — I10 Essential (primary) hypertension: Secondary | ICD-10-CM | POA: Diagnosis not present

## 2021-05-04 DIAGNOSIS — R479 Unspecified speech disturbances: Secondary | ICD-10-CM

## 2021-05-04 DIAGNOSIS — E871 Hypo-osmolality and hyponatremia: Secondary | ICD-10-CM | POA: Diagnosis not present

## 2021-05-04 LAB — BASIC METABOLIC PANEL
Anion gap: 10 (ref 5–15)
BUN: 8 mg/dL (ref 8–23)
CO2: 25 mmol/L (ref 22–32)
Calcium: 8.7 mg/dL — ABNORMAL LOW (ref 8.9–10.3)
Chloride: 95 mmol/L — ABNORMAL LOW (ref 98–111)
Creatinine, Ser: 0.5 mg/dL (ref 0.44–1.00)
GFR, Estimated: >60 mL/min (ref >60)
Glucose, Bld: 111 mg/dL — ABNORMAL HIGH (ref 70–99)
Potassium: 3.5 mmol/L (ref 3.5–5.1)
Sodium: 130 mmol/L — ABNORMAL LOW (ref 135–145)

## 2021-05-04 LAB — LIPID PANEL
Cholesterol: 164 mg/dL (ref 0–200)
HDL: 73 mg/dL (ref >40)
LDL Cholesterol: 69 mg/dL (ref 0–99)
Total CHOL/HDL Ratio: 2.2 RATIO
Triglycerides: 112 mg/dL (ref <150)
VLDL: 22 mg/dL (ref 0–40)

## 2021-05-04 LAB — HEMOGLOBIN A1C
Hgb A1c MFr Bld: 6 % — ABNORMAL HIGH (ref 4.8–5.6)
Mean Plasma Glucose: 125.5 mg/dL

## 2021-05-04 LAB — ECHOCARDIOGRAM COMPLETE
Area-P 1/2: 2.97 cm2
S' Lateral: 3.4 cm
Weight: 2652.57 oz

## 2021-05-04 MED ORDER — CLOPIDOGREL BISULFATE 75 MG PO TABS
75.0000 mg | ORAL_TABLET | Freq: Every day | ORAL | 0 refills | Status: AC
  Filled 2021-05-04: qty 21, 21d supply, fill #0

## 2021-05-04 MED ORDER — ASPIRIN 81 MG PO TBEC
81.0000 mg | DELAYED_RELEASE_TABLET | Freq: Every day | ORAL | 11 refills | Status: DC
  Filled 2021-05-04: qty 30, 30d supply, fill #0

## 2021-05-04 NOTE — Progress Notes (Signed)
EEG complete - results pending 

## 2021-05-04 NOTE — Progress Notes (Signed)
SLP Cancellation Note  Patient Details Name: Sandra Becker MRN: [id number] DOB: 1940/10/15   Cancelled treatment:       Reason Eval/Treat Not Completed: SLP screened, no needs identified, will sign off   Dejuan Elman, Katherene Ponto 05/04/2021, 1:25 PM

## 2021-05-04 NOTE — Progress Notes (Signed)
STROKE TEAM PROGRESS NOTE   INTERVAL HISTORY Sitting with friends when they noticed she was behaving strangely and having difficulty speaking along with transient right upper extremity weakness. She does believe she is able to recall these events. EMS was called. She denies previous episodes of this type, stroke or TIAs in the past. Not taking any AC/AP prior to admission.  Drinks wine at dinner. Counseled to reduce intake to one glass per night. We discussed plan of care including ongoing diagnostics, work up and plan of care. Her husband was at the bedside. Questions answered.   Vitals:   05/04/21 0300 05/04/21 0350 05/04/21 0608 05/04/21 0700  BP: (!) 144/68  (!) 162/67 (!) 153/70  Pulse: 68  72 68  Resp: 11  20 14   Temp:  98 F (36.7 C)    TempSrc:  Oral    SpO2: 92%  100% 96%  Weight:       CBC:  Recent Labs  Lab 05/03/21 1234 05/03/21 1239  WBC 8.3  --   NEUTROABS 4.1  --   HGB 13.5 13.6  HCT 38.8 40.0  MCV 86.2  --   PLT 335  --    Basic Metabolic Panel:  Recent Labs  Lab 05/03/21 1234 05/03/21 1239 05/04/21 0347  NA 124* 125* 130*  K 3.8 3.8 3.5  CL 89* 88* 95*  CO2 25  --  25  GLUCOSE 112* 112* 111*  BUN 9 10 8   CREATININE 0.55 0.50 0.50  CALCIUM 9.1  --  8.7*   Lipid Panel:  Recent Labs  Lab 05/04/21 0347  CHOL 164  TRIG 112  HDL 73  CHOLHDL 2.2  VLDL 22  LDLCALC 69   HgbA1c:  Recent Labs  Lab 05/04/21 0347  HGBA1C 6.0*   Urine Drug Screen:  Recent Labs  Lab 05/03/21 1423  LABOPIA NONE DETECTED  COCAINSCRNUR NONE DETECTED  LABBENZ NONE DETECTED  AMPHETMU NONE DETECTED  THCU NONE DETECTED  LABBARB NONE DETECTED    Alcohol Level  Recent Labs  Lab 05/03/21 1234  ETH <10    IMAGING past 24 hours MR BRAIN WO CONTRAST  Result Date: 05/03/2021 CLINICAL DATA:  Transient ischemic attack. EXAM: MRI HEAD WITHOUT CONTRAST TECHNIQUE: Multiplanar, multiecho pulse sequences of the brain and surrounding structures were obtained without  intravenous contrast. COMPARISON:  Head CT May 03, 2021. FINDINGS: Brain: No acute infarction, hemorrhage, hydrocephalus, extra-axial collection or mass lesion. 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 Scattered and confluent foci of T2 hyperintensity are seen within the white matter of the cerebral hemispheres and within the pons, nonspecific, most likely related to chronic small vessel ischemia. Moderate parenchymal volume loss. Vascular: Normal flow voids. Skull and upper cervical spine: Normal marrow signal. Sinuses/Orbits: Mild mucosal thickening of the bilateral ethmoid cells. Bilateral lens surgery. Other: None. IMPRESSION: 1. No acute intracranial abnormality. 2. 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 3. 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 Electronically Signed   By: Pedro Earls M.D.   On: 05/03/2021 17:42   CT HEAD CODE STROKE WO CONTRAST  Result Date: 05/03/2021 CLINICAL DATA:  Code stroke. Neuro deficit, acute, stroke suspected. EXAM: CT HEAD WITHOUT CONTRAST TECHNIQUE: Contiguous axial images were obtained from the base of the skull through the vertex without intravenous contrast. COMPARISON:  None. FINDINGS: Brain: Generalized atrophy. Chronic small-vessel ischemic changes affecting the pons, thalami, basal ganglia and hemispheric white matter. No sign of acute cortical insult. No  mass lesion, hemorrhage, hydrocephalus or extra-axial collection. Vascular: There is atherosclerotic calcification of the major vessels at the base of the brain. Skull: Negative Sinuses/Orbits: Clear/normal Other: None ASPECTS ([place] Stroke Program Early CT Score) - Ganglionic level infarction (caudate, lentiform nuclei, internal capsule, insula, M1-M3 cortex): 7 - Supraganglionic infarction (M4-M6 cortex): 3 Total score (0-10 with 10 being normal): 10 IMPRESSION: 1. No acute CT finding. Chronic small-vessel ischemic changes  throughout the brain as outlined above. 2. ASPECTS is 10. These results were communicated to Dr. Erlinda Hong at 12:51 pm on 05/03/2021 by text page via the [facility] messaging system. Electronically Signed   By: Nelson Chimes M.D.   On: 05/03/2021 12:51   CT ANGIO HEAD NECK W WO CM (CODE STROKE)  Result Date: 05/03/2021 CLINICAL DATA:  Neuro deficit, acute, stroke suspected. EXAM: CT ANGIOGRAPHY HEAD AND NECK TECHNIQUE: Multidetector CT imaging of the head and neck was performed using the standard protocol during bolus administration of intravenous contrast. Multiplanar CT image reconstructions and MIPs were obtained to evaluate the vascular anatomy. Carotid stenosis measurements (when applicable) are obtained utilizing NASCET criteria, using the distal internal carotid diameter as the denominator. CONTRAST:  53mL OMNIPAQUE IOHEXOL 350 MG/ML SOLN COMPARISON:  Head CT earlier same day. FINDINGS: CTA NECK FINDINGS Aortic arch: Aortic atherosclerosis.  Branching pattern is normal. Right carotid system: Common carotid artery widely patent to the bifurcation. Carotid bifurcation is normal without soft or calcified plaque. Cervical ICA is tortuous but widely patent. Left carotid system: Common carotid artery widely patent to the bifurcation. Carotid bifurcation is normal. Cervical ICA is tortuous but widely patent. Vertebral arteries: Both vertebral artery origins are widely patent and normal. Both vertebral arteries appear normal through the cervical region to the foramen magnum. Skeleton: Minimal mid cervical spondylosis. Other neck: No mass or lymphadenopathy. Upper chest: Normal Review of the MIP images confirms the above findings CTA HEAD FINDINGS Anterior circulation: Both internal carotid arteries are patent through the skull base and siphon regions. There is ordinary siphon atherosclerotic calcification but no stenosis greater than 30%. The anterior and middle cerebral vessels are patent. No large vessel occlusion. No  correctable proximal stenosis. No aneurysm or vascular malformation. There is some distal vessel atherosclerotic irregularity. Posterior circulation: Both vertebral arteries are widely patent to the basilar. No basilar stenosis. Posterior circulation branch vessels are normal. There is some distal vessel atherosclerotic irregularity. Venous sinuses: Patent and normal. Anatomic variants: None significant. Review of the MIP images confirms the above findings IMPRESSION: No intracranial large vessel occlusion or correctable proximal stenosis. Some distal vessel atherosclerotic irregularity. Both carotid bifurcations are widely patent. Aortic Atherosclerosis (ICD10-I70.0). Electronically Signed   By: Nelson Chimes M.D.   On: 05/03/2021 13:18    PHYSICAL EXAM Pleasant elderly Caucasian lady not in distress. . Afebrile. Head is nontraumatic. Neck is supple without bruit.    Cardiac exam no murmur or gallop. Lungs are clear to auscultation. Distal pulses are well felt.  Neurological Exam ;  Awake  Alert oriented x 3. Normal speech and language.eye movements full without nystagmus.fundi were not visualized. Vision acuity and fields appear normal. Hearing is normal. Palatal movements are normal. Face symmetric. Tongue midline. Normal strength, tone, reflexes and coordination. Normal sensation. Gait deferred.  ASSESSMENT/PLAN 80 year old female with history of hypertension, breast cancer status post mastectomy presented to ED for episode of speech difficulty, right-sided weakness, right facial droop.   Per EMS, patient was with a friend at 14 AM for lunch, she developed acute onset speech  difficulty, right facial droop and right-sided weakness.  Symptoms fluctuating with EMS, ED, patient symptoms all resolved.  BP 204/92, glucose 112.  When asked patient, she stated that her husband dropped her off with her friend, then she stopped talking for some time, cannot tell whether she had weakness or facial droop.  She  felt she is back to normal in ED. she denies any headache, dizziness, or history of migraine.  Stroke: 80 yo female who came in as a code stroke. Her imaging is negative for an acute concern. Her exam was back to normal when the patient arrived. NIHSS 0. Given negative imaging and the fact that her symptoms resolved, her presentation was thought to most likely be consistent with a TIA.  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 Chronic small-vessel ischemic changes throughout the brain as outlined above. -ASPECTS is 10.   CTA head and neck: No intracranial large vessel occlusion or correctable proximal stenosis. Some distal vessel atherosclerotic irregularity.  Both carotid bifurcations are widely patent.  Aortic Atherosclerosis (ICD10-I70.0). Cerebral angio  MRI   1. No acute intracranial abnormality. 2. 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 3. 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 2D Echo EF 94-07%, Grade I diastolic  dysfunction (impaired relaxation  EEG negative for seizure activity  LDL 69 HgbA1c 6.0 VTE prophylaxis - ambulatory     Diet   Diet Heart Room service appropriate? 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 Leonie Man. He directed the plan of care.  Charlene Brooke, NP-C  STROKE MD NOTE : I have personally obtained history,examined this patient, reviewed notes, independently viewed imaging studies, participated in medical decision making and plan of care.ROS completed by me personally and pertinent positives fully documented  I have made any additions or clarifications directly to the above note. Agree with note above.  She presented with transient episode of speech difficulties and right face and hand weakness and brain imaging and neurovascular imaging is unremarkable.  Likely left hemispheric subcortical TIA from small vessel disease.  Recommend aspirin and Plavix for 3 weeks followed by aspirin alone and aggressive risk factor modification.  Long discussion with patient and answered questions.  Discussed with Dr. Tammi Klippel.  Greater than 50% time during the 35-minute visit was spent in counseling and coordination of care about a TIA and discussion about stroke prevention and answering questions.  Antony Contras, MD Medical Director [place] Pager: [phone] 05/04/2021 6:35 PM   To contact Stroke Continuity provider, please refer to [email or web address] After hours, contact General Neurology

## 2021-05-04 NOTE — Progress Notes (Signed)
OT Cancellation Note  Patient Details Name: Sandra Becker MRN: [id number] DOB: [birth year]   Cancelled Treatment:    Reason Eval/Treat Not Completed: OT screened, no needs identified, will sign off Pt evaluated this date by PT, who reports pt is functioning at baseline. PT completed appropriate education and pt with no additional questions. NO OT needs identified at this time. OT will sign off. Thank you for referral.   Helene Kelp OTR/L Acute Rehabilitation Services Office: [place] 05/04/2021, 12:49 PM

## 2021-05-04 NOTE — ED Notes (Signed)
Tele  Breakfast Ordered 

## 2021-05-04 NOTE — Progress Notes (Signed)
[facility] PROGRESS NOTE    Progress Note  Sandra Becker  QIH:[id number] DOB: 20-Feb-1941 DOA: 05/03/2021 PCP: Kelton Pillar, MD     Brief Narrative:   Sandra Becker is an 80 y.o. 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 She will be monitored on telemetry 2D echo is pending. Will allow permissive hypertension. A1c pending Fasting lipid panel LDL less than 70 HDL greater than 40 Swallowing evaluation pending Physical therapy pending Allergy has been consulted and agreed to continued aspirin.  Euvolemic hyponatremia She is on hydrochlorothiazide it was held started on IV fluids her sodium is slowly improving.  Essential hypertension We will allow permissive hypertension, hold antihypertensive medication.  Glaucoma Continue current eyedrops.  DVT prophylaxis: lovenox Family Communication:none Status is: Observation  The patient remains OBS appropriate and will d/c before 2 midnights.  Dispo: The patient is from: Home              Anticipated d/c is to: Home              Patient currently is not medically stable to d/c.   Difficult to place patient No        Code Status:     Code Status Orders  (From admission, onward)           Start     Ordered   05/03/21 1338  Full code  Continuous        05/03/21 1338           Code Status History     This patient has a current code status but no historical code status.      Advance  Directive Documentation    Flowsheet Row Most Recent Value  Type of Advance Directive Healthcare Power of Attorney  Pre-existing out of facility DNR order (yellow form or pink MOST form) --  "MOST" Form in Place? --         IV Access:   Peripheral IV   Procedures and diagnostic studies:   MR BRAIN WO CONTRAST  Result Date: 05/03/2021 CLINICAL DATA:  Transient ischemic attack. EXAM: MRI HEAD WITHOUT CONTRAST TECHNIQUE: Multiplanar, multiecho pulse sequences of the brain and surrounding structures were obtained without intravenous contrast. COMPARISON:  Head CT May 03, 2021. FINDINGS: Brain: No acute infarction, hemorrhage, hydrocephalus, extra-axial collection or mass lesion. 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 Scattered and confluent foci of T2 hyperintensity are seen within the white matter of the cerebral hemispheres and within the pons, nonspecific, most likely related to chronic small vessel ischemia. Moderate parenchymal volume loss. Vascular: Normal flow voids. Skull and upper cervical spine: Normal marrow signal. Sinuses/Orbits: Mild mucosal thickening of the bilateral ethmoid cells. Bilateral lens surgery. Other: None. IMPRESSION: 1. No acute intracranial abnormality. 2. 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 3. 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 Electronically Signed   By: Pedro Earls M.D.   On: 05/03/2021 17:42   CT HEAD CODE STROKE WO CONTRAST  Result Date: 05/03/2021 CLINICAL DATA:  Code stroke. Neuro deficit, acute, stroke suspected. EXAM: CT HEAD WITHOUT CONTRAST TECHNIQUE: Contiguous axial images were obtained from the base of the skull through the vertex without intravenous contrast. COMPARISON:  None. FINDINGS: Brain: Generalized atrophy. Chronic small-vessel ischemic changes affecting the pons, thalami, basal ganglia and hemispheric white matter. No sign of acute cortical  insult. No mass lesion, hemorrhage, hydrocephalus or extra-axial collection. Vascular: There is atherosclerotic calcification of the major vessels at the base of the brain. Skull: Negative Sinuses/Orbits: Clear/normal Other: None ASPECTS ([place] Stroke Program Early CT Score) - Ganglionic level infarction (caudate, lentiform nuclei, internal capsule, insula, M1-M3 cortex): 7 - Supraganglionic infarction (M4-M6 cortex): 3 Total score (0-10 with 10 being normal): 10 IMPRESSION: 1. No acute CT finding. Chronic small-vessel ischemic changes throughout the brain as outlined above. 2. ASPECTS is 10. These results were communicated to Dr. Erlinda Hong at 12:51 pm on 05/03/2021 by text page via the [facility] messaging system. Electronically Signed   By: Nelson Chimes M.D.   On: 05/03/2021 12:51   CT ANGIO HEAD NECK W WO CM (CODE STROKE)  Result Date: 05/03/2021 CLINICAL DATA:  Neuro deficit, acute, stroke suspected. EXAM: CT ANGIOGRAPHY HEAD AND NECK TECHNIQUE: Multidetector CT imaging of the head and neck was performed using the standard protocol during bolus administration of intravenous contrast. Multiplanar CT image reconstructions and MIPs were obtained to evaluate the vascular anatomy. Carotid stenosis measurements (when applicable) are obtained utilizing NASCET criteria, using the distal internal carotid diameter as the denominator. CONTRAST:  24mL OMNIPAQUE IOHEXOL 350 MG/ML SOLN COMPARISON:  Head CT earlier same day. FINDINGS: CTA NECK FINDINGS Aortic arch: Aortic atherosclerosis.  Branching pattern is normal. Right carotid system: Common carotid artery widely patent to the bifurcation. Carotid bifurcation is normal without soft or calcified plaque. Cervical ICA is tortuous but widely patent. Left carotid system: Common carotid artery widely patent to the bifurcation. Carotid bifurcation is normal. Cervical ICA is tortuous but widely patent. Vertebral arteries: Both vertebral artery origins are widely patent and normal. Both  vertebral arteries appear normal through the cervical region to the foramen magnum. Skeleton: Minimal mid cervical spondylosis. Other neck: No mass or lymphadenopathy. Upper chest: Normal Review of the MIP images confirms the above findings CTA HEAD FINDINGS Anterior circulation: Both internal carotid arteries are patent through the skull base and siphon regions. There is ordinary siphon atherosclerotic calcification but no stenosis greater than 30%. The anterior and middle cerebral vessels are patent. No large vessel occlusion. No correctable proximal stenosis. No aneurysm or vascular malformation. There is some distal vessel atherosclerotic irregularity. Posterior circulation: Both vertebral arteries are widely patent to the basilar. No basilar stenosis. Posterior circulation branch vessels are normal. There is some distal vessel atherosclerotic irregularity. Venous sinuses: Patent and normal. Anatomic variants: None significant. Review of the MIP images confirms the above findings IMPRESSION: No intracranial large vessel occlusion or correctable proximal stenosis. Some distal vessel atherosclerotic irregularity. Both carotid bifurcations are widely patent. Aortic Atherosclerosis (ICD10-I70.0). Electronically Signed   By: Nelson Chimes M.D.   On: 05/03/2021 13:18     Medical Consultants:   None.   Subjective:    Sandra Becker notes her symptoms have resolved.  Objective:    Vitals:   05/04/21 0200 05/04/21 0300 05/04/21 0350 05/04/21 0608  BP: (!) 145/57 (!) 144/68  (!) 162/67  Pulse: 65 68  72  Resp: 10 11  20   Temp:   98 F (36.7 C)   TempSrc:  Oral   SpO2: 93% 92%  100%  Weight:       SpO2: 100 %   Intake/Output Summary (Last 24 hours) at 05/04/2021 0701 Last data filed at 05/04/2021 0409 Gross per 24 hour  Intake 1120 ml  Output --  Net 1120 ml   Filed Weights   05/03/21 1235  Weight: 75.2 kg    Exam: General exam: In no acute distress. Respiratory system: Good  air movement and clear to auscultation. Cardiovascular system: S1 & S2 heard, RRR. No JVD. Gastrointestinal system: Abdomen is nondistended, soft and nontender.  Extremities: No pedal edema. Skin: No rashes, lesions or ulcers Psychiatry: Judgement and insight appear normal. Mood & affect appropriate.    Data Reviewed:    Labs: Basic Metabolic Panel: Recent Labs  Lab 05/03/21 1234 05/03/21 1239 05/04/21 0347  NA 124* 125* 130*  K 3.8 3.8 3.5  CL 89* 88* 95*  CO2 25  --  25  GLUCOSE 112* 112* 111*  BUN 9 10 8   CREATININE 0.55 0.50 0.50  CALCIUM 9.1  --  8.7*   GFR CrCl cannot be calculated (Unknown ideal weight.). Liver Function Tests: Recent Labs  Lab 05/03/21 1234  AST 27  ALT 27  ALKPHOS 46  BILITOT 0.7  PROT 6.7  ALBUMIN 4.1   No results for input(s): LIPASE, AMYLASE in the last 168 hours. No results for input(s): AMMONIA in the last 168 hours. Coagulation profile Recent Labs  Lab 05/03/21 1234  INR 0.9   COVID-19 Labs  No results for input(s): DDIMER, FERRITIN, LDH, CRP in the last 72 hours.  Lab Results  Component Value Date   [place] NEGATIVE 05/03/2021    CBC: Recent Labs  Lab 05/03/21 1234 05/03/21 1239  WBC 8.3  --   NEUTROABS 4.1  --   HGB 13.5 13.6  HCT 38.8 40.0  MCV 86.2  --   PLT 335  --    Cardiac Enzymes: No results for input(s): CKTOTAL, CKMB, CKMBINDEX, TROPONINI in the last 168 hours. BNP (last 3 results) No results for input(s): PROBNP in the last 8760 hours. CBG: Recent Labs  Lab 05/03/21 1232  GLUCAP 103*   D-Dimer: No results for input(s): DDIMER in the last 72 hours. Hgb A1c: Recent Labs    05/04/21 0347  HGBA1C 6.0*   Lipid Profile: Recent Labs    05/04/21 0347  CHOL 164  HDL 73  LDLCALC 69  TRIG 112  CHOLHDL 2.2   Thyroid function studies: No results for input(s): TSH, T4TOTAL, T3FREE, THYROIDAB in the last 72 hours.  Invalid input(s): FREET3 Anemia work up: No results for input(s):  VITAMINB12, FOLATE, FERRITIN, TIBC, IRON, RETICCTPCT in the last 72 hours. Sepsis Labs: Recent Labs  Lab 05/03/21 1234  WBC 8.3   Microbiology Recent Results (from the past 240 hour(s))  Resp Panel by RT-PCR (Flu A&B, Covid) Nasopharyngeal Swab     Status: None   Collection Time: 05/03/21 12:34 PM   Specimen: Nasopharyngeal Swab; Nasopharyngeal(NP) swabs in vial transport medium  Result Value Ref Range Status   SARS Coronavirus 2 by RT PCR NEGATIVE NEGATIVE Final    Comment: (NOTE) SARS-CoV-2 target nucleic acids are NOT DETECTED.  The SARS-CoV-2 RNA is generally detectable in upper respiratory specimens during the acute phase of infection. The lowest concentration of SARS-CoV-2 viral copies this assay can detect is 138 copies/mL. A negative result does not preclude SARS-Cov-2 infection and should not be used as the sole basis for treatment or other  patient management decisions. A negative result may occur with  improper specimen collection/handling, submission of specimen other than nasopharyngeal swab, presence of viral mutation(s) within the areas targeted by this assay, and inadequate number of viral copies(<138 copies/mL). A negative result must be combined with clinical observations, patient history, and epidemiological information. The expected result is Negative.  Fact Sheet for Patients:  EntrepreneurPulse.com.au  Fact Sheet for Healthcare Providers:  IncredibleEmployment.be  This test is no t yet approved or cleared by the [place] FDA and  has been authorized for detection and/or diagnosis of SARS-CoV-2 by FDA under an Emergency Use Authorization (EUA). This EUA will remain  in effect (meaning this test can be used) for the duration of the COVID-19 declaration under Section 564(b)(1) of the Act, 21 U.S.C.section 360bbb-3(b)(1), unless the authorization is terminated  or revoked sooner.       Influenza A by PCR NEGATIVE  NEGATIVE Final   Influenza B by PCR NEGATIVE NEGATIVE Final    Comment: (NOTE) The Xpert Xpress SARS-CoV-2/FLU/RSV plus assay is intended as an aid in the diagnosis of influenza from Nasopharyngeal swab specimens and should not be used as a sole basis for treatment. Nasal washings and aspirates are unacceptable for Xpert Xpress SARS-CoV-2/FLU/RSV testing.  Fact Sheet for Patients: EntrepreneurPulse.com.au  Fact Sheet for Healthcare Providers: IncredibleEmployment.be  This test is not yet approved or cleared by the [place] FDA and has been authorized for detection and/or diagnosis of SARS-CoV-2 by FDA under an Emergency Use Authorization (EUA). This EUA will remain in effect (meaning this test can be used) for the duration of the COVID-19 declaration under Section 564(b)(1) of the Act, 21 U.S.C. section 360bbb-3(b)(1), unless the authorization is terminated or revoked.  Performed at [facility], [place] [address] [place], [place] [address]      Medications:     stroke: mapping our early stages of recovery book   Does not apply Once   aspirin EC  81 mg Oral Daily   brinzolamide  1 drop Both Eyes BID   clopidogrel  75 mg Oral Daily   enoxaparin (LOVENOX) injection  40 mg Subcutaneous Q24H   rosuvastatin  5 mg Oral Daily   Continuous Infusions:    LOS: 0 days   Charlynne Cousins  [facility]  05/04/2021, 7:01 AM

## 2021-05-04 NOTE — Procedures (Signed)
Patient Name: JERICHO CIESLIK  MRN: [id number]  Epilepsy Attending: Lora Havens  Referring Physician/Provider: Charlene Brooke , NP Date: 05/04/2021  Duration: 23.31 mins  Patient history: 80 year old female who was brought in as a stroke alert.  Per EMS, patient was with a friend at 53 AM for lunch, she developed acute onset speech difficulty, right facial droop and right-sided weakness.  MRI brain did not show any evidence of acute stroke.  EEG to evaluate for seizures.  Level of alertness: Awake, asleep  AEDs during EEG study: None  Technical aspects: This EEG study was done with scalp electrodes positioned according to the 10-20 International system of electrode placement. Electrical activity was acquired at a sampling rate of 500Hz  and reviewed with a high frequency filter of 70Hz  and a low frequency filter of 1Hz . EEG data were recorded continuously and digitally stored.   Description: The posterior dominant rhythm consists of 8Hz  activity of moderate voltage (25-35 uV) seen predominantly in posterior head regions, symmetric and reactive to eye opening and eye closing. Sleep was characterized by vertex waves, sleep spindles (12 to 14 Hz), maximal frontocentral region. Hyperventilation and photic stimulation were not performed.     IMPRESSION: This study is within normal limits. No seizures or epileptiform discharges were seen throughout the recording.  Naphtali Zywicki [name]

## 2021-05-04 NOTE — Progress Notes (Signed)
Echocardiogram 2D Echocardiogram has been performed.  Oneal Deputy Kaitlyn Skowron RDCS 05/04/2021, 12:02 PM

## 2021-05-04 NOTE — Discharge Summary (Signed)
Physician Discharge Summary  Sandra Becker XIH:[id number] DOB: 1940/08/01 DOA: 05/03/2021  PCP: Kelton Pillar, MD  Admit date: 05/03/2021 Discharge date: 05/04/2021  Admitted From: Home  Disposition:  Home   Recommendations for Outpatient Follow-up:  Follow up with Neurology in 1-2 weeks Please obtain BMP/CBC in one week   Home Health:No  Equipment/Devices:None   Discharge Condition:Stable  CODE STATUS:Full  Diet recommendation: Heart Healthy   Brief/Interim Summary: 80 y.o. 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 Neurology was consulted recommended an MRI that showed no acute abnormalities. A 2D echo was done which we will follow-up as an outpatient. A1c of 6.0. She was monitored on telemetry with no events twelve-lead EKG shows sinus rhythm 2D echo showed an EF of 60% with grade 1 diastolic dysfunction. Her antihypertensive medications were held to allow permissive hypertension. Her fasting lipid panel showed an LDL less than 70 and HDL greater than 40. A swallowing evaluation was done there had no restriction. Physical therapy evaluated the patient and recommended no home health PT. She will go home on aspirin and Plavix for 3 weeks, after 3 weeks she will drop the Plavix and continue the aspirin.  Euvolemic hyponatremia: She was on hydrochlorothiazide this was held she was started  on IV fluids and her sodium improved she will resume her antihypertensive medication as an outpatient.  Essential hypertension: During her hospital stay her antihypertensive medications were held her blood pressure started to improve slowly she will restart her home medications as an outpatient in 2 days.  Glaucoma: No changes to her medication.   Discharge Instructions  Discharge Instructions     Diet - low sodium heart healthy   Complete by: As directed    Increase activity slowly   Complete by: As directed       Allergies as of 05/04/2021       Reactions   Crab [shellfish Allergy] Other (See Comments)   From skin test   Fosamax [alendronate Sodium] Other (See Comments)   Difficult swallowing   Ace Inhibitors Cough        Medication List     TAKE these medications    aspirin 81 MG EC tablet Take 1 tablet (81 mg total) by mouth daily. Swallow whole. Start taking on: May 05, 2021   brinzolamide 1 % ophthalmic suspension Commonly known as: AZOPT Place 1 drop into both eyes 2 (two) times daily.   chlorthalidone 25 MG tablet Commonly known as: HYGROTON Take 25 mg by mouth daily.   clopidogrel 75 MG tablet Commonly known as: PLAVIX Take 1 tablet (75 mg total) by mouth daily for 21 days. Start taking on: May 05, 2021   losartan 50 MG tablet Commonly known as: COZAAR Take 50 mg by mouth daily.   Moderna COVID-19 Vaccine 100 MCG/0.5ML injection Generic drug: COVID-19 mRNA vaccine (Moderna) Inject into the muscle.   rosuvastatin 5 MG tablet Commonly known as: CRESTOR Take 5 mg  by mouth daily.   timolol 0.5 % ophthalmic solution Commonly known as: TIMOPTIC Place 1 drop into both eyes 2 (two) times daily.   valsartan 320 MG tablet Commonly known as: DIOVAN Take 320 mg by mouth daily.        Allergies  Allergen Reactions   Crab [Shellfish Allergy] Other (See Comments)    From skin test   Fosamax [Alendronate Sodium] Other (See Comments)     Difficult swallowing   Ace Inhibitors Cough    Consultations: Neurology   Procedures/Studies: MR BRAIN WO CONTRAST  Result Date: 05/03/2021 CLINICAL DATA:  Transient ischemic attack. EXAM: MRI HEAD WITHOUT CONTRAST TECHNIQUE: Multiplanar, multiecho pulse sequences of the brain and surrounding structures were obtained without intravenous contrast. COMPARISON:  Head CT May 03, 2021. FINDINGS: Brain: No acute infarction, hemorrhage, hydrocephalus, extra-axial collection or mass lesion. 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 Scattered and confluent foci of T2 hyperintensity are seen within the white matter of the cerebral hemispheres and within the pons, nonspecific, most likely related to chronic small vessel ischemia. Moderate parenchymal volume loss. Vascular: Normal flow voids. Skull and upper cervical spine: Normal marrow signal. Sinuses/Orbits: Mild mucosal thickening of the bilateral ethmoid cells. Bilateral lens surgery. Other: None. IMPRESSION: 1. No acute intracranial abnormality. 2. 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 3. 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 Electronically Signed   By: Pedro Earls M.D.   On: 05/03/2021 17:42   EEG adult  Result Date: 05/04/2021 Lora Havens, MD     05/04/2021 11:29 AM Patient Name: Sandra Becker MRN: [id number] Epilepsy Attending: Lora Havens Referring Physician/Provider: Charlene Brooke , NP Date: 05/04/2021 Duration: 23.31 mins Patient history: 80 year old female who was brought in as a stroke alert.  Per EMS, patient was with a friend at 40 AM for lunch, she developed acute onset speech difficulty, right facial droop and right-sided weakness.  MRI brain did not show any evidence of acute stroke.  EEG to evaluate for seizures. Level of alertness: Awake, asleep AEDs during EEG study: None Technical aspects: This EEG study was done with scalp  electrodes positioned according to the 10-20 International system of electrode placement. Electrical activity was acquired at a sampling rate of 500Hz  and reviewed with a high frequency filter of 70Hz  and a low frequency filter of 1Hz . EEG data were recorded continuously and digitally stored. Description: The posterior dominant rhythm consists of 8Hz  activity of moderate voltage (25-35 uV) seen predominantly in posterior head regions, symmetric and reactive to eye opening and eye closing. Sleep was characterized by vertex waves, sleep spindles (12 to 14 Hz), maximal frontocentral region. Hyperventilation and photic stimulation were not performed.   IMPRESSION: This study is within normal limits. No seizures or epileptiform discharges were seen throughout the recording. Lora Havens   ECHOCARDIOGRAM COMPLETE  Result Date: 05/04/2021    ECHOCARDIOGRAM REPORT   Patient Name:   Sandra Becker Date of Exam: 05/04/2021 Medical Rec #:  [id number]          Height:       65.0 in Accession #:    [id number]         Weight:       165.8 lb Date of Birth:  June 11, 1941          BSA:          1.827 m Patient Age:    37 years  BP:           153/70 mmHg Patient Gender: F                  HR:           71 bpm. Exam Location:  Inpatient Procedure: 2D Echo, Color Doppler and Cardiac Doppler Indications:    TIA  History:        Patient has no prior history of Echocardiogram examinations.                 Risk Factors:Hypertension and Dyslipidemia.  Sonographer:    Raquel Sarna Senior RDCS Referring Phys: [id number] Lequita Halt  Sonographer Comments: Suboptimal apical window due to scarring IMPRESSIONS  1. Left ventricular ejection fraction, by estimation, is 60 to 65%. The left ventricle has normal function. The left ventricle has no regional wall motion abnormalities. Left ventricular diastolic parameters are consistent with Grade I diastolic dysfunction (impaired relaxation).  2. Right ventricular systolic function is normal.  The right ventricular size is normal. There is normal pulmonary artery systolic pressure.  3. The mitral valve is normal in structure. No evidence of mitral valve regurgitation. No evidence of mitral stenosis. Moderate mitral annular calcification.  4. The aortic valve is calcified. Aortic valve regurgitation is not visualized. Mild to moderate aortic valve sclerosis/calcification is present, without any evidence of aortic stenosis.  5. The inferior vena cava is normal in size with greater than 50% respiratory variability, suggesting right atrial pressure of 3 mmHg. Conclusion(s)/Recommendation(s): No intracardiac source of embolism detected on this transthoracic study. A transesophageal echocardiogram is recommended to exclude cardiac source of embolism if clinically indicated. FINDINGS  Left Ventricle: Left ventricular ejection fraction, by estimation, is 60 to 65%. The left ventricle has normal function. The left ventricle has no regional wall motion abnormalities. The left ventricular internal cavity size was normal in size. There is  no left ventricular hypertrophy. Left ventricular diastolic parameters are consistent with Grade I diastolic dysfunction (impaired relaxation). Right Ventricle: The right ventricular size is normal. No increase in right ventricular wall thickness. Right ventricular systolic function is normal. There is normal pulmonary artery systolic pressure. The tricuspid regurgitant velocity is 2.49 m/s, and  with an assumed right atrial pressure of 3 mmHg, the estimated right ventricular systolic pressure is 93.2 mmHg. Left Atrium: Left atrial size was normal in size. Right Atrium: Right atrial size was normal in size. Pericardium: There is no evidence of pericardial effusion. Mitral Valve: The mitral valve is normal in structure. Moderate mitral annular calcification. No evidence of mitral valve regurgitation. No evidence of mitral valve stenosis. Tricuspid Valve: The tricuspid valve is  normal in structure. Tricuspid valve regurgitation is not demonstrated. No evidence of tricuspid stenosis. Aortic Valve: The aortic valve is calcified. Aortic valve regurgitation is not visualized. Mild to moderate aortic valve sclerosis/calcification is present, without any evidence of aortic stenosis. Pulmonic Valve: The pulmonic valve was normal in structure. Pulmonic valve regurgitation is not visualized. No evidence of pulmonic stenosis. Aorta: The aortic root is normal in size and structure. Venous: The inferior vena cava is normal in size with greater than 50% respiratory variability, suggesting right atrial pressure of 3 mmHg. IAS/Shunts: No atrial level shunt detected by color flow Doppler.  LEFT VENTRICLE PLAX 2D LVIDd:         4.30 cm   Diastology LVIDs:         3.40 cm   LV e' medial:    5.66 cm/s  LV PW:         0.90 cm   LV E/e' medial:  10.4 LV IVS:        0.90 cm   LV e' lateral:   5.22 cm/s LVOT diam:     1.80 cm   LV E/e' lateral: 11.2 LV SV:         50 LV SV Index:   27 LVOT Area:     2.54 cm  RIGHT VENTRICLE RV S prime:     12.60 cm/s TAPSE (M-mode): 2.6 cm LEFT ATRIUM             Index        RIGHT ATRIUM           Index LA diam:        3.30 cm 1.81 cm/m   RA Area:     11.70 cm LA Vol (A2C):   37.0 ml 20.26 ml/m  RA Volume:   25.90 ml  14.18 ml/m LA Vol (A4C):   24.1 ml 13.19 ml/m LA Biplane Vol: 30.0 ml 16.42 ml/m  AORTIC VALVE LVOT Vmax:   75.70 cm/s LVOT Vmean:  61.800 cm/s LVOT VTI:    0.195 m  AORTA Ao Root diam: 3.00 cm Ao Asc diam:  2.80 cm MITRAL VALVE               TRICUSPID VALVE MV Area (PHT): 2.97 cm    TR Peak grad:   24.8 mmHg MV Decel Time: 255 msec    TR Vmax:        249.00 cm/s MV E velocity: 58.70 cm/s MV A velocity: 89.10 cm/s  SHUNTS MV E/A ratio:  0.66        Systemic VTI:  0.20 m                            Systemic Diam: 1.80 cm Candee Furbish MD Electronically signed by Candee Furbish MD Signature Date/Time: 05/04/2021/12:08:35 PM    Final    CT HEAD CODE STROKE WO  CONTRAST  Result Date: 05/03/2021 CLINICAL DATA:  Code stroke. Neuro deficit, acute, stroke suspected. EXAM: CT HEAD WITHOUT CONTRAST TECHNIQUE: Contiguous axial images were obtained from the base of the skull through the vertex without intravenous contrast. COMPARISON:  None. FINDINGS: Brain: Generalized atrophy. Chronic small-vessel ischemic changes affecting the pons, thalami, basal ganglia and hemispheric white matter. No sign of acute cortical insult. No mass lesion, hemorrhage, hydrocephalus or extra-axial collection. Vascular: There is atherosclerotic calcification of the major vessels at the base of the brain. Skull: Negative Sinuses/Orbits: Clear/normal Other: None ASPECTS ([place] Stroke Program Early CT Score) - Ganglionic level infarction (caudate, lentiform nuclei, internal capsule, insula, M1-M3 cortex): 7 - Supraganglionic infarction (M4-M6 cortex): 3 Total score (0-10 with 10 being normal): 10 IMPRESSION: 1. No acute CT finding. Chronic small-vessel ischemic changes throughout the brain as outlined above. 2. ASPECTS is 10. These results were communicated to Dr. Erlinda Hong at 12:51 pm on 05/03/2021 by text page via the [facility] messaging system. Electronically Signed   By: Nelson Chimes M.D.   On: 05/03/2021 12:51   CT ANGIO HEAD NECK W WO CM (CODE STROKE)  Result Date: 05/03/2021 CLINICAL DATA:  Neuro deficit, acute, stroke suspected. EXAM: CT ANGIOGRAPHY HEAD AND NECK TECHNIQUE: Multidetector CT imaging of the head and neck was performed using the standard protocol during bolus administration of intravenous contrast. Multiplanar CT image reconstructions and MIPs were obtained to  evaluate the vascular anatomy. Carotid stenosis measurements (when applicable) are obtained utilizing NASCET criteria, using the distal internal carotid diameter as the denominator. CONTRAST:  21mL OMNIPAQUE IOHEXOL 350 MG/ML SOLN COMPARISON:  Head CT earlier same day. FINDINGS: CTA NECK FINDINGS Aortic arch: Aortic  atherosclerosis.  Branching pattern is normal. Right carotid system: Common carotid artery widely patent to the bifurcation. Carotid bifurcation is normal without soft or calcified plaque. Cervical ICA is tortuous but widely patent. Left carotid system: Common carotid artery widely patent to the bifurcation. Carotid bifurcation is normal. Cervical ICA is tortuous but widely patent. Vertebral arteries: Both vertebral artery origins are widely patent and normal. Both vertebral arteries appear normal through the cervical region to the foramen magnum. Skeleton: Minimal mid cervical spondylosis. Other neck: No mass or lymphadenopathy. Upper chest: Normal Review of the MIP images confirms the above findings CTA HEAD FINDINGS Anterior circulation: Both internal carotid arteries are patent through the skull base and siphon regions. There is ordinary siphon atherosclerotic calcification but no stenosis greater than 30%. The anterior and middle cerebral vessels are patent. No large vessel occlusion. No correctable proximal stenosis. No aneurysm or vascular malformation. There is some distal vessel atherosclerotic irregularity. Posterior circulation: Both vertebral arteries are widely patent to the basilar. No basilar stenosis. Posterior circulation branch vessels are normal. There is some distal vessel atherosclerotic irregularity. Venous sinuses: Patent and normal. Anatomic variants: None significant. Review of the MIP images confirms the above findings IMPRESSION: No intracranial large vessel occlusion or correctable proximal stenosis. Some distal vessel atherosclerotic irregularity. Both carotid bifurcations are widely patent. Aortic Atherosclerosis (ICD10-I70.0). Electronically Signed   By: Nelson Chimes M.D.   On: 05/03/2021 13:18      Subjective: No complains  Discharge Exam: Vitals:   05/04/21 0830 05/04/21 1100  BP: (!) 173/73 (!) 143/69  Pulse: 80 69  Resp: 16 13  Temp:    SpO2: 98% 97%   Vitals:    05/04/21 0700 05/04/21 0800 05/04/21 0830 05/04/21 1100  BP: (!) 153/70 (!) 175/82 (!) 173/73 (!) 143/69  Pulse: 68 82 80 69  Resp: 14 14 16 13   Temp:      TempSrc:      SpO2: 96% 92% 98% 97%  Weight:        General: Pt is alert, awake, not in acute distress Cardiovascular: RRR, S1/S2 +, no rubs, no gallops Respiratory: CTA bilaterally, no wheezing, no rhonchi Abdominal: Soft, NT, ND, bowel sounds + Extremities: no edema, no cyanosis    The results of significant diagnostics from this hospitalization (including imaging, microbiology, ancillary and laboratory) are listed below for reference.     Microbiology: Recent Results (from the past 240 hour(s))  Resp Panel by RT-PCR (Flu A&B, Covid) Nasopharyngeal Swab     Status: None   Collection Time: 05/03/21 12:34 PM   Specimen: Nasopharyngeal Swab; Nasopharyngeal(NP) swabs in vial transport medium  Result Value Ref Range Status   SARS Coronavirus 2 by RT PCR NEGATIVE NEGATIVE Final    Comment: (NOTE) SARS-CoV-2 target nucleic acids are NOT DETECTED.  The SARS-CoV-2 RNA is generally detectable in upper respiratory specimens during the acute phase of infection. The lowest concentration of SARS-CoV-2 viral copies this assay can detect is 138 copies/mL. A negative result does not preclude SARS-Cov-2 infection and should not be used as the sole basis for treatment or other patient management decisions. A negative result may occur with  improper specimen collection/handling, submission of specimen other than nasopharyngeal swab, presence of viral  mutation(s) within the areas targeted by this assay, and inadequate number of viral copies(<138 copies/mL). A negative result must be combined with clinical observations, patient history, and epidemiological information. The expected result is Negative.  Fact Sheet for Patients:  EntrepreneurPulse.com.au  Fact Sheet for Healthcare Providers:   IncredibleEmployment.be  This test is no t yet approved or cleared by the [place] FDA and  has been authorized for detection and/or diagnosis of SARS-CoV-2 by FDA under an Emergency Use Authorization (EUA). This EUA will remain  in effect (meaning this test can be used) for the duration of the COVID-19 declaration under Section 564(b)(1) of the Act, 21 U.S.C.section 360bbb-3(b)(1), unless the authorization is terminated  or revoked sooner.       Influenza A by PCR NEGATIVE NEGATIVE Final   Influenza B by PCR NEGATIVE NEGATIVE Final    Comment: (NOTE) The Xpert Xpress SARS-CoV-2/FLU/RSV plus assay is intended as an aid in the diagnosis of influenza from Nasopharyngeal swab specimens and should not be used as a sole basis for treatment. Nasal washings and aspirates are unacceptable for Xpert Xpress SARS-CoV-2/FLU/RSV testing.  Fact Sheet for Patients: EntrepreneurPulse.com.au  Fact Sheet for Healthcare Providers: IncredibleEmployment.be  This test is not yet approved or cleared by the [place] FDA and has been authorized for detection and/or diagnosis of SARS-CoV-2 by FDA under an Emergency Use Authorization (EUA). This EUA will remain in effect (meaning this test can be used) for the duration of the COVID-19 declaration under Section 564(b)(1) of the Act, 21 U.S.C. section 360bbb-3(b)(1), unless the authorization is terminated or revoked.  Performed at [facility], [place] [address], [place], [place] [address]      Labs: BNP (last 3 results) No results for input(s): BNP in the last 8760 hours. Basic Metabolic Panel: Recent Labs  Lab 05/03/21 1234 05/03/21 1239 05/04/21 0347  NA 124* 125* 130*  K 3.8 3.8 3.5  CL 89* 88* 95*  CO2 25  --  25  GLUCOSE 112* 112* 111*  BUN 9 10 8   CREATININE 0.55 0.50 0.50  CALCIUM 9.1  --  8.7*   Liver Function Tests: Recent Labs  Lab 05/03/21 1234  AST 27   ALT 27  ALKPHOS 46  BILITOT 0.7  PROT 6.7  ALBUMIN 4.1   No results for input(s): LIPASE, AMYLASE in the last 168 hours. No results for input(s): AMMONIA in the last 168 hours. CBC: Recent Labs  Lab 05/03/21 1234 05/03/21 1239  WBC 8.3  --   NEUTROABS 4.1  --   HGB 13.5 13.6  HCT 38.8 40.0  MCV 86.2  --   PLT 335  --    Cardiac Enzymes: No results for input(s): CKTOTAL, CKMB, CKMBINDEX, TROPONINI in the last 168 hours. BNP: Invalid input(s): POCBNP CBG: Recent Labs  Lab 05/03/21 1232  GLUCAP 103*   D-Dimer No results for input(s): DDIMER in the last 72 hours. Hgb A1c Recent Labs    05/04/21 0347  HGBA1C 6.0*   Lipid Profile Recent Labs    05/04/21 0347  CHOL 164  HDL 73  LDLCALC 69  TRIG 112  CHOLHDL 2.2   Thyroid function studies No results for input(s): TSH, T4TOTAL, T3FREE, THYROIDAB in the last 72 hours.  Invalid input(s): FREET3 Anemia work up No results for input(s): VITAMINB12, FOLATE, FERRITIN, TIBC, IRON, RETICCTPCT in the last 72 hours. Urinalysis    Component Value Date/Time   COLORURINE STRAW (A) 05/03/2021 [facility] 05/03/2021 1423  LABSPEC 1.016 05/03/2021 1423   PHURINE 8.0 05/03/2021 1423   GLUCOSEU NEGATIVE 05/03/2021 1423   HGBUR NEGATIVE 05/03/2021 [place] 05/03/2021 1423   [place] 05/03/2021 1423   PROTEINUR NEGATIVE 05/03/2021 1423   UROBILINOGEN 0.2 04/27/2011 2131   NITRITE NEGATIVE 05/03/2021 1423   LEUKOCYTESUR NEGATIVE 05/03/2021 1423   Sepsis Labs Invalid input(s): PROCALCITONIN,  WBC,  LACTICIDVEN Microbiology Recent Results (from the past 240 hour(s))  Resp Panel by RT-PCR (Flu A&B, Covid) Nasopharyngeal Swab     Status: None   Collection Time: 05/03/21 12:34 PM   Specimen: Nasopharyngeal Swab; Nasopharyngeal(NP) swabs in vial transport medium  Result Value Ref Range Status   SARS Coronavirus 2 by RT PCR NEGATIVE NEGATIVE Final    Comment: (NOTE) SARS-CoV-2  target nucleic acids are NOT DETECTED.  The SARS-CoV-2 RNA is generally detectable in upper respiratory specimens during the acute phase of infection. The lowest concentration of SARS-CoV-2 viral copies this assay can detect is 138 copies/mL. A negative result does not preclude SARS-Cov-2 infection and should not be used as the sole basis for treatment or other patient management decisions. A negative result may occur with  improper specimen collection/handling, submission of specimen other than nasopharyngeal swab, presence of viral mutation(s) within the areas targeted by this assay, and inadequate number of viral copies(<138 copies/mL). A negative result must be combined with clinical observations, patient history, and epidemiological information. The expected result is Negative.  Fact Sheet for Patients:  EntrepreneurPulse.com.au  Fact Sheet for Healthcare Providers:  IncredibleEmployment.be  This test is no t yet approved or cleared by the [place] FDA and  has been authorized for detection and/or diagnosis of SARS-CoV-2 by FDA under an Emergency Use Authorization (EUA). This EUA will remain  in effect (meaning this test can be used) for the duration of the COVID-19 declaration under Section 564(b)(1) of the Act, 21 U.S.C.section 360bbb-3(b)(1), unless the authorization is terminated  or revoked sooner.       Influenza A by PCR NEGATIVE NEGATIVE Final   Influenza B by PCR NEGATIVE NEGATIVE Final    Comment: (NOTE) The Xpert Xpress SARS-CoV-2/FLU/RSV plus assay is intended as an aid in the diagnosis of influenza from Nasopharyngeal swab specimens and should not be used as a sole basis for treatment. Nasal washings and aspirates are unacceptable for Xpert Xpress SARS-CoV-2/FLU/RSV testing.  Fact Sheet for Patients: EntrepreneurPulse.com.au  Fact Sheet for Healthcare  Providers: IncredibleEmployment.be  This test is not yet approved or cleared by the [place] FDA and has been authorized for detection and/or diagnosis of SARS-CoV-2 by FDA under an Emergency Use Authorization (EUA). This EUA will remain in effect (meaning this test can be used) for the duration of the COVID-19 declaration under Section 564(b)(1) of the Act, 21 U.S.C. section 360bbb-3(b)(1), unless the authorization is terminated or revoked.  Performed at [facility], [place] [address], [place], [place] [address]     SIGNED:   Charlynne Cousins, MD  [facility] 05/04/2021, 1:42 PM Pager   If 7PM-7AM, please contact night-coverage [email or web address] Password [email or web address]

## 2021-05-04 NOTE — Evaluation (Signed)
Physical Therapy Evaluation and Discharge Patient Details Name: Sandra Becker MRN: [id number] DOB: [birth year] Today's Date: 05/04/2021  History of Present Illness  Pt is an 80 y/o female admitted secondary to slurred speech and R extremity weakness. MRI negative. PMH includes breast cancer s/p bilateral mastectomy, glaucoma, HTN.  Clinical Impression  Patient evaluated by Physical Therapy with no further acute PT needs identified. All education has been completed and the patient has no further questions. Pt overall steady with mobility tasks and at a mod I to independent level. Able to perform DGI tasks without LOB. Educated about "BE FAST" acronym in recognizing CVA symptoms. See below for any follow-up Physical Therapy or equipment needs. PT is signing off. Thank you for this referral. If needs change, please re-consult.         Recommendations for follow up therapy are one component of a multi-disciplinary discharge planning process, led by the attending physician.  Recommendations may be updated based on patient status, additional functional criteria and insurance authorization.  Follow Up Recommendations No PT follow up    Equipment Recommendations  None recommended by PT    Recommendations for Other Services       Precautions / Restrictions Precautions Precautions: None Restrictions Weight Bearing Restrictions: No      Mobility  Bed Mobility Overal bed mobility: Independent                  Transfers Overall transfer level: Independent                  Ambulation/Gait Ambulation/Gait assistance: Modified independent (Device/Increase time) Gait Distance (Feet): 100 Feet Assistive device: None Gait Pattern/deviations: WFL(Within Functional Limits) Gait velocity: mildly slower   General Gait Details: Mildly slower gait, but overall steady. Able to perform DGI tasks without LOB.  Stairs            Wheelchair Mobility    Modified Rankin  (Stroke Patients Only)       Balance Overall balance assessment: Modified Independent                               Standardized Balance Assessment Standardized Balance Assessment : Dynamic Gait Index   Dynamic Gait Index Level Surface: Mild Impairment Gait with Horizontal Head Turns: Normal Gait with Vertical Head Turns: Normal Gait and Pivot Turn: Mild Impairment Step Over Obstacle: Normal Step Around Obstacles: Normal       Pertinent Vitals/Pain Pain Assessment: No/denies pain    Home Living Family/patient expects to be discharged to:: Private residence Living Arrangements: Spouse/significant other Available Help at Discharge: Family Type of Home: Independent living facility Home Access: Elevator     Home Layout: One level Home Equipment: None      Prior Function Level of Independence: Independent               Hand Dominance        Extremity/Trunk Assessment   Upper Extremity Assessment Upper Extremity Assessment: Overall WFL for tasks assessed    Lower Extremity Assessment Lower Extremity Assessment: Overall WFL for tasks assessed    Cervical / Trunk Assessment Cervical / Trunk Assessment: Normal  Communication   Communication: No difficulties  Cognition Arousal/Alertness: Awake/alert Behavior During Therapy: WFL for tasks assessed/performed Overall Cognitive Status: Within Functional Limits for tasks assessed  General Comments General comments (skin integrity, edema, etc.): Educated about "BE FAST" acronym in recognizing CVA symptoms.    Exercises     Assessment/Plan    PT Assessment Patent does not need any further PT services  PT Problem List         PT Treatment Interventions      PT Goals (Current goals can be found in the Care Plan section)  Acute Rehab PT Goals Patient Stated Goal: to go home PT Goal Formulation: With patient Time For Goal Achievement:  05/04/21 Potential to Achieve Goals: Good    Frequency     Barriers to discharge        Co-evaluation               AM-PAC PT "6 Clicks" Mobility  Outcome Measure Help needed turning from your back to your side while in a flat bed without using bedrails?: None Help needed moving from lying on your back to sitting on the side of a flat bed without using bedrails?: None Help needed moving to and from a bed to a chair (including a wheelchair)?: None Help needed standing up from a chair using your arms (e.g., wheelchair or bedside chair)?: None Help needed to walk in hospital room?: None Help needed climbing 3-5 steps with a railing? : A Little 6 Click Score: 23    End of Session   Activity Tolerance: Patient tolerated treatment well Patient left: in bed;with call bell/phone within reach (on stretcher in ED with EEG staff present) Nurse Communication: Mobility status PT Visit Diagnosis: Other symptoms and signs involving the nervous system (R29.898)    Time: 9563-8756 PT Time Calculation (min) (ACUTE ONLY): 10 min   Charges:   PT Evaluation $PT Eval Low Complexity: 1 Low          Sandra Becker, DPT  Acute Rehabilitation Services  Pager: [phone] Office: [phone]   Sandra Becker 05/04/2021, 10:24 AM

## 2021-05-14 ENCOUNTER — Telehealth: Payer: Self-pay | Admitting: Pharmacist

## 2021-05-14 NOTE — Telephone Encounter (Signed)
Pharmacy Transitions of Care   Called patient and received message: "Your call cannot be completed at this time. Please hang up and try your call again later."   Will retry call in 2-3 business days.

## 2021-05-17 ENCOUNTER — Telehealth (HOSPITAL_COMMUNITY): Payer: Self-pay | Admitting: Pharmacist

## 2021-05-17 ENCOUNTER — Other Ambulatory Visit (HOSPITAL_COMMUNITY): Payer: Self-pay

## 2021-05-17 DIAGNOSIS — I10 Essential (primary) hypertension: Secondary | ICD-10-CM | POA: Diagnosis not present

## 2021-05-17 DIAGNOSIS — E7800 Pure hypercholesterolemia, unspecified: Secondary | ICD-10-CM | POA: Diagnosis not present

## 2021-05-17 DIAGNOSIS — Z09 Encounter for follow-up examination after completed treatment for conditions other than malignant neoplasm: Secondary | ICD-10-CM | POA: Diagnosis not present

## 2021-05-17 DIAGNOSIS — G459 Transient cerebral ischemic attack, unspecified: Secondary | ICD-10-CM | POA: Diagnosis not present

## 2021-05-17 NOTE — Telephone Encounter (Signed)
Transitions of Care Pharmacy   Call attempted for a pharmacy transitions of care follow-up. Unable to leave VM.  "Call cannot be completed at this time. Please try your call again later."  Call attempt #2. Will follow-up in 2-3 days.

## 2021-05-18 ENCOUNTER — Telehealth (HOSPITAL_COMMUNITY): Payer: Self-pay | Admitting: Pharmacist

## 2021-05-18 NOTE — Telephone Encounter (Signed)
Transitions of Care Pharmacy   Call attempted for a pharmacy transitions of care follow-up. HIPAA appropriate voicemail was left with call back information provided.   Call attempt #3. No other follow-up will be provided at this time.

## 2021-05-21 DIAGNOSIS — M1612 Unilateral primary osteoarthritis, left hip: Secondary | ICD-10-CM | POA: Diagnosis not present

## 2021-05-21 DIAGNOSIS — M25552 Pain in left hip: Secondary | ICD-10-CM | POA: Diagnosis not present

## 2021-05-21 DIAGNOSIS — M7062 Trochanteric bursitis, left hip: Secondary | ICD-10-CM | POA: Diagnosis not present

## 2021-06-24 ENCOUNTER — Other Ambulatory Visit: Payer: Self-pay

## 2021-06-24 ENCOUNTER — Inpatient Hospital Stay (HOSPITAL_COMMUNITY)
Admission: EM | Admit: 2021-06-24 | Discharge: 2021-06-28 | DRG: 065 | Disposition: A | Discharge status: Rehabilitation Facility | Payer: Medicare Other | Attending: Internal Medicine | Admitting: Internal Medicine

## 2021-06-24 ENCOUNTER — Encounter (HOSPITAL_COMMUNITY): Payer: Self-pay | Admitting: Emergency Medicine

## 2021-06-24 ENCOUNTER — Emergency Department (HOSPITAL_COMMUNITY): Payer: Medicare Other

## 2021-06-24 DIAGNOSIS — R569 Unspecified convulsions: Secondary | ICD-10-CM | POA: Diagnosis not present

## 2021-06-24 DIAGNOSIS — R29700 NIHSS score 0: Secondary | ICD-10-CM | POA: Diagnosis present

## 2021-06-24 DIAGNOSIS — I10 Essential (primary) hypertension: Secondary | ICD-10-CM | POA: Diagnosis present

## 2021-06-24 DIAGNOSIS — G459 Transient cerebral ischemic attack, unspecified: Secondary | ICD-10-CM | POA: Diagnosis not present

## 2021-06-24 DIAGNOSIS — Z79899 Other long term (current) drug therapy: Secondary | ICD-10-CM

## 2021-06-24 DIAGNOSIS — G8191 Hemiplegia, unspecified affecting right dominant side: Secondary | ICD-10-CM | POA: Diagnosis present

## 2021-06-24 DIAGNOSIS — I639 Cerebral infarction, unspecified: Secondary | ICD-10-CM | POA: Diagnosis not present

## 2021-06-24 DIAGNOSIS — E871 Hypo-osmolality and hyponatremia: Secondary | ICD-10-CM | POA: Diagnosis not present

## 2021-06-24 DIAGNOSIS — Z853 Personal history of malignant neoplasm of breast: Secondary | ICD-10-CM

## 2021-06-24 DIAGNOSIS — R7303 Prediabetes: Secondary | ICD-10-CM | POA: Diagnosis present

## 2021-06-24 DIAGNOSIS — I6381 Other cerebral infarction due to occlusion or stenosis of small artery: Principal | ICD-10-CM | POA: Diagnosis present

## 2021-06-24 DIAGNOSIS — M170 Bilateral primary osteoarthritis of knee: Secondary | ICD-10-CM | POA: Diagnosis present

## 2021-06-24 DIAGNOSIS — H409 Unspecified glaucoma: Secondary | ICD-10-CM | POA: Diagnosis present

## 2021-06-24 DIAGNOSIS — Z9013 Acquired absence of bilateral breasts and nipples: Secondary | ICD-10-CM

## 2021-06-24 DIAGNOSIS — Z7982 Long term (current) use of aspirin: Secondary | ICD-10-CM

## 2021-06-24 DIAGNOSIS — R0902 Hypoxemia: Secondary | ICD-10-CM | POA: Diagnosis not present

## 2021-06-24 DIAGNOSIS — E785 Hyperlipidemia, unspecified: Secondary | ICD-10-CM | POA: Diagnosis present

## 2021-06-24 DIAGNOSIS — Z20822 Contact with and (suspected) exposure to covid-19: Secondary | ICD-10-CM | POA: Diagnosis not present

## 2021-06-24 DIAGNOSIS — R4781 Slurred speech: Secondary | ICD-10-CM | POA: Diagnosis not present

## 2021-06-24 DIAGNOSIS — E782 Mixed hyperlipidemia: Secondary | ICD-10-CM

## 2021-06-24 DIAGNOSIS — R29810 Facial weakness: Secondary | ICD-10-CM | POA: Diagnosis not present

## 2021-06-24 DIAGNOSIS — R4701 Aphasia: Secondary | ICD-10-CM | POA: Diagnosis present

## 2021-06-24 DIAGNOSIS — Z8673 Personal history of transient ischemic attack (TIA), and cerebral infarction without residual deficits: Secondary | ICD-10-CM

## 2021-06-24 DIAGNOSIS — Z87891 Personal history of nicotine dependence: Secondary | ICD-10-CM

## 2021-06-24 DIAGNOSIS — R531 Weakness: Secondary | ICD-10-CM | POA: Diagnosis not present

## 2021-06-24 DIAGNOSIS — Z888 Allergy status to other drugs, medicaments and biological substances status: Secondary | ICD-10-CM

## 2021-06-24 DIAGNOSIS — Z91013 Allergy to seafood: Secondary | ICD-10-CM

## 2021-06-24 LAB — COMPREHENSIVE METABOLIC PANEL
ALT: 63 U/L — ABNORMAL HIGH (ref 0–44)
AST: 41 U/L (ref 15–41)
Albumin: 4.1 g/dL (ref 3.5–5.0)
Alkaline Phosphatase: 53 U/L (ref 38–126)
Anion gap: 11 (ref 5–15)
BUN: 12 mg/dL (ref 8–23)
CO2: 24 mmol/L (ref 22–32)
Calcium: 10.1 mg/dL (ref 8.9–10.3)
Chloride: 91 mmol/L — ABNORMAL LOW (ref 98–111)
Creatinine, Ser: 0.75 mg/dL (ref 0.44–1.00)
GFR, Estimated: >60 mL/min (ref >60)
Glucose, Bld: 138 mg/dL — ABNORMAL HIGH (ref 70–99)
Potassium: 3.5 mmol/L (ref 3.5–5.1)
Sodium: 126 mmol/L — ABNORMAL LOW (ref 135–145)
Total Bilirubin: 0.7 mg/dL (ref 0.3–1.2)
Total Protein: 7 g/dL (ref 6.5–8.1)

## 2021-06-24 LAB — APTT: aPTT: 23 seconds — ABNORMAL LOW (ref 24–36)

## 2021-06-24 LAB — I-STAT CHEM 8, ED
BUN: 14 mg/dL (ref 8–23)
Calcium, Ion: 1.19 mmol/L (ref 1.15–1.40)
Chloride: 91 mmol/L — ABNORMAL LOW (ref 98–111)
Creatinine, Ser: 0.6 mg/dL (ref 0.44–1.00)
Glucose, Bld: 134 mg/dL — ABNORMAL HIGH (ref 70–99)
HCT: 45 % (ref 36.0–46.0)
Hemoglobin: 15.3 g/dL — ABNORMAL HIGH (ref 12.0–15.0)
Potassium: 3.6 mmol/L (ref 3.5–5.1)
Sodium: 126 mmol/L — ABNORMAL LOW (ref 135–145)
TCO2: 26 mmol/L (ref 22–32)

## 2021-06-24 LAB — DIFFERENTIAL
Abs Immature Granulocytes: 0.07 10*3/uL (ref 0.00–0.07)
Basophils Absolute: 0.1 10*3/uL (ref 0.0–0.1)
Basophils Relative: 1 %
Eosinophils Absolute: 0.2 10*3/uL (ref 0.0–0.5)
Eosinophils Relative: 3 %
Immature Granulocytes: 1 %
Lymphocytes Relative: 24 %
Lymphs Abs: 2.4 10*3/uL (ref 0.7–4.0)
Monocytes Absolute: 0.9 10*3/uL (ref 0.1–1.0)
Monocytes Relative: 9 %
Neutro Abs: 6.1 10*3/uL (ref 1.7–7.7)
Neutrophils Relative %: 62 %

## 2021-06-24 LAB — CBC
HCT: 41.7 % (ref 36.0–46.0)
Hemoglobin: 14.2 g/dL (ref 12.0–15.0)
MCH: 30.6 pg (ref 26.0–34.0)
MCHC: 34.1 g/dL (ref 30.0–36.0)
MCV: 89.9 fL (ref 80.0–100.0)
Platelets: 385 10*3/uL (ref 150–400)
RBC: 4.64 MIL/uL (ref 3.87–5.11)
RDW: 13.2 % (ref 11.5–15.5)
WBC: 9.8 10*3/uL (ref 4.0–10.5)
nRBC: 0 % (ref 0.0–0.2)

## 2021-06-24 LAB — CBG MONITORING, ED: Glucose-Capillary: 139 mg/dL — ABNORMAL HIGH (ref 70–99)

## 2021-06-24 LAB — PROTIME-INR
INR: 0.9 (ref 0.8–1.2)
Prothrombin Time: 12 seconds (ref 11.4–15.2)

## 2021-06-24 IMAGING — CT CT HEAD CODE STROKE
3 series · 15 of 47 positions shown, 18 images · non-contrast
Comparison: Brain MRI [DATE]. CT angiogram head/neck
[DATE]. Head CT [DATE].

CLINICAL DATA: Code stroke. Neuro deficit, acute, stroke suspected.
Facial droop, slurred speech, right-sided weakness.

EXAM:
CT HEAD WITHOUT CONTRAST
TECHNIQUE: Contiguous axial images were obtained from the base of the skull
through the vertex without intravenous contrast.

[Series 2: head 5.0 st · axial · 0.45mm/px · z∈[-154,-9]mm · 9 of 35 slices shown, 12 images]
[im 3/35  brain]
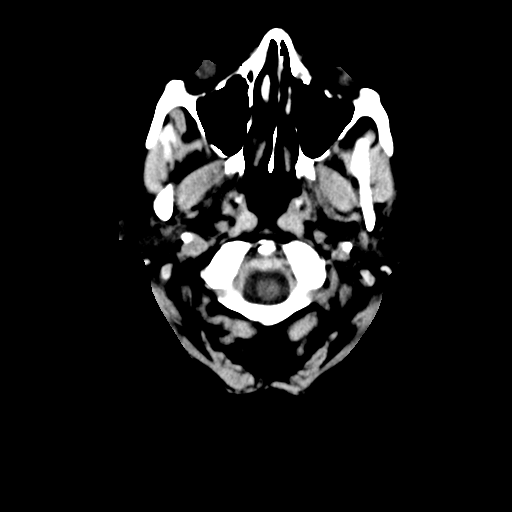
[im 3/35  bone]
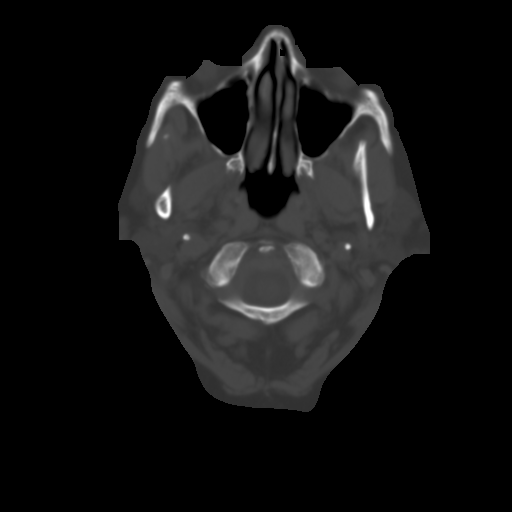
[im 6/35  brain]
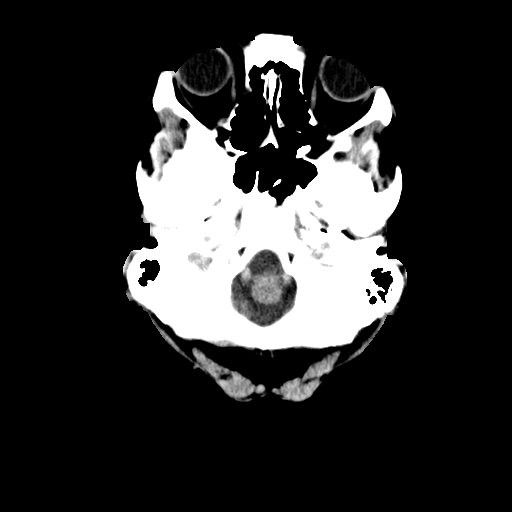
[im 10/35  brain]
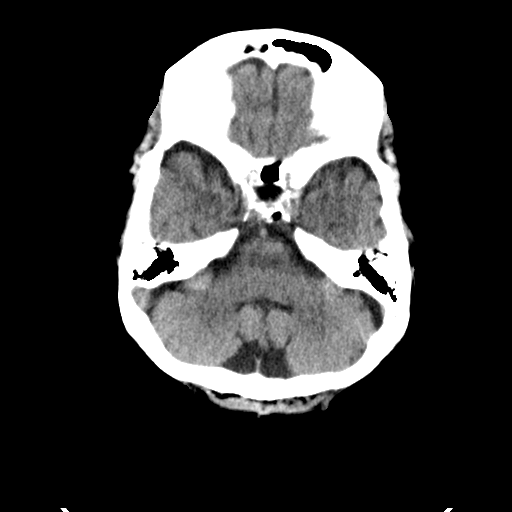
[im 13/35  brain]
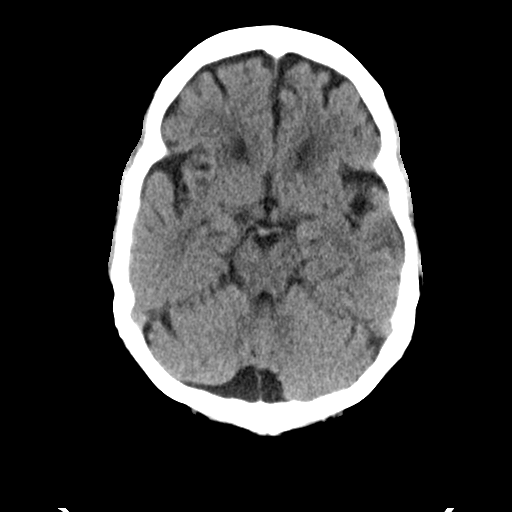
[im 18/35  brain]
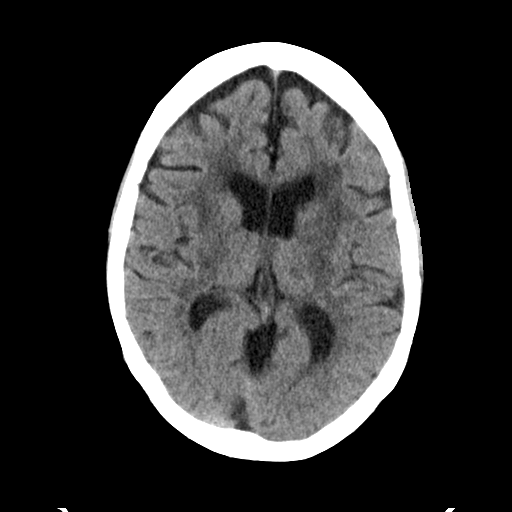
[im 18/35  bone]
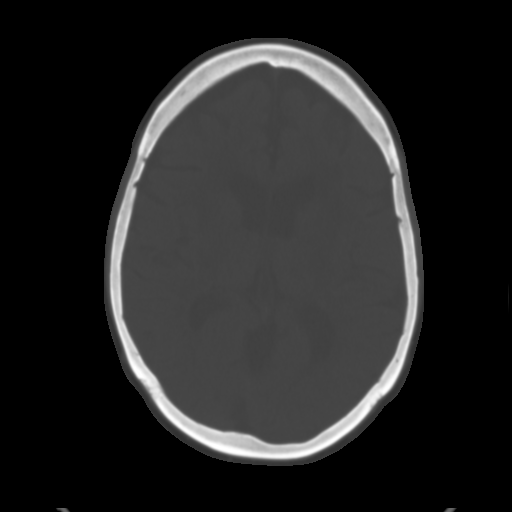
[im 22/35  brain]
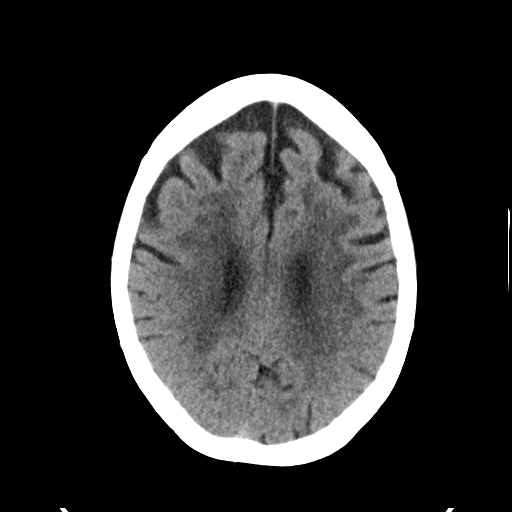
[im 25/35  brain]
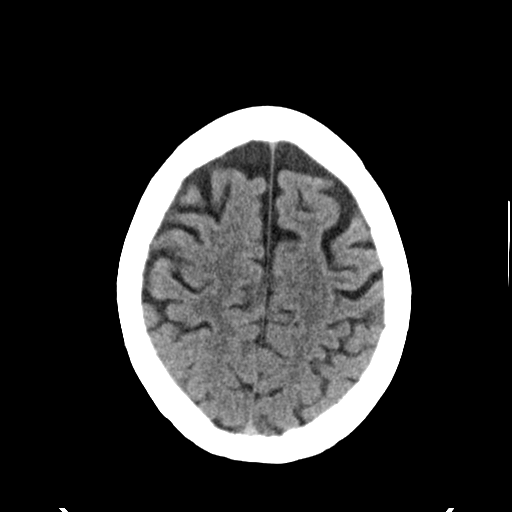
[im 29/35  brain]
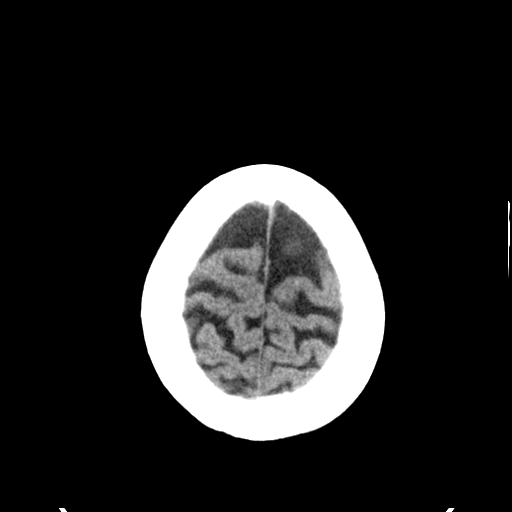
[im 32/35  brain]
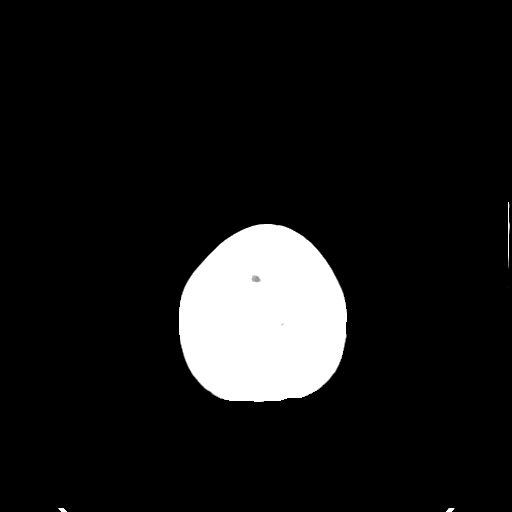
[im 32/35  bone]
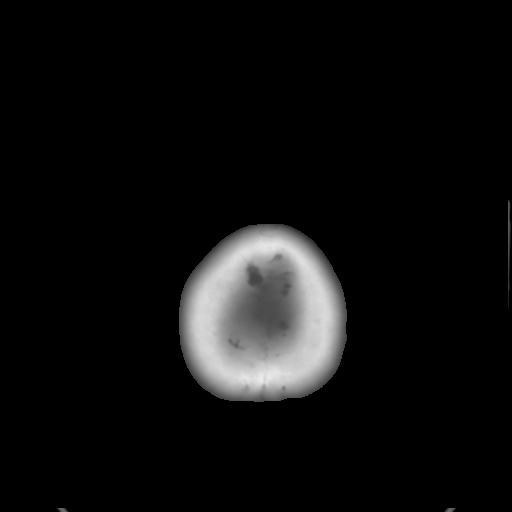

[Series 5: head 3.0 cor st · coronal · 0.31mm/px · 3 of 67 slices shown]
[im 23/67  brain]
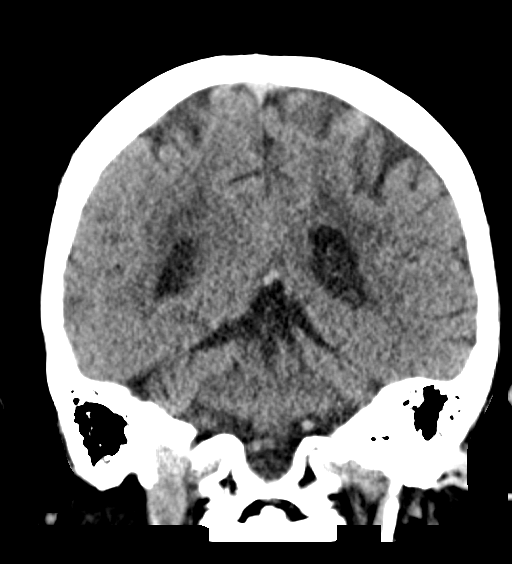
[im 30/67  brain]
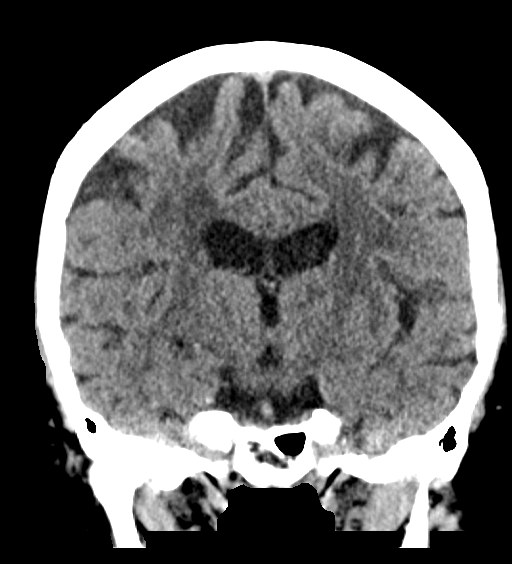
[im 37/67  brain]
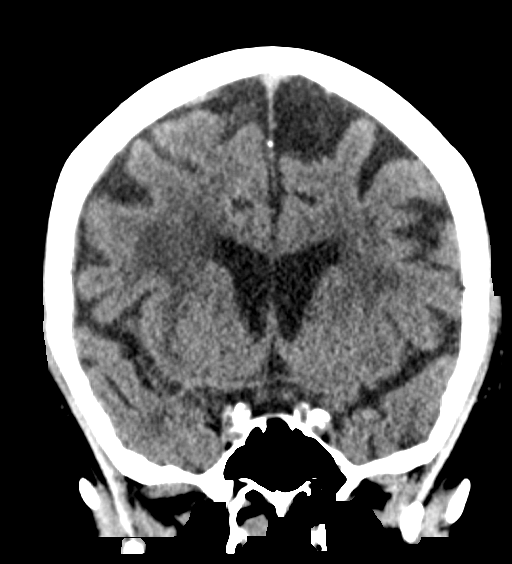

[Series 6: head 3.0 sag st · sagittal · 0.32mm/px · 3 of 52 slices shown]
[im 18/52  brain]
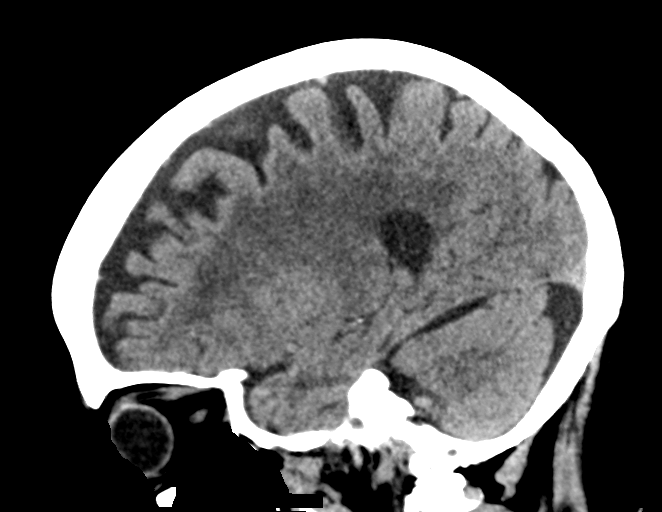
[im 26/52  brain]
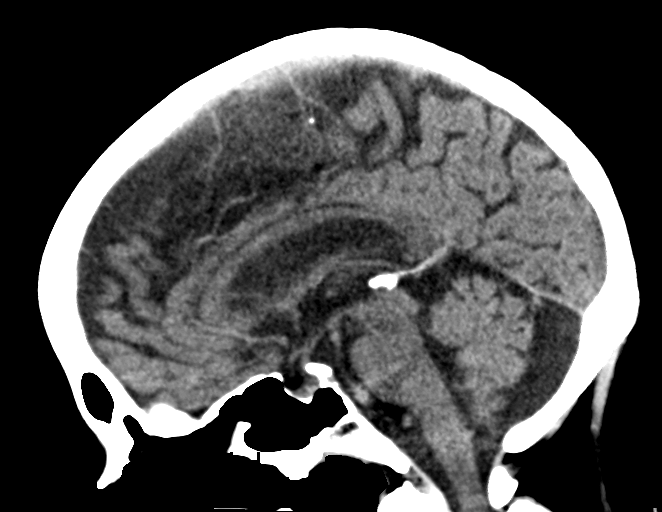
[im 35/52  brain]
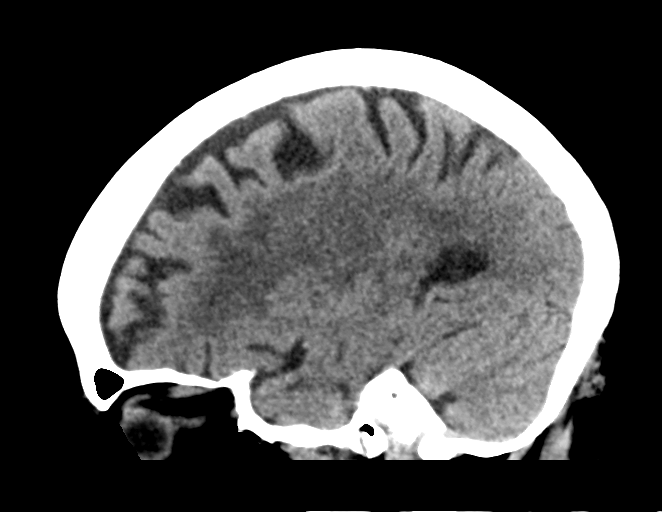

[15 of 47 positions shown; findings below may reference images not displayed]

FINDINGS: Brain:

Mild generalized cerebral atrophy.

Redemonstrated chronic lacunar infarcts within the bilateral basal
ganglia, thalami and right cerebellar hemisphere.

Background moderate/advanced patchy and ill-defined hypoattenuation
within the cerebral white matter, nonspecific but compatible chronic
small vessel ischemic disease.

There is no acute intracranial hemorrhage.

No demarcated cortical infarct.

No extra-axial fluid collection.

No evidence of an intracranial mass.

No midline shift.

Vascular: No hyperdense vessel.  Atherosclerotic calcifications.

Skull: Normal. Negative for fracture or focal lesion.

Sinuses/Orbits: Visualized orbits show no acute finding. Mild
mucosal thickening within the bilateral ethmoid air cells. Chronic
medially displaced fracture deformity of the right lamina papyracea.

ASPECTS ([place] Stroke Program Early CT Score)

- Ganglionic level infarction (caudate, lentiform nuclei, internal
capsule, insula, M1-M3 cortex): 7

- Supraganglionic infarction (M4-M6 cortex): 3

Total score (0-10 with 10 being normal): 10

These results were called by telephone at the time of interpretation
on [DATE] at [DATE] to provider [REDACTED], who verbally
acknowledged these results.
IMPRESSION: No evidence of acute intracranial abnormality.

Redemonstrated chronic lacunar infarcts within the bilateral deep
gray nuclei and right cerebellar hemisphere.

Moderate/advanced chronic small vessel ischemic changes within the
cerebral white matter.

Mild generalized cerebral atrophy.

## 2021-06-24 MED ORDER — SODIUM CHLORIDE 0.9% FLUSH
3.0000 mL | Freq: Once | INTRAVENOUS | Status: AC
Start: 2021-06-24 — End: 2021-06-25
  Administered 2021-06-25: 04:00:00 3 mL via INTRAVENOUS

## 2021-06-24 NOTE — ED Triage Notes (Addendum)
PT BIB [facility] as a Code Stroke from [employer] LKW 1730. Husband observed some confusion. R. Facial droop, R sided weakness, and slurred speech.  EMS stated pt was A&O on their arrival to residence but confirmed R sided weakness, facial droop and slurred speech.  Symptoms were improving upon arrival to ED.    EMS VS 164/78, 98% RA, HR 85, CBG 169.Marland Kitchen  EMS states that husband and pt endorse a similar incident yesterday which resolved within 15 min.

## 2021-06-24 NOTE — ED Notes (Signed)
Pt and family expressing concerns that they have not seen a doctor yet.  I spoke with Dr. Almyra Free who stated that he was waiting to hear from Neuro.  I checked in with [facility] who was with Neurologist for a code stroke in CT and they looked in computer and confirmed the neurologist had not had a chance to put in a note yet due to multiple recent stroke pt's. She was not aware of whether he had seen the pt in room yet or not.   This RN assured the pt and family that everyone was aware of their case and were reassured by the fact that her symptoms had resolved and remained resolved at this time.  I assured them that they would see a doctor asap.

## 2021-06-24 NOTE — ED Provider Notes (Signed)
[facility] EMERGENCY DEPARTMENT Provider Note   CSN: [id number] Arrival date & time: 06/24/21  1829  An emergency department physician performed an initial assessment on this suspected stroke patient at 10.  History Chief Complaint  Patient presents with   Code Stroke    Sandra Becker is a 80 y.o. female.  Patient presents ER chief complaint of slurred speech and right-sided weakness.  Symptoms occurred about an hour prior to arrival but have rapidly been improving.  Denies headache denies chest pain denies abdominal pain.  No prior history of stroke per patient.      Past Medical History:  Diagnosis Date   Breast cancer [facility] [facility] 1993   bilateral mastectomy; no chemo or radiation   Glaucoma    Hyperlipidemia    Hypertension     Patient Active Problem List   Diagnosis Date Noted   TIA (transient ischemic attack) 05/03/2021   Hyponatremia     Past Surgical History:  Procedure Laterality Date   MASTECTOMY Bilateral [place]     OB History   No obstetric history on file.     Family History  Problem Relation Age of Onset   Colon cancer Neg Hx     Social History   Tobacco Use   Smoking status: Former    Types: Cigarettes    Quit date: 07/30/1963    Years since quitting: 57.9   Smokeless tobacco: Never  Substance Use Topics   Alcohol use: Yes    Alcohol/week: 21.0 standard drinks    Types: 21 Glasses of wine per week   Drug use: No    Home Medications Prior to Admission medications   Medication Sig Start Date End Date Taking? Authorizing Provider  aspirin 81 MG EC tablet Take 1 tablet (81 mg total) by mouth daily. Swallow whole. 05/05/21   Charlynne Cousins, MD  brinzolamide (AZOPT) 1 % ophthalmic suspension Place 1 drop into both eyes 2 (two) times daily.     [provider]  chlorthalidone (HYGROTON) 25 MG tablet Take 25 mg by mouth daily. 04/03/21   [provider]  COVID-19 mRNA vaccine, Moderna, 100 MCG/0.5ML injection Inject into the muscle. 01/16/21   Carlyle Basques, MD  losartan (COZAAR) 50 MG tablet Take 50 mg by mouth daily.    [provider]  rosuvastatin (CRESTOR) 5 MG tablet Take 5 mg by mouth daily. 04/03/21   [provider]  timolol (TIMOPTIC) 0.5 % ophthalmic solution Place 1 drop into both eyes 2 (two) times daily. 02/12/21   [provider]  valsartan (DIOVAN) 320 MG tablet Take 320 mg by mouth daily. 04/20/21   [provider]    Allergies    Otho Darner allergy], Fosamax [alendronate sodium], and Ace inhibitors  Review of Systems   Review of Systems  Constitutional:  Negative for fever.  HENT:  Negative for ear pain.   Eyes:  Negative for pain.  Respiratory:  Negative for cough.   Cardiovascular:  Negative for chest pain.  Gastrointestinal:  Negative for abdominal pain.  Genitourinary:  Negative for flank pain.  Musculoskeletal:  Negative for back pain.  Skin:  Negative for rash.  Neurological:  Negative for headaches.   Physical Exam Updated Vital Signs BP (!) 157/73   Pulse 83   Temp 97.9 F (36.6 C) (Oral)   Resp 13   Ht 5\' 5"  (1.651 m)   Wt 73.6  kg   SpO2 93%   BMI 27.00 kg/m   Physical Exam Constitutional:      General: She is not in acute distress.    Appearance: Normal appearance.  HENT:     Head: Normocephalic.     Nose: Nose normal.  Eyes:     Extraocular Movements: Extraocular movements intact.  Cardiovascular:     Rate and Rhythm: Normal rate.  Pulmonary:     Effort: Pulmonary effort is normal.  Musculoskeletal:        General: Normal range of motion.     Cervical back: Normal range of motion.  Neurological:     General: No focal deficit present.     Mental Status: She is alert and oriented to person, place, and time. Mental status is at baseline.     Cranial Nerves: No cranial nerve deficit.     Motor: No weakness.    ED Results /  Procedures / Treatments   Labs (all labs ordered are listed, but only abnormal results are displayed) Labs Reviewed  APTT - Abnormal; Notable for the following components:      Result Value   aPTT 23 (*)    All other components within normal limits  COMPREHENSIVE METABOLIC PANEL - Abnormal; Notable for the following components:   Sodium 126 (*)    Chloride 91 (*)    Glucose, Bld 138 (*)    ALT 63 (*)    All other components within normal limits  I-STAT CHEM 8, ED - Abnormal; Notable for the following components:   Sodium 126 (*)    Chloride 91 (*)    Glucose, Bld 134 (*)    Hemoglobin 15.3 (*)    All other components within normal limits  CBG MONITORING, ED - Abnormal; Notable for the following components:   Glucose-Capillary 139 (*)    All other components within normal limits  PROTIME-INR  CBC  DIFFERENTIAL    EKG None  Radiology CT HEAD CODE STROKE WO CONTRAST  Result Date: 06/24/2021 CLINICAL DATA:  Code stroke. Neuro deficit, acute, stroke suspected. Facial droop, slurred speech, right-sided weakness. EXAM: CT HEAD WITHOUT CONTRAST TECHNIQUE: Contiguous axial images were obtained from the base of the skull through the vertex without intravenous contrast. COMPARISON:  Brain MRI 05/03/2021. CT angiogram head/neck 05/03/2021. Head CT 05/03/2021. FINDINGS: Brain: Mild generalized cerebral atrophy. 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 Background moderate/advanced patchy and ill-defined hypoattenuation within the cerebral white matter, nonspecific but compatible chronic small vessel ischemic disease. There is no acute intracranial hemorrhage. No demarcated cortical infarct. No extra-axial fluid collection. No evidence of an intracranial mass. No midline shift. Vascular: No hyperdense vessel.  Atherosclerotic calcifications. Skull: Normal. Negative for fracture or focal lesion. Sinuses/Orbits: Visualized  orbits show no acute finding. Mild mucosal thickening within the bilateral ethmoid air cells. Chronic medially displaced fracture deformity of the right lamina papyracea. ASPECTS ([place] Stroke Program Early CT Score) - Ganglionic level infarction (caudate, lentiform nuclei, internal capsule, insula, M1-M3 cortex): 7 - Supraganglionic infarction (M4-M6 cortex): 3 Total score (0-10 with 10 being normal): 10 These results were called by telephone at the time of interpretation on 06/24/2021 at 6:51 pm to provider Dr. Quinn Axe, who verbally acknowledged these results. IMPRESSION: No evidence of acute intracranial abnormality. Redemonstrated chronic lacunar infarcts within the bilateral deep gray nuclei and right cerebellar hemisphere. Moderate/advanced chronic small vessel ischemic changes within the cerebral white matter. Mild generalized cerebral atrophy. Electronically Signed  By: Kellie Simmering D.O.   On: 06/24/2021 18:53    Procedures .Critical Care Performed by: Luna Fuse, MD Authorized by: Luna Fuse, MD   Critical care provider statement:    Critical care time (minutes):  40   Critical care time was exclusive of:  Separately billable procedures and treating other patients and teaching time   Critical care was necessary to treat or prevent imminent or life-threatening deterioration of the following conditions:  CNS failure or compromise   Medications Ordered in ED Medications  sodium chloride flush (NS) 0.9 % injection 3 mL (has no administration in time range)    ED Course  I have reviewed the triage vital signs and the nursing notes.  Pertinent labs & imaging results that were available during my care of the patient were reviewed by me and considered in my medical decision making (see chart for details).    MDM Rules/Calculators/A&P                           Stroke alert was activated due to patient's symptoms.  However upon arrival symptoms have resolved and she has NIH score  of 0.  Stroke team evaluated the patient and his CT head was unremarkable other than evidence of prior ischemic events.  Patient be admitted to the hospitalist team for further work-up for TIA.  Final Clinical Impression(s) / ED Diagnoses Final diagnoses:  TIA (transient ischemic attack)    Rx / DC Orders ED Discharge Orders     None        Luna Fuse, MD 06/24/21 2221

## 2021-06-24 NOTE — ED Notes (Signed)
Neurology has seen pt.  Pt and family are satisfied and husband is going home for the night.  Admitting will come see pt.

## 2021-06-24 NOTE — ED Notes (Addendum)
I have discussed pt further with Dr. Almyra Free who mentioned that he had been in to see the pt and encouraged them that their results were reassuring and neurology would be in to see them asap.

## 2021-06-24 NOTE — Code Documentation (Signed)
Stroke Response Nurse Documentation Code Documentation  Sandra Becker is a 80 y.o. female arriving to [facility] ED via [facility] on 06/24/21 with past medical hx of HTN, glaucoma, breast cancer. On No antithrombotic. Code stroke was activated by EMS (EMS, ED).   Patient from home where she was LKW at 1730 and now complaining of right sided facial droop and right sided weakness.   Stroke team at the bedside on patient arrival. Labs drawn and patient cleared for CT by Dr. Almyra Free. Patient to CT with team. NIHSS 0, see documentation for details and code stroke times. The following imaging was completed:  CT (CT, CTA head and neck, CTP). Patient is not a candidate for IV Thrombolytic. Patient is not a candidate for IR because of NIH 0  Care/Plan: q2h neuro checks x 12 hours.   Bedside handoff with ED RN Josh.    [place]  Stroke Response RN

## 2021-06-24 NOTE — Consult Note (Signed)
NEUROLOGY CONSULTATION NOTE   Date of service: June 24, 2021 Patient Name: Sandra Becker MRN:  [id number] DOB:  1941-05-21 Reason for consult: "R sided weakness" Requesting Provider: Chotiner, Yevonne Aline, MD _ _ _   _ __   _ __ _ _  __ __   _ __   __ _  History of Present Illness  Sandra Becker is a 80 y.o. female with PMH significant for hyperlipidemia, hypertension, recent TIA, history of glaucoma, remote history of breast cancer who presents with right upper extremity weakness and difficulty word finding and slurring of her speech.  Husband reports the patient has history of back pain and arthritis in her legs and so it is not unusual for her to have some difficulty walking at baseline.  He reports that 8 1800 on 06/24/2021, patient was sitting in the couch and she just looked off.  She seemed to be awake and alert but was having trouble with words and she could not pick up her right arm.  Husband called EMS and symptoms resolved in route.  Symptoms lasted about 40 minutes.  They report that patient was recently admitted in October for similar presentation.  She had extensive work-up at that time and the etiology of her TIA was cryptogenic.  She also had a routine EEG which did not demonstrate any epileptiform discharges.  Sandra Becker: 1800 on 06/24/2021. TNKase: Not offered due to resolution of symptoms.  Thrombectomy: Not offered due to no LVO. MR S: 0.  Lives in a independent living facility the patient is able to do all ADLs by herself.    ROS   Constitutional Denies weight loss, fever and chills.   HEENT Denies changes in vision and hearing.   Respiratory Denies SOB and cough.   CV Denies palpitations and CP   GI Denies abdominal pain, nausea, vomiting and diarrhea.   GU Denies dysuria and urinary frequency.   MSK Denies myalgia and joint pain.   Skin Denies rash and pruritus.   Neurological Denies headache and syncope.   Psychiatric Denies recent changes in mood. Denies  anxiety and depression.    Past History   Past Medical History:  Diagnosis Date   Breast cancer ([place]) 1993   bilateral mastectomy; no chemo or radiation   Glaucoma    Hyperlipidemia    Hypertension    Past Surgical History:  Procedure Laterality Date   MASTECTOMY Bilateral 1993   TONSILLECTOMY AND ADENOIDECTOMY  1947   TUBAL LIGATION  1974   Family History  Problem Relation Age of Onset   Colon cancer Neg Hx    Social History   Socioeconomic History   Marital status: Married    Spouse name: Not on file   Number of children: Not on file   Years of education: Not on file   Highest education level: Not on file  Occupational History   Not on file  Tobacco Use   Smoking status: Former    Types: Cigarettes    Quit date: 07/30/1963    Years since quitting: 57.9   Smokeless tobacco: Never  Substance and Sexual Activity   Alcohol use: Yes    Alcohol/week: 21.0 standard drinks    Types: 21 Glasses of wine per week   Drug use: No   Sexual activity: Not on file  Other Topics Concern   Not on file  Social History Narrative   Not on file   Social Determinants of Health   Financial Resource Strain:  Not on file  Food Insecurity: Not on file  Transportation Needs: Not on file  Physical Activity: Not on file  Stress: Not on file  Social Connections: Not on file   Allergies  Allergen Reactions   Crab [Shellfish Allergy] Other (See Comments)    From skin test   Fosamax [Alendronate Sodium] Other (See Comments)    Difficult swallowing   Ace Inhibitors Cough    Medications  (Not in a hospital admission)    Vitals   Vitals:   06/24/21 2130 06/24/21 2145 06/24/21 2200 06/24/21 2215  BP: (!) 158/75 (!) 157/73 (!) 156/72 (!) 145/68  Pulse: 91 83 90 85  Resp: 17 13 15 11   Temp:      TempSrc:      SpO2: 97% 93% 96% 98%  Weight:      Height:         Body mass index is 27 kg/m.  Physical Exam   General: Laying comfortably in bed; in no acute distress.  HENT:  Normal oropharynx and mucosa. Normal external appearance of ears and nose.  Neck: Supple, no pain or tenderness  CV: No JVD. No peripheral edema.  Pulmonary: Symmetric Chest rise. Normal respiratory effort.  Abdomen: Soft to touch, non-tender.  Ext: No cyanosis, edema, or deformity  Skin: No rash. Normal palpation of skin.   Musculoskeletal: Normal digits and nails by inspection. No clubbing.   Neurologic Examination  Mental status/Cognition: Alert, oriented to self, place, month and year, good attention.  Speech/language: Fluent, comprehension intact, object naming intact, repetition intact.  Cranial nerves:   CN II Pupils equal and reactive to light, no VF deficits    CN III,IV,VI EOM intact, no gaze preference or deviation, no nystagmus    CN V normal sensation in V1, V2, and V3 segments bilaterally    CN VII no asymmetry, no nasolabial fold flattening    CN VIII normal hearing to speech    CN IX & X normal palatal elevation, no uvular deviation    CN XI 5/5 head turn and 5/5 shoulder shrug bilaterally    CN XII midline tongue protrusion    Motor:  Muscle bulk: normal, tone normal, pronator drift none tremor none Mvmt Root Nerve  Muscle Right Left Comments  SA C5/6 Ax Deltoid     EF C5/6 Mc Biceps     EE C6/7/8 Rad Triceps     WF C6/7 Med FCR     WE C7/8 PIN ECU     F Ab C8/T1 U ADM/FDI     HF L1/2/3 Fem Illopsoas     KE L2/3/4 Fem Quad     DF L4/5 D Peron Tib Ant     PF S1/2 Tibial Grc/Sol      Reflexes:  Right Left Comments  Pectoralis      Biceps (C5/6)     Brachioradialis (C5/6)      Triceps (C6/7)      Patellar (L3/4)      Achilles (S1)      Hoffman      Plantar     Jaw jerk    Sensation:  Light touch    Pin prick    Temperature    Vibration   Proprioception    Coordination/Complex Motor:  - Finger to Nose intact BL - Heel to shin intact bL - Rapid alternating movement are normal - Gait: Deferred.  Labs   CBC:  Recent Labs  Lab 06/24/21 1837  06/24/21 1841  WBC  9.8  --   NEUTROABS 6.1  --   HGB 14.2 15.3*  HCT 41.7 45.0  MCV 89.9  --   PLT 385  --     Basic Metabolic Panel:  Lab Results  Component Value Date   NA 126 (L) 06/24/2021   K 3.6 06/24/2021   CO2 24 06/24/2021   GLUCOSE 134 (H) 06/24/2021   BUN 14 06/24/2021   CREATININE 0.60 06/24/2021   CALCIUM 10.1 06/24/2021   GFRNONAA >60 06/24/2021   GFRAA >60 09/25/2018   Lipid Panel:  Lab Results  Component Value Date   LDLCALC 69 05/04/2021   HgbA1c:  Lab Results  Component Value Date   HGBA1C 6.0 (H) 05/04/2021   Urine Drug Screen:     Component Value Date/Time   LABOPIA NONE DETECTED 05/03/2021 [place] DETECTED 05/03/2021 1423   LABBENZ NONE DETECTED 05/03/2021 1423   AMPHETMU NONE DETECTED 05/03/2021 [place] DETECTED 05/03/2021 1423   LABBARB NONE DETECTED 05/03/2021 1423    Alcohol Level     Component Value Date/Time   ETH <10 05/03/2021 1234    CT Head without contrast(Personally reviewed): CTH was negative for a large hypodensity concerning for a large territory infarct or hyperdensity concerning for an ICH  MR Angio head without contrast and Carotid Duplex BL: pending  MRI Brain: pending  rEEG:  pending  Impression   Sandra Becker is a 80 y.o. female with PMH significant for hyperlipidemia, hypertension, recent TIA, history of glaucoma, remote history of breast cancer who presents with right upper extremity weakness and difficulty word finding and slurring of her speech.  Her symptoms spontaneously resolved after about 40 minutes.  She had a very similar episode in October 2022 and had extensive work-up for TIA along with the routine EEG which did not demonstrate any seizures or epileptiform discharges.  She was discharged in the hospital with a diagnosis of TIA.  The fact that she had a recurrent episode that is pretty much identical to her prior episode, makes me think of potential seizures.  However,  symptoms could also potentially localize to left MCA distribution which is the most common territory for potential embolic strokes.  The duration of her symptoms fits a TIA more so than seizure. I would recommend admission for TIA/stroke work-up and will get a routine EEG this time again.  Recommendations   Plan:  Recommend that primary team order following: - Frequent Neuro checks per stroke unit protocol - Recommend brain imaging with MRI Brain without contrast - Recommend Vascular imaging with MRA Angio Head without contrast and US Carotid doppler - No need to repeat TTE, she recently had it completed. - Recommend obtaining Lipid panel with LDL - Please start statin if LDL > 70 - Recommend HbA1c - Antithrombotic - Aspirin 81mg  daily. - Recommend DVT ppx - SBP goal - permissive hypertension first 24 h < 220/110. Held home meds.  - Recommend Telemetry monitoring for arrythmia - Recommend bedside swallow screen prior to PO intake. - Stroke education booklet - Recommend PT/OT/SLP consult - recommend routine EEG.  ______________________________________________________________________  Plan discussed with Dr. Almyra Free with the ED team in person.  Thank you for the opportunity to take part in the care of this patient. If you have any further questions, please contact the neurology consultation attending.  Signed,  [place] Pager Number [phone] _ _ _   _ __   _ __ _ _  __  __   _ __   __ _  

## 2021-06-24 NOTE — H&P (Addendum)
History and Physical    Sandra Becker UXN:[id number] DOB: Oct 21, 1940 DOA: 06/24/2021  PCP: Kelton Pillar, MD   Patient coming from: Home  Chief Complaint: Right sided weakness with slurred speech  HPI: Sandra Becker is a 80 y.o. female with medical history significant for HTN, glaucoma, TIA, HLD, breast cancer s/p bilateral mastectomy presents for dilation of sudden onset of slurred speech with right-sided weakness.  She reports that she was sitting at home with her husband when she developed slurred speech that was difficult for her to get the words out and she had some right-sided weakness.  Husband called 8.  Symptoms resolved while in the ambulance about 30 minutes after they started.  She denies having any headache, chest pain, palpitations, nausea vomiting diarrhea or abdominal pain.  She was not doing any type of exertion when the symptoms began.  She has not had any change in her diet.  He denies any swelling of her legs.  She been taking her medications as prescribed.  Symptoms are completely resolved at this time.  She denies any visual change with the symptoms. Lives with her husband.  No tobacco, alcohol, illicit drug use  ED Course: Ms. Kozakiewicz has been hemodynamically stable in the emergency room.  Speech is back to baseline.  She is able to move all extremities and has no numbness in extremities.  CT of the head was negative for acute pathology.  Sodium 126 which is her baseline level.  Potassium 3.6 chloride 91 bicarb 24 creatinine 0.75 BUN 12 glucose 138 AST 41 ALT 63 alkaline phosphatase 53.  CBC unremarkable.  INR 0.9.   Review of Systems:  General: Denies fever, chills, weight loss, night sweats.  Denies dizziness.  Denies change in appetite HENT: Denies head trauma, headache, denies change in hearing, tinnitus.  Denies nasal congestion or bleeding.  Denies sore throat.  Denies difficulty swallowing Eyes: Denies blurry vision, pain in eye, drainage.  Denies  discoloration of eyes. Neck: Denies pain.  Denies swelling.  Denies pain with movement. Cardiovascular: Denies chest pain, palpitations.  Denies edema.  Denies orthopnea Respiratory: Denies shortness of breath, cough.  Denies wheezing.  Denies sputum production Gastrointestinal: Denies abdominal pain, swelling.  Denies nausea, vomiting, diarrhea.  Denies melena.  Denies hematemesis. Musculoskeletal: Denies limitation of movement.  Denies deformity or swelling. Denies arthralgias or myalgias. Genitourinary: Denies pelvic pain.  Denies urinary frequency or hesitancy.  Denies dysuria.  Skin: Denies rash.  Denies petechiae, purpura, ecchymosis. Neurological: Denies syncope.  Denies seizure activity. Denies paresthesia.  Denies drooping face.  Denies visual change. Psychiatric: Denies depression, anxiety.  Denies hallucinations.  Past Medical History:  Diagnosis Date   Breast cancer ([place]) 1993   bilateral mastectomy; no chemo or radiation   Glaucoma    Hyperlipidemia    Hypertension     Past Surgical History:  Procedure Laterality Date   MASTECTOMY Bilateral [place]    Social History  reports that she quit smoking about 57 years ago. She has never used smokeless tobacco. She reports current alcohol use of about 21.0 standard drinks per week. She reports that she does not use drugs.  Allergies  Allergen Reactions   Crab [Shellfish Allergy] Other (See Comments)    From skin test   Fosamax [Alendronate Sodium] Other (See Comments)    Difficult swallowing   Ace Inhibitors Cough    Family History  Problem Relation Age of Onset  Colon cancer Neg Hx      Prior to Admission medications   Medication Sig Start Date End Date Taking? Authorizing Provider  aspirin 81 MG EC tablet Take 1 tablet (81 mg total) by mouth daily. Swallow whole. 05/05/21   Charlynne Cousins, MD  brinzolamide (AZOPT) 1 % ophthalmic suspension Place 1  drop into both eyes 2 (two) times daily.     [provider]  chlorthalidone (HYGROTON) 25 MG tablet Take 25 mg by mouth daily. 04/03/21   [provider]  COVID-19 mRNA vaccine, Moderna, 100 MCG/0.5ML injection Inject into the muscle. 01/16/21   Carlyle Basques, MD  losartan (COZAAR) 50 MG tablet Take 50 mg by mouth daily.    [provider]  rosuvastatin (CRESTOR) 5 MG tablet Take 5 mg by mouth daily. 04/03/21   [provider]  timolol (TIMOPTIC) 0.5 % ophthalmic solution Place 1 drop into both eyes 2 (two) times daily. 02/12/21   [provider]  valsartan (DIOVAN) 320 MG tablet Take 320 mg by mouth daily. 04/20/21   [provider]    Physical Exam: Vitals:   06/24/21 2130 06/24/21 2145 06/24/21 2200 06/24/21 2215  BP: (!) 158/75 (!) 157/73 (!) 156/72 (!) 145/68  Pulse: 91 83 90 85  Resp: 17 13 15 11   Temp:      TempSrc:      SpO2: 97% 93% 96% 98%  Weight:      Height:        Constitutional: NAD, calm, comfortable Vitals:   06/24/21 2130 06/24/21 2145 06/24/21 2200 06/24/21 2215  BP: (!) 158/75 (!) 157/73 (!) 156/72 (!) 145/68  Pulse: 91 83 90 85  Resp: 17 13 15 11   Temp:      TempSrc:      SpO2: 97% 93% 96% 98%  Weight:      Height:       General: WDWN, Alert and oriented x3.  Eyes: PERRL, lids and conjunctivae normal. No nystagmus ENMT: Mucous membranes are moist. Posterior pharynx clear of any exudate or lesions. Normal dentition.  Neck: normal, FROM. supple, no masses, no thyromegaly Respiratory: clear to auscultation bilaterally, no wheezing, no crackles. Normal respiratory effort. No accessory muscle use.  Cardiovascular: Regular rate and rhythm, no murmurs / rubs / gallops. No extremity edema. 2+ pedal pulses. No carotid bruits.  Abdomen: Soft. no tenderness, nondistended. no masses palpated. No hepatosplenomegaly. Bowel sounds positive.  Musculoskeletal:  FROM, No cyanosis. No joint deformity upper and lower  extremities. Normal muscle tone.  Skin: no rashes, lesions, ulcers. No induration Neurologic: CN 2-12 grossly intact. Sensation intact, DTR normal. Strength 5/5 in all 4. No tremor. No pronator drift. Psychiatric: Normal judgment and insight. Normal mood.    Labs on Admission: I have personally reviewed following labs and imaging studies  CBC: Recent Labs  Lab 06/24/21 1837 06/24/21 1841  WBC 9.8  --   NEUTROABS 6.1  --   HGB 14.2 15.3*  HCT 41.7 45.0  MCV 89.9  --   PLT 385  --     Basic Metabolic Panel: Recent Labs  Lab 06/24/21 1837 06/24/21 1841  NA 126* 126*  K 3.5 3.6  CL 91* 91*  CO2 24  --   GLUCOSE 138* 134*  BUN 12 14  CREATININE 0.75 0.60  CALCIUM 10.1  --     GFR: Estimated Creatinine Clearance: 56.3 mL/min (by C-G formula based on SCr of 0.6 mg/dL).  Liver Function Tests: Recent Labs  Lab  06/24/21 1837  AST 41  ALT 63*  ALKPHOS 53  BILITOT 0.7  PROT 7.0  ALBUMIN 4.1    Urine analysis:    Component Value Date/Time   COLORURINE STRAW (A) 05/03/2021 1423   APPEARANCEUR CLEAR 05/03/2021 1423   LABSPEC 1.016 05/03/2021 1423   PHURINE 8.0 05/03/2021 1423   GLUCOSEU NEGATIVE 05/03/2021 1423   HGBUR NEGATIVE 05/03/2021 [place] 05/03/2021 1423   KETONESUR NEGATIVE 05/03/2021 1423   PROTEINUR NEGATIVE 05/03/2021 1423   UROBILINOGEN 0.2 04/27/2011 2131   NITRITE NEGATIVE 05/03/2021 1423   LEUKOCYTESUR NEGATIVE 05/03/2021 1423    Radiological Exams on Admission: CT HEAD CODE STROKE WO CONTRAST  Result Date: 06/24/2021 CLINICAL DATA:  Code stroke. Neuro deficit, acute, stroke suspected. Facial droop, slurred speech, right-sided weakness. EXAM: CT HEAD WITHOUT CONTRAST TECHNIQUE: Contiguous axial images were obtained from the base of the skull through the vertex without intravenous contrast. COMPARISON:  Brain MRI 05/03/2021. CT angiogram head/neck 05/03/2021. Head CT 05/03/2021. FINDINGS: Brain: Mild generalized cerebral  atrophy. 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 Background moderate/advanced patchy and ill-defined hypoattenuation within the cerebral white matter, nonspecific but compatible chronic small vessel ischemic disease. There is no acute intracranial hemorrhage. No demarcated cortical infarct. No extra-axial fluid collection. No evidence of an intracranial mass. No midline shift. Vascular: No hyperdense vessel.  Atherosclerotic calcifications. Skull: Normal. Negative for fracture or focal lesion. Sinuses/Orbits: Visualized orbits show no acute finding. Mild mucosal thickening within the bilateral ethmoid air cells. Chronic medially displaced fracture deformity of the right lamina papyracea. ASPECTS ([place] Stroke Program Early CT Score) - Ganglionic level infarction (caudate, lentiform nuclei, internal capsule, insula, M1-M3 cortex): 7 - Supraganglionic infarction (M4-M6 cortex): 3 Total score (0-10 with 10 being normal): 10 These results were called by telephone at the time of interpretation on 06/24/2021 at 6:51 pm to provider Dr. Quinn Axe, who verbally acknowledged these results. IMPRESSION: No evidence of acute intracranial abnormality. Redemonstrated chronic lacunar infarcts within the bilateral deep gray nuclei and right cerebellar hemisphere. Moderate/advanced chronic small vessel ischemic changes within the cerebral white matter. Mild generalized cerebral atrophy. Electronically Signed   By: Kellie Simmering D.O.   On: 06/24/2021 18:53    EKG: Independently reviewed.  EKG shows normal sinus rhythm without ST elevation or depression.  QTc 434  Assessment/Plan Principal Problem:   TIA (transient ischemic attack) Patient placed on medical telemetry for TIA.  Obtain MRI brain MRA head and carotid doppler u/s ordered and pending Had echocardiogram in October of this year that showed an EF of 60 to 65% with mild diastolic dysfunction so  we will not repeat a cardiogram at this time Hypertension of 220/110 will be allowed for 24 hours per stroke protocol.  After which blood pressure will be slowly reduced to goal level. Antiplatelet therapy with aspirin daily. Continue statin therapy.  Check lipid panel.  Patient had a hemoglobin A1c of 6.0 last month so would not be repeated today Neurochecks per stroke protocol  Active Problems:   Essential hypertension Home medications will be resumed tomorrow. Monitor BP    Hyponatremia Chronic. IVF with NS overnight.  Recheck electrolytes, renal function in am  DVT prophylaxis: SCDs for DVT prophylaxis  Code Status:   Full Code  Family Communication:  Diagnosis and plan discussed with patient.  She verbalized understanding and agrees with plan.  Further recommendations to follow as clinically indicated Disposition Plan:   Patient is from:  Home  Anticipated DC to:  Home  Anticipated DC date:  Anticipate less than two midnight stay  Admission status:  Observation  Yevonne Aline Marquesha Robideau MD [facility]  How to contact the [facility] Attending or Consulting provider [place] or covering provider during after hours [place], for this patient?   Check the care team in [facility] and look for a) attending/consulting [facility] provider listed and b) the [facility] team listed Log into [email or web address] and use [place]'s universal password to access. If you do not have the password, please contact the hospital operator. Locate the [facility] provider you are looking for under [facility] and page to a number that you can be directly reached. If you still have difficulty reaching the provider, please page the [facility] (Director on Call) for the Hospitalists listed on amion for assistance.  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  Discussed with Neurology and will keep same plan and management at this time

## 2021-06-25 ENCOUNTER — Observation Stay (HOSPITAL_COMMUNITY): Payer: Medicare Other

## 2021-06-25 DIAGNOSIS — I639 Cerebral infarction, unspecified: Secondary | ICD-10-CM

## 2021-06-25 DIAGNOSIS — G459 Transient cerebral ischemic attack, unspecified: Secondary | ICD-10-CM | POA: Diagnosis not present

## 2021-06-25 DIAGNOSIS — R569 Unspecified convulsions: Secondary | ICD-10-CM | POA: Diagnosis not present

## 2021-06-25 DIAGNOSIS — Z8673 Personal history of transient ischemic attack (TIA), and cerebral infarction without residual deficits: Secondary | ICD-10-CM | POA: Diagnosis not present

## 2021-06-25 DIAGNOSIS — E7800 Pure hypercholesterolemia, unspecified: Secondary | ICD-10-CM | POA: Diagnosis not present

## 2021-06-25 DIAGNOSIS — E871 Hypo-osmolality and hyponatremia: Secondary | ICD-10-CM | POA: Diagnosis not present

## 2021-06-25 DIAGNOSIS — I63532 Cerebral infarction due to unspecified occlusion or stenosis of left posterior cerebral artery: Secondary | ICD-10-CM | POA: Diagnosis not present

## 2021-06-25 DIAGNOSIS — I10 Essential (primary) hypertension: Secondary | ICD-10-CM | POA: Diagnosis not present

## 2021-06-25 LAB — COMPREHENSIVE METABOLIC PANEL
ALT: 58 U/L — ABNORMAL HIGH (ref 0–44)
ALT: 62 U/L — ABNORMAL HIGH (ref 0–44)
AST: 38 U/L (ref 15–41)
AST: 39 U/L (ref 15–41)
Albumin: 3.9 g/dL (ref 3.5–5.0)
Albumin: 4.2 g/dL (ref 3.5–5.0)
Alkaline Phosphatase: 50 U/L (ref 38–126)
Alkaline Phosphatase: 52 U/L (ref 38–126)
Anion gap: 11 (ref 5–15)
Anion gap: 14 (ref 5–15)
BUN: 11 mg/dL (ref 8–23)
BUN: 12 mg/dL (ref 8–23)
CO2: 24 mmol/L (ref 22–32)
CO2: 23 mmol/L (ref 22–32)
Calcium: 9.5 mg/dL (ref 8.9–10.3)
Calcium: 9.6 mg/dL (ref 8.9–10.3)
Chloride: 92 mmol/L — ABNORMAL LOW (ref 98–111)
Chloride: 93 mmol/L — ABNORMAL LOW (ref 98–111)
Creatinine, Ser: 0.52 mg/dL (ref 0.44–1.00)
Creatinine, Ser: 0.62 mg/dL (ref 0.44–1.00)
GFR, Estimated: >60 mL/min (ref >60)
GFR, Estimated: >60 mL/min (ref >60)
Glucose, Bld: 115 mg/dL — ABNORMAL HIGH (ref 70–99)
Glucose, Bld: 114 mg/dL — ABNORMAL HIGH (ref 70–99)
Potassium: 3.8 mmol/L (ref 3.5–5.1)
Potassium: 4.2 mmol/L (ref 3.5–5.1)
Sodium: 127 mmol/L — ABNORMAL LOW (ref 135–145)
Sodium: 130 mmol/L — ABNORMAL LOW (ref 135–145)
Total Bilirubin: 0.7 mg/dL (ref 0.3–1.2)
Total Bilirubin: 0.8 mg/dL (ref 0.3–1.2)
Total Protein: 6.6 g/dL (ref 6.5–8.1)
Total Protein: 6.8 g/dL (ref 6.5–8.1)

## 2021-06-25 LAB — CBC WITH DIFFERENTIAL/PLATELET
Abs Immature Granulocytes: 0.07 10*3/uL (ref 0.00–0.07)
Abs Immature Granulocytes: 0.03 10*3/uL (ref 0.00–0.07)
Basophils Absolute: 0.1 10*3/uL (ref 0.0–0.1)
Basophils Absolute: 0.1 10*3/uL (ref 0.0–0.1)
Basophils Relative: 1 %
Basophils Relative: 1 %
Eosinophils Absolute: 0.1 10*3/uL (ref 0.0–0.5)
Eosinophils Absolute: 0.1 10*3/uL (ref 0.0–0.5)
Eosinophils Relative: 1 %
Eosinophils Relative: 1 %
HCT: 43.5 % (ref 36.0–46.0)
HCT: 40.9 % (ref 36.0–46.0)
Hemoglobin: 14.4 g/dL (ref 12.0–15.0)
Hemoglobin: 14 g/dL (ref 12.0–15.0)
Immature Granulocytes: 1 %
Immature Granulocytes: 0 %
Lymphocytes Relative: 24 %
Lymphocytes Relative: 28 %
Lymphs Abs: 2.2 10*3/uL (ref 0.7–4.0)
Lymphs Abs: 2.6 10*3/uL (ref 0.7–4.0)
MCH: 30 pg (ref 26.0–34.0)
MCH: 30.5 pg (ref 26.0–34.0)
MCHC: 33.1 g/dL (ref 30.0–36.0)
MCHC: 34.2 g/dL (ref 30.0–36.0)
MCV: 90.6 fL (ref 80.0–100.0)
MCV: 89.1 fL (ref 80.0–100.0)
Monocytes Absolute: 1.1 10*3/uL — ABNORMAL HIGH (ref 0.1–1.0)
Monocytes Absolute: 1 10*3/uL (ref 0.1–1.0)
Monocytes Relative: 12 %
Monocytes Relative: 11 %
Neutro Abs: 5.5 10*3/uL (ref 1.7–7.7)
Neutro Abs: 5.5 10*3/uL (ref 1.7–7.7)
Neutrophils Relative %: 61 %
Neutrophils Relative %: 59 %
Platelets: 367 10*3/uL (ref 150–400)
Platelets: 405 10*3/uL — ABNORMAL HIGH (ref 150–400)
RBC: 4.8 MIL/uL (ref 3.87–5.11)
RBC: 4.59 MIL/uL (ref 3.87–5.11)
RDW: 13.2 % (ref 11.5–15.5)
RDW: 13.4 % (ref 11.5–15.5)
WBC: 9.1 10*3/uL (ref 4.0–10.5)
WBC: 9.3 10*3/uL (ref 4.0–10.5)
nRBC: 0 % (ref 0.0–0.2)
nRBC: 0 % (ref 0.0–0.2)

## 2021-06-25 LAB — RESP PANEL BY RT-PCR (FLU A&B, COVID) ARPGX2
Influenza A by PCR: NEGATIVE
Influenza B by PCR: NEGATIVE
SARS Coronavirus 2 by RT PCR: NEGATIVE

## 2021-06-25 LAB — LIPID PANEL
Cholesterol: 194 mg/dL (ref 0–200)
Cholesterol: 183 mg/dL (ref 0–200)
HDL: 88 mg/dL (ref >40)
HDL: 87 mg/dL (ref >40)
LDL Cholesterol: 91 mg/dL (ref 0–99)
LDL Cholesterol: 75 mg/dL (ref 0–99)
Total CHOL/HDL Ratio: 2.2 RATIO
Total CHOL/HDL Ratio: 2.1 RATIO
Triglycerides: 75 mg/dL (ref <150)
Triglycerides: 104 mg/dL (ref <150)
VLDL: 15 mg/dL (ref 0–40)
VLDL: 21 mg/dL (ref 0–40)

## 2021-06-25 LAB — PHOSPHORUS
Phosphorus: 4.4 mg/dL (ref 2.5–4.6)
Phosphorus: 4.3 mg/dL (ref 2.5–4.6)

## 2021-06-25 LAB — MAGNESIUM
Magnesium: 1.8 mg/dL (ref 1.7–2.4)
Magnesium: 1.8 mg/dL (ref 1.7–2.4)

## 2021-06-25 LAB — TSH: TSH: 1.117 u[IU]/mL (ref 0.350–4.500)

## 2021-06-25 IMAGING — MR MR MRA HEAD W/O CM
2 series · 18 of 48 positions shown · non-contrast
Comparison: [DATE] MRI head, correlation is also made with
[DATE] CTA head

CLINICAL DATA: Confusion, right facial droop, right-sided weakness,
slurred speech, stroke suspected

EXAM:
MRI HEAD WITHOUT CONTRAST
MRA HEAD WITHOUT CONTRAST
TECHNIQUE: Multiplanar, multi-echo pulse sequences of the brain and surrounding
structures were acquired without intravenous contrast. Angiographic
images of the Circle of Willis were acquired using MRA technique
without intravenous contrast.

[Series 3: ax (id) · axial · 1.0mm · 0.43mm/px · z∈[-107,-28]mm · 17 of 176 slices shown]
[im 1/176]
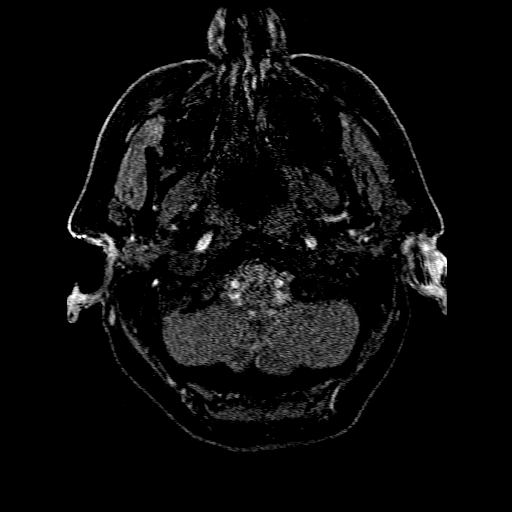
[im 4/176]
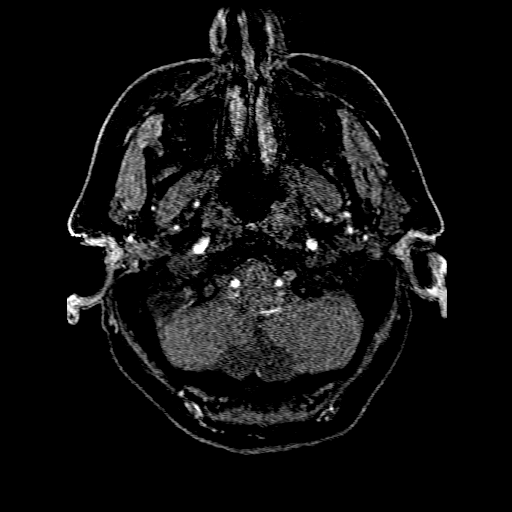
[im 8/176]
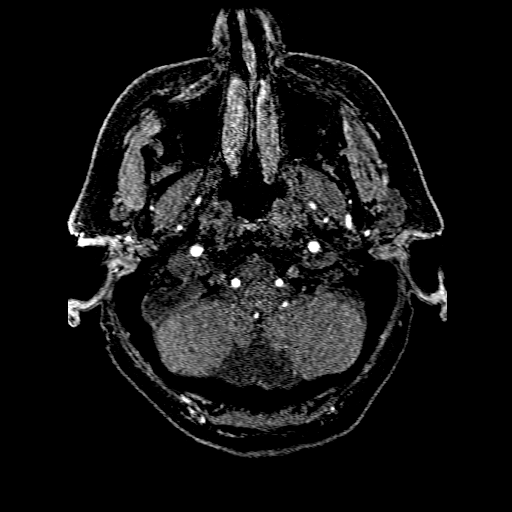
[im 12/176]
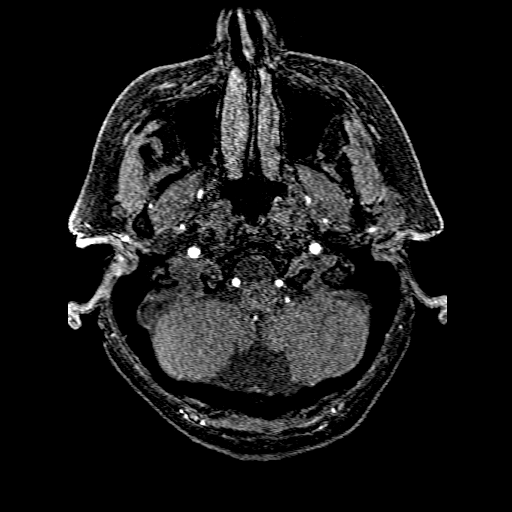
[im 16/176]
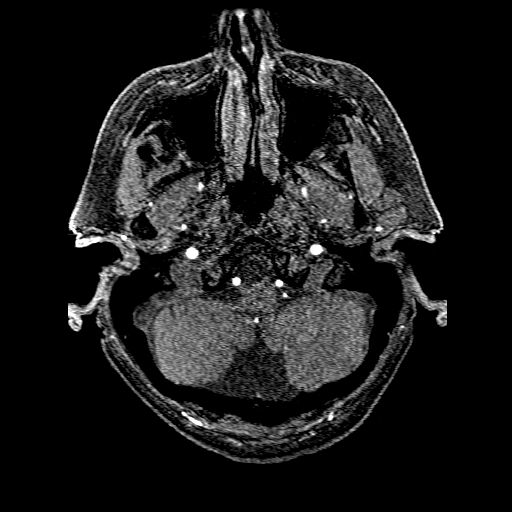
[im 20/176]
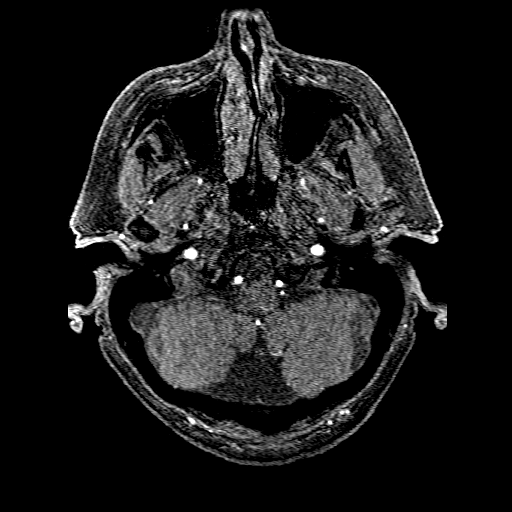
[im 23/176]
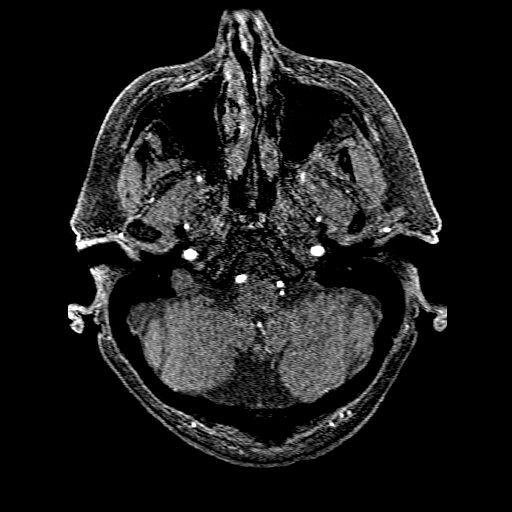
[im 27/176]
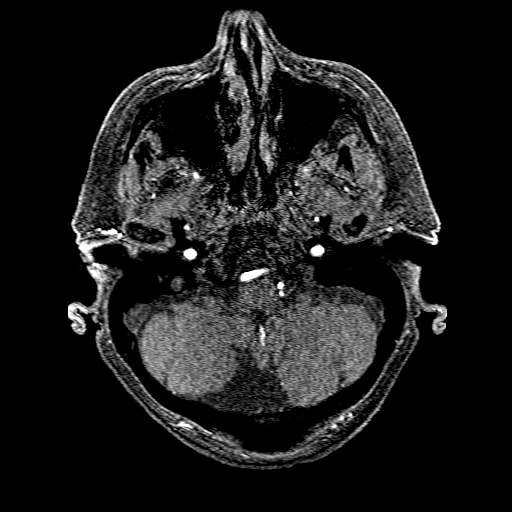
[im 31/176]
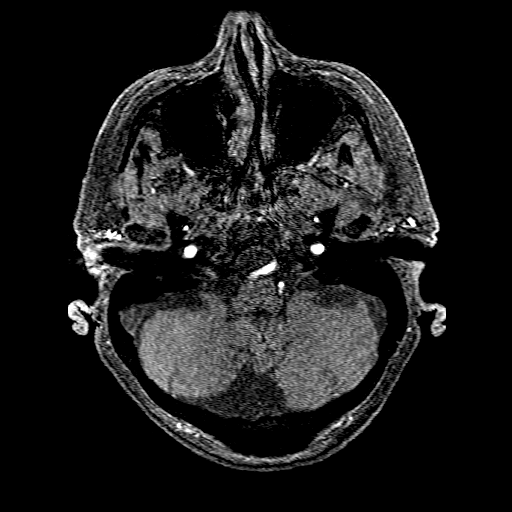
[im 54/176]
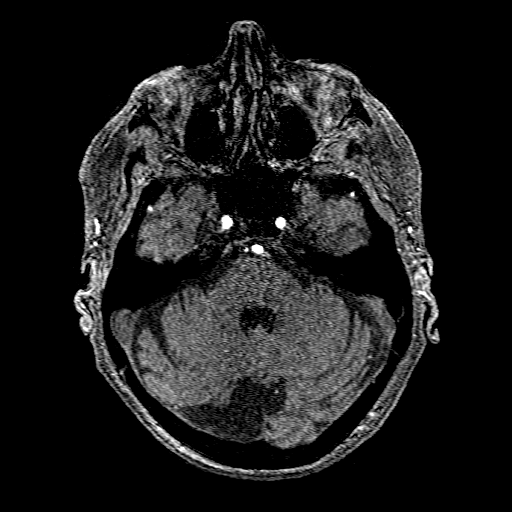
[im 77/176]
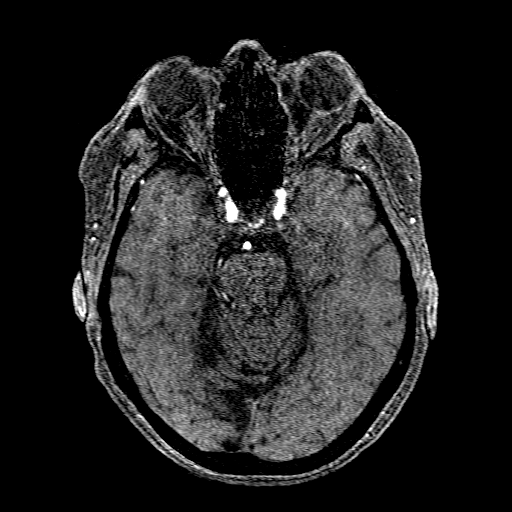
[im 88/176]
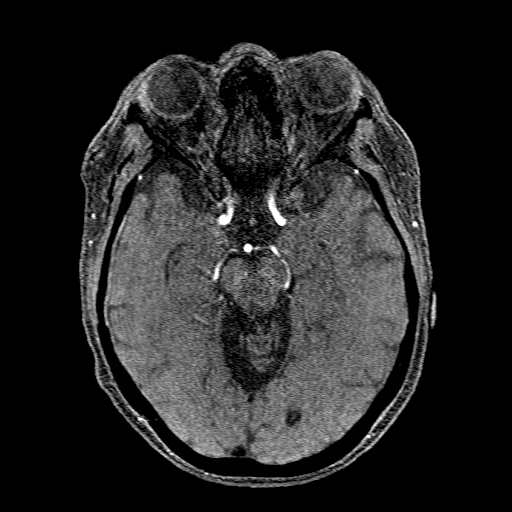
[im 99/176]
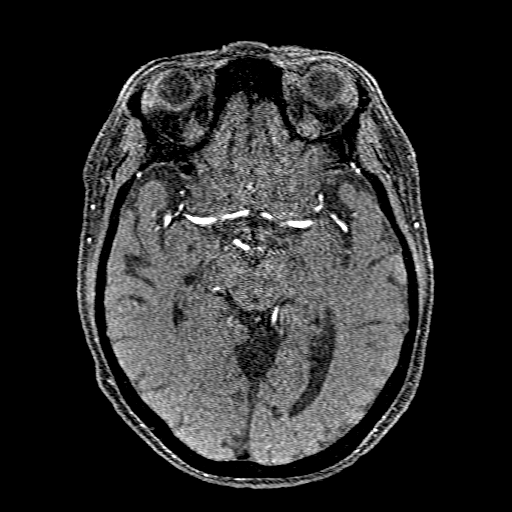
[im 122/176]
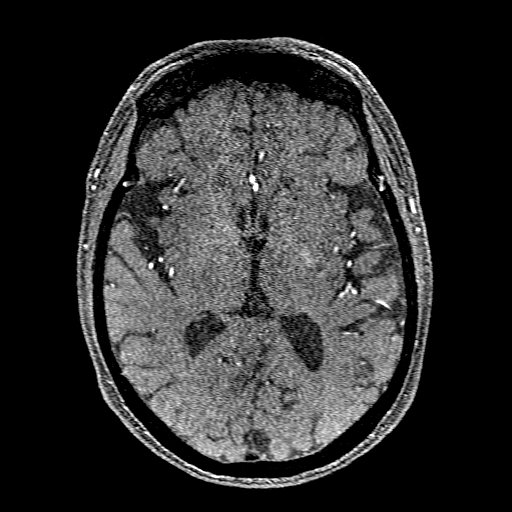
[im 145/176]
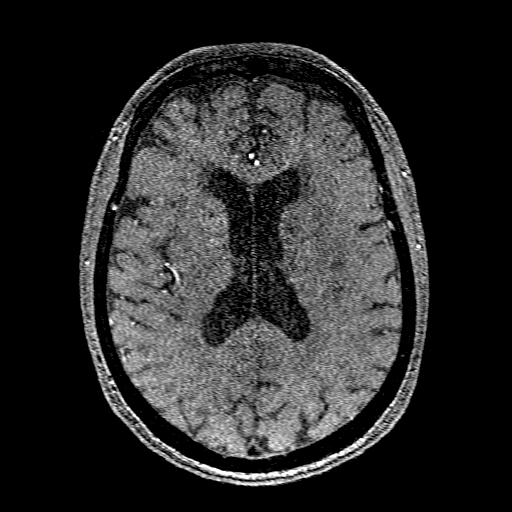
[im 149/176]
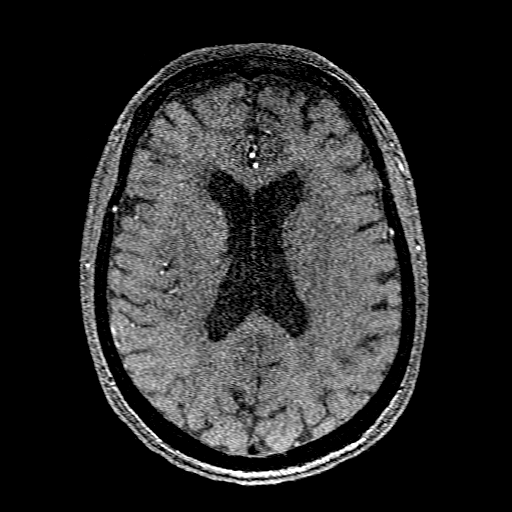
[im 168/176]
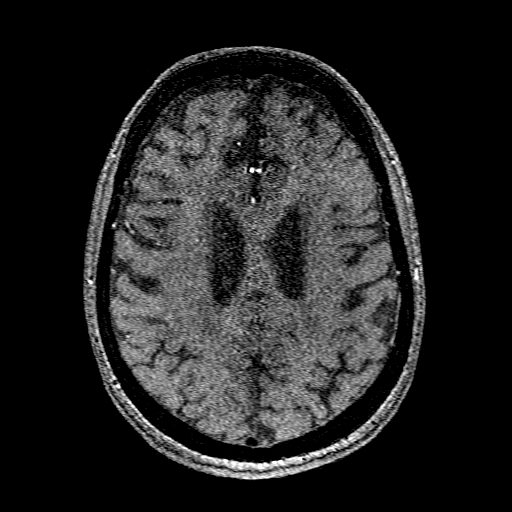

[Series 301: pjn:ax (id) · sagittal · 1.0mm · 0.43mm/px · 1 of 4 slices shown]
[im 1/4]
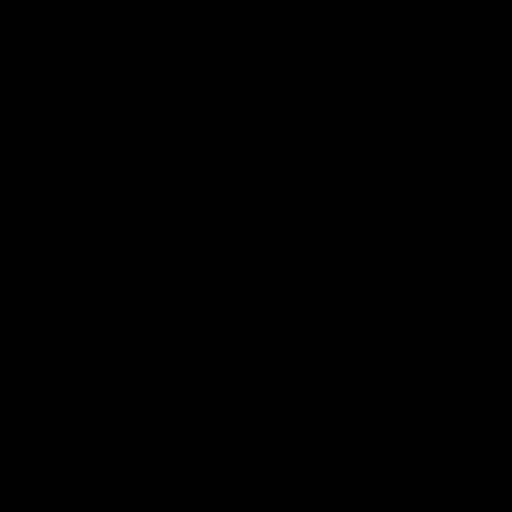

[18 of 48 positions shown; findings below may reference images not displayed]

FINDINGS: MRI HEAD FINDINGS

Brain: Restricted diffusion with ADC correlate in the left basal
ganglia (series 2, images 29-32), likely acute infarct. No acute
hemorrhage, mass, mass effect, or midline shift. No extra-axial
collection or hydrocephalus. Confluent T2 hyperintense signal in the
periventricular white matter and pons, likely the sequela of severe
chronic small vessel ischemic disease. Unchanged right greater than
left posterior fossa arachnoid cysts versus mega cisterna magna.

Vascular: Please see MRA findings below

Skull and upper cervical spine: Normal marrow signal.

Sinuses/Orbits: No acute or significant finding. Status post
bilateral lens replacements.

Other: The mastoids are well aerated.

MRA HEAD FINDINGS

Anterior circulation: Both internal carotid arteries are patent to
the termini, with mild narrowing in the right supraclinoid segment
but without significant stenosis or other abnormality. A1 segments
patent. Normal anterior communicating artery. Anterior cerebral
arteries are patent to their distal aspects. No M1 stenosis or
occlusion. Normal MCA bifurcations. Distal MCA branches perfused and
symmetric.

Posterior circulation: Vertebral arteries patent to the
vertebrobasilar junction without stenosis. Basilar patent to its
distal aspect. Superior cerebellar arteries patent bilaterally. PCAs
perfused to their distal aspects without focal stenosis, although
the left proximal P2 is diminutive, likely diffuse narrowing. The
bilateral posterior communicating arteries are not visualized.

Anatomic variants: None significant
IMPRESSION: 1. Acute infarct in the left basal ganglia.
2. No intracranial large vessel occlusion or severe stenosis. Mild
diffuse narrowing in the left P2 segment.

These results were called by telephone at the time of interpretation
on [DATE] at [DATE] to provider DOLOKO , who verbally
acknowledged these results.

## 2021-06-25 IMAGING — MR MR HEAD W/O CM
7 of 13 series · 20 of 48 positions shown · non-contrast
Comparison: [DATE] MRI head, correlation is also made with
[DATE] CTA head

CLINICAL DATA: Confusion, right facial droop, right-sided weakness,
slurred speech, stroke suspected

EXAM:
MRI HEAD WITHOUT CONTRAST
MRA HEAD WITHOUT CONTRAST
TECHNIQUE: Multiplanar, multi-echo pulse sequences of the brain and surrounding
structures were acquired without intravenous contrast. Angiographic
images of the Circle of Willis were acquired using MRA technique
without intravenous contrast.

[Series 2: DWI · axial · 3.0mm · 0.94mm/px · z∈[-125,+18]mm · 6 of 104 slices shown (1 of 2)]
[im 1/104]
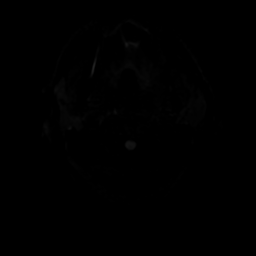
[im 21/104]
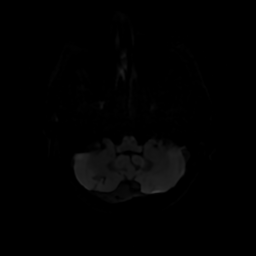
[im 42/104]
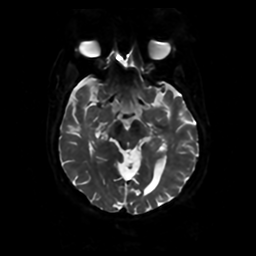
[im 62/104]
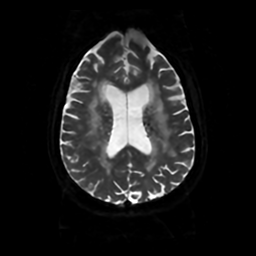
[im 83/104]
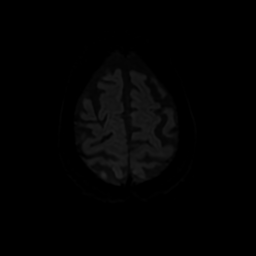
[im 104/104]
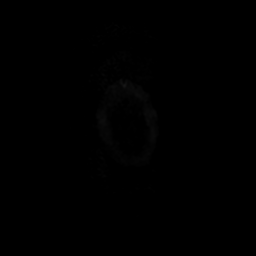

[Series 4: DWI · coronal · 4.0mm · 0.94mm/px · 4 of 74 slices shown (2 of 2)]
[im 1/74]
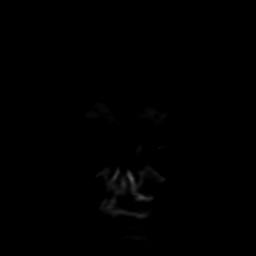
[im 25/74]
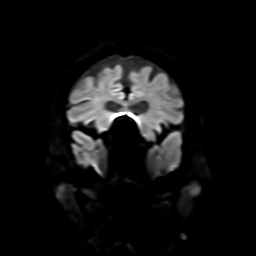
[im 49/74]
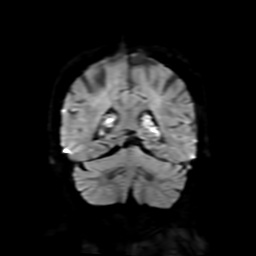
[im 74/74]
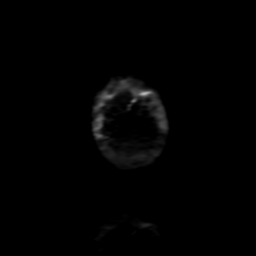

[Series 5: FLAIR · sagittal · 5.0mm · 0.23mm/px · 1 of 23 slices shown (1 of 2)]
[im 1/23]
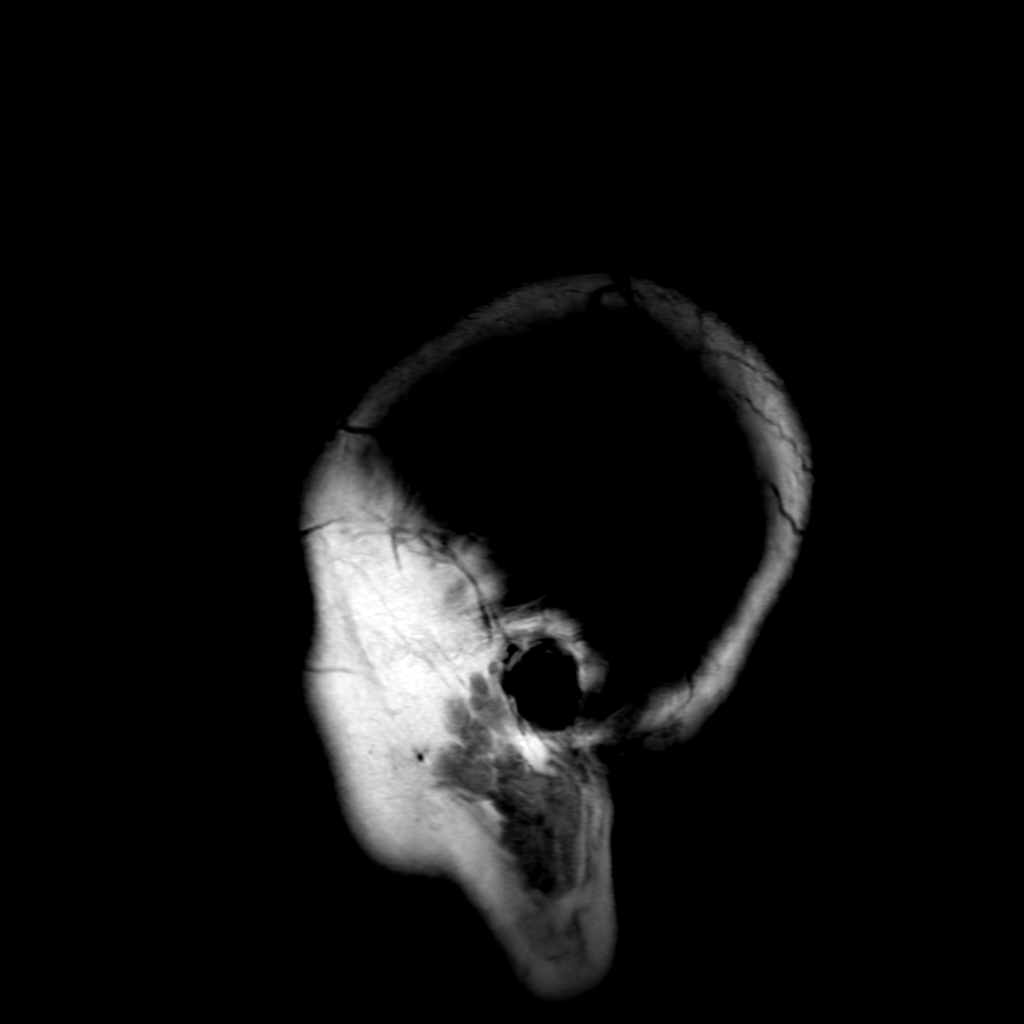

[Series 6: T2 · axial · 5.0mm · 0.23mm/px · z∈[-119,+21]mm · 2 of 26 slices shown]
[im 1/26]
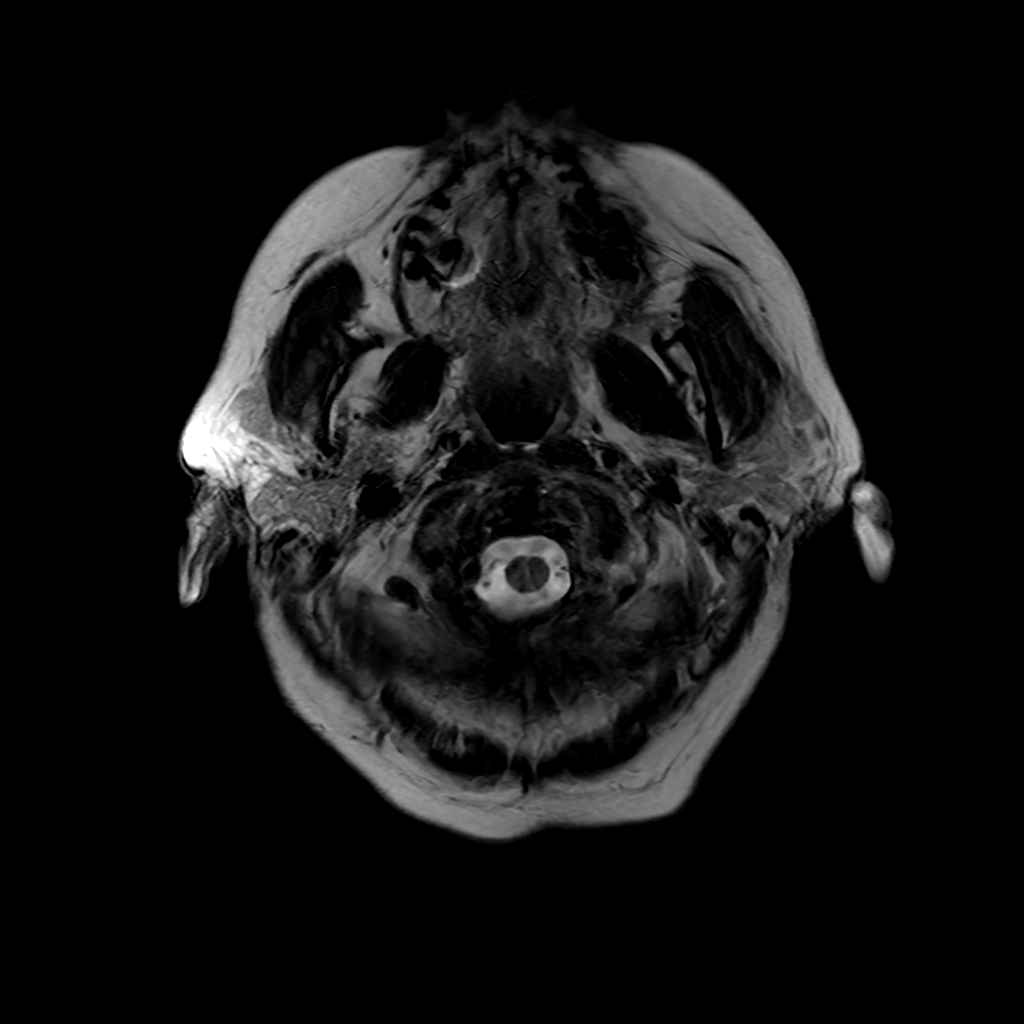
[im 26/26]
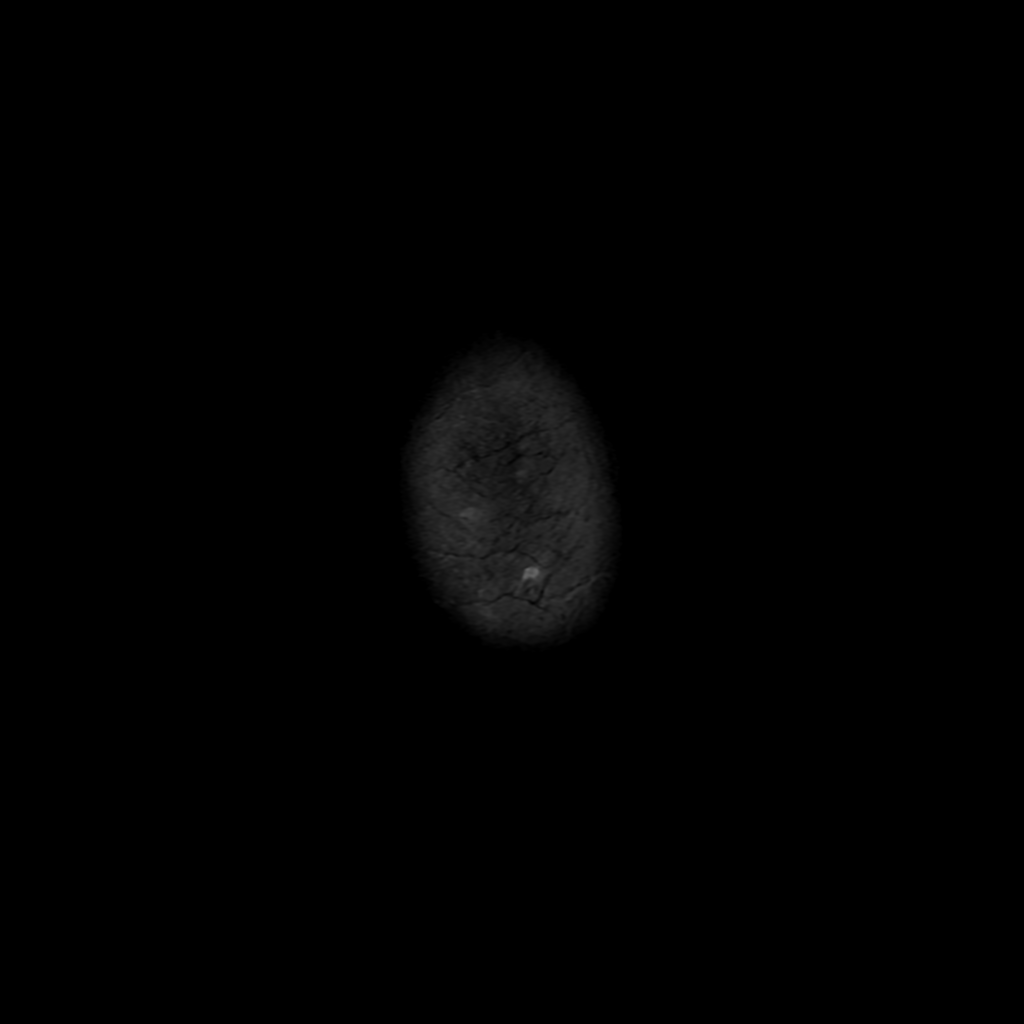

[Series 7: FLAIR · axial · 4.0mm · 0.45mm/px · z∈[-121,+24]mm · 2 of 36 slices shown (2 of 2)]
[im 1/36]
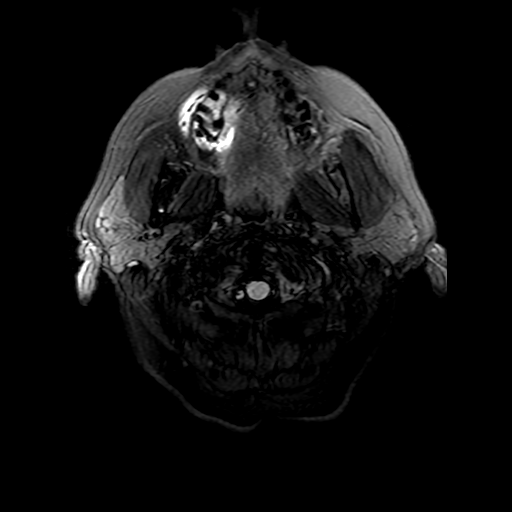
[im 36/36]
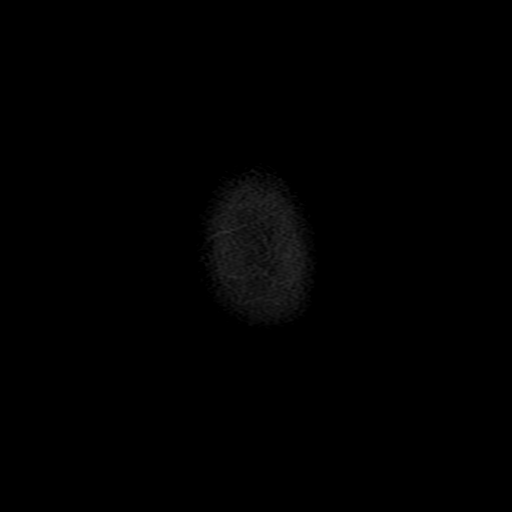

[Series 250: ADC · axial · 3.0mm · 0.94mm/px · z∈[-125,+18]mm · 3 of 52 slices shown (1 of 2)]
[im 1/52]
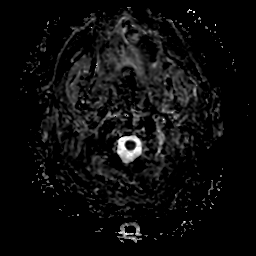
[im 26/52]
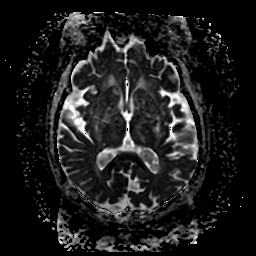
[im 52/52]
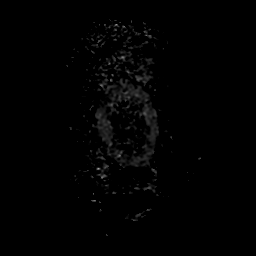

[Series 450: ADC · coronal · 4.0mm · 0.94mm/px · 2 of 35 slices shown (2 of 2)]
[im 1/35]
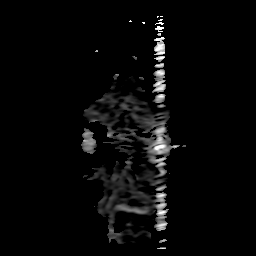
[im 35/35]
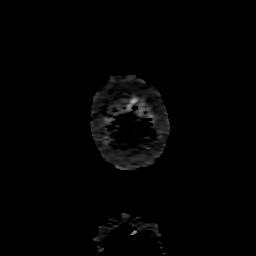

[20 of 48 positions shown; findings below may reference images not displayed]

FINDINGS: MRI HEAD FINDINGS

Brain: Restricted diffusion with ADC correlate in the left basal
ganglia (series 2, images 29-32), likely acute infarct. No acute
hemorrhage, mass, mass effect, or midline shift. No extra-axial
collection or hydrocephalus. Confluent T2 hyperintense signal in the
periventricular white matter and pons, likely the sequela of severe
chronic small vessel ischemic disease. Unchanged right greater than
left posterior fossa arachnoid cysts versus mega cisterna magna.

Vascular: Please see MRA findings below

Skull and upper cervical spine: Normal marrow signal.

Sinuses/Orbits: No acute or significant finding. Status post
bilateral lens replacements.

Other: The mastoids are well aerated.

MRA HEAD FINDINGS

Anterior circulation: Both internal carotid arteries are patent to
the termini, with mild narrowing in the right supraclinoid segment
but without significant stenosis or other abnormality. A1 segments
patent. Normal anterior communicating artery. Anterior cerebral
arteries are patent to their distal aspects. No M1 stenosis or
occlusion. Normal MCA bifurcations. Distal MCA branches perfused and
symmetric.

Posterior circulation: Vertebral arteries patent to the
vertebrobasilar junction without stenosis. Basilar patent to its
distal aspect. Superior cerebellar arteries patent bilaterally. PCAs
perfused to their distal aspects without focal stenosis, although
the left proximal P2 is diminutive, likely diffuse narrowing. The
bilateral posterior communicating arteries are not visualized.

Anatomic variants: None significant
IMPRESSION: 1. Acute infarct in the left basal ganglia.
2. No intracranial large vessel occlusion or severe stenosis. Mild
diffuse narrowing in the left P2 segment.

These results were called by telephone at the time of interpretation
on [DATE] at [DATE] to provider DOLOKO , who verbally
acknowledged these results.

## 2021-06-25 MED ORDER — IRBESARTAN 300 MG PO TABS
300.0000 mg | ORAL_TABLET | Freq: Every day | ORAL | Status: DC
  Filled 2021-06-25: qty 1

## 2021-06-25 MED ORDER — CHLORTHALIDONE 25 MG PO TABS: 25.0000 mg | ORAL_TABLET | Freq: Every day | ORAL | Status: DC

## 2021-06-25 MED ORDER — ROSUVASTATIN CALCIUM 5 MG PO TABS
10.0000 mg | ORAL_TABLET | Freq: Every day | ORAL | Status: DC
  Administered 2021-06-26 – 2021-06-28 (×3): 10 mg via ORAL
  Filled 2021-06-25 (×3): qty 2

## 2021-06-25 MED ORDER — ACETAMINOPHEN 650 MG RE SUPP: 650.0000 mg | RECTAL | Status: DC | PRN

## 2021-06-25 MED ORDER — TIMOLOL MALEATE 0.5 % OP SOLN
1.0000 [drp] | Freq: Two times a day (BID) | OPHTHALMIC | Status: DC
  Administered 2021-06-25 – 2021-06-28 (×6): 1 [drp] via OPHTHALMIC
  Filled 2021-06-25 (×2): qty 5

## 2021-06-25 MED ORDER — SODIUM CHLORIDE 0.9 % IV SOLN: INTRAVENOUS | Status: DC

## 2021-06-25 MED ORDER — ACETAMINOPHEN 325 MG PO TABS: 650.0000 mg | ORAL_TABLET | ORAL | Status: DC | PRN

## 2021-06-25 MED ORDER — ROSUVASTATIN CALCIUM 20 MG PO TABS: 20.0000 mg | ORAL_TABLET | Freq: Every day | ORAL | Status: DC

## 2021-06-25 MED ORDER — ACETAMINOPHEN 160 MG/5ML PO SOLN: 650.0000 mg | ORAL | Status: DC | PRN

## 2021-06-25 MED ORDER — ROSUVASTATIN CALCIUM 5 MG PO TABS: 5.0000 mg | ORAL_TABLET | Freq: Every day | ORAL | Status: DC

## 2021-06-25 MED ORDER — TICAGRELOR 90 MG PO TABS
90.0000 mg | ORAL_TABLET | Freq: Two times a day (BID) | ORAL | Status: DC
  Administered 2021-06-25 – 2021-06-28 (×6): 90 mg via ORAL
  Filled 2021-06-25 (×6): qty 1

## 2021-06-25 MED ORDER — SENNOSIDES-DOCUSATE SODIUM 8.6-50 MG PO TABS: 1.0000 | ORAL_TABLET | Freq: Every evening | ORAL | Status: DC | PRN

## 2021-06-25 MED ORDER — BRINZOLAMIDE 1 % OP SUSP
1.0000 [drp] | Freq: Two times a day (BID) | OPHTHALMIC | Status: DC
  Administered 2021-06-25 – 2021-06-28 (×5): 1 [drp] via OPHTHALMIC
  Filled 2021-06-25 (×2): qty 10

## 2021-06-25 MED ORDER — STROKE: EARLY STAGES OF RECOVERY BOOK
Freq: Once | Status: AC
  Filled 2021-06-25 (×2): qty 1

## 2021-06-25 MED ORDER — ASPIRIN EC 81 MG PO TBEC
81.0000 mg | DELAYED_RELEASE_TABLET | Freq: Every day | ORAL | Status: DC
  Administered 2021-06-26 – 2021-06-28 (×3): 81 mg via ORAL
  Filled 2021-06-25 (×3): qty 1

## 2021-06-25 NOTE — Plan of Care (Signed)

## 2021-06-25 NOTE — Evaluation (Signed)
Occupational Therapy Evaluation Patient Details Name: Sandra Becker MRN: [id number] DOB: [birth year] Today's Date: 06/25/2021   History of Present Illness Pt is an 80 y/o female admitted 11/27 secondary to speech difficulty and R sided weakness. 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 PMH includes breast cancer s/p bilateral mastectomy, glaucoma, HTN, and TIA, husband reports recent herniated disc and L hip OA.   Clinical Impression   PTA pt modified independent with mobility, ADL and IADL, including driving. Pt presents with a significant change in functional status, requiring up to Mod A at times to prevent falls x 3 when back and  R sidestepping, and requires mod A with ADL tasks due to deficits listed below. Recommend post acute rehab at [facility] to maximize functional level of independence and facilitate safe DC home. Will follow acutely.      Recommendations for follow up therapy are one component of a multi-disciplinary discharge planning process, led by the attending physician.  Recommendations may be updated based on patient status, additional functional criteria and insurance authorization.   Follow Up Recommendations  Acute inpatient rehab (3hours/day)    Assistance Recommended at Discharge Frequent or constant Supervision/Assistance  Functional Status Assessment  Patient has had a recent decline in their functional status and demonstrates the ability to make significant improvements in function in a reasonable and predictable amount of time.  Equipment Recommendations       Recommendations for Other Services Rehab consult     Precautions / Restrictions Precautions Precautions: Fall Restrictions Weight Bearing Restrictions: No      Mobility Bed Mobility Overal bed mobility: Needs Assistance Bed Mobility: Supine to Sit;Sit to Supine     Supine to sit: Min assist Sit to supine: Mod assist to lift BLE back up onto bed   General bed mobility comments: Required  multimodal cues for sequencing. Min A for trunk assist to come to sitting. Increased time required to scoot to EOB.    Transfers Overall transfer level: Needs assistance Equipment used: 1 person hand held assist Transfers: Sit to/from Stand Sit to Stand: Min assist           General transfer comment: 3 near falls when stepping backwards and toward R, requiring up to Mod A      Balance Overall balance assessment: Needs assistance Sitting-balance support: Feet supported;No upper extremity supported Sitting balance-Leahy Scale: Fair     Standing balance support: Single extremity supported;During functional activity Standing balance-Leahy Scale: Poor Standing balance comment: Reliant on at least 1 UE support                           ADL either performed or assessed with clinical judgement   ADL                                               Vision Baseline Vision/History: 1 Wears glasses Ability to See in Adequate Light:  (reading) Patient Visual Report:  (will further assess; hit all targets during confrontation testing)       Perception Perception Comments: Poor awareness of orientation in space during mobility; running therapist into wall and chairs on R   Praxis Praxis Praxis-Other Comments: will further assess    Pertinent Vitals/Pain Pain Assessment: No/denies pain     Hand Dominance Right   Extremity/Trunk Assessment Upper Extremity Assessment  Upper Extremity Assessment: RUE deficits/detail RUE Deficits / Details: AROM overall WFL; slow coordinated movements; "clumsy hand" using functionally with difficulty RUE Coordination: decreased fine motor   Lower Extremity Assessment Lower Extremity Assessment: Defer to PT evaluation LLE Deficits / Details: L hip deficits at baseline   Cervical / Trunk Assessment Cervical / Trunk Assessment:  (herniated disc at baseline)   Communication Communication Communication: Expressive  difficulties   Cognition Arousal/Alertness: Awake/alert Behavior During Therapy: Flat affect Overall Cognitive Status: Impaired/Different from baseline Area of Impairment: Problem solving;Following commands;Safety/judgement;Attention;Awareness                       Following Commands: Follows one step commands with increased time Safety/Judgement: Decreased awareness of deficits;Decreased awareness of safety Awareness: Emergent Problem Solving: Slow processing;Difficulty sequencing;Requires verbal cues General Comments: 3 near falls during session and pt did not appear concerned; unaware of having lines tangled through R hand     General Comments  Pt's husband present    Exercises     Shoulder Instructions      Home Living Family/patient expects to be discharged to:: Private residence ([place]) Living Arrangements: Spouse/significant other Available Help at Discharge: Family Type of Home: Independent living facility Home Access: [place]: One level     Bathroom Shower/Tub: Occupational psychologist: Standard Bathroom Accessibility: Yes How Accessible: Accessible via walker;Accessible via wheelchair Home Equipment: Revloc (2 wheels);Wheelchair - manual (have access to RW and WC through [facility])          Prior Functioning/Environment Prior Level of Function : Independent/Modified Independent             Mobility Comments: was going to outpt PT; driving; enjoys water aerobics          OT Problem List: Decreased strength;Decreased activity tolerance;Impaired balance (sitting and/or standing);Impaired vision/perception;Decreased coordination;Decreased cognition;Decreased safety awareness;Decreased knowledge of use of DME or AE;Impaired UE functional use      OT Treatment/Interventions: Self-care/ADL training;Therapeutic exercise;Neuromuscular education;DME and/or AE instruction;Therapeutic  activities;Cognitive remediation/compensation;Visual/perceptual remediation/compensation;Patient/family education;Balance training    OT Goals(Current goals can be found in the care plan section) Acute Rehab OT Goals Patient Stated Goal: to get some rehab OT Goal Formulation: With patient/family Time For Goal Achievement: 07/09/21 Potential to Achieve Goals: Good  OT Frequency: Min 2X/week   Barriers to D/C:            Co-evaluation              AM-PAC OT "6 Clicks" Daily Activity     Outcome Measure Help from another person eating meals?: Total (NPO) Help from another person taking care of personal grooming?: A Little Help from another person toileting, which includes using toliet, bedpan, or urinal?: A Little Help from another person bathing (including washing, rinsing, drying)?: A Lot Help from another person to put on and taking off regular upper body clothing?: A Little Help from another person to put on and taking off regular lower body clothing?: A Lot 6 Click Score: 14   End of Session Equipment Utilized During Treatment: Gait belt Nurse Communication: Mobility status  Activity Tolerance: Patient tolerated treatment well Patient left: in bed;with call bell/phone within reach;with family/visitor present  OT Visit Diagnosis: Unsteadiness on feet (R26.81);Other abnormalities of gait and mobility (R26.89);Muscle weakness (generalized) (M62.81);Other symptoms and signs involving cognitive function                Time: 1093-2355 OT Time  Calculation (min): 34 min Charges:  OT General Charges $OT Visit: 1 Visit OT Evaluation $OT Eval Moderate Complexity: 1 Mod OT Treatments $Self Care/Home Management : 8-22 mins  Maurie Boettcher, OT/L   Acute OT Clinical Specialist Acute Rehabilitation Services Pager [phone] Office [phone]   [facility] 06/25/2021, 2:26 PM

## 2021-06-25 NOTE — Progress Notes (Signed)
? ?  Inpatient Rehab Admissions Coordinator : ? ?Per therapy recommendations, patient was screened for CIR candidacy by Tambra Muller RN MSN.  At this time patient appears to be a potential candidate for CIR. I will place a rehab consult per protocol for full assessment. Please call me with any questions. ? ?Azyria Osmon RN MSN ?Admissions Coordinator ?[phone] ?  ?

## 2021-06-25 NOTE — Progress Notes (Signed)
OT Cancellation Note  Patient Details Name: Sandra Becker MRN: [id number] DOB: 08/04/1940   Cancelled Treatment:    Reason Eval/Treat Not Completed: Patient at procedure or test/ unavailable (Being set up for EEG. Will return at a later time.)  [facility] 06/25/2021, 9:40 AM Maurie Boettcher, OT/L   Acute OT Clinical Specialist [place] Pager [phone] Office [phone]

## 2021-06-25 NOTE — Procedures (Signed)
Patient Name: Sandra Becker  MRN: [id number]  Epilepsy Attending: Lora Havens  Referring Physician/Provider: Dr Donnetta Simpers Date: 06/25/2021 Duration: 23.06 mins  Patient history: 80 year old female with right upper extremity weakness and word finding difficulty, similar episode in October 2022.  EEG evaluate for seizure.  Level of alertness: Awake, asleep  AEDs during EEG study: None  Technical aspects: This EEG study was done with scalp electrodes positioned according to the 10-20 International system of electrode placement. Electrical activity was acquired at a sampling rate of 500Hz  and reviewed with a high frequency filter of 70Hz  and a low frequency filter of 1Hz . EEG data were recorded continuously and digitally stored.   Description: The posterior dominant rhythm consists of 8 Hz activity of moderate voltage (25-35 uV) seen predominantly in posterior head regions, symmetric and reactive to eye opening and eye closing. Sleep was characterized by vertex waves, sleep spindles (12 to 14 Hz), maximal frontocentral region. Hyperventilation and photic stimulation were not performed.     IMPRESSION: This study is within normal limits. No seizures or epileptiform discharges were seen throughout the recording.  [name] [name]

## 2021-06-25 NOTE — Evaluation (Addendum)
Physical Therapy Evaluation Patient Details Name: Sandra Becker MRN: [id number] DOB: Jul 03, 1941 Today's Date: 06/25/2021  History of Present Illness  Pt is an 80 y/o female admitted 11/27 secondary to speech difficulty and R sided weakness. 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 PMH includes breast cancer s/p bilateral mastectomy, glaucoma, HTN, and TIA.  Clinical Impression  Pt admitted secondary to problem above with deficits below. Pt requiring min A for bed mobility and transfers and min guard A for gait within the room. Unsteadiness noted along with cognitive and speech deficits. Feel pt would benefit from CIR level therapies as pt was previously independent. Will continue to follow acutely.      Recommendations for follow up therapy are one component of a multi-disciplinary discharge planning process, led by the attending physician.  Recommendations may be updated based on patient status, additional functional criteria and insurance authorization.  Follow Up Recommendations Acute inpatient rehab (3hours/day)    Assistance Recommended at Discharge Frequent or constant Supervision/Assistance  Functional Status Assessment Patient has had a recent decline in their functional status and demonstrates the ability to make significant improvements in function in a reasonable and predictable amount of time.  Equipment Recommendations  None recommended by PT    Recommendations for Other Services       Precautions / Restrictions Precautions Precautions: Fall Restrictions Weight Bearing Restrictions: No      Mobility  Bed Mobility Overal bed mobility: Needs Assistance Bed Mobility: Supine to Sit;Sit to Supine     Supine to sit: Min assist Sit to supine: Min assist   General bed mobility comments: Required multimodal cues for sequencing. Min A for trunk assist to come to sitting. Increased time required to scoot to EOB.    Transfers Overall transfer level: Needs  assistance Equipment used: 1 person hand held assist Transfers: Sit to/from Stand Sit to Stand: Min assist           General transfer comment: One mild LOB upon standing and required min A for steadying.    Ambulation/Gait Ambulation/Gait assistance: Min guard Gait Distance (Feet): 10 Feet Assistive device: 1 person hand held assist Gait Pattern/deviations: Step-through pattern;Decreased stride length Gait velocity: Decreased     General Gait Details: Mild unsteadiness noted with gait within the room. Min guard for safety. Educated about using RW at home to increase safety.  Stairs            Wheelchair Mobility    Modified Rankin (Stroke Patients Only) Modified Rankin (Stroke Patients Only) Pre-Morbid Rankin Score: No significant disability Modified Rankin: Moderately severe disability     Balance Overall balance assessment: Needs assistance Sitting-balance support: Feet supported;No upper extremity supported Sitting balance-Leahy Scale: Fair     Standing balance support: Single extremity supported;During functional activity Standing balance-Leahy Scale: Poor Standing balance comment: Reliant on at least 1 UE support                             Pertinent Vitals/Pain Pain Assessment: No/denies pain    Home Living Family/patient expects to be discharged to:: Private residence Living Arrangements: Spouse/significant other Available Help at Discharge: Family Type of Home: Independent living facility Home Access: Elevator       Home Layout: One level Home Equipment: Conservation officer, nature (2 wheels);Wheelchair - manual (have access to RW and WC through [facility])      Prior Function Prior Level of Function : Independent/Modified Independent  Hand Dominance        Extremity/Trunk Assessment   Upper Extremity Assessment Upper Extremity Assessment: Defer to OT evaluation    Lower Extremity Assessment Lower  Extremity Assessment: LLE deficits/detail LLE Deficits / Details: L hip deficits at baseline    Cervical / Trunk Assessment Cervical / Trunk Assessment: Normal  Communication   Communication: Expressive difficulties  Cognition Arousal/Alertness: Awake/alert Behavior During Therapy: Flat affect Overall Cognitive Status: Impaired/Different from baseline Area of Impairment: Problem solving;Following commands;Safety/judgement                       Following Commands: Follows one step commands with increased time Safety/Judgement: Decreased awareness of deficits;Decreased awareness of safety   Problem Solving: Slow processing;Difficulty sequencing;Requires verbal cues          General Comments General comments (skin integrity, edema, etc.): Pt's husband present    Exercises     Assessment/Plan    PT Assessment Patient needs continued PT services  PT Problem List Decreased strength;Decreased activity tolerance;Decreased balance;Decreased mobility;Decreased cognition;Decreased coordination;Decreased knowledge of use of DME;Decreased safety awareness;Decreased knowledge of precautions       PT Treatment Interventions DME instruction;Gait training;Functional mobility training;Therapeutic activities;Therapeutic exercise;Balance training;Patient/family education    PT Goals (Current goals can be found in the Care Plan section)  Acute Rehab PT Goals Patient Stated Goal: to go home PT Goal Formulation: With patient/family Time For Goal Achievement: 07/09/21 Potential to Achieve Goals: Good    Frequency Min 4X/week   Barriers to discharge        Co-evaluation               AM-PAC PT "6 Clicks" Mobility  Outcome Measure Help needed turning from your back to your side while in a flat bed without using bedrails?: A Little Help needed moving from lying on your back to sitting on the side of a flat bed without using bedrails?: A Little Help needed moving to and  from a bed to a chair (including a wheelchair)?: A Little Help needed standing up from a chair using your arms (e.g., wheelchair or bedside chair)?: A Little Help needed to walk in hospital room?: A Little Help needed climbing 3-5 steps with a railing? : A Lot 6 Click Score: 17    End of Session Equipment Utilized During Treatment: Gait belt Activity Tolerance: Patient tolerated treatment well Patient left: in bed;with call bell/phone within reach;with family/visitor present (on stretcher in ED) Nurse Communication: Mobility status PT Visit Diagnosis: Unsteadiness on feet (R26.81);Muscle weakness (generalized) (M62.81);Other symptoms and signs involving the nervous system (E93.810)    Time: 1751-0258 PT Time Calculation (min) (ACUTE ONLY): 16 min   Charges:   PT Evaluation $PT Eval Moderate Complexity: 1 Mod          Reuel Derby, PT, DPT  Acute Rehabilitation Services  Pager: [phone] Office: [phone]   [name] 06/25/2021, 1:55 PM

## 2021-06-25 NOTE — Evaluation (Signed)
Clinical/Bedside Swallow Evaluation Patient Details  Name: Sandra Becker MRN: [id number] Date of Birth: 1941-01-22  Today's Date: 06/25/2021 Time: SLP Start Time (ACUTE ONLY): 1350 SLP Stop Time (ACUTE ONLY): 1425 SLP Time Calculation (min) (ACUTE ONLY): 35 min  Past Medical History:  Past Medical History:  Diagnosis Date   Breast cancer ([place]) 1993   bilateral mastectomy; no chemo or radiation   Glaucoma    Hyperlipidemia    Hypertension    Past Surgical History:  Past Surgical History:  Procedure Laterality Date   MASTECTOMY Bilateral 1993   TONSILLECTOMY AND ADENOIDECTOMY  1947   TUBAL LIGATION  1974   HPI:  80 y.o. female with medical history significant for HTN, glaucoma, TIA, HLD, breast cancer s/p bilateral mastectomy presents for dilation of sudden onset of slurred speech with right-sided weakness.  She reports that she was sitting at home with her husband when she developed slurred speech that was difficult for her to get the words out and she had some right-sided weakness.  Husband called 50.  Symptoms resolved while in the ambulance about 30 minutes after they started.  She denies having any headache, chest pain, palpitations, nausea vomiting diarrhea or abdominal pain.  She was not doing any type of exertion when the symptoms began.  She has not had any change in her diet.  He denies any swelling of her legs.  She been taking her medications as prescribed.  Symptoms are completely resolved at this time.  She denies any visual change with the symptoms.  Lives with her husband.  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  OME grossly  WFL, with slight R weakness noted orally at rest.  Pt passed [facility] swallowing screen, but MRI indicated L basal ganglia infarct, so BSE ordered.  Recommend initiating a regular (consistency)/thin liquid diet with general swallowing precautions in place.  ST will s/o for swallowing as function appears WNL at current time.  ST will s/o for swallowing in acute setting.  Thank you for this consult. SLP Visit Diagnosis: Dysphagia, unspecified (R13.10)    Aspiration Risk  No limitations    Diet Recommendation   Regular/thin liquids  Medication Administration: Whole meds with liquid    Other  Recommendations Oral Care Recommendations: Oral care BID    Recommendations for follow up therapy are one component of a multi-disciplinary discharge planning process, led by the attending physician.  Recommendations may be updated based on patient status, additional functional criteria and insurance authorization.  Follow up Recommendations        Assistance Recommended at Discharge Intermittent Supervision/Assistance  Functional Status Assessment Patient has had a recent decline in their functional status and demonstrates the ability to make significant improvements in function in a reasonable and predictable amount of time.  Frequency and Duration  (evaluation only for swallowing)   (evaluation only)       Prognosis Prognosis for Safe Diet Advancement: Good      Swallow Study   General Date of Onset: 06/24/21 HPI: 80 y.o. female with medical history significant for HTN, glaucoma, TIA, HLD, breast cancer s/p bilateral mastectomy presents for dilation of sudden onset of slurred speech with right-sided weakness.  She reports that  she was sitting at home with her husband when she developed slurred speech that was difficult for her to get the words out and she had some right-sided weakness.  Husband called 3.  Symptoms resolved while in the ambulance about 30 minutes after they started.  She denies having  any headache, chest pain, palpitations, nausea vomiting diarrhea or abdominal pain.  She was not doing any type of exertion when the symptoms began.  She has not had any change in her diet.  He denies any swelling of her legs.  She been taking her medications as prescribed.  Symptoms are completely resolved at this time.  She denies any visual change with the symptoms.  Lives with her husband.  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 Type of Study: Bedside Swallow Evaluation Previous Swallow Assessment: [facility] 06/24/21 passed Diet Prior to this Study: NPO Temperature Spikes Noted: No Respiratory Status: Room air History of Recent Intubation: No Behavior/Cognition: Alert;Cooperative Oral Cavity Assessment: Within Functional Limits Oral Care Completed by SLP: Recent completion by staff Oral Cavity - Dentition: Adequate natural dentition Vision: Functional for self-feeding Self-Feeding Abilities: Able to feed self Patient Positioning: Upright in bed Baseline Vocal Quality: Normal Volitional Cough: Strong Volitional Swallow: Able to elicit    Oral/Motor/Sensory Function Overall Oral Motor/Sensory Function: Within functional limits   Ice Chips Ice chips: Within functional limits Presentation: Spoon   Thin Liquid Thin Liquid: Within functional limits Presentation: Straw;Cup;Self Fed    Nectar Thick Nectar Thick Liquid: Not tested   Honey Thick Honey Thick Liquid: Not tested   Puree Puree: Within functional limits Presentation: Self Fed   Solid     Solid: Within functional limits Presentation: Self Fed      Elvina Sidle, M.S.,CCC-SLP 06/25/2021,4:06 PM

## 2021-06-25 NOTE — Progress Notes (Signed)
PT Cancellation Note  Patient Details Name: DALLY OSHEL MRN: [id number] DOB: 05/09/1941   Cancelled Treatment:    Reason Eval/Treat Not Completed: Patient at procedure or test/unavailable Currently having EEG. Will follow up as schedule allows.   Lou Miner, DPT  Acute Rehabilitation Services  Pager: [phone] Office: [phone]    [name] 06/25/2021, 9:43 AM

## 2021-06-25 NOTE — ED Notes (Signed)
Transport arranged for pt to [phone]

## 2021-06-25 NOTE — Progress Notes (Signed)
PROGRESS NOTE    Sandra Becker  MHD:[id number] DOB: October 19, 1940 DOA: 06/24/2021 PCP: Kelton Pillar, MD   Brief Narrative:  80 y.o. WF PMHx HTN, glaucoma, TIA, HLD, breast cancer s/p bilateral mastectomy   Presents for dilation of sudden onset of slurred speech with right-sided weakness.  She reports that she was sitting at home with her husband when she developed slurred speech that was difficult for her to get the words out and she had some right-sided weakness.  Husband called 49.  Symptoms resolved while in the ambulance about 30 minutes after they started.  She denies having any headache, chest pain, palpitations, nausea vomiting diarrhea or abdominal pain.  She was not doing any type of exertion when the symptoms began.  She has not had any change in her diet.  He denies any swelling of her legs.  She been taking her medications as prescribed.  Symptoms are completely resolved at this time.  She denies any visual change with the symptoms. Lives with her husband.  No tobacco, alcohol, illicit drug use   ED Course: Ms. Mcgurk has been hemodynamically stable in the emergency room.  Speech is back to baseline.  She is able to move all extremities and has no numbness in extremities.  CT of the head was negative for acute pathology.  Sodium 126 which is her baseline level.  Potassium 3.6 chloride 91 bicarb 24 creatinine 0.75 BUN 12 glucose 138 AST 41 ALT 63 alkaline phosphatase 53.  CBC unremarkable.  INR 0.9.    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  No complaints.   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 -TSH pending - Hemoglobin A1c pending - Lipid panel pending - CMP, CBC, magnesium, phosphorus pending. - Allow permissive hypertension <220/110, BP to be titrated  down to normal over the next 48 to 72 hours. - DAPT  - Brilinta 90 mg BID  -Aspirin 81 mg - Crestor 10 mg daily : Depended upon LDL findings may need to increase - PT/OT consult  Essential HTN - Patient with acute stroke will allow permissive HTN - Irbesartan 300 mg daily (hold) <220/110, -Chlorthalidone 25 mg (hold)  Hyponatremia - Normal saline 30ml/hr - Resolving   Hyponatremia   DVT prophylaxis: DAPT Code Status: Full Family Communication:  Status is: Inpatient    Dispo: The patient is from: Home              Anticipated d/c is to: Home              Anticipated d/c date is: 3 days              Patient currently is not medically stable to d/c.      Consultants:  Neurology stroke team   Procedures/Significant Events:  11/28 MRI brain Wo contrast, MRA head -Acute infarct in the left basal ganglia. 2. No intracranial large vessel occlusion or severe stenosis. Mild diffuse narrowing in the left P2 segment  I have personally reviewed and interpreted all radiology studies and my findings are as above.  VENTILATOR SETTINGS:    Cultures   Antimicrobials:    Devices    LINES / TUBES:      Continuous Infusions:  sodium chloride Stopped (06/25/21 0421)     Objective: Vitals:   06/25/21  1300 06/25/21 1500 06/25/21 1700 06/25/21 1717  BP: 136/77 (!) 158/83 (!) 144/60   Pulse: 87 93 90   Resp: 19 11 17    Temp:    98 F (36.7 C)  TempSrc:    Oral  SpO2: 97% 97% 97%   Weight:      Height:        Intake/Output Summary (Last 24 hours) at 06/25/2021 1722 Last data filed at 06/25/2021 1533 Gross per 24 hour  Intake 0.89 ml  Output --  Net 0.89 ml   Filed Weights   06/24/21 1800 06/24/21 1955  Weight: 73.6 kg 73.6 kg    Examination:  General: A/O x4, No acute respiratory distress Eyes: negative scleral hemorrhage, negative anisocoria, negative icterus ENT: Negative Runny nose, negative gingival bleeding, Neck:  Negative scars, masses,  torticollis, lymphadenopathy, JVD Lungs: Clear to auscultation bilaterally without wheezes or crackles Cardiovascular: Regular rate and rhythm without murmur gallop or rub normal S1 and S2 Abdomen: negative abdominal pain, nondistended, positive soft, bowel sounds, no rebound, no ascites, no appreciable mass Extremities: No significant cyanosis, clubbing, or edema bilateral lower extremities Skin: Negative rashes, lesions, ulcers Psychiatric:  Negative depression, negative anxiety, negative fatigue, negative mania  Central nervous system:  Cranial nerves II through XII intact, tongue/uvula midline, all extremities muscle strength 5/5, sensation intact throughout, finger nose finger bilateral within normal limits, quick finger touch bilateral within normal limits, heel to shin bilateral within normal limits, mild left facial droop, mild short-term memory loss, negative dysarthria, negative expressive aphasia, negative receptive aphasia.  .     Data Reviewed: Care during the described time interval was provided by me .  I have reviewed this patient's available data, including medical history, events of note, physical examination, and all test results as part of my evaluation.   CBC: Recent Labs  Lab 06/24/21 1837 06/24/21 1841 06/25/21 0739 06/25/21 1403  WBC 9.8  --  9.1 9.3  NEUTROABS 6.1  --  5.5 5.5  HGB 14.2 15.3* 14.4 14.0  HCT 41.7 45.0 43.5 40.9  MCV 89.9  --  90.6 89.1  PLT 385  --  367 147*   Basic Metabolic Panel: Recent Labs  Lab 06/24/21 1837 06/24/21 1841 06/25/21 0739 06/25/21 1403  NA 126* 126* 127* 130*  K 3.5 3.6 3.8 4.2  CL 91* 91* 92* 93*  CO2 24  --  24 23  GLUCOSE 138* 134* 115* 114*  BUN 12 14 11 12   CREATININE 0.75 0.60 0.52 0.62  CALCIUM 10.1  --  9.5 9.6  MG  --   --  1.8 1.8  PHOS  --   --  4.4 4.3   GFR: Estimated Creatinine Clearance: 56.3 mL/min (by C-G formula based on SCr of 0.62 mg/dL). Liver Function Tests: Recent Labs  Lab  06/24/21 1837 06/25/21 0739 06/25/21 1403  AST 41 38 39  ALT 63* 58* 62*  ALKPHOS 53 50 52  BILITOT 0.7 0.7 0.8  PROT 7.0 6.6 6.8  ALBUMIN 4.1 3.9 4.2   No results for input(s): LIPASE, AMYLASE in the last 168 hours. No results for input(s): AMMONIA in the last 168 hours. Coagulation Profile: Recent Labs  Lab 06/24/21 1837  INR 0.9   Cardiac Enzymes: No results for input(s): CKTOTAL, CKMB, CKMBINDEX, TROPONINI in the last 168 hours. BNP (last 3 results) No results for input(s): PROBNP in the last 8760 hours. HbA1C: No results for input(s): HGBA1C in the last 72 hours. CBG: Recent Labs  Lab  06/24/21 1835  GLUCAP 139*   Lipid Profile: Recent Labs    06/25/21 0508 06/25/21 1403  CHOL 194 183  HDL 88 87  LDLCALC 91 75  TRIG 75 104  CHOLHDL 2.2 2.1   Thyroid Function Tests: Recent Labs    06/25/21 1403  TSH 1.117   Anemia Panel: No results for input(s): VITAMINB12, FOLATE, FERRITIN, TIBC, IRON, RETICCTPCT in the last 72 hours. Urine analysis:    Component Value Date/Time   COLORURINE STRAW (A) 05/03/2021 1423   APPEARANCEUR CLEAR 05/03/2021 1423   LABSPEC 1.016 05/03/2021 1423   PHURINE 8.0 05/03/2021 1423   GLUCOSEU NEGATIVE 05/03/2021 1423   HGBUR NEGATIVE 05/03/2021 [place] 05/03/2021 1423   KETONESUR NEGATIVE 05/03/2021 1423   PROTEINUR NEGATIVE 05/03/2021 1423   UROBILINOGEN 0.2 04/27/2011 2131   NITRITE NEGATIVE 05/03/2021 1423   LEUKOCYTESUR NEGATIVE 05/03/2021 1423   Sepsis Labs: @LABRCNTIP (procalcitonin:4,lacticidven:4)  ) Recent Results (from the past 240 hour(s))  Resp Panel by RT-PCR (Flu A&B, Covid) Nasopharyngeal Swab     Status: None   Collection Time: 06/24/21 11:48 PM   Specimen: Nasopharyngeal Swab; Nasopharyngeal(NP) swabs in vial transport medium  Result Value Ref Range Status   SARS Coronavirus 2 by RT PCR NEGATIVE NEGATIVE Final    Comment: (NOTE) SARS-CoV-2 target nucleic acids are NOT DETECTED.  The  SARS-CoV-2 RNA is generally detectable in upper respiratory specimens during the acute phase of infection. The lowest concentration of SARS-CoV-2 viral copies this assay can detect is 138 copies/mL. A negative result does not preclude SARS-Cov-2 infection and should not be used as the sole basis for treatment or other patient management decisions. A negative result may occur with  improper specimen collection/handling, submission of specimen other than nasopharyngeal swab, presence of viral mutation(s) within the areas targeted by this assay, and inadequate number of viral copies(<138 copies/mL). A negative result must be combined with clinical observations, patient history, and epidemiological information. The expected result is Negative.  Fact Sheet for Patients:  EntrepreneurPulse.com.au  Fact Sheet for Healthcare Providers:  IncredibleEmployment.be  This test is no t yet approved or cleared by the [place] FDA and  has been authorized for detection and/or diagnosis of SARS-CoV-2 by FDA under an Emergency Use Authorization (EUA). This EUA will remain  in effect (meaning this test can be used) for the duration of the COVID-19 declaration under Section 564(b)(1) of the Act, 21 U.S.C.section 360bbb-3(b)(1), unless the authorization is terminated  or revoked sooner.       Influenza A by PCR NEGATIVE NEGATIVE Final   Influenza B by PCR NEGATIVE NEGATIVE Final    Comment: (NOTE) The Xpert Xpress SARS-CoV-2/FLU/RSV plus assay is intended as an aid in the diagnosis of influenza from Nasopharyngeal swab specimens and should not be used as a sole basis for treatment. Nasal washings and aspirates are unacceptable for Xpert Xpress SARS-CoV-2/FLU/RSV testing.  Fact Sheet for Patients: EntrepreneurPulse.com.au  Fact Sheet for Healthcare Providers: IncredibleEmployment.be  This test is not yet approved or  cleared by the [place] FDA and has been authorized for detection and/or diagnosis of SARS-CoV-2 by FDA under an Emergency Use Authorization (EUA). This EUA will remain in effect (meaning this test can be used) for the duration of the COVID-19 declaration under Section 564(b)(1) of the Act, 21 U.S.C. section 360bbb-3(b)(1), unless the authorization is terminated or revoked.  Performed at [facility], [place] [address] [place], [place] [address]  Radiology Studies: MR ANGIO HEAD WO CONTRAST  Result Date: 06/25/2021 CLINICAL DATA:  Confusion, right facial droop, right-sided weakness, slurred speech, stroke suspected EXAM: MRI HEAD WITHOUT CONTRAST MRA HEAD WITHOUT CONTRAST TECHNIQUE: Multiplanar, multi-echo pulse sequences of the brain and surrounding structures were acquired without intravenous contrast. Angiographic images of the Circle of Willis were acquired using MRA technique without intravenous contrast. COMPARISON:  05/03/2021 MRI head, correlation is also made with 05/03/2021 CTA head FINDINGS: MRI HEAD FINDINGS Brain: Restricted diffusion with ADC correlate in the left basal ganglia (series 2, images 29-32), likely acute infarct. No acute hemorrhage, mass, mass effect, or midline shift. No extra-axial collection or hydrocephalus. Confluent T2 hyperintense signal in the periventricular white matter and pons, likely the sequela of severe chronic small vessel ischemic disease. 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 Vascular: Please see MRA findings below Skull and upper cervical spine: Normal marrow signal. Sinuses/Orbits: No acute or significant finding. Status post bilateral lens replacements. Other: The mastoids are well aerated. MRA HEAD FINDINGS Anterior circulation: Both internal carotid arteries are patent to the termini, with mild narrowing in the right supraclinoid segment but without significant stenosis or  other abnormality. A1 segments patent. Normal anterior communicating artery. Anterior cerebral arteries are patent to their distal aspects. No M1 stenosis or occlusion. Normal MCA bifurcations. Distal MCA branches perfused and symmetric. Posterior circulation: Vertebral arteries patent to the vertebrobasilar junction without stenosis. Basilar patent to its distal aspect. Superior cerebellar arteries patent bilaterally. PCAs perfused to their distal aspects without focal stenosis, although the left proximal P2 is diminutive, likely diffuse narrowing. The bilateral posterior communicating arteries are not visualized. Anatomic variants: None significant IMPRESSION: 1. 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 2. No intracranial large vessel occlusion or severe stenosis. Mild diffuse narrowing in the left P2 segment. These results were called by telephone at the time of interpretation on 06/25/2021 at 2:47 am to provider [facility] , who verbally acknowledged these results. Electronically Signed   By: Merilyn Baba M.D.   On: 06/25/2021 02:48   MR BRAIN WO CONTRAST  Result Date: 06/25/2021 CLINICAL DATA:  Confusion, right facial droop, right-sided weakness, slurred speech, stroke suspected EXAM: MRI HEAD WITHOUT CONTRAST MRA HEAD WITHOUT CONTRAST TECHNIQUE: Multiplanar, multi-echo pulse sequences of the brain and surrounding structures were acquired without intravenous contrast. Angiographic images of the Circle of Willis were acquired using MRA technique without intravenous contrast. COMPARISON:  05/03/2021 MRI head, correlation is also made with 05/03/2021 CTA head FINDINGS: MRI HEAD FINDINGS Brain: Restricted diffusion with ADC correlate in the left basal ganglia (series 2, images 29-32), likely acute infarct. No acute hemorrhage, mass, mass effect, or midline shift. No extra-axial collection or hydrocephalus. Confluent T2 hyperintense signal in the periventricular white matter and pons, likely the sequela  of severe chronic small vessel ischemic disease. 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 Vascular: Please see MRA findings below Skull and upper cervical spine: Normal marrow signal. Sinuses/Orbits: No acute or significant finding. Status post bilateral lens replacements. Other: The mastoids are well aerated. MRA HEAD FINDINGS Anterior circulation: Both internal carotid arteries are patent to the termini, with mild narrowing in the right supraclinoid segment but without significant stenosis or other abnormality. A1 segments patent. Normal anterior communicating artery. Anterior cerebral arteries are patent to their distal aspects. No M1 stenosis or occlusion. Normal MCA bifurcations. Distal MCA branches perfused and symmetric. Posterior circulation: Vertebral arteries patent to  the vertebrobasilar junction without stenosis. Basilar patent to its distal aspect. Superior cerebellar arteries patent bilaterally. PCAs perfused to their distal aspects without focal stenosis, although the left proximal P2 is diminutive, likely diffuse narrowing. The bilateral posterior communicating arteries are not visualized. Anatomic variants: None significant IMPRESSION: 1. 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 2. No intracranial large vessel occlusion or severe stenosis. Mild diffuse narrowing in the left P2 segment. These results were called by telephone at the time of interpretation on 06/25/2021 at 2:47 am to provider [facility] , who verbally acknowledged these results. Electronically Signed   By: Merilyn Baba M.D.   On: 06/25/2021 02:48   EEG adult  Result Date: 06/25/2021 Lora Havens, MD     06/25/2021 12:56 PM Patient Name: Sandra Becker MRN: [id number] Epilepsy Attending: Lora Havens Referring Physician/Provider: Dr Donnetta Simpers Date: 06/25/2021 Duration: 23.06 mins Patient history: 80 year old female with right upper extremity weakness  and word finding difficulty, similar episode in October 2022.  EEG evaluate for seizure. Level of alertness: Awake, asleep AEDs during EEG study: None Technical aspects: This EEG study was done with scalp electrodes positioned according to the 10-20 International system of electrode placement. Electrical activity was acquired at a sampling rate of 500Hz  and reviewed with a high frequency filter of 70Hz  and a low frequency filter of 1Hz . EEG data were recorded continuously and digitally stored. Description: The posterior dominant rhythm consists of 8 Hz activity of moderate voltage (25-35 uV) seen predominantly in posterior head regions, symmetric and reactive to eye opening and eye closing. Sleep was characterized by vertex waves, sleep spindles (12 to 14 Hz), maximal frontocentral region. Hyperventilation and photic stimulation were not performed.   IMPRESSION: This study is within normal limits. No seizures or epileptiform discharges were seen throughout the recording. Lora Havens   CT HEAD CODE STROKE WO CONTRAST  Result Date: 06/24/2021 CLINICAL DATA:  Code stroke. Neuro deficit, acute, stroke suspected. Facial droop, slurred speech, right-sided weakness. EXAM: CT HEAD WITHOUT CONTRAST TECHNIQUE: Contiguous axial images were obtained from the base of the skull through the vertex without intravenous contrast. COMPARISON:  Brain MRI 05/03/2021. CT angiogram head/neck 05/03/2021. Head CT 05/03/2021. FINDINGS: Brain: Mild generalized cerebral atrophy. 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 Background moderate/advanced patchy and ill-defined hypoattenuation within the cerebral white matter, nonspecific but compatible chronic small vessel ischemic disease. There is no acute intracranial hemorrhage. No demarcated cortical infarct. No extra-axial fluid collection. No evidence of an intracranial mass. No midline shift. Vascular: No  hyperdense vessel.  Atherosclerotic calcifications. Skull: Normal. Negative for fracture or focal lesion. Sinuses/Orbits: Visualized orbits show no acute finding. Mild mucosal thickening within the bilateral ethmoid air cells. Chronic medially displaced fracture deformity of the right lamina papyracea. ASPECTS ([place] Stroke Program Early CT Score) - Ganglionic level infarction (caudate, lentiform nuclei, internal capsule, insula, M1-M3 cortex): 7 - Supraganglionic infarction (M4-M6 cortex): 3 Total score (0-10 with 10 being normal): 10 These results were called by telephone at the time of interpretation on 06/24/2021 at 6:51 pm to provider Dr. Quinn Axe, who verbally acknowledged these results. IMPRESSION: No evidence of acute intracranial abnormality. Redemonstrated chronic lacunar infarcts within the bilateral deep gray nuclei and right cerebellar hemisphere. Moderate/advanced chronic small vessel ischemic changes within the cerebral white matter. Mild generalized cerebral atrophy. Electronically Signed   By: Kellie Simmering D.O.   On: 06/24/2021 18:53        Scheduled  Meds:   stroke: mapping our early stages of recovery book   Does not apply Once   aspirin EC  81 mg Oral Daily   brinzolamide  1 drop Both Eyes BID   rosuvastatin  10 mg Oral Daily   ticagrelor  90 mg Oral BID   timolol  1 drop Both Eyes BID   Continuous Infusions:  sodium chloride Stopped (06/25/21 0421)     LOS: 0 days   The patient is critically ill with multiple organ systems failure and requires high complexity decision making for assessment and support, frequent evaluation and titration of therapies, application of advanced monitoring technologies and extensive interpretation of multiple databases. Critical Care Time devoted to patient care services described in this note  Time spent: 40 minutes     Chrissa Meetze, Geraldo Docker, MD [facility]   If 7PM-7AM, please contact night-coverage 06/25/2021, 5:22 PM

## 2021-06-25 NOTE — Discharge Instructions (Addendum)
Regarding patient holiday season traveling, we recommend that you should not have air travel for two weeks post stroke based on federal aviation recommendations. We recommend you see your primary care provider 1-2 weeks after discharge for clearance to travel after the recommended two week waiting period. We will set up your follow up with neurology at [facility] in 4 weeks.

## 2021-06-25 NOTE — Progress Notes (Signed)
EEG complete - results pending 

## 2021-06-25 NOTE — Evaluation (Signed)
Speech Language Pathology Evaluation Patient Details Name: Sandra Becker MRN: [id number] DOB: 1941-02-15 Today's Date: 06/25/2021 Time: 2683-4196 SLP Time Calculation (min) (ACUTE ONLY): 45 min  Problem List:  Patient Active Problem List   Diagnosis Date Noted   Essential hypertension 06/24/2021   TIA (transient ischemic attack) 05/03/2021   Hyponatremia    Past Medical History:  Past Medical History:  Diagnosis Date   Breast cancer ([place]) 1993   bilateral mastectomy; no chemo or radiation   Glaucoma    Hyperlipidemia    Hypertension    Past Surgical History:  Past Surgical History:  Procedure Laterality Date   MASTECTOMY Bilateral 1993   TONSILLECTOMY AND ADENOIDECTOMY  1947   TUBAL LIGATION  1974   HPI:  80 y.o. female with medical history significant for HTN, glaucoma, TIA, HLD, breast cancer s/p bilateral mastectomy presents for dilation of sudden onset of slurred speech with right-sided weakness.  She reports that she was sitting at home with her husband when she developed slurred speech that was difficult for her to get the words out and she had some right-sided weakness.  Husband called 34.  Symptoms resolved while in the ambulance about 30 minutes after they started.  She denies having any headache, chest pain, palpitations, nausea vomiting diarrhea or abdominal pain.  She was not doing any type of exertion when the symptoms began.  She has not had any change in her diet.  He denies any swelling of her legs.  She been taking her medications as prescribed.  Symptoms are completely resolved at this time.  She denies any visual change with the symptoms.  Lives with her husband.  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  SLE (speech/language evaluation) generated.  Assessment / Plan / Recommendation Clinical Impression  Pt seen for speech/language and cognitive evaluation via SLUMS examination ([place]  Mental Status Examination) with a score obtained of 19/30 indicating mild cognitive impairment within the areas of attention, memory and organization/word fluency tasks.  Simple calculation problems and repeating digits backwards were challenging areas for patient.  She was able to answer questions from a short paragraph with 100% accuracy and recalled 1/5 words after a time constraint.  She identified 4/5 with a category or F:2 choices, but required these cues to recall vs independently recalling information.  Oriented x4 and speech was 100% intelligible within simple conversation.  Pt demonstrated a flat affect throughout session and experienced difficulty with convergent/divergent naming tasks.  ST will f/u for cognitive impairment while in acute setting and recommend f/u at [facility] when pt is d/c from hospital.  Thank you for this consult.    SLP Assessment  SLP Recommendation/Assessment: Patient needs continued Speech Language Pathology Services SLP Visit Diagnosis: Cognitive communication deficit (R41.841);Attention and concentration deficit Attention and concentration deficit following: Cerebral infarction    Recommendations for follow up therapy are one component of a multi-disciplinary discharge planning process, led by the attending physician.  Recommendations may be updated based on patient status, additional functional criteria and insurance authorization.    Follow Up Recommendations  Follow physician's recommendations for discharge plan and follow up therapies    Assistance Recommended at Discharge  Intermittent Supervision/Assistance  Functional Status Assessment Patient has had a recent decline in their functional status and demonstrates the ability to make significant improvements in function in a reasonable and predictable amount of time.  Frequency and Duration min 2x/week  1 week  SLP Evaluation Cognition  Overall Cognitive Status: Impaired/Different from  baseline Arousal/Alertness: Awake/alert Orientation Level: Oriented X4 Year: 2022 Month: November Day of Week: Correct Attention: Sustained Sustained Attention: Impaired Sustained Attention Impairment: Verbal basic;Functional basic Memory: Impaired Memory Impairment: Decreased recall of new information;Decreased short term memory;Retrieval deficit Decreased Short Term Memory: Verbal basic;Functional basic Immediate Memory Recall: Sock;Blue;Bed Memory Recall Sock: Without Cue Memory Recall Blue: With Cue Memory Recall Bed: With Cue Awareness: Impaired Awareness Impairment: Anticipatory impairment;Intellectual impairment Executive Function: Writer: Impaired Organizing Impairment: Verbal basic;Functional basic Behaviors: Perseveration       Comprehension  Auditory Comprehension Overall Auditory Comprehension: Appears within functional limits for tasks assessed Yes/No Questions: Within Functional Limits Commands: Within Functional Limits Conversation: Complex Interfering Components: Working Marine scientist;Attention EffectiveTechniques: Extra processing time;Repetition Visual Recognition/Discrimination Discrimination: Not tested Reading Comprehension Reading Status: Not tested    Expression Expression Primary Mode of Expression: Verbal Verbal Expression Overall Verbal Expression: Impaired Initiation: No impairment Level of Generative/Spontaneous Verbalization: Conversation Repetition: No impairment Naming: Impairment Responsive: 76-100% accurate Confrontation: Within functional limits Convergent: 25-49% accurate Divergent: 25-49% accurate Other Naming Comments: perseveration Verbal Errors: Perseveration Pragmatics: Impairment Impairments: Abnormal affect Non-Verbal Means of Communication: Not applicable Written Expression Dominant Hand: Right Written Expression: Not tested   Oral / Motor  Oral Motor/Sensory Function Overall Oral Motor/Sensory Function:  Mild impairment Facial Symmetry: Abnormal symmetry right (slight (at rest), adequate for speech/swallowing) Lingual Symmetry: Within Functional Limits Motor Speech Overall Motor Speech: Appears within functional limits for tasks assessed Respiration: Within functional limits Phonation: Normal Resonance: Within functional limits Articulation: Within functional limitis Intelligibility: Intelligible Motor Planning: Witnin functional limits Motor Speech Errors: Not applicable                       Elvina Sidle, M.S., CCC-SLP 06/25/2021, 4:31 PM

## 2021-06-25 NOTE — ED Notes (Signed)
Patient transported to MRI 

## 2021-06-25 NOTE — Progress Notes (Addendum)
STROKE TEAM PROGRESS NOTE   ATTENDING NOTE: I reviewed above note and agree with the assessment and plan. Pt was seen and examined.   80 year old female with history of hypertension, hyperlipidemia, TIA in 04/2021, breast cancer status post bilateral mastectomy admitted for episode of right upper extremity weakness, aphasia, slurred speech.  CT no acute abnormality.  MRI left BG/CR infarct, however more scattered distribution.  MRA negative.  EEG negative for seizure.  LDL 91->75, A1c pending, creatinine 0.6.  04/2021 admitted for transient right-sided weakness and aphasia, left gaze and right facial droop.  BP elevated on presentation.  CT, CTA head and neck and MRI negative.  EEG no seizure.  EF 60 to 65%.  LDL 69, A1c 6.0.  Discharged on DAPT for 3 weeks and then aspirin alone.  Continued on Crestor 5.  On exam, patient psychomotor slowing however AOx3.  Mild expressive aphasia with hesitancy of speech, however able to name and repeat without difficulty, follows simple commands.  Cranial nerves intact except right lower facial mild weakness.  Muscle strength symmetric bilaterally except right upper extremity pronator drift and decreased dexterity.  Sensory symmetrical, no ataxia.  Etiology for patient stroke likely due to small vessel disease given location.  However with scattered distribution, cardioembolic source cannot be completely ruled out.  Recommend 30-day cardiac event monitoring as outpatient to rule out A. fib.  She had DAPT in 04/2021 with aspirin and Plavix, will recommend aspirin and Brilinta this time for 30 days and then Plavix alone.  Increase Crestor from 5 to 10.  PT/OT recommend CIR.  For detailed assessment and plan, please refer to above as I have made changes wherever appropriate.   Extensive conversation about traveling for the holidays also took place with family. She should not have air travel for two weeks post stroke based on federal aviation recommendations. We  recommend she sees her primary care provider 1-2 weeks after discharge for clearance to travel after the recommended two week waiting period.   Neurology will sign off. Please call with questions. Pt will follow up with stroke clinic NP at [facility] in about 4 weeks. Thanks for the consult.   Rosalin Hawking, MD PhD Stroke Neurology 06/25/2021 6:10 PM     SUBJECTIVE (INTERVAL HISTORY) Her husband is at the bedside and he is able to provide a comprehensive summary of the events leading up to the stroke symptoms. He states that they were sitting at home and he felt like she was "slower" and "muddled." He also noticed that she seems to be having difficulty moving her right leg, but he thought it was more related to her arthritis.  Patient is resting comfortably in the stretcher. Blood pressure remains controlled. All questions answered.  Ha1C, TSH pending    OBJECTIVE Vitals:   06/25/21 0645 06/25/21 0700 06/25/21 0715 06/25/21 0800  BP: (!) 150/58 (!) 158/66 (!) 141/56 (!) 142/62  Pulse: 73 77 86 74  Resp: 10 13 14 13   Temp:      TempSrc:      SpO2: 100% 98% 97% 97%  Weight:      Height:        CBC:  Recent Labs  Lab 06/24/21 1837 06/24/21 1841  WBC 9.8  --   NEUTROABS 6.1  --   HGB 14.2 15.3*  HCT 41.7 45.0  MCV 89.9  --   PLT 385  --     Basic Metabolic Panel:  Recent Labs  Lab 06/24/21 1837 06/24/21 1841  NA  126* 126*  K 3.5 3.6  CL 91* 91*  CO2 24  --   GLUCOSE 138* 134*  BUN 12 14  CREATININE 0.75 0.60  CALCIUM 10.1  --     Lipid Panel:  Recent Labs  Lab 06/25/21 0508  CHOL 194  TRIG 75  HDL 88  CHOLHDL 2.2  VLDL 15  LDLCALC 91   HgbA1c:  Lab Results  Component Value Date   HGBA1C 6.0 (H) 05/04/2021   Urine Drug Screen:     Component Value Date/Time   LABOPIA NONE DETECTED 05/03/2021 1423   COCAINSCRNUR NONE DETECTED 05/03/2021 1423   LABBENZ NONE DETECTED 05/03/2021 1423   AMPHETMU NONE DETECTED 05/03/2021 1423   THCU NONE DETECTED  05/03/2021 1423   LABBARB NONE DETECTED 05/03/2021 1423    Alcohol Level     Component Value Date/Time   ETH <10 05/03/2021 1234    IMAGING  Results for orders placed or performed during the hospital encounter of 06/24/21  MR BRAIN WO CONTRAST   Narrative   CLINICAL DATA:  Confusion, right facial droop, right-sided weakness, slurred speech, stroke suspected  EXAM: MRI HEAD WITHOUT CONTRAST  MRA HEAD WITHOUT CONTRAST  TECHNIQUE: Multiplanar, multi-echo pulse sequences of the brain and surrounding structures were acquired without intravenous contrast. Angiographic images of the Circle of Willis were acquired using MRA technique without intravenous contrast.  COMPARISON:  05/03/2021 MRI head, correlation is also made with 05/03/2021 CTA head  FINDINGS: MRI HEAD FINDINGS  Brain: Restricted diffusion with ADC correlate in the left basal ganglia (series 2, images 29-32), likely acute infarct. No acute hemorrhage, mass, mass effect, or midline shift. No extra-axial collection or hydrocephalus. Confluent T2 hyperintense signal in the periventricular white matter and pons, likely the sequela of severe chronic small vessel ischemic disease. 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  Vascular: Please see MRA findings below  Skull and upper cervical spine: Normal marrow signal.  Sinuses/Orbits: No acute or significant finding. Status post bilateral lens replacements.  Other: The mastoids are well aerated.  MRA HEAD FINDINGS  Anterior circulation: Both internal carotid arteries are patent to the termini, with mild narrowing in the right supraclinoid segment but without significant stenosis or other abnormality. A1 segments patent. Normal anterior communicating artery. Anterior cerebral arteries are patent to their distal aspects. No M1 stenosis or occlusion. Normal MCA bifurcations. Distal MCA branches perfused  and symmetric.  Posterior circulation: Vertebral arteries patent to the vertebrobasilar junction without stenosis. Basilar patent to its distal aspect. Superior cerebellar arteries patent bilaterally. PCAs perfused to their distal aspects without focal stenosis, although the left proximal P2 is diminutive, likely diffuse narrowing. The bilateral posterior communicating arteries are not visualized.  Anatomic variants: None significant  IMPRESSION: 1. 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 2. No intracranial large vessel occlusion or severe stenosis. Mild diffuse narrowing in the left P2 segment.  These results were called by telephone at the time of interpretation on 06/25/2021 at 2:47 am to provider [facility] , who verbally acknowledged these results.   Electronically Signed   By: Merilyn Baba M.D.   On: 06/25/2021 02:48   MR ANGIO HEAD WO CONTRAST   Narrative   CLINICAL DATA:  Confusion, right facial droop, right-sided weakness, slurred speech, stroke suspected  EXAM: MRI HEAD WITHOUT CONTRAST  MRA HEAD WITHOUT CONTRAST  TECHNIQUE: Multiplanar, multi-echo pulse sequences of the brain and surrounding structures were acquired without intravenous contrast. Angiographic images of  the Circle of Willis were acquired using MRA technique without intravenous contrast.  COMPARISON:  05/03/2021 MRI head, correlation is also made with 05/03/2021 CTA head  FINDINGS: MRI HEAD FINDINGS  Brain: Restricted diffusion with ADC correlate in the left basal ganglia (series 2, images 29-32), likely acute infarct. No acute hemorrhage, mass, mass effect, or midline shift. No extra-axial collection or hydrocephalus. Confluent T2 hyperintense signal in the periventricular white matter and pons, likely the sequela of severe chronic small vessel ischemic disease. 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  Vascular: Please see  MRA findings below  Skull and upper cervical spine: Normal marrow signal.  Sinuses/Orbits: No acute or significant finding. Status post bilateral lens replacements.  Other: The mastoids are well aerated.  MRA HEAD FINDINGS  Anterior circulation: Both internal carotid arteries are patent to the termini, with mild narrowing in the right supraclinoid segment but without significant stenosis or other abnormality. A1 segments patent. Normal anterior communicating artery. Anterior cerebral arteries are patent to their distal aspects. No M1 stenosis or occlusion. Normal MCA bifurcations. Distal MCA branches perfused and symmetric.  Posterior circulation: Vertebral arteries patent to the vertebrobasilar junction without stenosis. Basilar patent to its distal aspect. Superior cerebellar arteries patent bilaterally. PCAs perfused to their distal aspects without focal stenosis, although the left proximal P2 is diminutive, likely diffuse narrowing. The bilateral posterior communicating arteries are not visualized.  Anatomic variants: None significant  IMPRESSION: 1. 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 2. No intracranial large vessel occlusion or severe stenosis. Mild diffuse narrowing in the left P2 segment.  These results were called by telephone at the time of interpretation on 06/25/2021 at 2:47 am to provider [facility] , who verbally acknowledged these results.   Electronically Signed   By: Merilyn Baba M.D.   On: 06/25/2021 02:48        PHYSICAL EXAM  Temp:  [97.9 F (36.6 C)] 97.9 F (36.6 C) (11/27 1915) Pulse Rate:  [72-105] 79 (11/28 1100) Resp:  [10-17] 13 (11/28 1100) BP: (123-165)/(56-115) 137/60 (11/28 1100) SpO2:  [91 %-100 %] 99 % (11/28 1100) Weight:  [73.6 kg] 73.6 kg (11/27 1955)  General - Well nourished, well developed, in no apparent distress.  Ophthalmologic - fundi not visualized due to noncooperation.  Cardiovascular - Regular  rhythm and rate. Normotensive.  Mental Status -  Level of arousal and orientation to time, place, and person were intact. Language including expression, naming, repetition, comprehension was assessed and found intact. Mild dysarthria Attention span and concentration were normal. Recent and remote memory were intact. Fund of Knowledge was assessed and was intact.  Cranial Nerves II - XII - II - Visual field intact OU. III, IV, VI - Extraocular movements intact. V - Facial sensation intact bilaterally. VII - Mild right nasolabial fold drooping. VIII - Hearing & vestibular intact bilaterally. X - Palate elevates symmetrically. XI - Chin turning & shoulder shrug intact bilaterally. XII - Tongue protrusion intact.  Motor Strength - The patient's strength was decreased in all extremities.  Bulk was normal and fasciculations were absent. Mild right pronator drift. Fine motor impairment right upper extremity. No ataxia in relation to weakness.  RUE 3/5 RLE 3/5 LUE 4/5 LLE 4/5  Motor Tone - Muscle tone was assessed at the neck and appendages and was normal.  Reflexes - The patient's reflexes were symmetrical in all extremities and she had no pathological reflexes.  Sensory - Light touch, temperature/pinprick were  assessed and were symmetrical.    Coordination - The patient had normal movements in the hands and feet with no ataxia or dysmetria.  Tremor was absent.  Gait and Station - deferred.  ASSESSMENT/PLAN Ms. BRITLEE SKOLNIK is a 80 y.o. female with history of hyperlipidemia, hypertension, TIA, glaucoma, and breast cancer presenting with with right upper extremity weakness and difficulty word finding and slurring of her speech.  Stroke:  left BG/CR infarct likely secondary to small vessel disease given location, however, can not completely rule out cardioembolic source due to scattered stroke distribution CT head - Redemonstrated chronic lacunar infarcts within the bilateral deep  gray nuclei and right cerebellar hemisphere.  MRI head - Acute infarct in the left basal ganglia. No intracranial large vessel occlusion or severe stenosis.  MRA head Mild diffuse narrowing in the left P2 segment. 2D Echo  EF 60-65%, normal ventricular function.  Aortic valve is mild-moderately calcified EEG- within normal limits. No seizures or epileptiform discharges were seen throughout the recording. Recommend 30 day cardiac monitor as outpt to rule out afib. LDL 69 HgbA1c 6.0 SCDs for VTE prophylaxis aspirin 81 mg daily prior to admission, now on aspirin 81 mg daily and Brilinta (ticagrelor) 90 mg bid for 30 days and then continue plavix alone. Patient counseled to be compliant with her antithrombotic medications Ongoing aggressive stroke risk factor management Therapy recommendations:  CIR  Disposition:  pending  History of TIA 04/2021 admitted for transient right-sided weakness and aphasia, left gaze and right facial droop.  BP elevated on presentation.  CT, CTA head and neck and MRI negative.  EEG no seizure.  EF 60 to 65%.  LDL 69, A1c 6.0.  Discharged on DAPT for 3 weeks and then aspirin alone.  Continued on Crestor 5.  Hypertension Stable Long-term BP goal normotensive  Hyperlipidemia Home meds:  crestor 5 LDL 69, goal < 70 Increase Crestor to 10mg  Continue statin at discharge  Other Stroke Risk Factors Advanced age Former Cigarette smoker - quit in 1965 ETOH use, advised to drink no more than 1/2 drink(s) a day Coronary artery disease   Other Active Problems History of breast cancer status post bilateral mastectomy Hyponatremia Continue to monitor for slow correction- 127 Electrolytes replaced as needed. Repeat labs ordered for by primary team.   Extensive conversation about traveling for the holidays also took place with family. She should not have air travel for two weeks post stroke based on federal aviation recommendations. We recommend she sees her primary  care provider 1-2 weeks after discharge for clearance to travel after the recommended two week waiting period. She should then follow up with [facility] per discharge protocol.   Hospital day # 0  Pt seen by NP/Neuro and later by MD. Note/plan to be edited by MD as needed.  Janine Ores, FNP-BC [facility]   To contact Stroke Continuity provider, please refer to [email or web address] After hours, contact General Neurology

## 2021-06-25 NOTE — ED Notes (Signed)
Report given to Gillis Santa, RN of [facility]

## 2021-06-26 DIAGNOSIS — G459 Transient cerebral ischemic attack, unspecified: Secondary | ICD-10-CM | POA: Diagnosis not present

## 2021-06-26 DIAGNOSIS — R29810 Facial weakness: Secondary | ICD-10-CM | POA: Diagnosis present

## 2021-06-26 DIAGNOSIS — R7303 Prediabetes: Secondary | ICD-10-CM | POA: Diagnosis present

## 2021-06-26 DIAGNOSIS — Z853 Personal history of malignant neoplasm of breast: Secondary | ICD-10-CM | POA: Diagnosis not present

## 2021-06-26 DIAGNOSIS — I6381 Other cerebral infarction due to occlusion or stenosis of small artery: Secondary | ICD-10-CM | POA: Diagnosis present

## 2021-06-26 DIAGNOSIS — M170 Bilateral primary osteoarthritis of knee: Secondary | ICD-10-CM | POA: Diagnosis present

## 2021-06-26 DIAGNOSIS — I10 Essential (primary) hypertension: Secondary | ICD-10-CM | POA: Diagnosis not present

## 2021-06-26 DIAGNOSIS — G8191 Hemiplegia, unspecified affecting right dominant side: Secondary | ICD-10-CM | POA: Diagnosis present

## 2021-06-26 DIAGNOSIS — R2689 Other abnormalities of gait and mobility: Secondary | ICD-10-CM | POA: Diagnosis not present

## 2021-06-26 DIAGNOSIS — H409 Unspecified glaucoma: Secondary | ICD-10-CM | POA: Diagnosis present

## 2021-06-26 DIAGNOSIS — Z79899 Other long term (current) drug therapy: Secondary | ICD-10-CM | POA: Diagnosis not present

## 2021-06-26 DIAGNOSIS — R569 Unspecified convulsions: Secondary | ICD-10-CM | POA: Diagnosis present

## 2021-06-26 DIAGNOSIS — Z7982 Long term (current) use of aspirin: Secondary | ICD-10-CM | POA: Diagnosis not present

## 2021-06-26 DIAGNOSIS — Z888 Allergy status to other drugs, medicaments and biological substances status: Secondary | ICD-10-CM | POA: Diagnosis not present

## 2021-06-26 DIAGNOSIS — R29700 NIHSS score 0: Secondary | ICD-10-CM | POA: Diagnosis present

## 2021-06-26 DIAGNOSIS — R4701 Aphasia: Secondary | ICD-10-CM | POA: Diagnosis present

## 2021-06-26 DIAGNOSIS — I69319 Unspecified symptoms and signs involving cognitive functions following cerebral infarction: Secondary | ICD-10-CM | POA: Diagnosis not present

## 2021-06-26 DIAGNOSIS — Z20822 Contact with and (suspected) exposure to covid-19: Secondary | ICD-10-CM | POA: Diagnosis present

## 2021-06-26 DIAGNOSIS — Z8673 Personal history of transient ischemic attack (TIA), and cerebral infarction without residual deficits: Secondary | ICD-10-CM | POA: Diagnosis not present

## 2021-06-26 DIAGNOSIS — I69351 Hemiplegia and hemiparesis following cerebral infarction affecting right dominant side: Secondary | ICD-10-CM | POA: Diagnosis not present

## 2021-06-26 DIAGNOSIS — I69398 Other sequelae of cerebral infarction: Secondary | ICD-10-CM | POA: Diagnosis not present

## 2021-06-26 DIAGNOSIS — E7800 Pure hypercholesterolemia, unspecified: Secondary | ICD-10-CM | POA: Diagnosis not present

## 2021-06-26 DIAGNOSIS — I69320 Aphasia following cerebral infarction: Secondary | ICD-10-CM | POA: Diagnosis not present

## 2021-06-26 DIAGNOSIS — I639 Cerebral infarction, unspecified: Secondary | ICD-10-CM | POA: Diagnosis not present

## 2021-06-26 DIAGNOSIS — Z9013 Acquired absence of bilateral breasts and nipples: Secondary | ICD-10-CM | POA: Diagnosis not present

## 2021-06-26 DIAGNOSIS — E785 Hyperlipidemia, unspecified: Secondary | ICD-10-CM | POA: Diagnosis present

## 2021-06-26 DIAGNOSIS — Z87891 Personal history of nicotine dependence: Secondary | ICD-10-CM | POA: Diagnosis not present

## 2021-06-26 DIAGNOSIS — Z91013 Allergy to seafood: Secondary | ICD-10-CM | POA: Diagnosis not present

## 2021-06-26 DIAGNOSIS — E871 Hypo-osmolality and hyponatremia: Secondary | ICD-10-CM | POA: Diagnosis not present

## 2021-06-26 LAB — CBC WITH DIFFERENTIAL/PLATELET
Abs Immature Granulocytes: 0.02 10*3/uL (ref 0.00–0.07)
Basophils Absolute: 0.1 10*3/uL (ref 0.0–0.1)
Basophils Relative: 1 %
Eosinophils Absolute: 0.2 10*3/uL (ref 0.0–0.5)
Eosinophils Relative: 2 %
HCT: 37.6 % (ref 36.0–46.0)
Hemoglobin: 12.7 g/dL (ref 12.0–15.0)
Immature Granulocytes: 0 %
Lymphocytes Relative: 26 %
Lymphs Abs: 2.1 10*3/uL (ref 0.7–4.0)
MCH: 29.5 pg (ref 26.0–34.0)
MCHC: 33.8 g/dL (ref 30.0–36.0)
MCV: 87.4 fL (ref 80.0–100.0)
Monocytes Absolute: 0.9 10*3/uL (ref 0.1–1.0)
Monocytes Relative: 11 %
Neutro Abs: 4.8 10*3/uL (ref 1.7–7.7)
Neutrophils Relative %: 60 %
Platelets: 363 10*3/uL (ref 150–400)
RBC: 4.3 MIL/uL (ref 3.87–5.11)
RDW: 13.3 % (ref 11.5–15.5)
WBC: 8 10*3/uL (ref 4.0–10.5)
nRBC: 0 % (ref 0.0–0.2)

## 2021-06-26 LAB — COMPREHENSIVE METABOLIC PANEL
ALT: 53 U/L — ABNORMAL HIGH (ref 0–44)
AST: 34 U/L (ref 15–41)
Albumin: 3.6 g/dL (ref 3.5–5.0)
Alkaline Phosphatase: 45 U/L (ref 38–126)
Anion gap: 7 (ref 5–15)
BUN: 11 mg/dL (ref 8–23)
CO2: 25 mmol/L (ref 22–32)
Calcium: 8.9 mg/dL (ref 8.9–10.3)
Chloride: 99 mmol/L (ref 98–111)
Creatinine, Ser: 0.53 mg/dL (ref 0.44–1.00)
GFR, Estimated: >60 mL/min (ref >60)
Glucose, Bld: 113 mg/dL — ABNORMAL HIGH (ref 70–99)
Potassium: 3.6 mmol/L (ref 3.5–5.1)
Sodium: 131 mmol/L — ABNORMAL LOW (ref 135–145)
Total Bilirubin: 0.7 mg/dL (ref 0.3–1.2)
Total Protein: 6.2 g/dL — ABNORMAL LOW (ref 6.5–8.1)

## 2021-06-26 LAB — HEMOGLOBIN A1C
Hgb A1c MFr Bld: 5.7 % — ABNORMAL HIGH (ref 4.8–5.6)
Mean Plasma Glucose: 117 mg/dL

## 2021-06-26 LAB — PHOSPHORUS: Phosphorus: 3 mg/dL (ref 2.5–4.6)

## 2021-06-26 LAB — MAGNESIUM: Magnesium: 1.8 mg/dL (ref 1.7–2.4)

## 2021-06-26 MED ORDER — CHLORTHALIDONE 25 MG PO TABS: 25.0000 mg | ORAL_TABLET | Freq: Every day | ORAL | Status: DC

## 2021-06-26 MED ORDER — SODIUM CHLORIDE 0.9 % IV SOLN: INTRAVENOUS | Status: DC

## 2021-06-26 MED ORDER — CHLORTHALIDONE 25 MG PO TABS
ORAL_TABLET | ORAL | Status: AC
  Administered 2021-06-26: 25 mg via ORAL
  Filled 2021-06-26: qty 1

## 2021-06-26 NOTE — Plan of Care (Signed)
Pt is alert oriented x 4. Pt has right sided weakness, ambulates to bathroom with +1 assist with walker. Pt has physical therapy working with her. No distress noted. IV fluids continued per order.    Problem: Education: Goal: Knowledge of General Education information will improve Description: Including pain rating scale, medication(s)/side effects and non-pharmacologic comfort measures Outcome: Progressing   Problem: Health Behavior/Discharge Planning: Goal: Ability to manage health-related needs will improve Outcome: Progressing   Problem: Clinical Measurements: Goal: Ability to maintain clinical measurements within normal limits will improve Outcome: Progressing Goal: Will remain free from infection Outcome: Progressing Goal: Diagnostic test results will improve Outcome: Progressing Goal: Respiratory complications will improve Outcome: Progressing Goal: Cardiovascular complication will be avoided Outcome: Progressing   Problem: Activity: Goal: Risk for activity intolerance will decrease Outcome: Progressing   Problem: Nutrition: Goal: Adequate nutrition will be maintained Outcome: Progressing   Problem: Coping: Goal: Level of anxiety will decrease Outcome: Progressing   Problem: Elimination: Goal: Will not experience complications related to bowel motility Outcome: Progressing Goal: Will not experience complications related to urinary retention Outcome: Progressing   Problem: Pain Managment: Goal: General experience of comfort will improve Outcome: Progressing   Problem: Safety: Goal: Ability to remain free from injury will improve Outcome: Progressing   Problem: Safety: Goal: Ability to remain free from injury will improve Outcome: Progressing   Problem: Skin Integrity: Goal: Risk for impaired skin integrity will decrease Outcome: Progressing

## 2021-06-26 NOTE — Plan of Care (Signed)

## 2021-06-26 NOTE — Plan of Care (Signed)
Pt is alert oriented x 4. P +1 assist to bathroom. Pt has IV fluids continued per order. Denies pain. Pt has right sided weakness and right facial droop.   Problem: Education: Goal: Knowledge of General Education information will improve Description: Including pain rating scale, medication(s)/side effects and non-pharmacologic comfort measures Outcome: Progressing   Problem: Health Behavior/Discharge Planning: Goal: Ability to manage health-related needs will improve Outcome: Progressing   Problem: Clinical Measurements: Goal: Ability to maintain clinical measurements within normal limits will improve Outcome: Progressing Goal: Will remain free from infection Outcome: Progressing Goal: Diagnostic test results will improve Outcome: Progressing Goal: Respiratory complications will improve Outcome: Progressing Goal: Cardiovascular complication will be avoided Outcome: Progressing   Problem: Activity: Goal: Risk for activity intolerance will decrease Outcome: Progressing   Problem: Nutrition: Goal: Adequate nutrition will be maintained Outcome: Progressing   Problem: Coping: Goal: Level of anxiety will decrease Outcome: Progressing   Problem: Elimination: Goal: Will not experience complications related to bowel motility Outcome: Progressing Goal: Will not experience complications related to urinary retention Outcome: Progressing   Problem: Pain Managment: Goal: General experience of comfort will improve Outcome: Progressing   Problem: Safety: Goal: Ability to remain free from injury will improve Outcome: Progressing   Problem: Skin Integrity: Goal: Risk for impaired skin integrity will decrease Outcome: Progressing

## 2021-06-26 NOTE — Progress Notes (Signed)
Inpatient Rehabilitation Admissions Coordinator   I met at bedside with patient and her spouse.We discussed goals and expecations of a possible CIR admit and benefits of CIR level rehab rather than SNF level at [facility]. They are in agreement. I will be able to clarify more tomorrow if I will have a possible CIR bed available this week for her admit. I will follow up tomorrow. Dr Sherral Hammers updated at bedside also.  Danne Baxter, RN, MSN Rehab Admissions Coordinator [phone] 06/26/2021 3:58 PM

## 2021-06-26 NOTE — Progress Notes (Signed)
Physical Therapy Treatment Patient Details Name: Sandra Becker MRN: [id number] DOB: [birth year] Today's Date: 06/26/2021   History of Present Illness Pt is an 80 y/o female admitted 11/27 secondary to speech difficulty and R sided weakness. 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 PMH includes breast cancer s/p bilateral mastectomy, glaucoma, HTN, and TIA.    PT Comments    Pt received in supine, oriented and agreeable to therapy session, with good participation and tolerance for hallway gait progression using RW. Pt with slow processing and decreased attention to R side but able to follow instructions for R gaze/wayfinding tasks during ambulation with increased time and cues. Pt unable to list signs/symptoms of stroke when prompted so increased time/emphasis on this during session. Pt needing up to minA for physical assist with bed mobility and gait progression using AD but need frequent safety/technique cues throughout. Pt continues to benefit from PT services to progress toward functional mobility goals and continue to recommend AIR; pt reports 5/10 RPE, moderate fatigue at end of session.  Recommendations for follow up therapy are one component of a multi-disciplinary discharge planning process, led by the attending physician.  Recommendations may be updated based on patient status, additional functional criteria and insurance authorization.  Follow Up Recommendations  Acute inpatient rehab (3hours/day)     Assistance Recommended at Discharge Frequent or constant Supervision/Assistance  Equipment Recommendations  Other (comment) (RW, per eval pt has access to this through IL[facility]?)    Recommendations for Other Services       Precautions / Restrictions Precautions Precautions: Fall Precaution Comments: decreased R awareness Restrictions Weight Bearing Restrictions: No     Mobility  Bed Mobility Overal bed mobility: Needs Assistance Bed Mobility: Supine to Sit;Sit to Supine      Supine to sit: Min guard Sit to supine: Min assist   General bed mobility comments: Required multimodal cues and RLE assist for return to supine, pt using bed rail for transition to upright. Increased time required to scoot to EOB and HOB partially elevated    Transfers Overall transfer level: Needs assistance Equipment used: Rolling walker (2 wheels) Transfers: Sit to/from Stand Sit to Stand: Min assist           General transfer comment: from EOB<>RW, pt needs cues each attempt to push from bed/not to keep hands on RW, decreased eccentric control to sit needs minA to prevent plopping down.    Ambulation/Gait Ambulation/Gait assistance: Min guard;Min assist Gait Distance (Feet): 90 Feet (including 2 longer standing breaks) Assistive device: Rolling walker (2 wheels) Gait Pattern/deviations: Step-through pattern;Decreased stride length;Decreased dorsiflexion - right;Drifts right/left       General Gait Details: Pt needs cues to remain within RW while turning and for proper RUE placement on RW handle. Pt tending to drift toward R side at times needs cues for R attention/wayfinding. pt initially states her room number as "11" but when cued to look toward R side of room entryway, able to see room # as "7". up to minA for turning balance and RW proximity.    Modified Rankin (Stroke Patients Only) Modified Rankin (Stroke Patients Only) Pre-Morbid Rankin Score: No significant disability Modified Rankin: Moderately severe disability     Balance Overall balance assessment: Needs assistance Sitting-balance support: Feet supported;No upper extremity supported Sitting balance-Leahy Scale: Fair     Standing balance support: Single extremity supported;During functional activity Standing balance-Leahy Scale: Poor Standing balance comment: Reliant on at least 1 UE support but does better with BUE on RW  for dynamic tasks            Cognition Arousal/Alertness:  Awake/alert Behavior During Therapy: Flat affect Overall Cognitive Status: Impaired/Different from baseline Area of Impairment: Problem solving;Following commands;Safety/judgement;Attention;Awareness       Following Commands: Follows one step commands with increased time Safety/Judgement: Decreased awareness of deficits;Decreased awareness of safety Awareness: Emergent Problem Solving: Slow processing;Difficulty sequencing;Requires verbal cues General Comments: Pt with decreased R awareness and noted short term memory deficit, oriented to self/place/time but very slow processing with orientation questions and with some word finding difficulty. Pt needs cues to look to R for obstacles, may need more in depth visual assessment.        Exercises Other Exercises Other Exercises: verbal/visual demo for SAQ, ankle pumps, hip abduction encouraged pt to perform throughout day    General Comments General comments (skin integrity, edema, etc.): HR 80's-90's per tele, no dyspnea; Reviewed s/sx of stroke per booklet and wrote down "BE FAST" acronym, pt unable to verbalize any specific symptoms initially when prompted, just stated "physical symptoms" when asked her to list them.      Pertinent Vitals/Pain Pain Assessment: No/denies pain     PT Goals (current goals can now be found in the care plan section) Acute Rehab PT Goals PT Goal Formulation: With patient/family Time For Goal Achievement: 07/09/21 Progress towards PT goals: Progressing toward goals    Frequency    Min 4X/week      PT Plan Current plan remains appropriate    AM-PAC PT "6 Clicks" Mobility   Outcome Measure  Help needed turning from your back to your side while in a flat bed without using bedrails?: A Little Help needed moving from lying on your back to sitting on the side of a flat bed without using bedrails?: A Little Help needed moving to and from a bed to a chair (including a wheelchair)?: A Lot (mod cues  for this and all items below) Help needed standing up from a chair using your arms (e.g., wheelchair or bedside chair)?: A Lot Help needed to walk in hospital room?: A Lot Help needed climbing 3-5 steps with a railing? : A Lot 6 Click Score: 14    End of Session Equipment Utilized During Treatment: Gait belt Activity Tolerance: Patient tolerated treatment well Patient left: in bed;with call bell/phone within reach;with bed alarm set;with SCD's reapplied (bed in chair position) Nurse Communication: Mobility status;Other (comment) (safer technique for sit>stand) PT Visit Diagnosis: Unsteadiness on feet (R26.81);Muscle weakness (generalized) (M62.81);Other symptoms and signs involving the nervous system (R29.898)     Time: 3546-5681 PT Time Calculation (min) (ACUTE ONLY): 26 min  Charges:  $Gait Training: 8-22 mins $Therapeutic Activity: 8-22 mins                     Dea Bitting P., PTA Acute Rehabilitation Services Pager: [phone] Office: [place] 06/26/2021, 1:10 PM

## 2021-06-26 NOTE — Progress Notes (Signed)
PROGRESS NOTE    Sandra Becker  ALP:[id number] DOB: [birth year] DOA: 06/24/2021 PCP: Kelton Pillar, MD   Brief Narrative:  80 y.o. WF PMHx HTN, glaucoma, TIA, HLD, breast cancer s/p bilateral mastectomy   Presents for dilation of sudden onset of slurred speech with right-sided weakness.  She reports that she was sitting at home with her husband when she developed slurred speech that was difficult for her to get the words out and she had some right-sided weakness.  Husband called 37.  Symptoms resolved while in the ambulance about 30 minutes after they started.  She denies having any headache, chest pain, palpitations, nausea vomiting diarrhea or abdominal pain.  She was not doing any type of exertion when the symptoms began.  She has not had any change in her diet.  He denies any swelling of her legs.  She been taking her medications as prescribed.  Symptoms are completely resolved at this time.  She denies any visual change with the symptoms. Lives with her husband.  No tobacco, alcohol, illicit drug use   ED Course: [name] has been hemodynamically stable in the emergency room.  Speech is back to baseline.  She is able to move all extremities and has no numbness in extremities.  CT of the head was negative for acute pathology.  Sodium 126 which is her baseline level.  Potassium 3.6 chloride 91 bicarb 24 creatinine 0.75 BUN 12 glucose 138 AST 41 ALT 63 alkaline phosphatase 53.  CBC unremarkable.  INR 0.9.    Subjective: 11/29 A/O x4, no complaints.   Assessment & Plan:  Covid vaccination;  Principal Problem:   TIA (transient ischemic attack) Active Problems:   Hyponatremia   Essential hypertension   Infarction of left basal ganglia (HCC)   Stroke (HCC)   Acute infarct in the LEFT basal ganglia. -TSH 1.1 WNL  - Lipid panel pending - CMP, CBC, magnesium, phosphorus pending. - Allow permissive hypertension <220/110, BP to be titrated down to normal over the next 48 to 72  hours. - DAPT  - Brilinta 90 mg BID  -Aspirin 81 mg - Crestor 10 mg daily : Depended upon LDL findings may need to increase - PT/OT consult: Recommend CIR. -11/29 CIR may have a bed for patient in the a.m.  Prediabetes -11/28 Hemoglobin A1c 5.7 - Patient falls into the prediabetes range (5.7-6.4) - Have patient discuss with her PCP diet modification vs starting metformin low-dose given her stroke.  Essential HTN - Patient with acute stroke will allow permissive HTN -11/29 Chlorthalidone 25 mg  - Irbesartan 300 mg daily (hold)  -Normalized patient's BP over the next 48 hours  Hyponatremia - Normal saline 75ml/hr - Resolving   Hyponatremia   DVT prophylaxis: DAPT Code Status: Full Family Communication: 11/29 husband at bedside for discussion of plan of care all questions answered Status is: Inpatient    Dispo: The patient is from: Home              Anticipated d/c is to: CIR              anticipated d/c date is: 3 days              Patient currently is not medically stable to d/c.      Consultants:  Neurology stroke team   Procedures/Significant Events:  11/28 MRI brain Wo contrast, MRA head -Acute infarct in the left basal ganglia. 2. No intracranial large vessel occlusion or severe stenosis. Mild diffuse narrowing in  the left P2 segment  I have personally reviewed and interpreted all radiology studies and my findings are as above.  VENTILATOR SETTINGS:    Cultures   Antimicrobials:    Devices    LINES / TUBES:      Continuous Infusions:  sodium chloride Stopped (06/25/21 0421)     Objective: Vitals:   06/25/21 2326 06/26/21 0359 06/26/21 0725 06/26/21 1128  BP: (!) 115/53 (!) 161/67 (!) 168/72 (!) 173/75  Pulse: 82  78 80  Resp: 16  16 16   Temp: 98.2 F (36.8 C) 97.8 F (36.6 C) 98.1 F (36.7 C) 98 F (36.7 C)  TempSrc: Oral  Oral Oral  SpO2: 95% 100% 99% 99%  Weight:      Height:        Intake/Output Summary (Last 24  hours) at 06/26/2021 1626 Last data filed at 06/25/2021 2245 Gross per 24 hour  Intake 120 ml  Output --  Net 120 ml    Filed Weights   06/24/21 1800 06/24/21 1955  Weight: 73.6 kg 73.6 kg    Examination:  General: A/O x4, No acute respiratory distress Eyes: negative scleral hemorrhage, negative anisocoria, negative icterus ENT: Negative Runny nose, negative gingival bleeding, Neck:  Negative scars, masses, torticollis, lymphadenopathy, JVD Lungs: Clear to auscultation bilaterally without wheezes or crackles Cardiovascular: Regular rate and rhythm without murmur gallop or rub normal S1 and S2 Abdomen: negative abdominal pain, nondistended, positive soft, bowel sounds, no rebound, no ascites, no appreciable mass Extremities: No significant cyanosis, clubbing, or edema bilateral lower extremities Skin: Negative rashes, lesions, ulcers Psychiatric:  Negative depression, negative anxiety, negative fatigue, negative mania  Central nervous system:  Cranial nerves II through XII intact, tongue/uvula midline, all extremities muscle strength 5/5, sensation intact throughout, finger nose finger bilateral within normal limits, quick finger touch bilateral within normal limits, heel to shin bilateral within normal limits, mild left facial droop, mild short-term memory loss, negative dysarthria, negative expressive aphasia, negative receptive aphasia.  .     Data Reviewed: Care during the described time interval was provided by me .  I have reviewed this patient's available data, including medical history, events of note, physical examination, and all test results as part of my evaluation.   CBC: Recent Labs  Lab 06/24/21 1837 06/24/21 1841 06/25/21 0739 06/25/21 1403 06/26/21 0416  WBC 9.8  --  9.1 9.3 8.0  NEUTROABS 6.1  --  5.5 5.5 4.8  HGB 14.2 15.3* 14.4 14.0 12.7  HCT 41.7 45.0 43.5 40.9 37.6  MCV 89.9  --  90.6 89.1 87.4  PLT 385  --  367 405* 735    Basic Metabolic  Panel: Recent Labs  Lab 06/24/21 1837 06/24/21 1841 06/25/21 0739 06/25/21 1403 06/26/21 0416  NA 126* 126* 127* 130* 131*  K 3.5 3.6 3.8 4.2 3.6  CL 91* 91* 92* 93* 99  CO2 24  --  24 23 25   GLUCOSE 138* 134* 115* 114* 113*  BUN 12 14 11 12 11   CREATININE 0.75 0.60 0.52 0.62 0.53  CALCIUM 10.1  --  9.5 9.6 8.9  MG  --   --  1.8 1.8 1.8  PHOS  --   --  4.4 4.3 3.0    GFR: Estimated Creatinine Clearance: 56.3 mL/min (by C-G formula based on SCr of 0.53 mg/dL). Liver Function Tests: Recent Labs  Lab 06/24/21 1837 06/25/21 0739 06/25/21 1403 06/26/21 0416  AST 41 38 39 34  ALT 63* 58* 62* 53*  ALKPHOS 53 50 52 45  BILITOT 0.7 0.7 0.8 0.7  PROT 7.0 6.6 6.8 6.2*  ALBUMIN 4.1 3.9 4.2 3.6    No results for input(s): LIPASE, AMYLASE in the last 168 hours. No results for input(s): AMMONIA in the last 168 hours. Coagulation Profile: Recent Labs  Lab 06/24/21 1837  INR 0.9    Cardiac Enzymes: No results for input(s): CKTOTAL, CKMB, CKMBINDEX, TROPONINI in the last 168 hours. BNP (last 3 results) No results for input(s): PROBNP in the last 8760 hours. HbA1C: Recent Labs    06/25/21 1403  HGBA1C 5.7*   CBG: Recent Labs  Lab 06/24/21 1835  GLUCAP 139*    Lipid Profile: Recent Labs    06/25/21 0508 06/25/21 1403  CHOL 194 183  HDL 88 87  LDLCALC 91 75  TRIG 75 104  CHOLHDL 2.2 2.1    Thyroid Function Tests: Recent Labs    06/25/21 1403  TSH 1.117    Anemia Panel: No results for input(s): VITAMINB12, FOLATE, FERRITIN, TIBC, IRON, RETICCTPCT in the last 72 hours. Urine analysis:    Component Value Date/Time   COLORURINE STRAW (A) 05/03/2021 1423   APPEARANCEUR CLEAR 05/03/2021 1423   LABSPEC 1.016 05/03/2021 1423   PHURINE 8.0 05/03/2021 1423   GLUCOSEU NEGATIVE 05/03/2021 1423   HGBUR NEGATIVE 05/03/2021 [place] 05/03/2021 1423   KETONESUR NEGATIVE 05/03/2021 1423   PROTEINUR NEGATIVE 05/03/2021 1423   UROBILINOGEN  0.2 04/27/2011 2131   NITRITE NEGATIVE 05/03/2021 1423   LEUKOCYTESUR NEGATIVE 05/03/2021 1423   Sepsis Labs: @LABRCNTIP (procalcitonin:4,lacticidven:4)  ) Recent Results (from the past 240 hour(s))  Resp Panel by RT-PCR (Flu A&B, Covid) Nasopharyngeal Swab     Status: None   Collection Time: 06/24/21 11:48 PM   Specimen: Nasopharyngeal Swab; Nasopharyngeal(NP) swabs in vial transport medium  Result Value Ref Range Status   SARS Coronavirus 2 by RT PCR NEGATIVE NEGATIVE Final    Comment: (NOTE) SARS-CoV-2 target nucleic acids are NOT DETECTED.  The SARS-CoV-2 RNA is generally detectable in upper respiratory specimens during the acute phase of infection. The lowest concentration of SARS-CoV-2 viral copies this assay can detect is 138 copies/mL. A negative result does not preclude SARS-Cov-2 infection and should not be used as the sole basis for treatment or other patient management decisions. A negative result may occur with  improper specimen collection/handling, submission of specimen other than nasopharyngeal swab, presence of viral mutation(s) within the areas targeted by this assay, and inadequate number of viral copies(<138 copies/mL). A negative result must be combined with clinical observations, patient history, and epidemiological information. The expected result is Negative.  Fact Sheet for Patients:  EntrepreneurPulse.com.au  Fact Sheet for Healthcare Providers:  IncredibleEmployment.be  This test is no t yet approved or cleared by the [place] FDA and  has been authorized for detection and/or diagnosis of SARS-CoV-2 by FDA under an Emergency Use Authorization (EUA). This EUA will remain  in effect (meaning this test can be used) for the duration of the COVID-19 declaration under Section 564(b)(1) of the Act, 21 U.S.C.section 360bbb-3(b)(1), unless the authorization is terminated  or revoked sooner.       Influenza A by  PCR NEGATIVE NEGATIVE Final   Influenza B by PCR NEGATIVE NEGATIVE Final    Comment: (NOTE) The Xpert Xpress SARS-CoV-2/FLU/RSV plus assay is intended as an aid in the diagnosis of influenza from Nasopharyngeal swab specimens and should not be used as a sole basis for treatment. Nasal washings and  aspirates are unacceptable for Xpert Xpress SARS-CoV-2/FLU/RSV testing.  Fact Sheet for Patients: EntrepreneurPulse.com.au  Fact Sheet for Healthcare Providers: IncredibleEmployment.be  This test is not yet approved or cleared by the [place] FDA and has been authorized for detection and/or diagnosis of SARS-CoV-2 by FDA under an Emergency Use Authorization (EUA). This EUA will remain in effect (meaning this test can be used) for the duration of the COVID-19 declaration under Section 564(b)(1) of the Act, 21 U.S.C. section 360bbb-3(b)(1), unless the authorization is terminated or revoked.  Performed at [facility], [place] [address], [place], [place] [address]          Radiology Studies: MR ANGIO HEAD WO CONTRAST  Result Date: 06/25/2021 CLINICAL DATA:  Confusion, right facial droop, right-sided weakness, slurred speech, stroke suspected EXAM: MRI HEAD WITHOUT CONTRAST MRA HEAD WITHOUT CONTRAST TECHNIQUE: Multiplanar, multi-echo pulse sequences of the brain and surrounding structures were acquired without intravenous contrast. Angiographic images of the Circle of Willis were acquired using MRA technique without intravenous contrast. COMPARISON:  05/03/2021 MRI head, correlation is also made with 05/03/2021 CTA head FINDINGS: MRI HEAD FINDINGS Brain: Restricted diffusion with ADC correlate in the left basal ganglia (series 2, images 29-32), likely acute infarct. No acute hemorrhage, mass, mass effect, or midline shift. No extra-axial collection or hydrocephalus. Confluent T2 hyperintense signal in the periventricular white matter and pons,  likely the sequela of severe chronic small vessel ischemic disease. 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 Vascular: Please see MRA findings below Skull and upper cervical spine: Normal marrow signal. Sinuses/Orbits: No acute or significant finding. Status post bilateral lens replacements. Other: The mastoids are well aerated. MRA HEAD FINDINGS Anterior circulation: Both internal carotid arteries are patent to the termini, with mild narrowing in the right supraclinoid segment but without significant stenosis or other abnormality. A1 segments patent. Normal anterior communicating artery. Anterior cerebral arteries are patent to their distal aspects. No M1 stenosis or occlusion. Normal MCA bifurcations. Distal MCA branches perfused and symmetric. Posterior circulation: Vertebral arteries patent to the vertebrobasilar junction without stenosis. Basilar patent to its distal aspect. Superior cerebellar arteries patent bilaterally. PCAs perfused to their distal aspects without focal stenosis, although the left proximal P2 is diminutive, likely diffuse narrowing. The bilateral posterior communicating arteries are not visualized. Anatomic variants: None significant IMPRESSION: 1. 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 2. No intracranial large vessel occlusion or severe stenosis. Mild diffuse narrowing in the left P2 segment. These results were called by telephone at the time of interpretation on 06/25/2021 at 2:47 am to provider [facility] , who verbally acknowledged these results. Electronically Signed   By: Merilyn Baba M.D.   On: 06/25/2021 02:48   MR BRAIN WO CONTRAST  Result Date: 06/25/2021 CLINICAL DATA:  Confusion, right facial droop, right-sided weakness, slurred speech, stroke suspected EXAM: MRI HEAD WITHOUT CONTRAST MRA HEAD WITHOUT CONTRAST TECHNIQUE: Multiplanar, multi-echo pulse sequences of the brain and surrounding structures were acquired  without intravenous contrast. Angiographic images of the Circle of Willis were acquired using MRA technique without intravenous contrast. COMPARISON:  05/03/2021 MRI head, correlation is also made with 05/03/2021 CTA head FINDINGS: MRI HEAD FINDINGS Brain: Restricted diffusion with ADC correlate in the left basal ganglia (series 2, images 29-32), likely acute infarct. No acute hemorrhage, mass, mass effect, or midline shift. No extra-axial collection or hydrocephalus. Confluent T2 hyperintense signal in the periventricular white matter and pons, likely the sequela of severe chronic small  vessel ischemic disease. 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 Vascular: Please see MRA findings below Skull and upper cervical spine: Normal marrow signal. Sinuses/Orbits: No acute or significant finding. Status post bilateral lens replacements. Other: The mastoids are well aerated. MRA HEAD FINDINGS Anterior circulation: Both internal carotid arteries are patent to the termini, with mild narrowing in the right supraclinoid segment but without significant stenosis or other abnormality. A1 segments patent. Normal anterior communicating artery. Anterior cerebral arteries are patent to their distal aspects. No M1 stenosis or occlusion. Normal MCA bifurcations. Distal MCA branches perfused and symmetric. Posterior circulation: Vertebral arteries patent to the vertebrobasilar junction without stenosis. Basilar patent to its distal aspect. Superior cerebellar arteries patent bilaterally. PCAs perfused to their distal aspects without focal stenosis, although the left proximal P2 is diminutive, likely diffuse narrowing. The bilateral posterior communicating arteries are not visualized. Anatomic variants: None significant IMPRESSION: 1. 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 2. No intracranial large vessel occlusion or severe stenosis. Mild diffuse narrowing in the left P2 segment.  These results were called by telephone at the time of interpretation on 06/25/2021 at 2:47 am to provider [facility] , who verbally acknowledged these results. Electronically Signed   By: Merilyn Baba M.D.   On: 06/25/2021 02:48   EEG adult  Result Date: 06/25/2021 Lora Havens, MD     06/25/2021 12:56 PM Patient Name: Sandra Becker MRN: [id number] Epilepsy Attending: Lora Havens Referring Physician/Provider: Dr Donnetta Simpers Date: 06/25/2021 Duration: 23.06 mins Patient history: 80 year old female with right upper extremity weakness and word finding difficulty, similar episode in October 2022.  EEG evaluate for seizure. Level of alertness: Awake, asleep AEDs during EEG study: None Technical aspects: This EEG study was done with scalp electrodes positioned according to the 10-20 International system of electrode placement. Electrical activity was acquired at a sampling rate of 500Hz  and reviewed with a high frequency filter of 70Hz  and a low frequency filter of 1Hz . EEG data were recorded continuously and digitally stored. Description: The posterior dominant rhythm consists of 8 Hz activity of moderate voltage (25-35 uV) seen predominantly in posterior head regions, symmetric and reactive to eye opening and eye closing. Sleep was characterized by vertex waves, sleep spindles (12 to 14 Hz), maximal frontocentral region. Hyperventilation and photic stimulation were not performed.   IMPRESSION: This study is within normal limits. No seizures or epileptiform discharges were seen throughout the recording. Lora Havens   CT HEAD CODE STROKE WO CONTRAST  Result Date: 06/24/2021 CLINICAL DATA:  Code stroke. Neuro deficit, acute, stroke suspected. Facial droop, slurred speech, right-sided weakness. EXAM: CT HEAD WITHOUT CONTRAST TECHNIQUE: Contiguous axial images were obtained from the base of the skull through the vertex without intravenous contrast. COMPARISON:  Brain MRI 05/03/2021.  CT angiogram head/neck 05/03/2021. Head CT 05/03/2021. FINDINGS: Brain: Mild generalized cerebral atrophy. 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 Background moderate/advanced patchy and ill-defined hypoattenuation within the cerebral white matter, nonspecific but compatible chronic small vessel ischemic disease. There is no acute intracranial hemorrhage. No demarcated cortical infarct. No extra-axial fluid collection. No evidence of an intracranial mass. No midline shift. Vascular: No hyperdense vessel.  Atherosclerotic calcifications. Skull: Normal. Negative for fracture or focal lesion. Sinuses/Orbits: Visualized orbits show no acute finding. Mild mucosal thickening within the bilateral ethmoid air cells. Chronic medially displaced fracture deformity of the right lamina papyracea. ASPECTS [facility] Stroke Program Early  CT Score) - Ganglionic level infarction (caudate, lentiform nuclei, internal capsule, insula, M1-M3 cortex): 7 - Supraganglionic infarction (M4-M6 cortex): 3 Total score (0-10 with 10 being normal): 10 These results were called by telephone at the time of interpretation on 06/24/2021 at 6:51 pm to provider Dr. Quinn Axe, who verbally acknowledged these results. IMPRESSION: No evidence of acute intracranial abnormality. Redemonstrated chronic lacunar infarcts within the bilateral deep gray nuclei and right cerebellar hemisphere. Moderate/advanced chronic small vessel ischemic changes within the cerebral white matter. Mild generalized cerebral atrophy. Electronically Signed   By: Kellie Simmering D.O.   On: 06/24/2021 18:53        Scheduled Meds:  aspirin EC  81 mg Oral Daily   brinzolamide  1 drop Both Eyes BID   rosuvastatin  10 mg Oral Daily   ticagrelor  90 mg Oral BID   timolol  1 drop Both Eyes BID   Continuous Infusions:  sodium chloride Stopped (06/25/21 0421)     LOS: 0 days   The patient is critically ill  with multiple organ systems failure and requires high complexity decision making for assessment and support, frequent evaluation and titration of therapies, application of advanced monitoring technologies and extensive interpretation of multiple databases. Critical Care Time devoted to patient care services described in this note  Time spent: 40 minutes     Ferol Laiche, Geraldo Docker, MD [facility]   If 7PM-7AM, please contact night-coverage 06/26/2021, 4:26 PM

## 2021-06-27 ENCOUNTER — Other Ambulatory Visit: Payer: Self-pay | Admitting: Oncology

## 2021-06-27 DIAGNOSIS — E782 Mixed hyperlipidemia: Secondary | ICD-10-CM

## 2021-06-27 DIAGNOSIS — E785 Hyperlipidemia, unspecified: Secondary | ICD-10-CM

## 2021-06-27 DIAGNOSIS — R7303 Prediabetes: Secondary | ICD-10-CM

## 2021-06-27 LAB — CBC WITH DIFFERENTIAL/PLATELET
Abs Immature Granulocytes: 0.04 10*3/uL (ref 0.00–0.07)
Basophils Absolute: 0.1 10*3/uL (ref 0.0–0.1)
Basophils Relative: 1 %
Eosinophils Absolute: 0.2 10*3/uL (ref 0.0–0.5)
Eosinophils Relative: 3 %
HCT: 39.4 % (ref 36.0–46.0)
Hemoglobin: 13.5 g/dL (ref 12.0–15.0)
Immature Granulocytes: 1 %
Lymphocytes Relative: 25 %
Lymphs Abs: 2.2 10*3/uL (ref 0.7–4.0)
MCH: 30.4 pg (ref 26.0–34.0)
MCHC: 34.3 g/dL (ref 30.0–36.0)
MCV: 88.7 fL (ref 80.0–100.0)
Monocytes Absolute: 1.1 10*3/uL — ABNORMAL HIGH (ref 0.1–1.0)
Monocytes Relative: 12 %
Neutro Abs: 5.3 10*3/uL (ref 1.7–7.7)
Neutrophils Relative %: 58 %
Platelets: 355 10*3/uL (ref 150–400)
RBC: 4.44 MIL/uL (ref 3.87–5.11)
RDW: 13.2 % (ref 11.5–15.5)
WBC: 8.9 10*3/uL (ref 4.0–10.5)
nRBC: 0 % (ref 0.0–0.2)

## 2021-06-27 LAB — COMPREHENSIVE METABOLIC PANEL
ALT: 49 U/L — ABNORMAL HIGH (ref 0–44)
AST: 29 U/L (ref 15–41)
Albumin: 3.7 g/dL (ref 3.5–5.0)
Alkaline Phosphatase: 46 U/L (ref 38–126)
Anion gap: 8 (ref 5–15)
BUN: 8 mg/dL (ref 8–23)
CO2: 24 mmol/L (ref 22–32)
Calcium: 8.6 mg/dL — ABNORMAL LOW (ref 8.9–10.3)
Chloride: 97 mmol/L — ABNORMAL LOW (ref 98–111)
Creatinine, Ser: 0.53 mg/dL (ref 0.44–1.00)
GFR, Estimated: >60 mL/min (ref >60)
Glucose, Bld: 118 mg/dL — ABNORMAL HIGH (ref 70–99)
Potassium: 3.4 mmol/L — ABNORMAL LOW (ref 3.5–5.1)
Sodium: 129 mmol/L — ABNORMAL LOW (ref 135–145)
Total Bilirubin: 0.7 mg/dL (ref 0.3–1.2)
Total Protein: 6.4 g/dL — ABNORMAL LOW (ref 6.5–8.1)

## 2021-06-27 LAB — MAGNESIUM: Magnesium: 2 mg/dL (ref 1.7–2.4)

## 2021-06-27 LAB — PHOSPHORUS: Phosphorus: 2.5 mg/dL (ref 2.5–4.6)

## 2021-06-27 MED ORDER — POTASSIUM CHLORIDE 10 MEQ/100ML IV SOLN
10.0000 meq | INTRAVENOUS | Status: AC
  Administered 2021-06-27 (×2): 10 meq via INTRAVENOUS
  Filled 2021-06-27 (×2): qty 100

## 2021-06-27 MED ORDER — MAGNESIUM SULFATE 2 GM/50ML IV SOLN
2.0000 g | Freq: Once | INTRAVENOUS | Status: AC
  Administered 2021-06-27: 2 g via INTRAVENOUS
  Filled 2021-06-27: qty 50

## 2021-06-27 NOTE — Progress Notes (Signed)
Inpatient Rehabilitation Admissions Coordinator    I met with patient and her spouse at bedside. I discussed that I will have a CIR bed in next 24 to 48 hrs for her to admit.  Danne Baxter, RN, MSN Rehab Admissions Coordinator [phone] 06/27/2021 10:35 AM

## 2021-06-27 NOTE — Progress Notes (Addendum)
PROGRESS NOTE  Sandra Becker NID:[id number] DOB: 06-Oct-1940 DOA: 06/24/2021 PCP: Kelton Pillar, MD   LOS: 1 day   Brief Narrative / Interim history: 80 year old female with history of breast cancer status post bilateral mastectomy, prior TIA, hypertension, glaucoma, comes to the hospital with sudden onset slurred speech and right-sided weakness.  Work-up after admission was positive for CVA and neurology was consulted  Subjective / 24h Interval events: She is doing well this morning, no new complaints.  No chest pain, no shortness of breath.  No abdominal pain, no nausea or vomiting  Assessment & Plan: Principal Problem:   Stroke [facility]) Active Problems:   Hyponatremia   Essential hypertension   Infarction of left basal ganglia (HCC)   Prediabetes   Hyperlipidemia, unspecified  Principal Problem Left basal ganglia infarct likely secondary to small vessel disease given location, however, can not completely rule out cardioembolic source due to scattered stroke distribution -neurology consulted and followed patient while hospitalized.  An MRI of the head showed mild diffuse narrowing of the left P2 segment.  2D echo was done and showed EF of 60-65%, aortic valve was mild-moderately calcified.  EEG was done it was normal without seizure or epileptiform discharges.  Neurology recommends 30-day cardiac monitor as an outpatient to rule out A. fib.  LDL was 69, A1c was 6.0.  Neurology now recommends aspirin 81 mg and Brilinta 90 mg twice daily for 30 days then continue Plavix alone.  Therapy is recommended CIR which is pending  Active Problems Prediabetes-A1c 5.7 Essential hypertension-stop chlorthalidone due to hyponatremia, at home she is on valsartan 325 mg, no longer taking losartan. Gradually resume in 3 to 4 days Hyperlipidemia-increase Crestor to 10 mg Hyponatremia-acute on chronic, stop chlorthalidone, stop IV fluids and allow equilibration.  Encourage p.o. intake  Scheduled  Meds:  aspirin EC  81 mg Oral Daily   brinzolamide  1 drop Both Eyes BID   rosuvastatin  10 mg Oral Daily   ticagrelor  90 mg Oral BID   timolol  1 drop Both Eyes BID   Continuous Infusions: PRN Meds:.acetaminophen **OR** acetaminophen (TYLENOL) oral liquid 160 mg/5 mL **OR** acetaminophen, senna-docusate  Diet Orders (From admission, onward)     Start     Ordered   06/25/21 1440  Diet regular Room service appropriate? Yes; Fluid consistency: Thin  Diet effective now       Question Answer Comment  Room service appropriate? Yes   Fluid consistency: Thin      06/25/21 1439            DVT prophylaxis: SCD's Start: 06/25/21 0028     Code Status: Full Code  Family Communication: no family at bedside   Status is: Inpatient  Remains inpatient appropriate because: awaiting CIR bed  Level of care: Telemetry Medical  Consultants:  Neurology   Procedures:  2D echo  Microbiology  none  Antimicrobials: none    Objective: Vitals:   06/26/21 1945 06/26/21 2317 06/27/21 0324 06/27/21 0759  BP: (!) 158/73 (!) 140/59 (!) 177/66 (!) 157/73  Pulse: 82 73 79 71  Resp: 17 17 16 14   Temp: (!) 97.5 F (36.4 C) 98 F (36.7 C) 97.9 F (36.6 C) 98.3 F (36.8 C)  TempSrc: Oral Oral Oral Oral  SpO2: 97% 100% 99% 99%  Weight:      Height:        Intake/Output Summary (Last 24 hours) at 06/27/2021 1139 Last data filed at 06/27/2021 0600 Gross per 24 hour  Intake 1260 ml  Output --  Net 1260 ml   Filed Weights   06/24/21 1800 06/24/21 1955  Weight: 73.6 kg 73.6 kg    Examination:  Constitutional: NAD Eyes: no scleral icterus ENMT: Mucous membranes are moist.  Neck: normal, supple Respiratory: clear to auscultation bilaterally, no wheezing, no crackles. Normal respiratory effort.  Cardiovascular: Regular rate and rhythm, no murmurs / rubs / gallops. No LE edema. Good peripheral pulses Abdomen: non distended, no tenderness. Bowel sounds positive.   Musculoskeletal: no clubbing / cyanosis.  Skin: no rashes Neurologic: 3/5 right upper and lower extremities, otherwise intact strength in the left  Data Reviewed: I have independently reviewed following labs and imaging studies   CBC: Recent Labs  Lab 06/24/21 1837 06/24/21 1841 06/25/21 0739 06/25/21 1403 06/26/21 0416 06/27/21 0056  WBC 9.8  --  9.1 9.3 8.0 8.9  NEUTROABS 6.1  --  5.5 5.5 4.8 5.3  HGB 14.2 15.3* 14.4 14.0 12.7 13.5  HCT 41.7 45.0 43.5 40.9 37.6 39.4  MCV 89.9  --  90.6 89.1 87.4 88.7  PLT 385  --  367 405* 363 952   Basic Metabolic Panel: Recent Labs  Lab 06/24/21 1837 06/24/21 1841 06/25/21 0739 06/25/21 1403 06/26/21 0416 06/27/21 0056  NA 126* 126* 127* 130* 131* 129*  K 3.5 3.6 3.8 4.2 3.6 3.4*  CL 91* 91* 92* 93* 99 97*  CO2 24  --  24 23 25 24   GLUCOSE 138* 134* 115* 114* 113* 118*  BUN 12 14 11 12 11 8   CREATININE 0.75 0.60 0.52 0.62 0.53 0.53  CALCIUM 10.1  --  9.5 9.6 8.9 8.6*  MG  --   --  1.8 1.8 1.8 2.0  PHOS  --   --  4.4 4.3 3.0 2.5   Liver Function Tests: Recent Labs  Lab 06/24/21 1837 06/25/21 0739 06/25/21 1403 06/26/21 0416 06/27/21 0056  AST 41 38 39 34 29  ALT 63* 58* 62* 53* 49*  ALKPHOS 53 50 52 45 46  BILITOT 0.7 0.7 0.8 0.7 0.7  PROT 7.0 6.6 6.8 6.2* 6.4*  ALBUMIN 4.1 3.9 4.2 3.6 3.7   Coagulation Profile: Recent Labs  Lab 06/24/21 1837  INR 0.9   HbA1C: Recent Labs    06/25/21 1403  HGBA1C 5.7*   CBG: Recent Labs  Lab 06/24/21 1835  GLUCAP 139*    Recent Results (from the past 240 hour(s))  Resp Panel by RT-PCR (Flu A&B, Covid) Nasopharyngeal Swab     Status: None   Collection Time: 06/24/21 11:48 PM   Specimen: Nasopharyngeal Swab; Nasopharyngeal(NP) swabs in vial transport medium  Result Value Ref Range Status   SARS Coronavirus 2 by RT PCR NEGATIVE NEGATIVE Final    Comment: (NOTE) SARS-CoV-2 target nucleic acids are NOT DETECTED.  The SARS-CoV-2 RNA is generally detectable in upper  respiratory specimens during the acute phase of infection. The lowest concentration of SARS-CoV-2 viral copies this assay can detect is 138 copies/mL. A negative result does not preclude SARS-Cov-2 infection and should not be used as the sole basis for treatment or other patient management decisions. A negative result may occur with  improper specimen collection/handling, submission of specimen other than nasopharyngeal swab, presence of viral mutation(s) within the areas targeted by this assay, and inadequate number of viral copies(<138 copies/mL). A negative result must be combined with clinical observations, patient history, and epidemiological information. The expected result is Negative.  Fact Sheet for Patients:  EntrepreneurPulse.com.au  Fact Sheet  for Healthcare Providers:  IncredibleEmployment.be  This test is no t yet approved or cleared by the [place] and  has been authorized for detection and/or diagnosis of SARS-CoV-2 by FDA under an Emergency Use Authorization (EUA). This EUA will remain  in effect (meaning this test can be used) for the duration of the COVID-19 declaration under Section 564(b)(1) of the Act, 21 U.S.C.section 360bbb-3(b)(1), unless the authorization is terminated  or revoked sooner.       Influenza A by PCR NEGATIVE NEGATIVE Final   Influenza B by PCR NEGATIVE NEGATIVE Final    Comment: (NOTE) The Xpert Xpress SARS-CoV-2/FLU/RSV plus assay is intended as an aid in the diagnosis of influenza from Nasopharyngeal swab specimens and should not be used as a sole basis for treatment. Nasal washings and aspirates are unacceptable for Xpert Xpress SARS-CoV-2/FLU/RSV testing.  Fact Sheet for Patients: EntrepreneurPulse.com.au  Fact Sheet for Healthcare Providers: IncredibleEmployment.be  This test is not yet approved or cleared by the [place] FDA and has been  authorized for detection and/or diagnosis of SARS-CoV-2 by FDA under an Emergency Use Authorization (EUA). This EUA will remain in effect (meaning this test can be used) for the duration of the COVID-19 declaration under Section 564(b)(1) of the Act, 21 U.S.C. section 360bbb-3(b)(1), unless the authorization is terminated or revoked.  Performed at [facility], [place] [address], [place], [place] [address]      Radiology Studies: No results found.   Marzetta Board, MD, PhD [facility]  Between 7 am - 7 pm I am available, please contact me via Amion (for emergencies) or Securechat (non urgent messages)  Between 7 pm - 7 am I am not available, please contact night coverage MD/APP via Amion

## 2021-06-27 NOTE — PMR Pre-admission (Signed)
PMR Admission Coordinator Pre-Admission Assessment  Patient: Sandra Becker is an 80 y.o., female MRN: [id number] DOB: [birth year] Height: 5' 5"  (165.1 cm) Weight: 73.6 kg  Insurance Information HMO:     PPO:      PCP:      IPA:      80/20:      OTHER:  PRIMARY: Medicare a and b      Policy#: [id number]      Subscriber: pt Benefits:  Phone #: passport one source     Name: 11/30 Eff. Date: 08/29/2005     Deduct: $1556      Out of Pocket Max: none      Life Max: none CIR: 100%      SNF: 20 full days Outpatient: 80%     Co-Pay: 20% Home Health: 100%      Co-Pay: none DME: 80%     Co-Pay: 20% Providers: pt choice  SECONDARY: AARP      Policy#: [id number]  Financial Counselor:       Phone#:   The "Data Collection Information Summary" for patients in Inpatient Rehabilitation Facilities with attached "Privacy Act [place] Records" was provided and verbally reviewed with: Patient and Family  Emergency Contact Information Contact Information     Name Relation Home Work Mobile   Lashomb,Maurice Spouse   [phone]      Current Medical History  Patient Admitting Diagnosis: CVA  History of Present Illness: 80 year old right-handed female with history of hypertension, glaucoma,  hyperlipidemia, breast cancer status post bilateral mastectomy, quit smoking 57 years ago.  Per chart review lives with spouse independent living facility/[facility].  Modified independent prior to admission.  Presented 06/24/2021 with acute onset of right-sided weakness and slurred speech.  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  She was placed on DAPT with aspirin and Plavix.admission chemistry sodium 126 glucose 138, hemoglobin 14.2.  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  MRA of the head showed no intracranial large vessel occlusion or stenosis.  EEG negative for seizure.   Most recent echocardiogram 05/04/2021 ejection fraction 60 to 65% no wall motion abnormalities grade 1 diastolic dysfunction.  Neurology follow-up currently maintained on aspirin 81 mg daily as well as Brilinta 90 mg twice daily x30 days then Plavix alone as well as plan for 30-day cardiac event monitor.  Tolerating a regular diet.  Therapy evaluations completed due to patient's right side weakness and slurred speech was admitted for a comprehensive rehab program.  Complete NIHSS TOTAL: 3  Patient's medical record from [facility] has been reviewed by the rehabilitation admission coordinator and physician.  Past Medical History  Past Medical History:  Diagnosis Date   Breast cancer [facility]) 1993   bilateral mastectomy; no chemo or radiation   Glaucoma    Hyperlipidemia    Hypertension    Has the patient had major surgery during 100 days prior to admission? No  Family History   family history is not on file.  Current Medications  Current Facility-Administered Medications:    acetaminophen (TYLENOL) tablet 650 mg, 650 mg, Oral, Q4H PRN **OR** acetaminophen (TYLENOL) 160 MG/5ML solution 650 mg, 650 mg, Per Tube, Q4H PRN **OR** acetaminophen (TYLENOL) suppository 650 mg, 650 mg, Rectal, Q4H PRN, Chotiner, Yevonne Aline, MD   aspirin EC tablet 81 mg, 81 mg, Oral, Daily, Chotiner, Yevonne Aline, MD, 81 mg at 06/28/21 0857   brinzolamide (  AZOPT) 1 % ophthalmic suspension 1 drop, 1 drop, Both Eyes, BID, Chotiner, Yevonne Aline, MD, 1 drop at 06/28/21 0857   rosuvastatin (CRESTOR) tablet 10 mg, 10 mg, Oral, Daily, Rosalin Hawking, MD, 10 mg at 06/28/21 0857   senna-docusate (Senokot-S) tablet 1 tablet, 1 tablet, Oral, QHS PRN, Chotiner, Yevonne Aline, MD   ticagrelor (BRILINTA) tablet 90 mg, 90 mg, Oral, BID, Rosalin Hawking, MD, 90 mg at 06/28/21 0857   timolol (TIMOPTIC) 0.5 % ophthalmic solution 1 drop, 1 drop, Both Eyes, BID, Chotiner, Yevonne Aline, MD, 1 drop at 06/28/21 0857  Patients Current Diet:  Diet Order              Diet regular Room service appropriate? Yes; Fluid consistency: Thin  Diet effective now                  Precautions / Restrictions Precautions Precautions: Fall Precaution Comments: decreased R awareness, pt wears hearing aids but batteries not currently charged per spouse Restrictions Weight Bearing Restrictions: No   Has the patient had 2 or more falls or a fall with injury in the past year? No  Prior Activity Level Community (5-7x/wk): Independent and active  Prior Functional Level Self Care: Did the patient need help bathing, dressing, using the toilet or eating? Independent  Indoor Mobility: Did the patient need assistance with walking from room to room (with or without device)? Independent  Stairs: Did the patient need assistance with internal or external stairs (with or without device)? Independent  Functional Cognition: Did the patient need help planning regular tasks such as shopping or remembering to take medications? Independent  Patient Information Are you of Hispanic, Latino/a,or Spanish origin?: A. No, not of Hispanic, Latino/a, or Spanish origin What is your race?: A. White Do you need or want an interpreter to communicate with a doctor or health care staff?: 0. No  Patient's Response To:  Health Literacy and Transportation Is the patient able to respond to health literacy and transportation needs?: Yes Health Literacy - How often do you need to have someone help you when you read instructions, pamphlets, or other written material from your doctor or pharmacy?: Never In the past 12 months, has lack of transportation kept you from medical appointments or from getting medications?: No In the past 12 months, has lack of transportation kept you from meetings, work, or from getting things needed for daily living?: No  Home Assistive Devices / Equipment Home Equipment: Conservation officer, nature (2 wheels), Wheelchair - manual (have access to [employer] and WC through  [facility])  Prior Device Use: Indicate devices/aids used by the patient prior to current illness, exacerbation or injury? None of the above  Current Functional Level Cognition  Arousal/Alertness: Awake/alert Overall Cognitive Status: Impaired/Different from baseline Current Attention Level: Sustained Orientation Level: Oriented X4 Following Commands: Follows one step commands with increased time Safety/Judgement: Decreased awareness of deficits, Decreased awareness of safety General Comments: R inattention, poor recall of safety cues even with repetition for some tasks Attention: Sustained Sustained Attention: Impaired Sustained Attention Impairment: Verbal basic, Functional basic Memory: Impaired Memory Impairment: Decreased recall of new information, Decreased short term memory, Retrieval deficit Decreased Short Term Memory: Verbal basic, Functional basic Awareness: Impaired Awareness Impairment: Anticipatory impairment, Intellectual impairment Executive Function: Writer: Impaired Organizing Impairment: Verbal basic, Functional basic Behaviors: Perseveration    Extremity Assessment (includes Sensation/Coordination)  Upper Extremity Assessment: RUE deficits/detail RUE Deficits / Details: apparent motor sensory processing deficits; decreased in-hand manipulation skills RUE Coordination: decreased  fine motor  Lower Extremity Assessment: Defer to PT evaluation LLE Deficits / Details: L hip deficits at baseline    ADLs  Overall ADL's : Needs assistance/impaired Eating/Feeding: Set up, Supervision/ safety Grooming: Minimal assistance Upper Body Bathing: Minimal assistance Lower Body Bathing: Moderate assistance, Sit to/from stand Upper Body Dressing : Minimal assistance Lower Body Dressing: Moderate assistance, Sit to/from stand Toilet Transfer: Minimal assistance Toileting- Clothing Manipulation and Hygiene: Minimal assistance Functional mobility during ADLs:  Minimal assistance General ADL Comments: Cues for task completion; ?perseveration during tasks    Mobility  Overal bed mobility: Needs Assistance Bed Mobility: Supine to Sit, Sit to Supine Supine to sit: Min guard Sit to supine: Min assist General bed mobility comments: Pt to long sit unassisted but increased time required to scoot to EOB and cues for sequencing. Pt needs mod cues for technique with return to supine and use of rail    Transfers  Overall transfer level: Needs assistance Equipment used: Rolling walker (2 wheels), None Transfers: Sit to/from Stand Sit to Stand: Min assist, Min guard General transfer comment: poor eccentric control and  decreased problem solving  and awareness of "plopping" when sitting down. Decreased carryover of safety cues even with reciprocal transfers, needs reminders for safe hand placement almost each rep with STS x 5 in a row.    Ambulation / Gait / Stairs / Wheelchair Mobility  Ambulation/Gait Ambulation/Gait assistance: Min guard, Herbalist (Feet): 125 Feet Assistive device: Rolling walker (2 wheels) Gait Pattern/deviations: Step-through pattern, Decreased stride length, Decreased dorsiflexion - right, Drifts right/left General Gait Details: Cues for wayfinding with ~50% carryover, pt needs dense cues for awareness of RW on R side especially when navigating crowded environment in hospital room. Pt had difficulty problem solving when unable to locate rooms she was encouraged to find, needed reminders to look for wall sign in hallway to locate specific rooms. She was able to recall her room number today. Gait velocity: Decreased    Posture / Balance Dynamic Sitting Balance Sitting balance - Comments: mild posterior LOB while scooting but mostly Supervision for static sitting and weight shifting at EOB Balance Overall balance assessment: Needs assistance Sitting-balance support: Feet supported, No upper extremity supported Sitting  balance-Leahy Scale: Fair Sitting balance - Comments: mild posterior LOB while scooting but mostly Supervision for static sitting and weight shifting at EOB Standing balance support: Single extremity supported, During functional activity Standing balance-Leahy Scale: Poor Standing balance comment: Reliant on at least 1 UE support but does better with BUE on RW for dynamic tasks    Special needs/care consideration 30 day monitor recommended as outpatient to rule out AFib Hgb A1c 5.7 Advised to not air travel for the holidays per Neurology for 2 weeks post stroke based on federal aviation recommendations   Previous Home Environment  Living Arrangements: Spouse/significant other  Lives With: Spouse Available Help at Discharge: Available 24 hours/day, Family Type of Home: Independent living facility Home Layout: One level Home Access: Building control surveyor Shower/Tub: Multimedia programmer: Standard Bathroom Accessibility: Yes How Accessible: Accessible via walker, Accessible via wheelchair Home Care Services: No Additional Comments: outpatient. Lives [facility]  Discharge Living Setting Plans for Discharge Living Setting: Patient's home, Lives with (comment), Other (Comment) (spouse) Type of Home at Discharge: [place] Name at Discharge: [facility] for 4 years Discharge Home Layout: One level Discharge Home Access: Elevator Discharge Bathroom Shower/Tub: Walk-in shower Discharge Bathroom Toilet: Standard Discharge Bathroom Accessibility:  Yes How Accessible: Accessible via walker Does the patient have any problems obtaining your medications?: No  Social/Family/Support Systems Patient Roles: Spouse Contact Information: spouse, [name] Anticipated Caregiver: spouse Anticipated Caregiver's Contact Information: see contacts Ability/Limitations of Caregiver: none Caregiver Availability: 24/7 Discharge Plan Discussed with  Primary Caregiver: Yes Is Caregiver In Agreement with Plan?: Yes Does Caregiver/Family have Issues with Lodging/Transportation while Pt is in Rehab?: No  Goals Patient/Family Goal for Rehab: Mod I to supervision with PT, OT and SLP Expected length of stay: ELOS 7 to 10 days Pt/Family Agrees to Admission and willing to participate: Yes Program Orientation Provided & Reviewed with Pt/Caregiver Including Roles  & Responsibilities: Yes  Decrease burden of Care through IP rehab admission: n/a  Possible need for SNF placement upon discharge: not anticipated  Patient Condition: I have reviewed medical records from [facility], spoken with CM, and patient and spouse. I met with patient at the bedside for inpatient rehabilitation assessment.  Patient will benefit from ongoing PT, OT, and SLP, can actively participate in 3 hours of therapy a day 5 days of the week, and can make measurable gains during the admission.  Patient will also benefit from the coordinated team approach during an Inpatient Acute Rehabilitation admission.  The patient will receive intensive therapy as well as Rehabilitation physician, nursing, social worker, and care management interventions.  Due to bladder management, bowel management, safety, skin/wound care, disease management, medication administration, pain management, and patient education the patient requires 24 hour a day rehabilitation nursing.  The patient is currently min assist overall  with mobility and basic ADLs.  Discharge setting and therapy post discharge at home with outpatient is anticipated.  Patient has agreed to participate in the Acute Inpatient Rehabilitation Program and will admit today.  Preadmission Screen Completed By:  Cleatrice Burke, 06/28/2021 10:24 AM ______________________________________________________________________   Discussed status with Dr. Dagoberto Ligas on 06/28/2021 at 1025 and received approval for admission today.  Admission  Coordinator:  Cleatrice Burke, RN, time  3664 Date 06/28/2021   Assessment/Plan: Diagnosis: Does the need for close, 24 hr/day Medical supervision in concert with the patient's rehab needs make it unreasonable for this patient to be served in a less intensive setting? Yes Co-Morbidities requiring supervision/potential complications: HOH, L basal ganglia infarct- R hemi; glaucoma; HTN; prediabetes; breast CA s/p B/L mastectomy Due to bladder management, bowel management, safety, skin/wound care, disease management, medication administration, pain management, and patient education, does the patient require 24 hr/day rehab nursing? Yes Does the patient require coordinated care of a physician, rehab nurse, PT, OT, and SLP to address physical and functional deficits in the context of the above medical diagnosis(es)? Yes Addressing deficits in the following areas: balance, endurance, locomotion, strength, transferring, bowel/bladder control, bathing, dressing, feeding, grooming, toileting, cognition, speech, and language Can the patient actively participate in an intensive therapy program of at least 3 hrs of therapy 5 days a week? Yes The potential for patient to make measurable gains while on inpatient rehab is good Anticipated functional outcomes upon discharge from inpatient rehab: modified independent and supervision PT, modified independent and supervision OT, modified independent and supervision SLP Estimated rehab length of stay to reach the above functional goals is: 7-10 days Anticipated discharge destination: Home 10. Overall Rehab/Functional Prognosis: good   MD Signature:

## 2021-06-27 NOTE — Progress Notes (Signed)
Physical Therapy Treatment Patient Details Name: Sandra Becker MRN: [id number] DOB: 06/01/1941 Today's Date: 06/27/2021   History of Present Illness Pt is an 80 y/o female admitted 11/27 secondary to speech difficulty and R sided weakness. 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 PMH includes breast cancer s/p bilateral mastectomy, glaucoma, HTN, and TIA.    PT Comments    Pt received in supine, agreeable to therapy session and with good participation and tolerance for gait training with RW and wayfinding task in hallway. Pt also performed supine LE exercises and reciprocal sit<>stand transfers for BLE strengthening with good tolerance, handout given to reinforce. Pt continues to benefit from PT services to progress toward functional mobility goals.  Pt with continued decreased R sided awareness and decreased short term memory, and would greatly benefit from short term high intensity post-acute rehab to return to independent mobility.  Recommendations for follow up therapy are one component of a multi-disciplinary discharge planning process, led by the attending physician.  Recommendations may be updated based on patient status, additional functional criteria and insurance authorization.  Follow Up Recommendations  Acute inpatient rehab (3hours/day)     Assistance Recommended at Discharge Frequent or constant Supervision/Assistance  Equipment Recommendations  Other (comment) (RW, per eval pt has access to this through ILF?)    Recommendations for Other Services       Precautions / Restrictions Precautions Precautions: Fall Precaution Comments: decreased R awareness, pt wears hearing aids but batteries not currently charged per spouse Restrictions Weight Bearing Restrictions: No     Mobility  Bed Mobility Overal bed mobility: Needs Assistance Bed Mobility: Supine to Sit;Sit to Supine     Supine to sit: Min guard Sit to supine: Min assist   General bed mobility comments: Pt  to long sit unassisted but increased time required to scoot to EOB and cues for sequencing. Pt needs mod cues for technique with return to supine and use of rail    Transfers Overall transfer level: Needs assistance Equipment used: Rolling walker (2 wheels);None Transfers: Sit to/from Stand Sit to Stand: Min assist;Min guard           General transfer comment: poor eccentric control and  decreased problem solving  and awareness of "plopping" when sitting down. Decreased carryover of safety cues even with reciprocal transfers, needs reminders for safe hand placement almost each rep with STS x 5 in a row.    Ambulation/Gait Ambulation/Gait assistance: Min guard;Min assist Gait Distance (Feet): 125 Feet Assistive device: Rolling walker (2 wheels) Gait Pattern/deviations: Step-through pattern;Decreased stride length;Decreased dorsiflexion - right;Drifts right/left Gait velocity: Decreased     General Gait Details: Cues for wayfinding with ~50% carryover, pt needs dense cues for awareness of RW on R side especially when navigating crowded environment in hospital room. Pt had difficulty problem solving when unable to locate rooms she was encouraged to find, needed reminders to look for wall sign in hallway to locate specific rooms. She was able to recall her room number today.       Modified Rankin (Stroke Patients Only) Modified Rankin (Stroke Patients Only) Pre-Morbid Rankin Score: No significant disability Modified Rankin: Moderately severe disability     Balance Overall balance assessment: Needs assistance Sitting-balance support: Feet supported;No upper extremity supported Sitting balance-Leahy Scale: Fair Sitting balance - Comments: mild posterior LOB while scooting but mostly Supervision for static sitting and weight shifting at EOB   Standing balance support: Single extremity supported;During functional activity Standing balance-Leahy Scale: Poor Standing balance  comment: Reliant on at least 1 UE support but does better with BUE on RW for dynamic tasks        Cognition Arousal/Alertness: Awake/alert Behavior During Therapy: Flat affect Overall Cognitive Status: Impaired/Different from baseline Area of Impairment: Problem solving;Following commands;Safety/judgement;Attention;Awareness;Memory       Current Attention Level: Sustained Memory: Decreased short-term memory Following Commands: Follows one step commands with increased time Safety/Judgement: Decreased awareness of deficits;Decreased awareness of safety Awareness: Emergent Problem Solving: Slow processing;Difficulty sequencing;Requires verbal cues;Decreased initiation General Comments: R inattention, poor recall of safety cues even with repetition for some tasks        Exercises Other Exercises Other Exercises: supine BLE AROM: heel slides, hip abduction, SLR x5 reps ea    General Comments General comments (skin integrity, edema, etc.): HEP handout given to reinforce supine/seated therex: [email or web address] Access Code: [email or web address]      Pertinent Vitals/Pain Pain Assessment: No/denies pain    Home Living   Additional Comments: Lives ILF at [facility] for [facility] years        PT Goals (current goals can now be found in the care plan section) Acute Rehab PT Goals PT Goal Formulation: With patient/family Time For Goal Achievement: 07/09/21 Progress towards PT goals: Progressing toward goals    Frequency    Min 4X/week      PT Plan Current plan remains appropriate       AM-PAC PT "6 Clicks" Mobility   Outcome Measure  Help needed turning from your back to your side while in a flat bed without using bedrails?: A Little Help needed moving from lying on your back to sitting on the side of a flat bed without using bedrails?: A Little Help needed moving to and from a bed to a chair (including a wheelchair)?: A Lot (mod cues for this and all items below) Help needed  standing up from a chair using your arms (e.g., wheelchair or bedside chair)?: A Lot Help needed to walk in hospital room?: A Lot Help needed climbing 3-5 steps with a railing? : A Lot 6 Click Score: 14    End of Session Equipment Utilized During Treatment: Gait belt Activity Tolerance: Patient tolerated treatment well Patient left: in bed;with call bell/phone within reach;with bed alarm set Nurse Communication: Mobility status PT Visit Diagnosis: Unsteadiness on feet (R26.81);Muscle weakness (generalized) (M62.81);Other symptoms and signs involving the nervous system (R29.898)     Time: 4944-9675 PT Time Calculation (min) (ACUTE ONLY): 26 min  Charges:  $Gait Training: 8-22 mins $Therapeutic Exercise: 8-22 mins                     Dawayne Ohair P., PTA Acute Rehabilitation Services Pager: [phone] Office: [place] 06/27/2021, 3:47 PM

## 2021-06-27 NOTE — H&P (Signed)
Physical Medicine and Rehabilitation Admission H&P    Chief Complaint  Patient presents with   Code Stroke  : HPI: Sandra Becker is an 80 year old right-handed female with history of hypertension, glaucoma,  hyperlipidemia, breast cancer status post bilateral mastectomy, quit smoking 57 years ago.  Per chart review lives with spouse independent living facility/[facility].She is a retired Chief Executive Officer . Modified independent prior to admission.  Presented 06/24/2021 with acute onset of right-sided weakness and slurred speech.  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  She was placed on DAPT with aspirin and Plavix.admission chemistry sodium 126 glucose 138, hemoglobin 14.2.  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  MRA of the head showed no intracranial large vessel occlusion or stenosis.  EEG negative for seizure.  Most recent echocardiogram 05/04/2021 ejection fraction 60 to 65% no wall motion abnormalities grade 1 diastolic dysfunction.  Neurology follow-up currently maintained on aspirin 81 mg daily as well as Brilinta 90 mg twice daily x30 days then Plavix alone as well as plan for 30-day cardiac event monitor.  Tolerating a regular diet.  Therapy evaluations completed due to patient's right side weakness and slurred speech was admitted for a comprehensive rehab program. Pt reports touble coming up with words- can't get the right words out.  LBM yesterday- denies pain; peeing OK.    Review of Systems  Constitutional:  Negative for chills and fever.  HENT:  Negative for hearing loss.   Eyes:  Negative for blurred vision and double vision.  Respiratory:  Negative for cough and shortness of breath.   Cardiovascular:  Negative for chest pain, palpitations and leg swelling.  Gastrointestinal:  Positive for constipation. Negative for heartburn, nausea and vomiting.   Genitourinary:  Negative for dysuria, flank pain and hematuria.  Musculoskeletal:  Positive for back pain, joint pain and myalgias.  Skin:  Negative for rash.  Neurological:  Positive for speech change and weakness.  All other systems reviewed and are negative. Past Medical History:  Diagnosis Date   Breast cancer ([place]) 1993   bilateral mastectomy; no chemo or radiation   Glaucoma    Hyperlipidemia    Hypertension    Past Surgical History:  Procedure Laterality Date   MASTECTOMY Bilateral 1993   TONSILLECTOMY AND ADENOIDECTOMY  1947   TUBAL LIGATION  1974   Family History  Problem Relation Age of Onset   Colon cancer Neg Hx    Social History:  reports that she quit smoking about 57 years ago. She has never used smokeless tobacco. She reports current alcohol use of about 21.0 standard drinks per week. She reports that she does not use drugs. Allergies:  Allergies  Allergen Reactions   Crab [Shellfish Allergy] Other (See Comments)    From skin test   Fosamax [Alendronate Sodium] Other (See Comments)    Difficult swallowing   Hydrochlorothiazide     Other reaction(s): hyponatremia   Raloxifene     Other reaction(s): Unknown   Ace Inhibitors Cough   Medications Prior to Admission  Medication Sig Dispense Refill   aspirin 81 MG EC tablet Take 1 tablet (81 mg total) by mouth daily. Swallow whole. 30 tablet 11   brinzolamide (AZOPT) 1 % ophthalmic suspension Place 1 drop into both eyes 2 (two) times daily.      Calcium Carbonate (CALCIUM 500 PO) Take 1 tablet by mouth daily.  chlorthalidone (HYGROTON) 25 MG tablet Take 25 mg by mouth daily.     Multiple Vitamin (MULTIVITAMIN) tablet Take 1 tablet by mouth daily.     Omega-3 Fatty Acids (FISH OIL) 1000 MG CPDR Take 1,000 mg by mouth daily.     rosuvastatin (CRESTOR) 5 MG tablet Take 5 mg by mouth at bedtime.     timolol (TIMOPTIC) 0.5 % ophthalmic solution Place 1 drop into both eyes 2 (two) times daily.     valsartan  (DIOVAN) 320 MG tablet Take 320 mg by mouth daily.     vitamin C (ASCORBIC ACID) 500 MG tablet Take 500 mg by mouth daily.     COVID-19 mRNA vaccine, Moderna, 100 MCG/0.5ML injection Inject into the muscle. 0.25 mL 0   losartan (COZAAR) 50 MG tablet Take 50 mg by mouth daily. (Patient not taking: Reported on 06/25/2021)      Drug Regimen Review Drug regimen was reviewed and remains appropriate with no significant issues identified  Home: Home Living Family/patient expects to be discharged to:: Private residence ([place]) Living Arrangements: Spouse/significant other Available Help at Discharge: Available 24 hours/day, Family Type of Home: Independent living facility Home Access: Elevator Home Layout: One level Bathroom Shower/Tub: Multimedia programmer: Standard Bathroom Accessibility: Yes Home Equipment: Conservation officer, nature (2 wheels), Wheelchair - manual (have access to [employer] and WC through [facility]) Additional Comments: outpatient. Lives ILF at [facility] for 4 years  Lives With: Spouse   Functional History: Prior Function Prior Level of Function : Independent/Modified Independent Mobility Comments: was going ot outpt PT; driving; enjoys water aerobics  Functional Status:  Mobility: Bed Mobility Overal bed mobility: Needs Assistance Bed Mobility: Supine to Sit, Sit to Supine Supine to sit: Min guard Sit to supine: Min assist General bed mobility comments: Pt to long sit unassisted but increased time required to scoot to EOB and cues for sequencing. Pt needs mod cues for technique with return to supine and use of rail Transfers Overall transfer level: Needs assistance Equipment used: Rolling walker (2 wheels), None Transfers: Sit to/from Stand Sit to Stand: Min assist, Min guard General transfer comment: poor eccentric control and  decreased problem solving  and awareness of "plopping" when sitting down. Decreased carryover of safety cues even with  reciprocal transfers, needs reminders for safe hand placement almost each rep with STS x 5 in a row. Ambulation/Gait Ambulation/Gait assistance: Min guard, Min assist Gait Distance (Feet): 125 Feet Assistive device: Rolling walker (2 wheels) Gait Pattern/deviations: Step-through pattern, Decreased stride length, Decreased dorsiflexion - right, Drifts right/left General Gait Details: Cues for wayfinding with ~50% carryover, pt needs dense cues for awareness of RW on R side especially when navigating crowded environment in hospital room. Pt had difficulty problem solving when unable to locate rooms she was encouraged to find, needed reminders to look for wall sign in hallway to locate specific rooms. She was able to recall her room number today. Gait velocity: Decreased    ADL: ADL Overall ADL's : Needs assistance/impaired Eating/Feeding: Set up, Supervision/ safety Grooming: Minimal assistance Upper Body Bathing: Minimal assistance Lower Body Bathing: Moderate assistance, Sit to/from stand Upper Body Dressing : Minimal assistance Lower Body Dressing: Moderate assistance, Sit to/from stand Toilet Transfer: Minimal assistance Toileting- Clothing Manipulation and Hygiene: Minimal assistance Functional mobility during ADLs: Minimal assistance General ADL Comments: Cues for task completion; ?perseveration during tasks  Cognition: Cognition Overall Cognitive Status: Impaired/Different from baseline Arousal/Alertness: Awake/alert Orientation Level: Oriented X4 Year: 2022 Month: November Day of Week:  Correct Attention: Sustained Sustained Attention: Impaired Sustained Attention Impairment: Verbal basic, Functional basic Memory: Impaired Memory Impairment: Decreased recall of new information, Decreased short term memory, Retrieval deficit Decreased Short Term Memory: Verbal basic, Functional basic Immediate Memory Recall: Sock, Blue, Bed Memory Recall Sock: Without Cue Memory Recall  Blue: With Cue Memory Recall Bed: With Cue Awareness: Impaired Awareness Impairment: Anticipatory impairment, Intellectual impairment Executive Function: Writer: Impaired Organizing Impairment: Verbal basic, Functional basic Behaviors: Perseveration Cognition Arousal/Alertness: Awake/alert Behavior During Therapy: Flat affect Overall Cognitive Status: Impaired/Different from baseline Area of Impairment: Problem solving, Following commands, Safety/judgement, Attention, Awareness, Memory Current Attention Level: Sustained Memory: Decreased short-term memory Following Commands: Follows one step commands with increased time Safety/Judgement: Decreased awareness of deficits, Decreased awareness of safety Awareness: Emergent Problem Solving: Slow processing, Difficulty sequencing, Requires verbal cues, Decreased initiation General Comments: R inattention, poor recall of safety cues even with repetition for some tasks  Physical Exam: Blood pressure 129/66, pulse 71, temperature 97.7 F (36.5 C), temperature source Oral, resp. rate 16, height 5\' 5"  (1.651 m), weight 73.6 kg, SpO2 98 %. Physical Exam Vitals and nursing note reviewed.  Constitutional:      Comments: Awake, alert, appropriate, word finding issues; sitting up in bed reading, NAD  HENT:     Head: Normocephalic and atraumatic.     Comments: R facial droop- tongue midline; facial sensation intact    Right Ear: External ear normal.     Left Ear: External ear normal.     Nose: Nose normal. No congestion.     Mouth/Throat:     Mouth: Mucous membranes are dry.     Pharynx: Oropharynx is clear. No oropharyngeal exudate.  Eyes:     General:        Right eye: No discharge.        Left eye: No discharge.     Extraocular Movements: Extraocular movements intact.     Comments: No nystagmus  Cardiovascular:     Rate and Rhythm: Normal rate and regular rhythm.     Heart sounds: Normal heart sounds. No murmur heard.    No gallop.  Pulmonary:     Effort: Pulmonary effort is normal. No respiratory distress.     Breath sounds: Normal breath sounds. No wheezing, rhonchi or rales.  Abdominal:     Comments: Soft, NT, ND, (+)BS    Musculoskeletal:     Cervical back: Normal range of motion. No rigidity.     Comments: LUE/LLE- 5/5 RUE- 5-/5 in biceps/triceps/WR, grip and FA RLE_ 4+/5 in HF/KE/KF/DF and PF  Skin:    General: Skin is warm and dry.     Comments: R hand IV- looks OK- old IV site L forearm- healing well   Neurological:     Comments: Patient is alert.  Makes eye contact with examiner.  Mild expressive aphasia however she was able to provide her name place and her name objects.  She follows simple commands. 3/3 naming with cues for 1 object Named 5 animals in 30 seconds, but repeated 2 (dogs/puppies/cats/kittens) Decreased coordination on RUE Finger to nose slightly slowed, but intact B/L R inattention noted  Psychiatric:     Comments: Flat; appropriate    Results for orders placed or performed during the hospital encounter of 06/24/21 (from the past 48 hour(s))  Comprehensive metabolic panel     Status: Abnormal   Collection Time: 06/27/21 12:56 AM  Result Value Ref Range   Sodium 129 (L) 135 - 145 mmol/L  Potassium 3.4 (L) 3.5 - 5.1 mmol/L   Chloride 97 (L) 98 - 111 mmol/L   CO2 24 22 - 32 mmol/L   Glucose, Bld 118 (H) 70 - 99 mg/dL    Comment: Glucose reference range applies only to samples taken after fasting for at least 8 hours.   BUN 8 8 - 23 mg/dL   Creatinine, Ser 0.53 0.44 - 1.00 mg/dL   Calcium 8.6 (L) 8.9 - 10.3 mg/dL   Total Protein 6.4 (L) 6.5 - 8.1 g/dL   Albumin 3.7 3.5 - 5.0 g/dL   AST 29 15 - 41 U/L   ALT 49 (H) 0 - 44 U/L   Alkaline Phosphatase 46 38 - 126 U/L   Total Bilirubin 0.7 0.3 - 1.2 mg/dL   GFR, Estimated >60 >60 mL/min    Comment: (NOTE) Calculated using the CKD-EPI Creatinine Equation (2021)    Anion gap 8 5 - 15    Comment: Performed at [facility], [place] [address], [place], [place] [address]  Magnesium     Status: None   Collection Time: 06/27/21 12:56 AM  Result Value Ref Range   Magnesium 2.0 1.7 - 2.4 mg/dL    Comment: Performed at [facility] [address] [place], [place] [address]  Phosphorus     Status: None   Collection Time: 06/27/21 12:56 AM  Result Value Ref Range   Phosphorus 2.5 2.5 - 4.6 mg/dL    Comment: Performed at [facility] [address] [place], [place] [address]  CBC with Differential/Platelet     Status: Abnormal   Collection Time: 06/27/21 12:56 AM  Result Value Ref Range   WBC 8.9 4.0 - 10.5 K/uL   RBC 4.44 3.87 - 5.11 MIL/uL   Hemoglobin 13.5 12.0 - 15.0 g/dL   HCT 39.4 36.0 - 46.0 %   MCV 88.7 80.0 - 100.0 fL   MCH 30.4 26.0 - 34.0 pg   MCHC 34.3 30.0 - 36.0 g/dL   RDW 13.2 11.5 - 15.5 %   Platelets 355 150 - 400 K/uL   nRBC 0.0 0.0 - 0.2 %   Neutrophils Relative % 58 %   Neutro Abs 5.3 1.7 - 7.7 K/uL   Lymphocytes Relative 25 %   Lymphs Abs 2.2 0.7 - 4.0 K/uL   Monocytes Relative 12 %   Monocytes Absolute 1.1 (H) 0.1 - 1.0 K/uL   Eosinophils Relative 3 %   Eosinophils Absolute 0.2 0.0 - 0.5 K/uL   Basophils Relative 1 %   Basophils Absolute 0.1 0.0 - 0.1 K/uL   Immature Granulocytes 1 %   Abs Immature Granulocytes 0.04 0.00 - 0.07 K/uL    Comment: Performed at [facility] [address], [place],  [address]  Comprehensive metabolic panel     Status: Abnormal   Collection Time: 06/28/21  1:31 AM  Result Value Ref Range   Sodium 129 (L) 135 - 145 mmol/L   Potassium 3.6 3.5 - 5.1 mmol/L   Chloride 97 (L) 98 - 111 mmol/L   CO2 25 22 - 32 mmol/L   Glucose, Bld 114 (H) 70 - 99 mg/dL    Comment: Glucose reference range applies only to samples taken after fasting for at least 8 hours.   BUN 15 8 - 23 mg/dL   Creatinine, Ser 0.61 0.44 - 1.00 mg/dL   Calcium 8.9 8.9 - 10.3 mg/dL   Total Protein 6.5 6.5 - 8.1 g/dL  Albumin 3.6 3.5 - 5.0 g/dL   AST 26  15 - 41 U/L   ALT 41 0 - 44 U/L   Alkaline Phosphatase 45 38 - 126 U/L   Total Bilirubin 0.8 0.3 - 1.2 mg/dL   GFR, Estimated >60 >60 mL/min    Comment: (NOTE) Calculated using the CKD-EPI Creatinine Equation (2021)    Anion gap 7 5 - 15    Comment: Performed at [facility] [address], [place], [place] [address]  Magnesium     Status: None   Collection Time: 06/28/21  1:31 AM  Result Value Ref Range   Magnesium 2.1 1.7 - 2.4 mg/dL    Comment: Performed at [facility] [address], [place], [place] [address]  Phosphorus     Status: None   Collection Time: 06/28/21  1:31 AM  Result Value Ref Range   Phosphorus 3.4 2.5 - 4.6 mg/dL    Comment: Performed at [facility] [address] [place], [place] [address]  CBC with Differential/Platelet     Status: Abnormal   Collection Time: 06/28/21  1:31 AM  Result Value Ref Range   WBC 10.6 (H) 4.0 - 10.5 K/uL   RBC 4.51 3.87 - 5.11 MIL/uL   Hemoglobin 13.6 12.0 - 15.0 g/dL   HCT 39.9 36.0 - 46.0 %   MCV 88.5 80.0 - 100.0 fL   MCH 30.2 26.0 - 34.0 pg   MCHC 34.1 30.0 - 36.0 g/dL   RDW 13.1 11.5 - 15.5 %   Platelets 366 150 - 400 K/uL   nRBC 0.0 0.0 - 0.2 %   Neutrophils Relative % 61 %   Neutro Abs 6.6 1.7 - 7.7 K/uL   Lymphocytes Relative 24 %   Lymphs Abs 2.5 0.7 - 4.0 K/uL   Monocytes Relative 12 %   Monocytes Absolute 1.2 (H) 0.1 - 1.0 K/uL   Eosinophils Relative 2 %   Eosinophils Absolute 0.2 0.0 - 0.5 K/uL   Basophils Relative 1 %   Basophils Absolute 0.1 0.0 - 0.1 K/uL   Immature Granulocytes 0 %   Abs Immature Granulocytes 0.04 0.00 - 0.07 K/uL    Comment: Performed at [facility] [address] [place] [address], [place],  [address]   No results found.     Medical Problem List and Plan: 1.  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  Plan 30-day cardiac event  monitor  -patient may  shower  -ELOS/Goals: mid I to supervision- 7-10 days 2.  Antithrombotics: -DVT/anticoagulation:   need to determine if needs Lovenox  -antiplatelet therapy: Aspirin 81 mg daily and Brilinta 90 mg twice daily x30 days then Plavix alone 3. Pain Management: Tylenol as needed 4. Mood: Provide emotional support  -antipsychotic agents: N/A 5. Neuropsych: This patient is capable of making decisions on her own behalf. 6. Skin/Wound Care: Routine skin checks 7. Fluids/Electrolytes/Nutrition: Routine in and outs with follow-up chemistries 8.  Hyperlipidemia.  Crestor 9.  Glaucoma.  Continue eyedrops 10.  History of breast cancer status post bilateral mastectomy.  Follow-up outpatient 11.  Permissive hypertension.  Chlorthalidone discontinued due to bouts of hyponatremia.  Patient on valsartan 325 mg daily as well as Cozaar 50 mg daily prior to admission.  Resume as needed 12. R inattention and aphasia-xpressive- per primary team   I have personally performed a face to  face diagnostic evaluation of this patient and formulated the key components of the plan.  Additionally, I have personally reviewed laboratory data, imaging studies, as well as relevant notes and concur with the physician assistant's documentation above.   The patient's status has not changed from the original H&P.  Any changes in documentation from the acute care chart have been noted above.     Lavon Paganini Angiulli, PA-C 06/28/2021

## 2021-06-27 NOTE — Progress Notes (Signed)
Occupational Therapy Treatment Patient Details Name: Sandra Becker MRN: [id number] DOB: Dec 05, 1940 Today's Date: 06/27/2021   History of present illness Pt is an 80 y/o female admitted 11/27 secondary to speech difficulty and R sided weakness. 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 PMH includes breast cancer s/p bilateral mastectomy, glaucoma, HTN, and TIA.   OT comments  Pt making progress toward goals however continues to demonstrate a significant decline in functional level of independence due to deficits listed below.  Pt will greatly benefit from intensive rehab at CIR to maximize functional level of independence and facilitate safe DC home with supportive husband.    Recommendations for follow up therapy are one component of a multi-disciplinary discharge planning process, led by the attending physician.  Recommendations may be updated based on patient status, additional functional criteria and insurance authorization.    Follow Up Recommendations  Acute inpatient rehab (3hours/day)    Assistance Recommended at Discharge Frequent or constant Supervision/Assistance  Equipment Recommendations  BSC/3in1    Recommendations for Other Services      Precautions / Restrictions Precautions Precautions: Fall Precaution Comments: decreased R awareness       Mobility Bed Mobility Overal bed mobility: Needs Assistance Bed Mobility: Supine to Sit;Sit to Supine     Supine to sit: Min assist          Transfers Overall transfer level: Needs assistance   Transfers: Sit to/from Stand Sit to Stand: Min assist           General transfer comment: falling posteriorly; decreased problem solving regarding need to move toward EOB to help with transfer; Assist requires to control descent rather than "fall" into chair     Balance Overall balance assessment: Needs assistance Sitting-balance support: Feet supported;No upper extremity supported Sitting balance-Leahy Scale: Fair      Standing balance support: Single extremity supported;During functional activity Standing balance-Leahy Scale: Poor Standing balance comment: Reliant on at least 1 UE support but does better with BUE on RW for dynamic tasks                           ADL either performed or assessed with clinical judgement   ADL Overall ADL's : Needs assistance/impaired Eating/Feeding: Set up;Supervision/ safety   Grooming: Minimal assistance; using R hand with great difficulty to b rush teeth; cues to stop brushing teeth; increased time for all processing   Upper Body Bathing: Minimal assistance   Lower Body Bathing: Moderate assistance;Sit to/from stand   Upper Body Dressing : Minimal assistance   Lower Body Dressing: Moderate assistance;Sit to/from stand   Toilet Transfer: Minimal assistance   Toileting- Clothing Manipulation and Hygiene: Minimal assistance       Functional mobility during ADLs: Minimal assistance General ADL Comments: Cues for task completion; ?perseveration during tasks; easily distracted during tasks    Extremity/Trunk Assessment Upper Extremity Assessment Upper Extremity Assessment: RUE deficits/detail RUE Deficits / Details: apparent motor sensory processing deficits; decreased in-hand manipulation skills RUE Coordination: decreased fine motor   Lower Extremity Assessment Lower Extremity Assessment: Defer to PT evaluation        Vision   Vision Assessment?: Vision impaired- to be further tested in functional context   Perception Perception Perception: Impaired   Praxis Praxis Praxis:  (poor spatial awarenss) Praxis-Other Comments: motor sensory impairment    Cognition Arousal/Alertness: Awake/alert Behavior During Therapy: Flat affect Overall Cognitive Status: Impaired/Different from baseline Area of Impairment: Problem solving;Following commands;Safety/judgement;Attention;Awareness;Memory  Current Attention  Level: Sustained Memory: Decreased short-term memory Following Commands: Follows one step commands with increased time Safety/Judgement: Decreased awareness of deficits;Decreased awareness of safety Awareness: Emergent Problem Solving: Slow processing;Difficulty sequencing;Requires verbal cues;Decreased initiation General Comments: R inattention; difficulty recalling 3 step task          Exercises     Shoulder Instructions       General Comments      Pertinent Vitals/ Pain          Home Living                                          Prior Functioning/Environment              Frequency  Min 2X/week        Progress Toward Goals  OT Goals(current goals can now be found in the care plan section)  Progress towards OT goals: Progressing toward goals  Acute Rehab OT Goals Patient Stated Goal: to go to rehab OT Goal Formulation: With patient/family Time For Goal Achievement: 07/09/21 Potential to Achieve Goals: Good ADL Goals Pt Will Perform Upper Body Bathing: with modified independence Pt Will Perform Lower Body Bathing: with modified independence Pt Will Perform Upper Body Dressing: with modified independence Pt Will Perform Lower Body Dressing: with modified independence Pt Will Transfer to Toilet: with modified independence Pt Will Perform Toileting - Clothing Manipulation and hygiene: with modified independence Additional ADL Goal #1: Pt will independently verbalize 3 strategies to reduce risk of falls  Plan Discharge plan remains appropriate    Co-evaluation                 AM-PAC OT "6 Clicks" Daily Activity     Outcome Measure   Help from another person eating meals?: A Little Help from another person taking care of personal grooming?: A Little Help from another person toileting, which includes using toliet, bedpan, or urinal?: A Lot Help from another person bathing (including washing, rinsing, drying)?: A Lot Help from  another person to put on and taking off regular upper body clothing?: A Little Help from another person to put on and taking off regular lower body clothing?: A Lot 6 Click Score: 15    End of Session Equipment Utilized During Treatment: Gait belt  OT Visit Diagnosis: Unsteadiness on feet (R26.81);Other abnormalities of gait and mobility (R26.89);Muscle weakness (generalized) (M62.81);Other symptoms and signs involving cognitive function   Activity Tolerance Patient tolerated treatment well   Patient Left in chair;with call bell/phone within reach;with chair alarm set;with family/visitor present   Nurse Communication Mobility status        Time: 0017-4944 OT Time Calculation (min): 39 min  Charges: OT General Charges $OT Visit: 1 Visit OT Treatments $Self Care/Home Management : 38-52 mins  [facility], OT 06/27/2021   Mallerie Blok,HILLARY 06/27/2021, 2:06 PM

## 2021-06-28 ENCOUNTER — Encounter (HOSPITAL_COMMUNITY): Payer: Self-pay | Admitting: Physical Medicine & Rehabilitation

## 2021-06-28 ENCOUNTER — Other Ambulatory Visit: Payer: Self-pay

## 2021-06-28 ENCOUNTER — Inpatient Hospital Stay (HOSPITAL_COMMUNITY)
Admission: RE | Admit: 2021-06-28 | Discharge: 2021-07-07 | DRG: 057 | Disposition: A | Discharge status: Home / Self Care | Payer: Medicare Other | Source: Intra-hospital | Attending: Physical Medicine & Rehabilitation | Admitting: Physical Medicine & Rehabilitation

## 2021-06-28 DIAGNOSIS — E785 Hyperlipidemia, unspecified: Secondary | ICD-10-CM | POA: Diagnosis present

## 2021-06-28 DIAGNOSIS — I69320 Aphasia following cerebral infarction: Secondary | ICD-10-CM | POA: Diagnosis not present

## 2021-06-28 DIAGNOSIS — I69319 Unspecified symptoms and signs involving cognitive functions following cerebral infarction: Secondary | ICD-10-CM

## 2021-06-28 DIAGNOSIS — E871 Hypo-osmolality and hyponatremia: Secondary | ICD-10-CM | POA: Diagnosis present

## 2021-06-28 DIAGNOSIS — R7303 Prediabetes: Secondary | ICD-10-CM

## 2021-06-28 DIAGNOSIS — H409 Unspecified glaucoma: Secondary | ICD-10-CM | POA: Diagnosis present

## 2021-06-28 DIAGNOSIS — R2689 Other abnormalities of gait and mobility: Secondary | ICD-10-CM | POA: Diagnosis present

## 2021-06-28 DIAGNOSIS — Z853 Personal history of malignant neoplasm of breast: Secondary | ICD-10-CM

## 2021-06-28 DIAGNOSIS — I69398 Other sequelae of cerebral infarction: Secondary | ICD-10-CM | POA: Diagnosis not present

## 2021-06-28 DIAGNOSIS — I10 Essential (primary) hypertension: Secondary | ICD-10-CM | POA: Diagnosis present

## 2021-06-28 DIAGNOSIS — Z9013 Acquired absence of bilateral breasts and nipples: Secondary | ICD-10-CM

## 2021-06-28 DIAGNOSIS — I69351 Hemiplegia and hemiparesis following cerebral infarction affecting right dominant side: Secondary | ICD-10-CM | POA: Diagnosis not present

## 2021-06-28 DIAGNOSIS — I639 Cerebral infarction, unspecified: Secondary | ICD-10-CM | POA: Diagnosis not present

## 2021-06-28 LAB — COMPREHENSIVE METABOLIC PANEL
ALT: 41 U/L (ref 0–44)
AST: 26 U/L (ref 15–41)
Albumin: 3.6 g/dL (ref 3.5–5.0)
Alkaline Phosphatase: 45 U/L (ref 38–126)
Anion gap: 7 (ref 5–15)
BUN: 15 mg/dL (ref 8–23)
CO2: 25 mmol/L (ref 22–32)
Calcium: 8.9 mg/dL (ref 8.9–10.3)
Chloride: 97 mmol/L — ABNORMAL LOW (ref 98–111)
Creatinine, Ser: 0.61 mg/dL (ref 0.44–1.00)
GFR, Estimated: >60 mL/min (ref >60)
Glucose, Bld: 114 mg/dL — ABNORMAL HIGH (ref 70–99)
Potassium: 3.6 mmol/L (ref 3.5–5.1)
Sodium: 129 mmol/L — ABNORMAL LOW (ref 135–145)
Total Bilirubin: 0.8 mg/dL (ref 0.3–1.2)
Total Protein: 6.5 g/dL (ref 6.5–8.1)

## 2021-06-28 LAB — CBC WITH DIFFERENTIAL/PLATELET
Abs Immature Granulocytes: 0.04 10*3/uL (ref 0.00–0.07)
Basophils Absolute: 0.1 10*3/uL (ref 0.0–0.1)
Basophils Relative: 1 %
Eosinophils Absolute: 0.2 10*3/uL (ref 0.0–0.5)
Eosinophils Relative: 2 %
HCT: 39.9 % (ref 36.0–46.0)
Hemoglobin: 13.6 g/dL (ref 12.0–15.0)
Immature Granulocytes: 0 %
Lymphocytes Relative: 24 %
Lymphs Abs: 2.5 10*3/uL (ref 0.7–4.0)
MCH: 30.2 pg (ref 26.0–34.0)
MCHC: 34.1 g/dL (ref 30.0–36.0)
MCV: 88.5 fL (ref 80.0–100.0)
Monocytes Absolute: 1.2 10*3/uL — ABNORMAL HIGH (ref 0.1–1.0)
Monocytes Relative: 12 %
Neutro Abs: 6.6 10*3/uL (ref 1.7–7.7)
Neutrophils Relative %: 61 %
Platelets: 366 10*3/uL (ref 150–400)
RBC: 4.51 MIL/uL (ref 3.87–5.11)
RDW: 13.1 % (ref 11.5–15.5)
WBC: 10.6 10*3/uL — ABNORMAL HIGH (ref 4.0–10.5)
nRBC: 0 % (ref 0.0–0.2)

## 2021-06-28 LAB — PHOSPHORUS: Phosphorus: 3.4 mg/dL (ref 2.5–4.6)

## 2021-06-28 LAB — MAGNESIUM: Magnesium: 2.1 mg/dL (ref 1.7–2.4)

## 2021-06-28 MED ORDER — ROSUVASTATIN CALCIUM 5 MG PO TABS
10.0000 mg | ORAL_TABLET | Freq: Every day | ORAL | Status: DC
  Administered 2021-06-29 – 2021-07-07 (×9): 10 mg via ORAL
  Filled 2021-06-28 (×9): qty 2

## 2021-06-28 MED ORDER — VALSARTAN 320 MG PO TABS: 320.0000 mg | ORAL_TABLET | Freq: Every day | ORAL | Status: DC

## 2021-06-28 MED ORDER — BRINZOLAMIDE 1 % OP SUSP
1.0000 [drp] | Freq: Two times a day (BID) | OPHTHALMIC | Status: DC
  Administered 2021-06-28 – 2021-07-07 (×18): 1 [drp] via OPHTHALMIC

## 2021-06-28 MED ORDER — TICAGRELOR 90 MG PO TABS: 90.0000 mg | ORAL_TABLET | Freq: Two times a day (BID) | ORAL | Status: DC

## 2021-06-28 MED ORDER — BLOOD PRESSURE CONTROL BOOK
Freq: Once | Status: DC
  Filled 2021-06-28: qty 1

## 2021-06-28 MED ORDER — SENNOSIDES-DOCUSATE SODIUM 8.6-50 MG PO TABS
1.0000 | ORAL_TABLET | Freq: Every evening | ORAL | Status: DC | PRN
  Administered 2021-07-01 – 2021-07-06 (×2): 1 via ORAL
  Filled 2021-06-28 (×3): qty 1

## 2021-06-28 MED ORDER — TIMOLOL MALEATE 0.5 % OP SOLN
1.0000 [drp] | Freq: Two times a day (BID) | OPHTHALMIC | Status: DC
  Administered 2021-06-28 – 2021-07-07 (×18): 1 [drp] via OPHTHALMIC

## 2021-06-28 MED ORDER — ASPIRIN EC 81 MG PO TBEC
81.0000 mg | DELAYED_RELEASE_TABLET | Freq: Every day | ORAL | Status: DC
  Administered 2021-06-29 – 2021-07-07 (×9): 81 mg via ORAL
  Filled 2021-06-28 (×9): qty 1

## 2021-06-28 MED ORDER — ROSUVASTATIN CALCIUM 10 MG PO TABS: 10.0000 mg | ORAL_TABLET | Freq: Every day | ORAL | Status: DC

## 2021-06-28 MED ORDER — ACETAMINOPHEN 650 MG RE SUPP: 650.0000 mg | RECTAL | Status: DC | PRN

## 2021-06-28 MED ORDER — ACETAMINOPHEN 325 MG PO TABS
650.0000 mg | ORAL_TABLET | ORAL | Status: DC | PRN
  Administered 2021-07-05 – 2021-07-06 (×2): 650 mg via ORAL
  Filled 2021-06-28 (×2): qty 2

## 2021-06-28 MED ORDER — ACETAMINOPHEN 160 MG/5ML PO SOLN: 650.0000 mg | ORAL | Status: DC | PRN

## 2021-06-28 MED ORDER — TICAGRELOR 90 MG PO TABS
90.0000 mg | ORAL_TABLET | Freq: Two times a day (BID) | ORAL | Status: DC
  Administered 2021-06-28 – 2021-07-07 (×18): 90 mg via ORAL
  Filled 2021-06-28 (×18): qty 1

## 2021-06-28 NOTE — Progress Notes (Signed)
Courtney Heys, MD  Physician Physical Medicine and Rehabilitation PMR Pre-admission    Signed Date of Service:  06/27/2021  2:17 PM  Related encounter: ED to Hosp-Admission (Discharged) from 06/24/2021 in [facility]   Signed      Show:Clear all _0 Written_1 Templated_2 Copied  Added by: _3 Cristina Gong, RN_4 Courtney Heys, MD  _5 Hover for details                                                                                                                                                                                                                                                                                                                                                                                                                                        PMR Admission Coordinator Pre-Admission Assessment   Patient: Sandra Becker is an 80 y.o., female MRN: [id number] DOB: [birth year] Height: _6  (165.1 cm) Weight: 73.6 kg   Insurance Information HMO:     PPO:      PCP:      IPA:      80/20:      OTHER:  PRIMARY: Medicare a and b      Policy#: [id number]      Subscriber: pt Benefits:  Phone #: passport one source     Name: 11/30 Eff. Date: 08/29/2005     Deduct: $1556      Out of Pocket Max: none  Life Max: none CIR: 100%      SNF: 20 full days Outpatient: 80%     Co-Pay: 20% Home Health: 100%      Co-Pay: none DME: 80%     Co-Pay: 20% Providers: pt choice  SECONDARY: [employer]      Policy#: [id number]   Financial Counselor:       Phone#:    The "Data Collection Information Summary" for patients in Inpatient Rehabilitation Facilities with attached "Privacy Act Silvana Records" was provided and verbally reviewed with: Patient and Family   Emergency Contact Information Contact Information        Name Relation Home Work Mobile    Banton,Maurice Spouse     [phone]         Current Medical History  Patient Admitting Diagnosis: CVA   History of Present Illness: 80 year old right-handed female with history of hypertension, glaucoma,  hyperlipidemia, breast cancer status post bilateral mastectomy, quit smoking 57 years ago.  Per chart review lives with spouse independent living facility/[facility].  Modified independent prior to admission.  Presented 06/24/2021 with acute onset of right-sided weakness and slurred speech.  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  She was placed on DAPT with aspirin and Plavix.admission chemistry sodium 126 glucose 138, hemoglobin 14.2.  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  MRA of the head showed no intracranial large vessel occlusion or stenosis.  EEG negative for seizure.  Most recent echocardiogram 05/04/2021 ejection fraction 60 to 65% no wall motion abnormalities grade 1 diastolic dysfunction.  Neurology follow-up currently maintained on aspirin 81 mg daily as well as Brilinta 90 mg twice daily x30 days then Plavix alone as well as plan for 30-day cardiac event monitor.  Tolerating a regular diet.  Therapy evaluations completed due to patient's right side weakness and slurred speech was admitted for a comprehensive rehab program.   Complete NIHSS TOTAL: 3   Patient's medical record from [facility] has been reviewed by the rehabilitation admission coordinator and physician.   Past Medical History      Past Medical History:  Diagnosis Date   Breast cancer [facility]) 1993    bilateral mastectomy; no chemo or radiation   Glaucoma     Hyperlipidemia     Hypertension      Has the patient had major surgery during 100 days prior to admission? No   Family History   family history is not on file.   Current  Medications   Current Facility-Administered Medications:    acetaminophen (TYLENOL) tablet 650 mg, 650 mg, Oral, Q4H PRN **OR** acetaminophen (TYLENOL) 160 MG/5ML solution 650 mg, 650 mg, Per Tube, Q4H PRN **OR** acetaminophen (TYLENOL) suppository 650 mg, 650 mg, Rectal, Q4H PRN, Chotiner, Yevonne Aline, MD   aspirin EC tablet 81 mg, 81 mg, Oral, Daily, Chotiner, Yevonne Aline, MD, 81 mg at 06/28/21 0857   brinzolamide (AZOPT) 1 % ophthalmic suspension 1 drop, 1 drop, Both Eyes, BID, Chotiner, Yevonne Aline, MD, 1 drop at 06/28/21 0857   rosuvastatin (CRESTOR) tablet 10 mg, 10 mg, Oral, Daily, Rosalin Hawking, MD, 10 mg at 06/28/21 0857   senna-docusate (Senokot-S) tablet 1 tablet, 1 tablet, Oral, QHS PRN, Chotiner, Yevonne Aline, MD   ticagrelor (BRILINTA) tablet 90 mg, 90 mg, Oral, BID, Rosalin Hawking, MD, 90 mg at 06/28/21 0857   timolol (TIMOPTIC) 0.5 % ophthalmic solution 1 drop, 1 drop, Both Eyes,  BID, Chotiner, Yevonne Aline, MD, 1 drop at 06/28/21 0857   Patients Current Diet:  Diet Order                  Diet regular Room service appropriate? Yes; Fluid consistency: Thin  Diet effective now                       Precautions / Restrictions Precautions Precautions: Fall Precaution Comments: decreased R awareness, pt wears hearing aids but batteries not currently charged per spouse Restrictions Weight Bearing Restrictions: No    Has the patient had 2 or more falls or a fall with injury in the past year? No   Prior Activity Level Community (5-7x/wk): Independent and active   Prior Functional Level Self Care: Did the patient need help bathing, dressing, using the toilet or eating? Independent   Indoor Mobility: Did the patient need assistance with walking from room to room (with or without device)? Independent   Stairs: Did the patient need assistance with internal or external stairs (with or without device)? Independent   Functional Cognition: Did the patient need help planning regular tasks  such as shopping or remembering to take medications? Independent   Patient Information Are you of Hispanic, Latino/a,or Spanish origin?: A. No, not of Hispanic, Latino/a, or Spanish origin What is your race?: A. White Do you need or want an interpreter to communicate with a doctor or health care staff?: 0. No   Patient's Response To:  Health Literacy and Transportation Is the patient able to respond to health literacy and transportation needs?: Yes Health Literacy - How often do you need to have someone help you when you read instructions, pamphlets, or other written material from your doctor or pharmacy?: Never In the past 12 months, has lack of transportation kept you from medical appointments or from getting medications?: No In the past 12 months, has lack of transportation kept you from meetings, work, or from getting things needed for daily living?: No   Home Assistive Devices / Equipment Home Equipment: Conservation officer, nature (2 wheels), Wheelchair - manual (have access to [employer] and WC through [facility])   Prior Device Use: Indicate devices/aids used by the patient prior to current illness, exacerbation or injury? None of the above   Current Functional Level Cognition   Arousal/Alertness: Awake/alert Overall Cognitive Status: Impaired/Different from baseline Current Attention Level: Sustained Orientation Level: Oriented X4 Following Commands: Follows one step commands with increased time Safety/Judgement: Decreased awareness of deficits, Decreased awareness of safety General Comments: R inattention, poor recall of safety cues even with repetition for some tasks Attention: Sustained Sustained Attention: Impaired Sustained Attention Impairment: Verbal basic, Functional basic Memory: Impaired Memory Impairment: Decreased recall of new information, Decreased short term memory, Retrieval deficit Decreased Short Term Memory: Verbal basic, Functional basic Awareness: Impaired Awareness  Impairment: Anticipatory impairment, Intellectual impairment Executive Function: Writer: Impaired Organizing Impairment: Verbal basic, Functional basic Behaviors: Perseveration    Extremity Assessment (includes Sensation/Coordination)   Upper Extremity Assessment: RUE deficits/detail RUE Deficits / Details: apparent motor sensory processing deficits; decreased in-hand manipulation skills RUE Coordination: decreased fine motor  Lower Extremity Assessment: Defer to PT evaluation LLE Deficits / Details: L hip deficits at baseline     ADLs   Overall ADL's : Needs assistance/impaired Eating/Feeding: Set up, Supervision/ safety Grooming: Minimal assistance Upper Body Bathing: Minimal assistance Lower Body Bathing: Moderate assistance, Sit to/from stand Upper Body Dressing : Minimal assistance Lower Body Dressing: Moderate assistance, Sit to/from  stand Toilet Transfer: Minimal assistance Toileting- Clothing Manipulation and Hygiene: Minimal assistance Functional mobility during ADLs: Minimal assistance General ADL Comments: Cues for task completion; ?perseveration during tasks     Mobility   Overal bed mobility: Needs Assistance Bed Mobility: Supine to Sit, Sit to Supine Supine to sit: Min guard Sit to supine: Min assist General bed mobility comments: Pt to long sit unassisted but increased time required to scoot to EOB and cues for sequencing. Pt needs mod cues for technique with return to supine and use of rail     Transfers   Overall transfer level: Needs assistance Equipment used: Rolling walker (2 wheels), None Transfers: Sit to/from Stand Sit to Stand: Min assist, Min guard General transfer comment: poor eccentric control and  decreased problem solving  and awareness of "plopping" when sitting down. Decreased carryover of safety cues even with reciprocal transfers, needs reminders for safe hand placement almost each rep with STS x 5 in a row.     Ambulation /  Gait / Stairs / Wheelchair Mobility   Ambulation/Gait Ambulation/Gait assistance: Min guard, Herbalist (Feet): 125 Feet Assistive device: Rolling walker (2 wheels) Gait Pattern/deviations: Step-through pattern, Decreased stride length, Decreased dorsiflexion - right, Drifts right/left General Gait Details: Cues for wayfinding with ~50% carryover, pt needs dense cues for awareness of RW on R side especially when navigating crowded environment in hospital room. Pt had difficulty problem solving when unable to locate rooms she was encouraged to find, needed reminders to look for wall sign in hallway to locate specific rooms. She was able to recall her room number today. Gait velocity: Decreased     Posture / Balance Dynamic Sitting Balance Sitting balance - Comments: mild posterior LOB while scooting but mostly Supervision for static sitting and weight shifting at EOB Balance Overall balance assessment: Needs assistance Sitting-balance support: Feet supported, No upper extremity supported Sitting balance-Leahy Scale: Fair Sitting balance - Comments: mild posterior LOB while scooting but mostly Supervision for static sitting and weight shifting at EOB Standing balance support: Single extremity supported, During functional activity Standing balance-Leahy Scale: Poor Standing balance comment: Reliant on at least 1 UE support but does better with BUE on RW for dynamic tasks     Special needs/care consideration 30 day monitor recommended as outpatient to rule out AFib Hgb A1c 5.7 Advised to not air travel for the holidays per Neurology for 2 weeks post stroke based on federal aviation recommendations    Previous Home Environment  Living Arrangements: Spouse/significant other  Lives With: Spouse Available Help at Discharge: Available 24 hours/day, Family Type of Home: Independent living facility Home Layout: One level Home Access: Building control surveyor Shower/Tub: Clinical cytogeneticist: Standard Bathroom Accessibility: Yes How Accessible: Accessible via walker, Accessible via wheelchair Home Care Services: No Additional Comments: outpatient. [facility]   Discharge Living Setting Plans for Discharge Living Setting: Patient's home, Lives with (comment), Other (Comment) (spouse) Type of Home at Discharge: [facility] Name at Discharge: [facility] for 4 years Discharge Home Layout: One level Discharge Home Access: Elevator Discharge Bathroom Shower/Tub: Walk-in shower Discharge Bathroom Toilet: Standard Discharge Bathroom Accessibility: Yes How Accessible: Accessible via walker Does the patient have any problems obtaining your medications?: No   Social/Family/Support Systems Patient Roles: Spouse Contact Information: spouse, [name] Anticipated Caregiver: spouse Anticipated Ambulance person Information: see contacts Ability/Limitations of Caregiver: none Caregiver Availability: 24/7 Discharge Plan Discussed with Primary Caregiver: Yes Is Caregiver In  Agreement with Plan?: Yes Does Caregiver/Family have Issues with Lodging/Transportation while Pt is in Rehab?: No   Goals Patient/Family Goal for Rehab: Mod I to supervision with PT, OT and SLP Expected length of stay: ELOS 7 to 10 days Pt/Family Agrees to Admission and willing to participate: Yes Program Orientation Provided & Reviewed with Pt/Caregiver Including Roles  & Responsibilities: Yes   Decrease burden of Care through IP rehab admission: n/a   Possible need for SNF placement upon discharge: not anticipated   Patient Condition: I have reviewed medical records from [facility], spoken with CM, and patient and spouse. I met with patient at the bedside for inpatient rehabilitation assessment.  Patient will benefit from ongoing PT, OT, and SLP, can actively participate in 3 hours of therapy a day 5 days of the week, and  can make measurable gains during the admission.  Patient will also benefit from the coordinated team approach during an Inpatient Acute Rehabilitation admission.  The patient will receive intensive therapy as well as Rehabilitation physician, nursing, social worker, and care management interventions.  Due to bladder management, bowel management, safety, skin/wound care, disease management, medication administration, pain management, and patient education the patient requires 24 hour a day rehabilitation nursing.  The patient is currently min assist overall  with mobility and basic ADLs.  Discharge setting and therapy post discharge at home with outpatient is anticipated.  Patient has agreed to participate in the Acute Inpatient Rehabilitation Program and will admit today.   Preadmission Screen Completed By:  Cleatrice Burke, 06/28/2021 10:24 AM ______________________________________________________________________   Discussed status with Dr. Dagoberto Ligas on 06/28/2021 at 1025 and received approval for admission today.   Admission Coordinator:  Cleatrice Burke, RN, time  [id number] Date 06/28/2021    Assessment/Plan: Diagnosis: Does the need for close, 24 hr/day Medical supervision in concert with the patient's rehab needs make it unreasonable for this patient to be served in a less intensive setting? Yes Co-Morbidities requiring supervision/potential complications: HOH, L basal ganglia infarct- R hemi; glaucoma; HTN; prediabetes; breast CA s/p B/L mastectomy Due to bladder management, bowel management, safety, skin/wound care, disease management, medication administration, pain management, and patient education, does the patient require 24 hr/day rehab nursing? Yes Does the patient require coordinated care of a physician, rehab nurse, PT, OT, and SLP to address physical and functional deficits in the context of the above medical diagnosis(es)? Yes Addressing deficits in the following areas: balance,  endurance, locomotion, strength, transferring, bowel/bladder control, bathing, dressing, feeding, grooming, toileting, cognition, speech, and language Can the patient actively participate in an intensive therapy program of at least 3 hrs of therapy 5 days a week? Yes The potential for patient to make measurable gains while on inpatient rehab is good Anticipated functional outcomes upon discharge from inpatient rehab: modified independent and supervision PT, modified independent and supervision OT, modified independent and supervision SLP Estimated rehab length of stay to reach the above functional goals is: 7-10 days Anticipated discharge destination: Home 10. Overall Rehab/Functional Prognosis: good     MD Signature:           Revision History                               Note Details  Jan Fireman, MD File Time 06/28/2021 10:51 AM  Author Type Physician Status Signed  Last Editor Courtney Heys, MD Service Physical Medicine and [place] # [id number]  Admit Date 06/28/2021

## 2021-06-28 NOTE — Progress Notes (Addendum)
Inpatient Rehabilitation Admission Medication Review by a Pharmacist  A complete drug regimen review was completed for this patient to identify any potential clinically significant medication issues.  High Risk Drug Classes Is patient taking? Indication by Medication  Antipsychotic No   Anticoagulant No   Antibiotic No   Opioid No   Antiplatelet Yes Stroke: Aspirin, ticagrelor  Hypoglycemics/insulin No   Vasoactive Medication No   Chemotherapy No   Other No      Type of Medication Issue Identified Description of Issue Recommendation(s)  Drug Interaction(s) (clinically significant)     Duplicate Therapy     Allergy     No Medication Administration End Date  Added end date to aspirin. DAPT plan for 30 days, then Plavix alone End date added to order  Incorrect Dose     Additional Drug Therapy Needed     Significant med changes from prior encounter (inform family/care partners about these prior to discharge). Chlorthalidone discontinued d/t hyponatremia Do not restart.  Other  PTA MVI, fish oil, vitamin C, Calcium Restart as able    Clinically significant medication issues were identified that warrant physician communication and completion of prescribed/recommended actions by midnight of the next day:  No   Pharmacist comments: Restart vitamins and supplements as patient tolerates. Was on MVI, Calcium, Fish oil, and vitamin C. End date added to aspirin. Valsartan to be resumed as blood pressure allows. Do not restart losartan.  Time spent performing this drug regimen review (minutes):  30   Thank you for allowing [place] to participate in this patients care. Jens Som, PharmD 06/28/2021 3:39 PM  **Pharmacist phone directory can be found on [email or web address] listed under [place]**

## 2021-06-28 NOTE — Progress Notes (Signed)
Physical Therapy Treatment Patient Details Name: Sandra Becker MRN: [id number] DOB: May 05, 1941 Today's Date: 06/28/2021   History of Present Illness Pt is an 80 y/o female admitted 11/27 secondary to speech difficulty and R sided weakness. 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 PMH includes breast cancer s/p bilateral mastectomy, glaucoma, HTN, and TIA.    PT Comments    Pt received in supine, agreeable to therapy session and with good participation in gait training with RW in hallway. Emphasis on wayfinding, environmental awareness and safe posture/RW use. Pt with noted word finding difficulties similar to previous session and unable to verbalize any signs/symptoms of stroke so did review these with her again. Pt continues to benefit from PT services to progress toward functional mobility goals and continue to recommend AIR.   Recommendations for follow up therapy are one component of a multi-disciplinary discharge planning process, led by the attending physician.  Recommendations may be updated based on patient status, additional functional criteria and insurance authorization.  Follow Up Recommendations  Acute inpatient rehab (3hours/day)     Assistance Recommended at Discharge Frequent or constant Supervision/Assistance  Equipment Recommendations  Other (comment) (RW, per eval pt has access to this through [facility]?)    Recommendations for Other Services       Precautions / Restrictions Precautions Precautions: Fall Precaution Comments: decreased R awareness, pt wears hearing aids but batteries not currently charged per spouse Restrictions Weight Bearing Restrictions: No     Mobility  Bed Mobility Overal bed mobility: Needs Assistance Bed Mobility: Supine to Sit;Sit to Supine     Supine to sit: Min guard Sit to supine: Min assist   General bed mobility comments: Pt to long sit unassisted but increased time required to scoot to EOB and cues for sequencing. Pt needs mod  cues for technique with return to supine and use of rail    Transfers Overall transfer level: Needs assistance Equipment used: Rolling walker (2 wheels);None Transfers: Sit to/from Stand Sit to Stand: Min assist           General transfer comment: poor eccentric control and  decreased problem solving  and awareness of "plopping" when sitting down. Ignores cues for safe hand placement or does not register them in time due to slow processing.    Ambulation/Gait Ambulation/Gait assistance: Min assist Gait Distance (Feet): 150 Feet Assistive device: Rolling walker (2 wheels) Gait Pattern/deviations: Step-through pattern;Decreased stride length;Decreased dorsiflexion - right;Drifts right/left Gait velocity: Decreased     General Gait Details: Cues for wayfinding with ~50% carryover, pt needs dense cues for awareness of RW on R side and she tended to veer toward R and bump into items on R when not cued to attend to that side. Pt had difficulty problem solving when unable to locate rooms she was encouraged to find, needed reminders to look for wall sign in hallway to locate specific rooms. Navigated to room 27 and around RN station, then able to find the way back to her room with cues to turn head more to R side when looking for room numbers.    Modified Rankin (Stroke Patients Only) Modified Rankin (Stroke Patients Only) Pre-Morbid Rankin Score: No significant disability Modified Rankin: Moderately severe disability     Balance Overall balance assessment: Needs assistance Sitting-balance support: Feet supported;No upper extremity supported Sitting balance-Leahy Scale: Fair Sitting balance - Comments: mild posterior LOB while scooting but mostly Supervision for static sitting and weight shifting at EOB   Standing balance support: Single extremity supported;During  functional activity Standing balance-Leahy Scale: Poor Standing balance comment: Reliant on at least 1 UE support but  does better with BUE on RW for dynamic tasks                            Cognition Arousal/Alertness: Awake/alert Behavior During Therapy: Flat affect Overall Cognitive Status: Impaired/Different from baseline Area of Impairment: Problem solving;Following commands;Safety/judgement;Attention;Awareness;Memory                   Current Attention Level: Sustained Memory: Decreased short-term memory Following Commands: Follows one step commands with increased time Safety/Judgement: Decreased awareness of deficits;Decreased awareness of safety Awareness: Emergent Problem Solving: Slow processing;Difficulty sequencing;Requires verbal cues;Decreased initiation General Comments: R inattention. difficulty with wayfinding, able to state city but can't state specific location/hospital even with hints.           General Comments General comments (skin integrity, edema, etc.): VSS per chart review, no acute s/sx distress, pt reports only minimal fatigue with exertion.      Pertinent Vitals/Pain Pain Assessment: No/denies pain    Home Living Family/patient expects to be discharged to:: Private residence Living Arrangements: Spouse/significant other                 PT Goals (current goals can now be found in the care plan section) Acute Rehab PT Goals PT Goal Formulation: With patient/family Time For Goal Achievement: 07/09/21 Progress towards PT goals: Progressing toward goals    Frequency    Min 4X/week      PT Plan Current plan remains appropriate       AM-PAC PT "6 Clicks" Mobility   Outcome Measure  Help needed turning from your back to your side while in a flat bed without using bedrails?: A Little Help needed moving from lying on your back to sitting on the side of a flat bed without using bedrails?: A Little Help needed moving to and from a bed to a chair (including a wheelchair)?: A Lot (mod cues for this and all items below) Help needed standing  up from a chair using your arms (e.g., wheelchair or bedside chair)?: A Lot Help needed to walk in hospital room?: A Lot Help needed climbing 3-5 steps with a railing? : A Lot 6 Click Score: 14    End of Session Equipment Utilized During Treatment: Gait belt Activity Tolerance: Patient tolerated treatment well Patient left: in bed;with call bell/phone within reach;with bed alarm set Nurse Communication: Mobility status PT Visit Diagnosis: Unsteadiness on feet (R26.81);Muscle weakness (generalized) (M62.81);Other symptoms and signs involving the nervous system (R29.898)     Time: 7591-6384 PT Time Calculation (min) (ACUTE ONLY): 19 min  Charges:  $Gait Training: 8-22 mins                     Tallan Sandoz P., PTA Acute Rehabilitation Services Pager: [phone] Office: [place] 06/28/2021, 4:11 PM

## 2021-06-28 NOTE — Plan of Care (Signed)
Pt is alert oriented x 4. Ambulatory with +1 assist. Pt denies pain. Pt has right sided weakness.   Problem: Education: Goal: Knowledge of General Education information will improve Description: Including pain rating scale, medication(s)/side effects and non-pharmacologic comfort measures Outcome: Progressing   Problem: Health Behavior/Discharge Planning: Goal: Ability to manage health-related needs will improve Outcome: Progressing   Problem: Clinical Measurements: Goal: Ability to maintain clinical measurements within normal limits will improve Outcome: Progressing Goal: Will remain free from infection Outcome: Progressing Goal: Diagnostic test results will improve Outcome: Progressing Goal: Respiratory complications will improve Outcome: Progressing Goal: Cardiovascular complication will be avoided Outcome: Progressing   Problem: Activity: Goal: Risk for activity intolerance will decrease Outcome: Progressing   Problem: Nutrition: Goal: Adequate nutrition will be maintained Outcome: Progressing   Problem: Coping: Goal: Level of anxiety will decrease Outcome: Progressing   Problem: Elimination: Goal: Will not experience complications related to bowel motility Outcome: Progressing Goal: Will not experience complications related to urinary retention Outcome: Progressing   Problem: Pain Managment: Goal: General experience of comfort will improve Outcome: Progressing   Problem: Safety: Goal: Ability to remain free from injury will improve Outcome: Progressing   Problem: Skin Integrity: Goal: Risk for impaired skin integrity will decrease Outcome: Progressing

## 2021-06-28 NOTE — Discharge Summary (Signed)
Physician Discharge Summary  Sandra Becker RKY:[id number] DOB: November 16, 1940 DOA: 06/24/2021  PCP: Kelton Pillar, MD  Admit date: 06/24/2021 Discharge date: 06/28/2021  Admitted From: home Disposition:  CIR  Recommendations for Outpatient Follow-up:  Follow up with PCP, Neurology in 1-2 weeks after rehab [place]: none Equipment/Devices: none  Discharge Condition: stable CODE STATUS: Full code Diet recommendation: heart healthy  HPI: Per admitting MD, Sandra Becker is a 80 y.o. female with medical history significant for HTN, glaucoma, TIA, HLD, breast cancer s/p bilateral mastectomy presents for dilation of sudden onset of slurred speech with right-sided weakness.  She reports that she was sitting at home with her husband when she developed slurred speech that was difficult for her to get the words out and she had some right-sided weakness.  Husband called 61.  Symptoms resolved while in the ambulance about 30 minutes after they started.  She denies having any headache, chest pain, palpitations, nausea vomiting diarrhea or abdominal pain.  She was not doing any type of exertion when the symptoms began.  She has not had any change in her diet.  He denies any swelling of her legs.  She been taking her medications as prescribed.  Symptoms are completely resolved at this time.  She denies any visual change with the symptoms. Lives with her husband.  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  An MRI of the head showed mild diffuse narrowing of the left P2 segment.  2D echo was done and showed EF of 60-65%, aortic valve was mild-moderately calcified.  EEG was done it was normal without  seizure or epileptiform discharges.  Neurology recommends 30-day cardiac monitor as an outpatient to rule out A. Fib, cardiology contacted prior to patient's discharge.  LDL was 69, A1c was 6.0.  Neurology now recommends aspirin 81 mg and Brilinta 90 mg twice daily for 30 days then continue Plavix alone starting Jul 25 2021.  Therapy is recommended CIR, she is medically stable for transfer today   Active Problems Prediabetes-A1c 5.7 Essential hypertension-stop chlorthalidone due to hyponatremia, at home she is on valsartan 325 mg, no longer taking losartan. Gradually resume in 2 to 3 days based on BP trends. Hyperlipidemia-increase Crestor to 10 mg Hyponatremia-acute on chronic, stop chlorthalidone.  Sepsis ruled out   Discharge Instructions  Discharge Instructions     Ambulatory referral to Neurology   Complete by: As directed    Follow up with stroke clinic NP (Jessica Vanschaick or Cecille Rubin, if both not available, consider Zachery Dauer, or [place]) at [facility] in about 4 weeks. Thanks.      Allergies as of 06/28/2021       Reactions   Crab [shellfish Allergy] Other (See Comments)   From skin test   Fosamax [alendronate Sodium] Other (See Comments)   Difficult swallowing   Hydrochlorothiazide    Other reaction(s): hyponatremia   Raloxifene    Other reaction(s): Unknown   Ace Inhibitors Cough        Medication List     STOP taking these medications    chlorthalidone 25 MG tablet Commonly known as: HYGROTON   losartan 50 MG tablet Commonly known as: COZAAR   Moderna COVID-19 Vaccine 100 MCG/0.5ML injection Generic drug: COVID-19 mRNA vaccine (Moderna)  TAKE these medications    Aspirin Low Dose 81 MG EC tablet Generic drug: aspirin Take 1 tablet (81 mg total) by mouth daily. Swallow whole.   brinzolamide 1 % ophthalmic suspension Commonly known as: AZOPT Place 1 drop into both eyes 2 (two) times daily.   CALCIUM 500 PO Take 1 tablet by mouth  daily.   Fish Oil 1000 MG Cpdr Take 1,000 mg by mouth daily.   multivitamin tablet Take 1 tablet by mouth daily.   rosuvastatin 10 MG tablet Commonly known as: CRESTOR Take 1 tablet (10 mg total) by mouth daily. Start taking on: June 29, 2021 What changed:  medication strength how much to take when to take this   ticagrelor 90 MG Tabs tablet Commonly known as: BRILINTA Take 1 tablet (90 mg total) by mouth 2 (two) times daily.   timolol 0.5 % ophthalmic solution Commonly known as: TIMOPTIC Place 1 drop into both eyes 2 (two) times daily.   valsartan 320 MG tablet Commonly known as: DIOVAN Take 1 tablet (320 mg total) by mouth daily. Start taking on: June 30, 2021 What changed: These instructions start on June 30, 2021. If you are unsure what to do until then, ask your doctor or other care provider.   vitamin C 500 MG tablet Commonly known as: ASCORBIC ACID Take 500 mg by mouth daily.        Follow-up Information     [facility]. Schedule an appointment as soon as possible for a visit in 1 month(s).   Specialty: Neurology Why: stroke clinic Contact information: [address] [phone]               Consultations: Neurology   Procedures/Studies:  MR ANGIO HEAD WO CONTRAST  Result Date: 06/25/2021 CLINICAL DATA:  Confusion, right facial droop, right-sided weakness, slurred speech, stroke suspected EXAM: MRI HEAD WITHOUT CONTRAST MRA HEAD WITHOUT CONTRAST TECHNIQUE: Multiplanar, multi-echo pulse sequences of the brain and surrounding structures were acquired without intravenous contrast. Angiographic images of the Circle of Willis were acquired using MRA technique without intravenous contrast. COMPARISON:  05/03/2021 MRI head, correlation is also made with 05/03/2021 CTA head FINDINGS: MRI HEAD FINDINGS Brain: Restricted diffusion with ADC correlate in the left basal ganglia (series  2, images 29-32), likely acute infarct. No acute hemorrhage, mass, mass effect, or midline shift. No extra-axial collection or hydrocephalus. Confluent T2 hyperintense signal in the periventricular white matter and pons, likely the sequela of severe chronic small vessel ischemic disease. 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 Vascular: Please see MRA findings below Skull and upper cervical spine: Normal marrow signal. Sinuses/Orbits: No acute or significant finding. Status post bilateral lens replacements. Other: The mastoids are well aerated. MRA HEAD FINDINGS Anterior circulation: Both internal carotid arteries are patent to the termini, with mild narrowing in the right supraclinoid segment but without significant stenosis or other abnormality. A1 segments patent. Normal anterior communicating artery. Anterior cerebral arteries are patent to their distal aspects. No M1 stenosis or occlusion. Normal MCA bifurcations. Distal MCA branches perfused and symmetric. Posterior circulation: Vertebral arteries patent to the vertebrobasilar junction without stenosis. Basilar patent to its distal aspect. Superior cerebellar arteries patent bilaterally. PCAs perfused to their distal aspects without focal stenosis, although the left proximal P2 is diminutive, likely diffuse narrowing. The bilateral posterior communicating arteries are not visualized. Anatomic variants: None significant IMPRESSION: 1. 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 2.  No intracranial large vessel occlusion or severe stenosis. Mild diffuse narrowing in the left P2 segment. These results were called by telephone at the time of interpretation on 06/25/2021 at 2:47 am to provider [facility] , who verbally acknowledged these results. Electronically Signed   By: Merilyn Baba M.D.   On: 06/25/2021 02:48   MR BRAIN WO CONTRAST  Result Date: 06/25/2021 CLINICAL DATA:  Confusion, right facial  droop, right-sided weakness, slurred speech, stroke suspected EXAM: MRI HEAD WITHOUT CONTRAST MRA HEAD WITHOUT CONTRAST TECHNIQUE: Multiplanar, multi-echo pulse sequences of the brain and surrounding structures were acquired without intravenous contrast. Angiographic images of the Circle of Willis were acquired using MRA technique without intravenous contrast. COMPARISON:  05/03/2021 MRI head, correlation is also made with 05/03/2021 CTA head FINDINGS: MRI HEAD FINDINGS Brain: Restricted diffusion with ADC correlate in the left basal ganglia (series 2, images 29-32), likely acute infarct. No acute hemorrhage, mass, mass effect, or midline shift. No extra-axial collection or hydrocephalus. Confluent T2 hyperintense signal in the periventricular white matter and pons, likely the sequela of severe chronic small vessel ischemic disease. 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 Vascular: Please see MRA findings below Skull and upper cervical spine: Normal marrow signal. Sinuses/Orbits: No acute or significant finding. Status post bilateral lens replacements. Other: The mastoids are well aerated. MRA HEAD FINDINGS Anterior circulation: Both internal carotid arteries are patent to the termini, with mild narrowing in the right supraclinoid segment but without significant stenosis or other abnormality. A1 segments patent. Normal anterior communicating artery. Anterior cerebral arteries are patent to their distal aspects. No M1 stenosis or occlusion. Normal MCA bifurcations. Distal MCA branches perfused and symmetric. Posterior circulation: Vertebral arteries patent to the vertebrobasilar junction without stenosis. Basilar patent to its distal aspect. Superior cerebellar arteries patent bilaterally. PCAs perfused to their distal aspects without focal stenosis, although the left proximal P2 is diminutive, likely diffuse narrowing. The bilateral posterior communicating  arteries are not visualized. Anatomic variants: None significant IMPRESSION: 1. 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 2. No intracranial large vessel occlusion or severe stenosis. Mild diffuse narrowing in the left P2 segment. These results were called by telephone at the time of interpretation on 06/25/2021 at 2:47 am to provider [facility] , who verbally acknowledged these results. Electronically Signed   By: Merilyn Baba M.D.   On: 06/25/2021 02:48   EEG adult  Result Date: 06/25/2021 Lora Havens, MD     06/25/2021 12:56 PM Patient Name: ZAHRIYAH JOO MRN: [id number] Epilepsy Attending: Lora Havens Referring Physician/Provider: Dr Donnetta Simpers Date: 06/25/2021 Duration: 23.06 mins Patient history: 80 year old female with right upper extremity weakness and word finding difficulty, similar episode in October 2022.  EEG evaluate for seizure. Level of alertness: Awake, asleep AEDs during EEG study: None Technical aspects: This EEG study was done with scalp electrodes positioned according to the 10-20 International system of electrode placement. Electrical activity was acquired at a sampling rate of 500Hz  and reviewed with a high frequency filter of 70Hz  and a low frequency filter of 1Hz . EEG data were recorded continuously and digitally stored. Description: The posterior dominant rhythm consists of 8 Hz activity of moderate voltage (25-35 uV) seen predominantly in posterior head regions, symmetric and reactive to eye opening and eye closing. Sleep was characterized by vertex waves, sleep spindles (12 to 14 Hz), maximal frontocentral region. Hyperventilation and photic stimulation were not performed.   IMPRESSION: This study is within  normal limits. No seizures or epileptiform discharges were seen throughout the recording. Lora Havens   CT HEAD CODE STROKE WO CONTRAST  Result Date: 06/24/2021 CLINICAL DATA:  Code stroke. Neuro deficit, acute, stroke suspected. Facial droop,  slurred speech, right-sided weakness. EXAM: CT HEAD WITHOUT CONTRAST TECHNIQUE: Contiguous axial images were obtained from the base of the skull through the vertex without intravenous contrast. COMPARISON:  Brain MRI 05/03/2021. CT angiogram head/neck 05/03/2021. Head CT 05/03/2021. FINDINGS: Brain: Mild generalized cerebral atrophy. 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 Background moderate/advanced patchy and ill-defined hypoattenuation within the cerebral white matter, nonspecific but compatible chronic small vessel ischemic disease. There is no acute intracranial hemorrhage. No demarcated cortical infarct. No extra-axial fluid collection. No evidence of an intracranial mass. No midline shift. Vascular: No hyperdense vessel.  Atherosclerotic calcifications. Skull: Normal. Negative for fracture or focal lesion. Sinuses/Orbits: Visualized orbits show no acute finding. Mild mucosal thickening within the bilateral ethmoid air cells. Chronic medially displaced fracture deformity of the right lamina papyracea. ASPECTS ([place] Stroke Program Early CT Score) - Ganglionic level infarction (caudate, lentiform nuclei, internal capsule, insula, M1-M3 cortex): 7 - Supraganglionic infarction (M4-M6 cortex): 3 Total score (0-10 with 10 being normal): 10 These results were called by telephone at the time of interpretation on 06/24/2021 at 6:51 pm to provider Dr. Quinn Axe, who verbally acknowledged these results. IMPRESSION: No evidence of acute intracranial abnormality. Redemonstrated chronic lacunar infarcts within the bilateral deep gray nuclei and right cerebellar hemisphere. Moderate/advanced chronic small vessel ischemic changes within the cerebral white matter. Mild generalized cerebral atrophy. Electronically Signed   By: Kellie Simmering D.O.   On: 06/24/2021 18:53     Subjective: - no chest pain, shortness of breath, no abdominal pain, nausea or  vomiting.   Discharge Exam: BP (!) 155/73 (BP Location: Left Arm)   Pulse 80   Temp 97.9 F (36.6 C) (Oral)   Resp 17   Ht 5\' 5"  (1.651 m)   Wt 73.6 kg   SpO2 100%   BMI 27.00 kg/m   General: Pt is alert, awake, not in acute distress Cardiovascular: RRR, S1/S2 +, no rubs, no gallops Respiratory: CTA bilaterally, no wheezing, no rhonchi Abdominal: Soft, NT, ND, bowel sounds + Extremities: no edema, no cyanosis  The results of significant diagnostics from this hospitalization (including imaging, microbiology, ancillary and laboratory) are listed below for reference.     Microbiology: Recent Results (from the past 240 hour(s))  Resp Panel by RT-PCR (Flu A&B, Covid) Nasopharyngeal Swab     Status: None   Collection Time: 06/24/21 11:48 PM   Specimen: Nasopharyngeal Swab; Nasopharyngeal(NP) swabs in vial transport medium  Result Value Ref Range Status   SARS Coronavirus 2 by RT PCR NEGATIVE NEGATIVE Final    Comment: (NOTE) SARS-CoV-2 target nucleic acids are NOT DETECTED.  The SARS-CoV-2 RNA is generally detectable in upper respiratory specimens during the acute phase of infection. The lowest concentration of SARS-CoV-2 viral copies this assay can detect is 138 copies/mL. A negative result does not preclude SARS-Cov-2 infection and should not be used as the sole basis for treatment or other patient management decisions. A negative result may occur with  improper specimen collection/handling, submission of specimen other than nasopharyngeal swab, presence of viral mutation(s) within the areas targeted by this assay, and inadequate number of viral copies(<138 copies/mL). A negative result must be combined with clinical observations, patient history, and epidemiological information. The expected result is Negative.  Fact Sheet for Patients:  EntrepreneurPulse.com.au  Fact Sheet for Healthcare Providers:  IncredibleEmployment.be  This  test is no t yet approved or cleared by the [place] FDA and  has been authorized for detection and/or diagnosis of SARS-CoV-2 by FDA under an Emergency Use Authorization (EUA). This EUA will remain  in effect (meaning this test can be used) for the duration of the COVID-19 declaration under Section 564(b)(1) of the Act, 21 U.S.C.section 360bbb-3(b)(1), unless the authorization is terminated  or revoked sooner.       Influenza A by PCR NEGATIVE NEGATIVE Final   Influenza B by PCR NEGATIVE NEGATIVE Final    Comment: (NOTE) The Xpert Xpress SARS-CoV-2/FLU/RSV plus assay is intended as an aid in the diagnosis of influenza from Nasopharyngeal swab specimens and should not be used as a sole basis for treatment. Nasal washings and aspirates are unacceptable for Xpert Xpress SARS-CoV-2/FLU/RSV testing.  Fact Sheet for Patients: EntrepreneurPulse.com.au  Fact Sheet for Healthcare Providers: IncredibleEmployment.be  This test is not yet approved or cleared by the [place] FDA and has been authorized for detection and/or diagnosis of SARS-CoV-2 by FDA under an Emergency Use Authorization (EUA). This EUA will remain in effect (meaning this test can be used) for the duration of the COVID-19 declaration under Section 564(b)(1) of the Act, 21 U.S.C. section 360bbb-3(b)(1), unless the authorization is terminated or revoked.  Performed at [facility], [place] [address], [place], [place] [address]      Labs: Basic Metabolic Panel: Recent Labs  Lab 06/25/21 0739 06/25/21 1403 06/26/21 0416 06/27/21 0056 06/28/21 0131  NA 127* 130* 131* 129* 129*  K 3.8 4.2 3.6 3.4* 3.6  CL 92* 93* 99 97* 97*  CO2 24 23 25 24 25   GLUCOSE 115* 114* 113* 118* 114*  BUN 11 12 11 8 15   CREATININE 0.52 0.62 0.53 0.53 0.61  CALCIUM 9.5 9.6 8.9 8.6* 8.9  MG 1.8 1.8 1.8 2.0 2.1  PHOS 4.4 4.3 3.0 2.5 3.4   Liver Function Tests: Recent Labs  Lab  06/25/21 0739 06/25/21 1403 06/26/21 0416 06/27/21 0056 06/28/21 0131  AST 38 39 34 29 26  ALT 58* 62* 53* 49* 41  ALKPHOS 50 52 45 46 45  BILITOT 0.7 0.8 0.7 0.7 0.8  PROT 6.6 6.8 6.2* 6.4* 6.5  ALBUMIN 3.9 4.2 3.6 3.7 3.6   CBC: Recent Labs  Lab 06/25/21 0739 06/25/21 1403 06/26/21 0416 06/27/21 0056 06/28/21 0131  WBC 9.1 9.3 8.0 8.9 10.6*  NEUTROABS 5.5 5.5 4.8 5.3 6.6  HGB 14.4 14.0 12.7 13.5 13.6  HCT 43.5 40.9 37.6 39.4 39.9  MCV 90.6 89.1 87.4 88.7 88.5  PLT 367 405* 363 355 366   CBG: Recent Labs  Lab 06/24/21 1835  GLUCAP 139*   Hgb A1c Recent Labs    06/25/21 1403  HGBA1C 5.7*   Lipid Profile Recent Labs    06/25/21 1403  CHOL 183  HDL 87  LDLCALC 75  TRIG 104  CHOLHDL 2.1   Thyroid function studies Recent Labs    06/25/21 1403  TSH 1.117   Urinalysis    Component Value Date/Time   COLORURINE STRAW (A) 05/03/2021 1423   APPEARANCEUR CLEAR 05/03/2021 1423   LABSPEC 1.016 05/03/2021 1423   PHURINE 8.0 05/03/2021 [place] 05/03/2021 1423   HGBUR NEGATIVE 05/03/2021 [place] 05/03/2021 1423   KETONESUR NEGATIVE 05/03/2021 1423   PROTEINUR NEGATIVE 05/03/2021 1423   UROBILINOGEN 0.2  04/27/2011 2131   NITRITE NEGATIVE 05/03/2021 1423   LEUKOCYTESUR NEGATIVE 05/03/2021 1423    FURTHER DISCHARGE INSTRUCTIONS:   Get Medicines reviewed and adjusted: Please take all your medications with you for your next visit with your Primary MD   Laboratory/radiological data: Please request your Primary MD to go over all hospital tests and procedure/radiological results at the follow up, please ask your Primary MD to get all Hospital records sent to his/her office.   In some cases, they will be blood work, cultures and biopsy results pending at the time of your discharge. Please request that your primary care M.D. goes through all the records of your hospital data and follows up on these results.   Also Note the  following: If you experience worsening of your admission symptoms, develop shortness of breath, life threatening emergency, suicidal or homicidal thoughts you must seek medical attention immediately by calling 911 or calling your MD immediately  if symptoms less severe.   You must read complete instructions/literature along with all the possible adverse reactions/side effects for all the Medicines you take and that have been prescribed to you. Take any new Medicines after you have completely understood and accpet all the possible adverse reactions/side effects.    Do not drive when taking Pain medications or sleeping medications (Benzodaizepines)   Do not take more than prescribed Pain, Sleep and Anxiety Medications. It is not advisable to combine anxiety,sleep and pain medications without talking with your primary care practitioner   Special Instructions: If you have smoked or chewed Tobacco  in the last 2 yrs please stop smoking, stop any regular Alcohol  and or any Recreational drug use.   Wear Seat belts while driving.   Please note: You were cared for by a hospitalist during your hospital stay. Once you are discharged, your primary care physician will handle any further medical issues. Please note that NO REFILLS for any discharge medications will be authorized once you are discharged, as it is imperative that you return to your primary care physician (or establish a relationship with a primary care physician if you do not have one) for your post hospital discharge needs so that they can reassess your need for medications and monitor your lab values.  Time coordinating discharge: 35 minutes  SIGNED:  Marzetta Board, MD, PhD 06/28/2021, 10:45 AM

## 2021-06-28 NOTE — TOC Transition Note (Signed)
Transition of Care [facility]) - CM/SW Discharge Note   Patient Details  Name: Sandra Becker MRN: [id number] Date of Birth: August 17, 1940  [facility] CM/SW Contact:  Pollie Friar, RN Phone Number: 06/28/2021, 10:38 AM   Clinical Narrative:    Patient is discharging to CIR today. CM signing off.    Final next level of care: IP Rehab Facility Barriers to Discharge: No Barriers Identified   Patient Goals and CMS Choice     Choice offered to / list presented to : Patient  Discharge Placement                       Discharge Plan and Services                                     Social Determinants of Health (SDOH) Interventions     Readmission Risk Interventions No flowsheet data found.

## 2021-06-28 NOTE — H&P (Signed)
Physical Medicine and Rehabilitation Admission H&P        Chief Complaint  Patient presents with   Code Stroke  : HPI: Sandra Becker is an 80 year old right-handed female with history of hypertension, glaucoma,  hyperlipidemia, breast cancer status post bilateral mastectomy, quit smoking 57 years ago.  Per chart review lives with spouse independent living facility/[facility].She is a retired Chief Executive Officer . Modified independent prior to admission.  Presented 06/24/2021 with acute onset of right-sided weakness and slurred speech.  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  She was placed on DAPT with aspirin and Plavix.admission chemistry sodium 126 glucose 138, hemoglobin 14.2.  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  MRA of the head showed no intracranial large vessel occlusion or stenosis.  EEG negative for seizure.  Most recent echocardiogram 05/04/2021 ejection fraction 60 to 65% no wall motion abnormalities grade 1 diastolic dysfunction.  Neurology follow-up currently maintained on aspirin 81 mg daily as well as Brilinta 90 mg twice daily x30 days then Plavix alone as well as plan for 30-day cardiac event monitor.  Tolerating a regular diet.  Therapy evaluations completed due to patient's right side weakness and slurred speech was admitted for a comprehensive rehab program. Pt reports touble coming up with words- can't get the right words out.  LBM yesterday- denies pain; peeing OK.      Review of Systems  Constitutional:  Negative for chills and fever.  HENT:  Negative for hearing loss.   Eyes:  Negative for blurred vision and double vision.  Respiratory:  Negative for cough and shortness of breath.   Cardiovascular:  Negative for chest pain, palpitations and leg swelling.  Gastrointestinal:  Positive for constipation. Negative for heartburn, nausea and vomiting.   Genitourinary:  Negative for dysuria, flank pain and hematuria.  Musculoskeletal:  Positive for back pain, joint pain and myalgias.  Skin:  Negative for rash.  Neurological:  Positive for speech change and weakness.  All other systems reviewed and are negative.     Past Medical History:  Diagnosis Date   Breast cancer ([place]) 1993    bilateral mastectomy; no chemo or radiation   Glaucoma     Hyperlipidemia     Hypertension           Past Surgical History:  Procedure Laterality Date   MASTECTOMY Bilateral 1993   TONSILLECTOMY AND ADENOIDECTOMY   1947   TUBAL LIGATION   1974         Family History  Problem Relation Age of Onset   Colon cancer Neg Hx      Social History:  reports that she quit smoking about 57 years ago. She has never used smokeless tobacco. She reports current alcohol use of about 21.0 standard drinks per week. She reports that she does not use drugs. Allergies:       Allergies  Allergen Reactions   Crab [Shellfish Allergy] Other (See Comments)      From skin test   Fosamax [Alendronate Sodium] Other (See Comments)      Difficult swallowing   Hydrochlorothiazide        Other reaction(s): hyponatremia   Raloxifene        Other reaction(s): Unknown   Ace Inhibitors Cough          Medications Prior to Admission  Medication Sig Dispense Refill   aspirin 81 MG EC tablet  Take 1 tablet (81 mg total) by mouth daily. Swallow whole. 30 tablet 11   brinzolamide (AZOPT) 1 % ophthalmic suspension Place 1 drop into both eyes 2 (two) times daily.        Calcium Carbonate (CALCIUM 500 PO) Take 1 tablet by mouth daily.       chlorthalidone (HYGROTON) 25 MG tablet Take 25 mg by mouth daily.       Multiple Vitamin (MULTIVITAMIN) tablet Take 1 tablet by mouth daily.       Omega-3 Fatty Acids (FISH OIL) 1000 MG CPDR Take 1,000 mg by mouth daily.       rosuvastatin (CRESTOR) 5 MG tablet Take 5 mg by mouth at bedtime.       timolol (TIMOPTIC) 0.5 % ophthalmic solution Place  1 drop into both eyes 2 (two) times daily.       valsartan (DIOVAN) 320 MG tablet Take 320 mg by mouth daily.       vitamin C (ASCORBIC ACID) 500 MG tablet Take 500 mg by mouth daily.       COVID-19 mRNA vaccine, Moderna, 100 MCG/0.5ML injection Inject into the muscle. 0.25 mL 0   losartan (COZAAR) 50 MG tablet Take 50 mg by mouth daily. (Patient not taking: Reported on 06/25/2021)          Drug Regimen Review Drug regimen was reviewed and remains appropriate with no significant issues identified   Home: Home Living Family/patient expects to be discharged to:: Private residence ([place]) Living Arrangements: Spouse/significant other Available Help at Discharge: Available 24 hours/day, Family Type of Home: Independent living facility Home Access: Elevator Home Layout: One level Bathroom Shower/Tub: Multimedia programmer: Standard Bathroom Accessibility: Yes Home Equipment: Conservation officer, nature (2 wheels), Wheelchair - manual (have access to [employer] and WC through [facility]) Additional Comments: outpatient. Lives ILF at [facility] for 4 years  Lives With: Spouse   Functional History: Prior Function Prior Level of Function : Independent/Modified Independent Mobility Comments: was going ot outpt PT; driving; enjoys water aerobics   Functional Status:  Mobility: Bed Mobility Overal bed mobility: Needs Assistance Bed Mobility: Supine to Sit, Sit to Supine Supine to sit: Min guard Sit to supine: Min assist General bed mobility comments: Pt to long sit unassisted but increased time required to scoot to EOB and cues for sequencing. Pt needs mod cues for technique with return to supine and use of rail Transfers Overall transfer level: Needs assistance Equipment used: Rolling walker (2 wheels), None Transfers: Sit to/from Stand Sit to Stand: Min assist, Min guard General transfer comment: poor eccentric control and  decreased problem solving  and awareness of "plopping"  when sitting down. Decreased carryover of safety cues even with reciprocal transfers, needs reminders for safe hand placement almost each rep with STS x 5 in a row. Ambulation/Gait Ambulation/Gait assistance: Min guard, Min assist Gait Distance (Feet): 125 Feet Assistive device: Rolling walker (2 wheels) Gait Pattern/deviations: Step-through pattern, Decreased stride length, Decreased dorsiflexion - right, Drifts right/left General Gait Details: Cues for wayfinding with ~50% carryover, pt needs dense cues for awareness of RW on R side especially when navigating crowded environment in hospital room. Pt had difficulty problem solving when unable to locate rooms she was encouraged to find, needed reminders to look for wall sign in hallway to locate specific rooms. She was able to recall her room number today. Gait velocity: Decreased   ADL: ADL Overall ADL's : Needs assistance/impaired Eating/Feeding: Set up, Supervision/ safety Grooming: Minimal assistance Upper  Body Bathing: Minimal assistance Lower Body Bathing: Moderate assistance, Sit to/from stand Upper Body Dressing : Minimal assistance Lower Body Dressing: Moderate assistance, Sit to/from stand Toilet Transfer: Minimal assistance Toileting- Clothing Manipulation and Hygiene: Minimal assistance Functional mobility during ADLs: Minimal assistance General ADL Comments: Cues for task completion; ?perseveration during tasks   Cognition: Cognition Overall Cognitive Status: Impaired/Different from baseline Arousal/Alertness: Awake/alert Orientation Level: Oriented X4 Year: 2022 Month: November Day of Week: Correct Attention: Sustained Sustained Attention: Impaired Sustained Attention Impairment: Verbal basic, Functional basic Memory: Impaired Memory Impairment: Decreased recall of new information, Decreased short term memory, Retrieval deficit Decreased Short Term Memory: Verbal basic, Functional basic Immediate Memory Recall:  Sock, Blue, Bed Memory Recall Sock: Without Cue Memory Recall Blue: With Cue Memory Recall Bed: With Cue Awareness: Impaired Awareness Impairment: Anticipatory impairment, Intellectual impairment Executive Function: Writer: Impaired Organizing Impairment: Verbal basic, Functional basic Behaviors: Perseveration Cognition Arousal/Alertness: Awake/alert Behavior During Therapy: Flat affect Overall Cognitive Status: Impaired/Different from baseline Area of Impairment: Problem solving, Following commands, Safety/judgement, Attention, Awareness, Memory Current Attention Level: Sustained Memory: Decreased short-term memory Following Commands: Follows one step commands with increased time Safety/Judgement: Decreased awareness of deficits, Decreased awareness of safety Awareness: Emergent Problem Solving: Slow processing, Difficulty sequencing, Requires verbal cues, Decreased initiation General Comments: R inattention, poor recall of safety cues even with repetition for some tasks   Physical Exam: Blood pressure 129/66, pulse 71, temperature 97.7 F (36.5 C), temperature source Oral, resp. rate 16, height 5\' 5"  (1.651 m), weight 73.6 kg, SpO2 98 %. Physical Exam Vitals and nursing note reviewed.  Constitutional:      Comments: Awake, alert, appropriate, word finding issues; sitting up in bed reading, NAD  HENT:     Head: Normocephalic and atraumatic.     Comments: R facial droop- tongue midline; facial sensation intact    Right Ear: External ear normal.     Left Ear: External ear normal.     Nose: Nose normal. No congestion.     Mouth/Throat:     Mouth: Mucous membranes are dry.     Pharynx: Oropharynx is clear. No oropharyngeal exudate.  Eyes:     General:        Right eye: No discharge.        Left eye: No discharge.     Extraocular Movements: Extraocular movements intact.     Comments: No nystagmus  Cardiovascular:     Rate and Rhythm: Normal rate and regular  rhythm.     Heart sounds: Normal heart sounds. No murmur heard.   No gallop.  Pulmonary:     Effort: Pulmonary effort is normal. No respiratory distress.     Breath sounds: Normal breath sounds. No wheezing, rhonchi or rales.  Abdominal:     Comments: Soft, NT, ND, (+)BS    Musculoskeletal:     Cervical back: Normal range of motion. No rigidity.     Comments: LUE/LLE- 5/5 RUE- 5-/5 in biceps/triceps/WR, grip and FA RLE_ 4+/5 in HF/KE/KF/DF and PF  Skin:    General: Skin is warm and dry.     Comments: R hand IV- looks OK- old IV site L forearm- healing well   Neurological:     Comments: Patient is alert.  Makes eye contact with examiner.  Mild expressive aphasia however she was able to provide her name place and her name objects.  She follows simple commands. 3/3 naming with cues for 1 object Named 5 animals in 30 seconds, but repeated 2 (  dogs/puppies/cats/kittens) Decreased coordination on RUE Finger to nose slightly slowed, but intact B/L R inattention noted  Psychiatric:     Comments: Flat; appropriate      Lab Results Last 48 Hours        Results for orders placed or performed during the hospital encounter of 06/24/21 (from the past 48 hour(s))  Comprehensive metabolic panel     Status: Abnormal    Collection Time: 06/27/21 12:56 AM  Result Value Ref Range    Sodium 129 (L) 135 - 145 mmol/L    Potassium 3.4 (L) 3.5 - 5.1 mmol/L    Chloride 97 (L) 98 - 111 mmol/L    CO2 24 22 - 32 mmol/L    Glucose, Bld 118 (H) 70 - 99 mg/dL      Comment: Glucose reference range applies only to samples taken after fasting for at least 8 hours.    BUN 8 8 - 23 mg/dL    Creatinine, Ser 0.53 0.44 - 1.00 mg/dL    Calcium 8.6 (L) 8.9 - 10.3 mg/dL    Total Protein 6.4 (L) 6.5 - 8.1 g/dL    Albumin 3.7 3.5 - 5.0 g/dL    AST 29 15 - 41 U/L    ALT 49 (H) 0 - 44 U/L    Alkaline Phosphatase 46 38 - 126 U/L    Total Bilirubin 0.7 0.3 - 1.2 mg/dL    GFR, Estimated >60 >60 mL/min      Comment:  (NOTE) Calculated using the CKD-EPI Creatinine Equation (2021)      Anion gap 8 5 - 15      Comment: Performed at [facility], [place] [address], [place], [place] [address]  Magnesium     Status: None    Collection Time: 06/27/21 12:56 AM  Result Value Ref Range    Magnesium 2.0 1.7 - 2.4 mg/dL      Comment: Performed at [facility] [address] [place], [place] [address]  Phosphorus     Status: None    Collection Time: 06/27/21 12:56 AM  Result Value Ref Range    Phosphorus 2.5 2.5 - 4.6 mg/dL      Comment: Performed at [facility] [address] [place], [place] [address]  CBC with Differential/Platelet     Status: Abnormal    Collection Time: 06/27/21 12:56 AM  Result Value Ref Range    WBC 8.9 4.0 - 10.5 K/uL    RBC 4.44 3.87 - 5.11 MIL/uL    Hemoglobin 13.5 12.0 - 15.0 g/dL    HCT 39.4 36.0 - 46.0 %    MCV 88.7 80.0 - 100.0 fL    MCH 30.4 26.0 - 34.0 pg    MCHC 34.3 30.0 - 36.0 g/dL    RDW 13.2 11.5 - 15.5 %    Platelets 355 150 - 400 K/uL    nRBC 0.0 0.0 - 0.2 %    Neutrophils Relative % 58 %    Neutro Abs 5.3 1.7 - 7.7 K/uL    Lymphocytes Relative 25 %    Lymphs Abs 2.2 0.7 - 4.0 K/uL    Monocytes Relative 12 %    Monocytes Absolute 1.1 (H) 0.1 - 1.0 K/uL    Eosinophils Relative 3 %    Eosinophils Absolute 0.2 0.0 - 0.5 K/uL    Basophils Relative 1 %    Basophils Absolute 0.1 0.0 - 0.1 K/uL    Immature Granulocytes 1 %  Abs Immature Granulocytes 0.04 0.00 - 0.07 K/uL      Comment: Performed at [facility], [place] [address], [place], [place] [address]  Comprehensive metabolic panel     Status: Abnormal    Collection Time: 06/28/21  1:31 AM  Result Value Ref Range    Sodium 129 (L) 135 - 145 mmol/L    Potassium 3.6 3.5 - 5.1 mmol/L    Chloride 97 (L) 98 - 111 mmol/L    CO2 25 22 - 32 mmol/L    Glucose, Bld 114 (H) 70 - 99 mg/dL      Comment: Glucose reference range applies only to samples taken after fasting for at least 8 hours.     BUN 15 8 - 23 mg/dL    Creatinine, Ser 0.61 0.44 - 1.00 mg/dL    Calcium 8.9 8.9 - 10.3 mg/dL    Total Protein 6.5 6.5 - 8.1 g/dL    Albumin 3.6 3.5 - 5.0 g/dL    AST 26 15 - 41 U/L    ALT 41 0 - 44 U/L    Alkaline Phosphatase 45 38 - 126 U/L    Total Bilirubin 0.8 0.3 - 1.2 mg/dL    GFR, Estimated >60 >60 mL/min      Comment: (NOTE) Calculated using the CKD-EPI Creatinine Equation (2021)      Anion gap 7 5 - 15      Comment: Performed at [facility] [address], [place], [place] [address]  Magnesium     Status: None    Collection Time: 06/28/21  1:31 AM  Result Value Ref Range    Magnesium 2.1 1.7 - 2.4 mg/dL      Comment: Performed at [facility] [address], [place], [place] [address]  Phosphorus     Status: None    Collection Time: 06/28/21  1:31 AM  Result Value Ref Range    Phosphorus 3.4 2.5 - 4.6 mg/dL      Comment: Performed at [facility] [address] [place], [place] [address]  CBC with Differential/Platelet     Status: Abnormal    Collection Time: 06/28/21  1:31 AM  Result Value Ref Range    WBC 10.6 (H) 4.0 - 10.5 K/uL    RBC 4.51 3.87 - 5.11 MIL/uL    Hemoglobin 13.6 12.0 - 15.0 g/dL    HCT 39.9 36.0 - 46.0 %    MCV 88.5 80.0 - 100.0 fL    MCH 30.2 26.0 - 34.0 pg    MCHC 34.1 30.0 - 36.0 g/dL    RDW 13.1 11.5 - 15.5 %    Platelets 366 150 - 400 K/uL    nRBC 0.0 0.0 - 0.2 %    Neutrophils Relative % 61 %    Neutro Abs 6.6 1.7 - 7.7 K/uL    Lymphocytes Relative 24 %    Lymphs Abs 2.5 0.7 - 4.0 K/uL    Monocytes Relative 12 %    Monocytes Absolute 1.2 (H) 0.1 - 1.0 K/uL    Eosinophils Relative 2 %    Eosinophils Absolute 0.2 0.0 - 0.5 K/uL    Basophils Relative 1 %    Basophils Absolute 0.1 0.0 - 0.1 K/uL    Immature Granulocytes 0 %    Abs Immature Granulocytes 0.04 0.00 - 0.07 K/uL      Comment: Performed at [facility] [address], [place], [place] [address]  Imaging Results (Last 48 hours)  No  results found.           Medical Problem List and Plan: 1.  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  Plan 30-day cardiac event monitor             -patient may  shower             -ELOS/Goals: mid I to supervision- 7-10 days 2.  Antithrombotics: -DVT/anticoagulation:   need to determine if needs Lovenox             -antiplatelet therapy: Aspirin 81 mg daily and Brilinta 90 mg twice daily x30 days then Plavix alone 3. Pain Management: Tylenol as needed 4. Mood: Provide emotional support             -antipsychotic agents: N/A 5. Neuropsych: This patient is capable of making decisions on her own behalf. 6. Skin/Wound Care: Routine skin checks 7. Fluids/Electrolytes/Nutrition: Routine in and outs with follow-up chemistries 8.  Hyperlipidemia.  Crestor 9.  Glaucoma.  Continue eyedrops 10.  History of breast cancer status post bilateral mastectomy.  Follow-up outpatient 11.  Permissive hypertension.  Chlorthalidone discontinued due to bouts of hyponatremia.  Patient on valsartan 325 mg daily as well as Cozaar 50 mg daily prior to admission.  Resume as needed 12. R inattention and aphasia-xpressive- per primary team     I have personally performed a face to face diagnostic evaluation of this patient and formulated the key components of the plan.  Additionally, I have personally reviewed laboratory data, imaging studies, as well as relevant notes and concur with the physician assistant's documentation above.   The patient's status has not changed from the original H&P.  Any changes in documentation from the acute care chart have been noted above.       Lavon Paganini Angiulli, PA-C 06/28/2021          Revision

## 2021-06-28 NOTE — Progress Notes (Signed)
Inpatient Rehabilitation Admissions Coordinator   CIR bed is available to admit today. I have alerted acute team and TOC and will make the arrangements to admit today.  Danne Baxter, RN, MSN Rehab Admissions Coordinator [phone] 06/28/2021 10:24 AM

## 2021-06-28 NOTE — Progress Notes (Signed)
Patient arrived and oriented to unit. No complaints or questions at this time. Resting comfortably with call bell in reach, verbalized agreement to call for all needs.

## 2021-06-29 DIAGNOSIS — I639 Cerebral infarction, unspecified: Secondary | ICD-10-CM | POA: Diagnosis not present

## 2021-06-29 LAB — CBC WITH DIFFERENTIAL/PLATELET
Abs Immature Granulocytes: 0.02 10*3/uL (ref 0.00–0.07)
Basophils Absolute: 0.1 10*3/uL (ref 0.0–0.1)
Basophils Relative: 1 %
Eosinophils Absolute: 0.2 10*3/uL (ref 0.0–0.5)
Eosinophils Relative: 3 %
HCT: 39.8 % (ref 36.0–46.0)
Hemoglobin: 13.2 g/dL (ref 12.0–15.0)
Immature Granulocytes: 0 %
Lymphocytes Relative: 23 %
Lymphs Abs: 2 10*3/uL (ref 0.7–4.0)
MCH: 29.5 pg (ref 26.0–34.0)
MCHC: 33.2 g/dL (ref 30.0–36.0)
MCV: 88.8 fL (ref 80.0–100.0)
Monocytes Absolute: 0.9 10*3/uL (ref 0.1–1.0)
Monocytes Relative: 11 %
Neutro Abs: 5.6 10*3/uL (ref 1.7–7.7)
Neutrophils Relative %: 62 %
Platelets: 360 10*3/uL (ref 150–400)
RBC: 4.48 MIL/uL (ref 3.87–5.11)
RDW: 13.1 % (ref 11.5–15.5)
WBC: 8.9 10*3/uL (ref 4.0–10.5)
nRBC: 0 % (ref 0.0–0.2)

## 2021-06-29 LAB — COMPREHENSIVE METABOLIC PANEL
ALT: 31 U/L (ref 0–44)
AST: 21 U/L (ref 15–41)
Albumin: 3.5 g/dL (ref 3.5–5.0)
Alkaline Phosphatase: 41 U/L (ref 38–126)
Anion gap: 6 (ref 5–15)
BUN: 15 mg/dL (ref 8–23)
CO2: 25 mmol/L (ref 22–32)
Calcium: 8.8 mg/dL — ABNORMAL LOW (ref 8.9–10.3)
Chloride: 100 mmol/L (ref 98–111)
Creatinine, Ser: 0.5 mg/dL (ref 0.44–1.00)
GFR, Estimated: >60 mL/min (ref >60)
Glucose, Bld: 113 mg/dL — ABNORMAL HIGH (ref 70–99)
Potassium: 3.7 mmol/L (ref 3.5–5.1)
Sodium: 131 mmol/L — ABNORMAL LOW (ref 135–145)
Total Bilirubin: 0.7 mg/dL (ref 0.3–1.2)
Total Protein: 6.2 g/dL — ABNORMAL LOW (ref 6.5–8.1)

## 2021-06-29 NOTE — Plan of Care (Signed)
  Problem: RH Expression Communication Goal: LTG Patient will increase word finding of common (SLP) Description: LTG:  Patient will increase word finding of common objects/daily info/abstract thoughts with cues using compensatory strategies (SLP). Flowsheets (Taken 06/29/2021 1650) LTG: Patient will increase word finding of common (SLP): Supervision   Problem: RH Problem Solving Goal: LTG Patient will demonstrate problem solving for (SLP) Description: LTG:  Patient will demonstrate problem solving for basic/complex daily situations with cues  (SLP) Flowsheets (Taken 06/29/2021 1650) LTG Patient will demonstrate problem solving for: Supervision   Problem: RH Memory Goal: LTG Patient will use memory compensatory aids to (SLP) Description: LTG:  Patient will use memory compensatory aids to recall biographical/new, daily complex information with cues (SLP) Flowsheets (Taken 06/29/2021 1650) LTG: Patient will use memory compensatory aids to (SLP): Supervision   Problem: RH Awareness Goal: LTG: Patient will demonstrate awareness during functional activites type of (SLP) Description: LTG: Patient will demonstrate awareness during functional activites type of (SLP) Flowsheets (Taken 06/29/2021 1650) LTG: Patient will demonstrate awareness during cognitive/linguistic activities with assistance of (SLP): Supervision

## 2021-06-29 NOTE — IPOC Note (Signed)
Overall Plan of Care [facility]) Patient Details Name: Sandra Becker MRN: [id number] DOB: 08/13/1940  Admitting Diagnosis: Infarction of left basal ganglia [facility])  Hospital Problems: Principal Problem:   Infarction of left basal ganglia ([place])     Functional Problem List: Nursing Medication Management, Safety, Endurance  PT Balance, Behavior, Endurance, Motor, Nutrition, Pain, Perception, Safety, Sensory, Skin Integrity  OT Balance, Motor, Cognition, Safety, Perception, Vision  SLP Cognition, Linguistic, Safety, Perception  TR         Basic ADL's: OT Grooming, Bathing, Dressing, Toileting     Advanced  ADL's: OT Simple Meal Preparation     Transfers: PT Bed Mobility, Bed to Chair, Car, Furniture, Floor  OT Toilet, Tub/Shower     Locomotion: PT Ambulation, Stairs     Additional Impairments: OT Fuctional Use of Upper Extremity  SLP Communication, Social Cognition expression, comprehension Attention, Memory, Problem Solving, Awareness  TR      Anticipated Outcomes Item Anticipated Outcome  Self Feeding Independent  Swallowing      Basic self-care  Supervision  Toileting  Supervision   Bathroom Transfers Supervision  Bowel/Bladder  N/A  Transfers  spv  Locomotion  spv  Communication  sup A  Cognition  sup A  Pain  N/A  Safety/Judgment  maintain w cues   Therapy Plan: PT Intensity: Minimum of 1-2 x/day ,45 to 90 minutes PT Frequency: 5 out of 7 days PT Duration Estimated Length of Stay: 7-10 days OT Intensity: Minimum of 1-2 x/day, 45 to 90 minutes OT Frequency: 5 out of 7 days OT Duration/Estimated Length of Stay: 10-12 days SLP Intensity: Minumum of 1-2 x/day, 30 to 90 minutes SLP Frequency: 3 to 5 out of 7 days SLP Duration/Estimated Length of Stay: 7-10 days   Due to the current state of emergency, patients may not be receiving their 3-hours of Medicare-mandated therapy.   Team Interventions: Nursing Interventions Disease  Management/Prevention, Medication Management, Discharge Planning, Patient/Family Education  PT interventions Ambulation/gait training, Cognitive remediation/compensation, Discharge planning, DME/adaptive equipment instruction, Functional mobility training, Pain management, Psychosocial support, Splinting/orthotics, Therapeutic Activities, UE/LE Strength taining/ROM, Visual/perceptual remediation/compensation, Training and development officer, Community reintegration, Disease management/prevention, Functional electrical stimulation, Neuromuscular re-education, Patient/family education, Skin care/wound management, Stair training, Therapeutic Exercise, UE/LE Coordination activities, Wheelchair propulsion/positioning  OT Interventions Training and development officer, DME/adaptive equipment instruction, Discharge planning, Self Care/advanced ADL retraining, Therapeutic Activities, UE/LE Coordination activities, Therapeutic Exercise, Patient/family education, Functional mobility training, Cognitive remediation/compensation, Neuromuscular re-education, Splinting/orthotics, UE/LE Strength taining/ROM, Community reintegration, Psychosocial support, Wheelchair propulsion/positioning, Visual/perceptual remediation/compensation, Disease mangement/prevention, Pain management  SLP Interventions Cognitive remediation/compensation, Internal/external aids, Speech/Language facilitation, English as a second language teacher, Functional tasks, Patient/family education, Therapeutic Activities  TR Interventions    SW/CM Interventions Discharge Planning, Psychosocial Support, Patient/Family Education, Disease Management/Prevention   Barriers to Discharge MD  Medical stability  Nursing Decreased caregiver support 1 level elevator access at ILF w spouse  PT      OT      SLP      SW       Team Discharge Planning: Destination: PT-Home ,OT- Home , SLP-Home Projected Follow-up: PT-Outpatient PT, 24 hour supervision/assistance, OT-  24 hour  supervision/assistance, Home health OT, SLP-Home Health SLP Projected Equipment Needs: PT-To be determined, OT- To be determined, SLP-None recommended by SLP Equipment Details: PT- , OT-  Patient/family involved in discharge planning: PT- Patient,  OT-Patient, SLP-Patient  MD ELOS: 7-10d Medical Rehab Prognosis:  Good Assessment: 80 year old right-handed female with history of hypertension, glaucoma,  hyperlipidemia, breast cancer status post bilateral mastectomy, quit smoking  57 years ago.  Per chart review lives with spouse independent living facility/[facility].She is a retired Chief Executive Officer . Modified independent prior to admission.  Presented 06/24/2021 with acute onset of right-sided weakness and slurred speech.  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  She was placed on DAPT with aspirin and Plavix.admission chemistry sodium 126 glucose 138, hemoglobin 14.2.  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  MRA of the head showed no intracranial large vessel occlusion or stenosis.  EEG negative for seizure.  Most recent echocardiogram 05/04/2021 ejection fraction 60 to 65% no wall motion abnormalities grade 1 diastolic dysfunction.  Neurology follow-up currently maintained on aspirin 81 mg daily as well as Brilinta 90 mg twice daily x30 days then Plavix alone as well as plan for 30-day cardiac event monitor.  Tolerating a regular diet.  Therapy evaluations completed due to patient's right side weakness and slurred speech was admitted for a comprehensive rehab program. Pt reports touble coming up with words- can't get the right words out.  LBM yesterday- denies pain; peeing OK.     See Team Conference Notes for weekly updates to the plan of care

## 2021-06-29 NOTE — Evaluation (Signed)
Occupational Therapy Assessment and Plan  Patient Details  Name: Sandra Becker MRN: [id number] Date of Birth: 1941/02/24  OT Diagnosis: abnormal posture, altered mental status, cognitive deficits, hemiplegia affecting dominant side, and muscle weakness (generalized) Rehab Potential: Rehab Potential (ACUTE ONLY): Excellent ELOS: 10-12 days   Today's Date: 06/29/2021 OT Individual Time: 2505-3976 OT Individual Time Calculation (min): 73 min     Hospital Problem: Principal Problem:   Infarction of left basal ganglia (HCC)   Past Medical History:  Past Medical History:  Diagnosis Date   Breast cancer ([place]) 1993   bilateral mastectomy; no chemo or radiation   Glaucoma    Hyperlipidemia    Hypertension    Past Surgical History:  Past Surgical History:  Procedure Laterality Date   MASTECTOMY Bilateral 1993   TONSILLECTOMY AND ADENOIDECTOMY  1947   TUBAL LIGATION  1974    Assessment & Plan Clinical Impression: Patient is an 80 y.o. year old female with recent admission to the hospital on 06/24/2021 with acute onset of right-sided weakness and slurred speech.  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  She was placed on DAPT with aspirin and Plavix.admission chemistry sodium 126 glucose 138, hemoglobin 14.2.  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  MRA of the head showed no intracranial large vessel occlusion or stenosis.  EEG negative for seizure.   Patient transferred to CIR on 80/07/2020 .    Patient currently requires min with basic self-care skills secondary to muscle weakness and muscle paralysis, abnormal tone, unbalanced muscle activation, decreased coordination, and decreased motor planning, decreased attention to right and decreased motor planning, decreased awareness, decreased problem solving, decreased safety awareness, and decreased memory, and  decreased standing balance, decreased postural control, hemiplegia, and decreased balance strategies.  Prior to hospitalization, patient could complete selfcare task with min.  Patient will benefit from skilled intervention to decrease level of assist with basic self-care skills and increase independence with basic self-care skills prior to discharge home with care partner.  Anticipate patient will require intermittent supervision and follow up home health.  OT - End of Session Activity Tolerance: Improving Endurance Deficit: Yes OT Assessment Rehab Potential (ACUTE ONLY): Excellent OT Patient demonstrates impairments in the following area(s): Balance;Motor;Cognition;Safety;Perception;Vision OT Basic ADL's Functional Problem(s): Grooming;Bathing;Dressing;Toileting OT Advanced ADL's Functional Problem(s): Simple Meal Preparation OT Transfers Functional Problem(s): Toilet;Tub/Shower OT Additional Impairment(s): Fuctional Use of Upper Extremity OT Plan OT Intensity: Minimum of 1-2 x/day, 45 to 90 minutes OT Frequency: 5 out of 7 days OT Duration/Estimated Length of Stay: 10-12 days OT Treatment/Interventions: Teacher, English as a foreign language;Discharge planning;Self Care/advanced ADL retraining;Therapeutic Activities;UE/LE Coordination activities;Therapeutic Exercise;Patient/family education;Functional mobility training;Cognitive remediation/compensation;Neuromuscular re-education;Splinting/orthotics;UE/LE Strength taining/ROM;Community reintegration;Psychosocial support;Wheelchair propulsion/positioning;Visual/perceptual remediation/compensation;Disease mangement/prevention;Pain management OT Self Feeding Anticipated Outcome(s): Independent OT Basic Self-Care Anticipated Outcome(s): Supervision OT Toileting Anticipated Outcome(s): Supervision OT Bathroom Transfers Anticipated Outcome(s): Supervision OT Recommendation Patient destination: Home Follow Up  Recommendations: 24 hour supervision/assistance;Home health OT Equipment Recommended: To be determined   OT Evaluation Precautions/Restrictions  Precautions Precautions: Fall Precaution Comments: slight right inattention, right hemi paresis, impulsive Restrictions Weight Bearing Restrictions: No  Pain Pain Assessment Pain Scale: 0-10 Pain Score: 0-No pain Home Living/Prior Functioning Home Living Family/patient expects to be discharged to:: Private residence Living Arrangements: Spouse/significant other Available Help at Discharge: Available 24 hours/day, Family Type of Home: Independent living facility Home Access: Elevator Home Layout: One level Bathroom Shower/Tub: Multimedia programmer:  Standard Bathroom Accessibility: Yes Additional Comments: outpatient. Lives ILF at [facility] for 4 years  Lives With: Spouse IADL History Homemaking Responsibilities: Yes Meal Prep Responsibility: Secondary Laundry Responsibility: Secondary Current License: Yes Education: Conservation officer, historic buildings; attorney Occupation: Retired Prior Function Level of Independence: Independent with basic ADLs, Independent with gait  Able to Take Stairs?: Yes Driving: Yes Vocation: Retired Surveyor, mining Baseline Vision/History: 1 Wears glasses Ability to See in Adequate Light: 0 Adequate Patient Visual Report: No change from baseline Vision Assessment?: Yes Eye Alignment: Within Functional Limits Ocular Range of Motion: Within Functional Limits Alignment/Gaze Preference: Within Defined Limits Tracking/Visual Pursuits: Decreased smoothness of horizontal tracking;Decreased smoothness of vertical tracking Convergence: Within functional limits Visual Fields: No apparent deficits (no deificits with quick screen, will continue to evaluate further) Perception  Perception: Impaired Inattention/Neglect: Does not attend to right side of body Praxis Praxis: Impaired Praxis Impairment Details: Motor  planning Cognition Overall Cognitive Status: Impaired/Different from baseline Arousal/Alertness: Awake/alert Orientation Level: Person;Place;Situation Person: Oriented Place: Oriented Situation: Oriented Year: 2022 Month: December Day of Week: Correct Memory: Impaired Memory Impairment: Decreased recall of new information Decreased Short Term Memory: Verbal basic;Functional basic Immediate Memory Recall: Sock;Blue;Bed Memory Recall Sock: Without Cue Memory Recall Blue: Without Cue Memory Recall Bed: With Cue Attention: Sustained;Selective Sustained Attention: Appears intact Sustained Attention Impairment: Verbal basic;Functional basic Selective Attention: Impaired Selective Attention Impairment: Verbal basic;Functional basic Awareness: Impaired Awareness Impairment: Emergent impairment;Intellectual impairment Problem Solving: Impaired Problem Solving Impairment: Functional basic;Functional complex Executive Function: Writer: Impaired Organizing Impairment: Verbal basic;Functional basic Behaviors: Impulsive Safety/Judgment: Impaired Sensation Sensation Light Touch: Appears Intact Hot/Cold: Appears Intact Proprioception: Appears Intact Stereognosis: Appears Intact Additional Comments: Sensation intact in BUEs Coordination Gross Motor Movements are Fluid and Coordinated: No Fine Motor Movements are Fluid and Coordinated: No Coordination and Movement Description: Pt with decreased coordination in the RUE at thist time with decreased speed with finger to nose testing as well when compared to the LUE.  Uses currently at a diminshed level with selfcare tasks. Motor  Motor Motor: Hemiplegia;Abnormal postural alignment and control Motor - Skilled Clinical Observations: slight R hemi  Trunk/Postural Assessment  Cervical Assessment Cervical Assessment: Exceptions to [facility] (cervical protraction at rest) Thoracic Assessment Thoracic Assessment: Exceptions to [facility]  (thoracic rounding) Lumbar Assessment Lumbar Assessment: Exceptions to [facility] (posterior pelvic tilt in sitting)  Balance Balance Balance Assessed: Yes Static Sitting Balance Static Sitting - Balance Support: Feet supported Static Sitting - Level of Assistance: 5: Stand by assistance Dynamic Sitting Balance Dynamic Sitting - Balance Support: Feet unsupported;During functional activity Dynamic Sitting - Level of Assistance: 5: Stand by assistance Static Standing Balance Static Standing - Balance Support: During functional activity;Bilateral upper extremity supported Static Standing - Level of Assistance:  (min guard) Dynamic Standing Balance Dynamic Standing - Balance Support: During functional activity Dynamic Standing - Level of Assistance: 4: Min assist Extremity/Trunk Assessment RUE Assessment RUE Assessment: Exceptions to [facility] Passive Range of Motion (PROM) Comments: WFLS Active Range of Motion (AROM) Comments: WFLs General Strength Comments: 4/5 throughout with decreased speed noted finger to nose and decreased FM coordination when attempting to manipulate and hold onto the soap bottle during selfcare tasks. LUE Assessment LUE Assessment: Within Functional Limits General Strength Comments: strength 5/5 throughout  Care Tool Care Tool Self Care Eating   Eating Assist Level: Set up assist    Oral Care    Oral Care Assist Level: Minimal Assistance - Patient > 75% (standing)    Bathing   Body parts bathed by patient: Right  arm;Left arm;Chest;Abdomen;Front perineal area;Buttocks;Right upper leg;Left upper leg;Face;Left lower leg;Right lower leg     Assist Level: Minimal Assistance - Patient > 75%    Upper Body Dressing(including orthotics)   What is the patient wearing?: Pull over shirt   Assist Level: Supervision/Verbal cueing    Lower Body Dressing (excluding footwear)   What is the patient wearing?: Pants;Underwear/pull up Assist for lower body dressing: Minimal  Assistance - Patient > 75%    Putting on/Taking off footwear   What is the patient wearing?: Shoes;Socks Assist for footwear: Minimal Assistance - Patient > 75%       Care Tool Toileting Toileting activity   Assist for toileting: Minimal Assistance - Patient > 75%     Care Tool Bed Mobility Roll left and right activity        Sit to lying activity        Lying to sitting on side of bed activity   Lying to sitting on side of bed assist level: the ability to move from lying on the back to sitting on the side of the bed with no back support.: Contact Guard/Touching assist     Care Tool Transfers Sit to stand transfer   Sit to stand assist level: Minimal Assistance - Patient > 75%    Chair/bed transfer   Chair/bed transfer assist level: Minimal Assistance - Patient > 75%     Toilet transfer   Assist Level: Minimal Assistance - Patient > 75%     Care Tool Cognition  Expression of Ideas and Wants Expression of Ideas and Wants: 3. Some difficulty - exhibits some difficulty with expressing needs and ideas (e.g, some words or finishing thoughts) or speech is not clear  Understanding Verbal and Non-Verbal Content Understanding Verbal and Non-Verbal Content: 3. Usually understands - understands most conversations, but misses some part/intent of message. Requires cues at times to understand   Memory/Recall Ability Memory/Recall Ability : Current season;That he or she is in a hospital/hospital unit   Refer to Care Plan for [place] 1 OT Short Term Goal 1 (Week 1): Pt will complete LB selfcare with supervision sit to stand. OT Short Term Goal 2 (Week 1): Pt will complete LB dressing with supervision sit to stand. OT Short Term Goal 3 (Week 1): Pt will complete toilet transfers with supervision using the RW for support. OT Short Term Goal 4 (Week 1): Pt will complete walk-in shower transfers with use of the RW and close supervision.  Recommendations for  other services: None    Skilled Therapeutic Intervention ADL ADL Eating: Set up Where Assessed-Eating: Bed level Grooming: Minimal assistance Where Assessed-Grooming: Standing at sink Upper Body Bathing: Supervision/safety Where Assessed-Upper Body Bathing: Shower;Chair Lower Body Bathing: Minimal assistance Where Assessed-Lower Body Bathing: Shower Upper Body Dressing: Supervision/safety Where Assessed-Upper Body Dressing: Edge of bed Lower Body Dressing: Minimal assistance Where Assessed-Lower Body Dressing: Edge of bed Toileting: Minimal assistance Where Assessed-Toileting: Glass blower/designer: Psychiatric nurse Method: Counselling psychologist: Raised toilet seat;Grab bars Tub/Shower Transfer: Not assessed Social research officer, government: Environmental education officer Method: Heritage manager: Manufacturing systems engineer  Bed Mobility Bed Mobility: Supine to Sit Supine to Sit: Supervision/Verbal cueing Transfers Sit to Stand: Minimal Assistance - Patient > 75% Stand to Sit: Minimal Assistance - Patient > 75%  Session Note:  Pt completed shower and dressing during session.  Flat affect noted throughout session.  She was able to complete  functional mobility to the shower with min assist for bathing.  She completed bathing sit to stand at min assist as well with use of the grab bars for support.  Min instructional cueing to sequencing and motor plan through bathing task as initially she applied a small amount of soap to her hair without awareness to wet it first.  When cued to wet it,  she tried to run her hand in the shower head and then cup water onto her head instead of picking up the shower head and wetting her hair.  Eventually, therapist handed her the shower head and she was able to complete task.  She was able to then transfer out to the EOB with min assist to complete dressing tasks.  She was able to scan right of  midline for locating all clothing.  Setup for donning a pullover shirt with min assist for underpants and pants sit to stand.  She was then able to complete donning her socks with setup as well as donning her shoes over her feet.  Min assist with tightening laces, but then she was able to tie them.  Had her ambulate to the sink with min assist and stand to then complete oral hygiene.  She was able to use the RUE for brushing her teeth but demonstrated decreased coordination and motor planning deficits moving her head back and forth instead of moving the toothbrush.  Finished session with pt in the recliner and with the call button and phone in reach.      Discharge Criteria: Patient will be discharged from OT if patient refuses treatment 3 consecutive times without medical reason, if treatment goals not met, if there is a change in medical status, if patient makes no progress towards goals or if patient is discharged from hospital.  The above assessment, treatment plan, treatment alternatives and goals were discussed and mutually agreed upon: by patient  , OTR/L 06/29/2021, 4:47 PM

## 2021-06-29 NOTE — Evaluation (Signed)
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[place])  Past Medical History:  Past Medical History:  Diagnosis Date   Breast cancer ([place]) 1993   bilateral mastectomy; no chemo or radiation   Glaucoma    Hyperlipidemia    Hypertension    Past Surgical History:  Past Surgical History:  Procedure Laterality Date   MASTECTOMY Bilateral 1993   TONSILLECTOMY AND ADENOIDECTOMY  1947   TUBAL LIGATION  1974    Assessment / Plan / Recommendation Clinical Impression HPI: Sandra Becker is an 80 year old right-handed female with history of hypertension, glaucoma,  hyperlipidemia, breast cancer status post bilateral mastectomy, quit smoking 57 years ago. Per chart review lives with spouse independent living facility/[facility]. She is a retired Chief Executive Officer. Modified independent prior to admission. Presented 06/24/2021 with acute onset of right-sided weakness and slurred speech. 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 Tolerating a regular diet.  Pt reports touble coming up with words- can't get the right words out.   Pt seen for speech/language and cognitive evaluation in setting of recent CVA. Pt presents with a mild expressive aphasia characterized decreased verbal fluency with hesitations, some halting, and word finding difficulty at conversation level. Pt also presented with flat affect. Auditory comprehension appeared grossly intact although pt benefited from  increased processing time. Per Western Aphasia Battery Bedside (WAB-B) pt scored WFL for auditory comprehension with basic and complex yes/no questions and verbal repetition. Pt with mild difficulty following sequential commands (score 8/10) and object naming (score 8/10). Pt exhibited significant difficulty with generative naming tasks as evidenced by naming only 2 animals in one minute. Pt exhibited difficulty writing although suspect this attributed to decreased strength and fine motor skill in upper extremity. Speech was perceived as 100% intelligible and without evidence of dysarthria. Pt was oriented x4 and displayed appropriate sustained attention and became mildly distracted to external stimuli. She exhibited decreased problem solving skills, decreased short-term recall, and right visual field deficits. Patient would benefit from skilled SLP intervention to maximize her cognitive-communication and language functioning, overall independence, and decrease caregiver burden.   Skilled Therapeutic Interventions          Pt participated in [place] Bedside (WAB-B) as well as further non-standardized assessments of cognitive-linguistic, speech, and language function. Please see above.     SLP Assessment  Patient will need skilled [place] Pathology Services during CIR admission    Recommendations  Patient destination: Home Follow up Recommendations: Home Health SLP Equipment Recommended: None recommended by SLP    SLP Frequency 3 to 5 out of 7 days   SLP Duration  SLP Intensity  SLP Treatment/Interventions 7-10 days  Minumum of 1-2 x/day, 30 to 90 minutes  Cognitive remediation/compensation;Internal/external aids;Speech/Language facilitation;Cueing hierarchy;Functional tasks;Patient/family education;Therapeutic Activities    Pain Pain Assessment Pain Scale: 0-10 Pain Score: 0-No pain  Prior Functioning Cognitive/Linguistic Baseline: Information not available Type  of Home: Independent living facility  Lives With: Spouse Available Help at Discharge: Available 24 hours/day;Family Education: Law degree; attorney Vocation: Retired  Merchandiser, retail  Cognition Overall Cognitive Status: Impaired/Different from baseline Arousal/Alertness: Awake/alert Orientation Level: Oriented X4 Year: 2022 Month: December Day of Week: Correct Attention: Sustained;Selective Sustained Attention: Appears intact Sustained Attention Impairment: Verbal basic;Functional basic Selective Attention: Impaired Selective Attention Impairment: Verbal basic;Functional basic Memory: Impaired Memory Impairment: Decreased recall of new information Decreased Short Term Memory: Verbal basic;Functional basic Immediate Memory Recall: Sock;Blue;Bed Memory Recall Sock: Without Cue Memory Recall Blue: Without Cue Memory Recall Bed: With Cue Awareness: Impaired Awareness Impairment: Emergent impairment;Intellectual impairment Problem Solving: Impaired Problem Solving Impairment: Functional basic;Functional complex Executive Function: Writer: Impaired Organizing Impairment: Verbal basic;Functional basic Behaviors: Impulsive Safety/Judgment: Impaired  Comprehension Auditory Comprehension Overall Auditory Comprehension: Appears within functional limits for tasks assessed Interfering Components: Working Marine scientist;Attention EffectiveTechniques: Extra processing time;Repetition Expression Expression Primary Mode of Expression: Verbal Verbal Expression Overall Verbal Expression: Impaired Level of Generative/Spontaneous Verbalization: Conversation Repetition: No impairment Naming: Impairment Responsive: 76-100% accurate Confrontation: Within functional limits Convergent: 25-49% accurate Divergent: 25-49% accurate Other Naming Comments: perseveration Verbal Errors: Perseveration;Semantic paraphasias;Aware of errors Pragmatics: Impairment Impairments: Abnormal  affect Written Expression Dominant Hand: Right Oral Motor Oral Motor/Sensory Function Overall Oral Motor/Sensory Function: Within functional limits Motor Speech Articulation: Within functional limitis Intelligibility: Intelligible  Care Tool Care Tool Cognition Ability to hear (with hearing aid or hearing appliances if normally used Ability to hear (with hearing aid or hearing appliances if normally used): 1. Minimal difficulty - difficulty in some environments (e.g. when person speaks softly or setting is noisy)   Expression of Ideas and Wants Expression of Ideas and Wants: 3. Some difficulty - exhibits some difficulty with expressing needs and ideas (e.g, some words or finishing thoughts) or speech is not clear   Understanding Verbal and Non-Verbal Content Understanding Verbal and Non-Verbal Content: 3. Usually understands - understands most conversations, but misses some part/intent of message. Requires cues at times to understand  Memory/Recall Ability Memory/Recall Ability : Current season;That he or she is in a hospital/hospital unit   Intelligibility: Intelligible  Short Term Goals: Week 1: SLP Short Term Goal 1 (Week 1): STG=LTG due to ELOS  Refer to Care Plan for Long Term Goals  Recommendations for other services: None   Discharge Criteria: Patient will be discharged from SLP if patient refuses treatment 3 consecutive times without medical reason, if treatment goals not met, if there is a change in medical status, if patient makes no progress towards goals or if patient is discharged from hospital.  The above assessment, treatment plan, treatment alternatives and goals were discussed and mutually agreed upon: by patient  Patty Sermons 06/29/2021, 4:45 PM

## 2021-06-29 NOTE — Progress Notes (Signed)
Patient ID: Sandra Becker, female   DOB: July 17, 1941, 80 y.o.   MRN: [id number] Met with the patient to introduce self and role. Reviewed rehab routine, therapy schedule and plan of care. Reviewed secondary risks including HTN, HLD and DAPT x 30 days per MD. Patient given handouts on dietary modifications and tips for secondary risk management. Continue to follow along to discharge to address educational needs and collaborate with the team to facilitate preparation for discharge home with spouse. Margarito Liner

## 2021-06-29 NOTE — Progress Notes (Signed)
PROGRESS NOTE   Subjective/Complaints:  No issues overnite, working with OT  ROS-limited by aphasia   Objective:   No results found. Recent Labs    06/28/21 0131 06/29/21 0629  WBC 10.6* 8.9  HGB 13.6 13.2  HCT 39.9 39.8  PLT 366 360   Recent Labs    06/28/21 0131 06/29/21 0629  NA 129* 131*  K 3.6 3.7  CL 97* 100  CO2 25 25  GLUCOSE 114* 113*  BUN 15 15  CREATININE 0.61 0.50  CALCIUM 8.9 8.8*    Intake/Output Summary (Last 24 hours) at 06/29/2021 0841 Last data filed at 06/28/2021 1750 Gross per 24 hour  Intake 240 ml  Output --  Net 240 ml        Physical Exam: Vital Signs Blood pressure (!) 162/88, pulse 82, temperature 97.7 F (36.5 C), temperature source Oral, resp. rate 17, height 5\' 5"  (1.651 m), weight 78.2 kg, SpO2 100 %.  General: No acute distress Mood and affect are appropriate Heart: Regular rate and rhythm no rubs murmurs or extra sounds Lungs: Clear to auscultation, breathing unlabored, no rales or wheezes Abdomen: Positive bowel sounds, soft nontender to palpation, nondistended Extremities: No clubbing, cyanosis, or edema Skin: No evidence of breakdown, no evidence of rash Neurologic: Cranial nerves II through XII intact, motor strength is 5/5 in bilateral deltoid, bicep, tricep, grip, hip flexor, knee extensors, ankle dorsiflexor and plantar flexor Able to name simple objects like chair and car but not sethoscope  Musculoskeletal: Full range of motion in all 4 extremities. No joint swelling    Assessment/Plan: 1. Functional deficits which require 3+ hours per day of interdisciplinary therapy in a comprehensive inpatient rehab setting. Physiatrist is providing close team supervision and 24 hour management of active medical problems listed below. Physiatrist and rehab team continue to assess barriers to discharge/monitor patient progress toward functional and medical goals  Care  Tool:  Bathing              Bathing assist       Upper Body Dressing/Undressing Upper body dressing        Upper body assist      Lower Body Dressing/Undressing Lower body dressing            Lower body assist       Toileting Toileting    Toileting assist Assist for toileting: Moderate Assistance - Patient 50 - 74%     Transfers Chair/bed transfer  Transfers assist     Chair/bed transfer assist level: Moderate Assistance - Patient 50 - 74%     Locomotion Ambulation   Ambulation assist              Walk 10 feet activity   Assist           Walk 50 feet activity   Assist           Walk 150 feet activity   Assist           Walk 10 feet on uneven surface  activity   Assist           Wheelchair     Assist  Wheelchair 50 feet with 2 turns activity    Assist            Wheelchair 150 feet activity     Assist          Blood pressure (!) 162/88, pulse 82, temperature 97.7 F (36.5 C), temperature source Oral, resp. rate 17, height 5\' 5"  (1.651 m), weight 78.2 kg, SpO2 100 %.  Medical Problem List and Plan: 1.  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  Plan 30-day cardiac event monitor             -patient may  shower             -ELOS/Goals: mid I to supervision- 7-10 days 2.  Antithrombotics: -DVT/anticoagulation:   need to determine if needs Lovenox             -antiplatelet therapy: Aspirin 81 mg daily and Brilinta 90 mg twice daily x30 days then Plavix alone 3. Pain Management: Tylenol as needed 4. Mood: Provide emotional support             -antipsychotic agents: N/A 5. Neuropsych: This patient is capable of making decisions on her own behalf. 6. Skin/Wound Care: Routine skin checks 7. Fluids/Electrolytes/Nutrition: Routine in and outs  with follow-up chemistries 8.  Hyperlipidemia.  Crestor 9.  Glaucoma.  Continue eyedrops 10.  History of breast cancer status post bilateral mastectomy.  Follow-up outpatient 11.  Permissive hypertension.  Chlorthalidone discontinued due to bouts of hyponatremia.  Patient on valsartan 325 mg daily as well as Cozaar 50 mg daily prior to admission.  Resume as needed Vitals:   06/28/21 2001 06/29/21 0522  BP: (!) 157/59 (!) 162/88  Pulse: 91 82  Resp: 17 17  Temp: 97.7 F (36.5 C) 97.7 F (36.5 C)  SpO2: 98% 100%    Will tighten control next week     LOS: 1 days A FACE TO FACE EVALUATION WAS PERFORMED  Charlett Blake 06/29/2021, 8:41 AM

## 2021-06-29 NOTE — Progress Notes (Signed)
[facility] Individual Statement of Services  Patient Name:  Sandra Becker  Date:  06/29/2021  Welcome to the [facility].  Our goal is to provide you with an individualized program based on your diagnosis and situation, designed to meet your specific needs.  With this comprehensive rehabilitation program, you will be expected to participate in at least 3 hours of rehabilitation therapies Monday-Friday, with modified therapy programming on the weekends.  Your rehabilitation program will include the following services:  Physical Therapy (PT), Occupational Therapy (OT), Speech Therapy (ST), 24 hour per day rehabilitation nursing, Therapeutic Recreaction (TR), Neuropsychology, Care Coordinator, Rehabilitation Medicine, Nutrition Services, Pharmacy Services, and Other  Weekly team conferences will be held on Wednesdays to discuss your progress.  Your Inpatient Rehabilitation Care Coordinator will talk with you frequently to get your input and to update you on team discussions.  Team conferences with you and your family in attendance may also be held.  Expected length of stay:  7-10 Days  Overall anticipated outcome:  Supervision to MOD I  Depending on your progress and recovery, your program may change. Your Inpatient Rehabilitation Care Coordinator will coordinate services and will keep you informed of any changes. Your Inpatient Rehabilitation Care Coordinator's name and contact numbers are listed  below.  The following services may also be recommended but are not provided by the [facility]:   [place] will be made to provide these services after discharge if needed.  Arrangements include referral to agencies that provide these services.  Your insurance has been verified to be:  Medicare A & B Your primary doctor is:  Kelton Pillar, MD   Pertinent  information will be shared with your doctor and your insurance company.  Inpatient Rehabilitation Care Coordinator:  Erlene Quan, [place] or [phone]  Information discussed with and copy given to patient by: Dyanne Iha, 06/29/2021, 11:27 AM

## 2021-06-29 NOTE — Plan of Care (Signed)
  Problem: RH Balance Goal: LTG Patient will maintain dynamic sitting balance (PT) Description: LTG:  Patient will maintain dynamic sitting balance with assistance during mobility activities (PT) Flowsheets (Taken 06/29/2021 1154) LTG: Pt will maintain dynamic sitting balance during mobility activities with:: Supervision/Verbal cueing Goal: LTG Patient will maintain dynamic standing balance (PT) Description: LTG:  Patient will maintain dynamic standing balance with assistance during mobility activities (PT) Flowsheets (Taken 06/29/2021 1154) LTG: Pt will maintain dynamic standing balance during mobility activities with:: Supervision/Verbal cueing   Problem: Sit to Stand Goal: LTG:  Patient will perform sit to stand with assistance level (PT) Description: LTG:  Patient will perform sit to stand with assistance level (PT) Flowsheets (Taken 06/29/2021 1154) LTG: PT will perform sit to stand in preparation for functional mobility with assistance level: Supervision/Verbal cueing   Problem: RH Bed Mobility Goal: LTG Patient will perform bed mobility with assist (PT) Description: LTG: Patient will perform bed mobility with assistance, with/without cues (PT). Flowsheets (Taken 06/29/2021 1154) LTG: Pt will perform bed mobility with assistance level of: Independent with assistive device    Problem: RH Bed to Chair Transfers Goal: LTG Patient will perform bed/chair transfers w/assist (PT) Description: LTG: Patient will perform bed to chair transfers with assistance (PT). Flowsheets (Taken 06/29/2021 1154) LTG: Pt will perform Bed to Chair Transfers with assistance level: Supervision/Verbal cueing   Problem: RH Floor Transfers Goal: LTG Patient will perform floor transfers w/assist (PT) Description: LTG: Patient will perform floor transfers with assistance (PT). Flowsheets (Taken 06/29/2021 1154) LTG: PT WILL PERFORM FLOOR TRANFERS  WITH  ASSIST:: Contact Guard/Touching assist   Problem: RH  Ambulation Goal: LTG Patient will ambulate in controlled environment (PT) Description: LTG: Patient will ambulate in a controlled environment, # of feet with assistance (PT). Flowsheets (Taken 06/29/2021 1154) LTG: Pt will ambulate in controlled environ  assist needed:: Supervision/Verbal cueing LTG: Ambulation distance in controlled environment: 150 Goal: LTG Patient will ambulate in home environment (PT) Description: LTG: Patient will ambulate in home environment, # of feet with assistance (PT). Flowsheets (Taken 06/29/2021 1154) LTG: Pt will ambulate in home environ  assist needed:: Supervision/Verbal cueing LTG: Ambulation distance in home environment: 50

## 2021-06-29 NOTE — Progress Notes (Signed)
Inpatient Rehabilitation  Patient information reviewed and entered into eRehab system by Dhruvi Crenshaw M. [place] , M.A., CCC/SLP, PPS Coordinator.  Information including medical coding, functional ability and quality indicators will be reviewed and updated through discharge.    

## 2021-06-29 NOTE — Evaluation (Signed)
Physical Therapy Assessment and Plan  Patient Details  Name: Sandra Becker MRN: [id number] Date of Birth: [birth year]  PT Diagnosis: Abnormal posture, Abnormality of gait, Cognitive deficits, Hemiparesis dominant, and Muscle weakness Rehab Potential: Good ELOS: 7-10 days   Today's Date: 06/29/2021 PT Individual Time: 1000-1055 PT Individual Time Calculation (min): 55 min    Hospital Problem: Principal Problem:   Infarction of left basal ganglia ([place])   Past Medical History:  Past Medical History:  Diagnosis Date   Breast cancer ([place]) 1993   bilateral mastectomy; no chemo or radiation   Glaucoma    Hyperlipidemia    Hypertension    Past Surgical History:  Past Surgical History:  Procedure Laterality Date   MASTECTOMY Bilateral 1993   TONSILLECTOMY AND ADENOIDECTOMY  1947   TUBAL LIGATION  1974    Assessment & Plan Clinical Impression: Patient is a 80 y.o. year old female with history of hypertension, glaucoma,  hyperlipidemia, breast cancer status post bilateral mastectomy, quit smoking 57 years ago.  Per chart review lives with spouse independent living facility/[facility].She is a retired Chief Executive Officer . Modified independent prior to admission.  Presented 06/24/2021 with acute onset of right-sided weakness and slurred speech.  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  She was placed on DAPT with aspirin and Plavix.admission chemistry sodium 126 glucose 138, hemoglobin 14.2.  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  MRA of the head showed no intracranial large vessel occlusion or stenosis.  EEG negative for seizure.  Most recent echocardiogram 05/04/2021 ejection fraction 60 to 65% no wall motion abnormalities grade 1 diastolic dysfunction.  Neurology follow-up currently maintained on aspirin 81 mg daily as well as Brilinta 90 mg twice daily x30 days then Plavix  alone as well as plan for 30-day cardiac event monitor.  Tolerating a regular diet.  Therapy evaluations completed due to patient's right side weakness and slurred speech was admitted for a comprehensive rehab program. Pt reports touble coming up with words- can't get the right words out.  LBM yesterday- denies pain; peeing OK.   Patient currently requires min with mobility secondary to muscle weakness, decreased cardiorespiratoy endurance, impaired timing and sequencing, decreased coordination, and decreased motor planning, decreased visual perceptual skills, decreased attention to right and decreased motor planning, decreased initiation, decreased attention, decreased awareness, decreased problem solving, decreased safety awareness, decreased memory, and delayed processing, and decreased sitting balance, decreased standing balance, decreased postural control, hemiplegia, and decreased balance strategies.  Prior to hospitalization, patient was modified independent  with mobility and lived with Spouse in a Independent living facility home.  Home access is  Elevator.  Patient will benefit from skilled PT intervention to maximize safe functional mobility, minimize fall risk, and decrease caregiver burden for planned discharge home with 24 hour supervision.  Anticipate patient will benefit from follow up OP at discharge.  Patient received sitting up in recliner, agreeable to PT eval. She denies pain. Patient maintaining flat affect throughout eval. Mildly impulsive trying to stand up before wc brakes were locked/foot rests out of the way/PT ready. She requires grossly MInA and mainly because of R inattention/neglect. With quadrant testing, patient accurate 4/5 times, but when ambulating, patient would walk into door frames on R. Also, when cued to walk toward wall on R, patient would turn left. Patient also with difficulty seeing stairs in gym when they were essentially directly in front of her.  Patient scored  a 28/56 on the Berg balance scale indicating increased risk for falling. Discussed findings with patient and she verbalized understanding. Patient will require 24/7 supervision at home. At end of session, patient remaining up in recliner, seatbelt alarm on, call light within reach.   PT - End of Session Activity Tolerance: Tolerates 30+ min activity with multiple rests Endurance Deficit: Yes PT Assessment Rehab Potential (ACUTE/IP ONLY): Good PT Patient demonstrates impairments in the following area(s): Balance;Behavior;Endurance;Motor;Nutrition;Pain;Perception;Safety;Sensory;Skin Integrity PT Transfers Functional Problem(s): Bed Mobility;Bed to Chair;Car;Furniture;Floor PT Locomotion Functional Problem(s): Ambulation;Stairs PT Plan PT Intensity: Minimum of 1-2 x/day ,45 to 90 minutes PT Frequency: 5 out of 7 days PT Duration Estimated Length of Stay: 7-10 days PT Treatment/Interventions: Ambulation/gait training;Cognitive remediation/compensation;Discharge planning;DME/adaptive equipment instruction;Functional mobility training;Pain management;Psychosocial support;Splinting/orthotics;Therapeutic Activities;UE/LE Strength taining/ROM;Visual/perceptual remediation/compensation;Balance/vestibular training;Community reintegration;Disease management/prevention;Functional electrical stimulation;Neuromuscular re-education;Patient/family education;Skin care/wound management;Stair training;Therapeutic Exercise;UE/LE Coordination activities;Wheelchair propulsion/positioning PT Transfers Anticipated Outcome(s): spv PT Locomotion Anticipated Outcome(s): spv PT Recommendation Follow Up Recommendations: Outpatient PT;24 hour supervision/assistance Patient destination: Home Equipment Recommended: To be determined   PT Evaluation Precautions/Restrictions Precautions Precautions: Fall Precaution Comments: R inattention/field cut?, slight R hemi, impulsive Restrictions Weight Bearing Restrictions:  No General   Vital Signs Pain Pain Assessment Pain Scale: 0-10 Pain Score: 0-No pain Pain Interference Pain Interference Pain Effect on Sleep: 0. Does not apply - I have not had any pain or hurting in the past 5 days Pain Interference with Therapy Activities: 0. Does not apply - I have not received rehabilitationtherapy in the past 5 days Pain Interference with Day-to-Day Activities: 1. Rarely or not at all Home Living/Prior [place] Available Help at Discharge: Available 24 hours/day;Family Type of Home: Independent living facility Home Access: Elevator Bathroom Shower/Tub: Holiday representative Toilet: Standard Bathroom Accessibility: Yes Additional Comments: outpatient. Lives ILF at [facility] for 4 years  Lives With: Spouse Prior Function Level of Independence: Independent with homemaking with ambulation;Independent with transfers;Independent with basic ADLs;Independent with gait  Able to Take Stairs?: Yes Driving: Yes Vocation: Retired Vision/Perception  Vision - History Ability to See in Adequate Light: 0 Adequate Perception Perception: Impaired Praxis Praxis: Impaired Praxis Impairment Details: Motor planning  Cognition Overall Cognitive Status: Impaired/Different from baseline Arousal/Alertness: Awake/alert Orientation Level: Oriented X4 Year: 2022 Month: December Day of Week: Correct Sustained Attention: Impaired Memory: Impaired Awareness: Impaired Problem Solving: Impaired Organizing: Impaired Safety/Judgment: Impaired Sensation Sensation Light Touch: Appears Intact Hot/Cold: Not tested Proprioception: Impaired by gross assessment Stereognosis: Appears Intact Coordination Gross Motor Movements are Fluid and Coordinated: No Fine Motor Movements are Fluid and Coordinated: No Heel Shin Test: slightly limited AROM R LE Motor  Motor Motor: Hemiplegia;Abnormal postural alignment and control Motor - Skilled Clinical Observations:  slight R hemi   Trunk/Postural Assessment  Cervical Assessment Cervical Assessment: Within Functional Limits Thoracic Assessment Thoracic Assessment: Within Functional Limits Lumbar Assessment Lumbar Assessment: Exceptions to [facility] (posterior pelvic tilt) Postural Control Postural Control: Deficits on evaluation Righting Reactions: delayed and inadequate Protective Responses: delayed and inadequate  Balance Balance Balance Assessed: Yes Standardized Balance Assessment Standardized Balance Assessment: Berg Balance Test Berg Balance Test Sit to Stand: Needs minimal aid to stand or to stabilize Standing Unsupported: Able to stand 2 minutes with supervision Sitting with Back Unsupported but Feet Supported on Floor or Stool: Able to sit 2 minutes under supervision Stand to Sit: Controls descent by using hands Transfers: Needs one person to assist Standing Unsupported with Eyes Closed: Able to stand 10 seconds with supervision Standing Ubsupported with Feet Together: Able  to place feet together independently and stand for 1 minute with supervision From Standing, Reach Forward with Outstretched Arm: Reaches forward but needs supervision From Standing Position, Pick up Object from Floor: Able to pick up shoe, needs supervision From Standing Position, Turn to Look Behind Over each Shoulder: Looks behind one side only/other side shows less weight shift Turn 360 Degrees: Needs close supervision or verbal cueing Standing Unsupported, Alternately Place Feet on Step/Stool: Needs assistance to keep from falling or unable to try Standing Unsupported, One Foot in Front: Able to take small step independently and hold 30 seconds Standing on One Leg: Tries to lift leg/unable to hold 3 seconds but remains standing independently Total Score: 28 Static Sitting Balance Static Sitting - Balance Support: Feet supported Static Sitting - Level of Assistance: 5: Stand by assistance Dynamic Sitting  Balance Dynamic Sitting - Balance Support: Feet supported;During functional activity Dynamic Sitting - Level of Assistance: 5: Stand by assistance Static Standing Balance Static Standing - Balance Support: No upper extremity supported Static Standing - Level of Assistance: 4: Min assist Dynamic Standing Balance Dynamic Standing - Balance Support: No upper extremity supported;During functional activity Dynamic Standing - Level of Assistance: 4: Min assist Extremity Assessment      RLE Assessment RLE Assessment: Exceptions to [facility] General Strength Comments: Grossly 4/5 LLE Assessment LLE Assessment: Exceptions to [facility] General Strength Comments: grossly 4+/5  Care Tool Care Tool Bed Mobility Roll left and right activity   Roll left and right assist level: Supervision/Verbal cueing    Sit to lying activity   Sit to lying assist level: Contact Guard/Touching assist    Lying to sitting on side of bed activity   Lying to sitting on side of bed assist level: the ability to move from lying on the back to sitting on the side of the bed with no back support.: Contact Guard/Touching assist     Care Tool Transfers Sit to stand transfer   Sit to stand assist level: Minimal Assistance - Patient > 75%    Chair/bed transfer   Chair/bed transfer assist level: Minimal Assistance - Patient > 75%     Toilet transfer   Assist Level: Minimal Assistance - Patient > 75%    Car transfer Car transfer activity did not occur: Environmental limitations        Care Tool Locomotion Ambulation   Assist level: Minimal Assistance - Patient > 75% Assistive device: Hand held assist Max distance: 160  Walk 10 feet activity   Assist level: Minimal Assistance - Patient > 75% Assistive device: Hand held assist   Walk 50 feet with 2 turns activity   Assist level: Minimal Assistance - Patient > 75% Assistive device: Hand held assist  Walk 150 feet activity   Assist level: Minimal Assistance - Patient >  75% Assistive device: Hand held assist  Walk 10 feet on uneven surfaces activity Walk 10 feet on uneven surfaces activity did not occur: Safety/medical concerns      Stairs   Assist level: Minimal Assistance - Patient > 75% Stairs assistive device: 2 hand rails Max number of stairs: 12  Walk up/down 1 step activity   Walk up/down 1 step (curb) assist level: Minimal Assistance - Patient > 75% Walk up/down 1 step or curb assistive device: 2 hand rails  Walk up/down 4 steps activity   Walk up/down 4 steps assist level: Minimal Assistance - Patient > 75% Walk up/down 4 steps assistive device: 2 hand rails  Walk up/down 12 steps  activity   Walk up/down 12 steps assist level: Minimal Assistance - Patient > 75% Walk up/down 12 steps assistive device: 2 hand rails  Pick up small objects from floor   Pick up small object from the floor assist level: Minimal Assistance - Patient > 75%    Wheelchair Is the patient using a wheelchair?: No          Wheel 50 feet with 2 turns activity      Wheel 150 feet activity        Refer to Care Plan for Long Term Goals  SHORT TERM GOAL WEEK 1 PT Short Term Goal 1 (Week 1): STG=LTG based on ELOS  Recommendations for other services: None   Skilled Therapeutic Intervention Mobility Transfers Transfers: Sit to Stand;Stand to Sit;Stand Pivot Transfers Sit to Stand: Minimal Assistance - Patient > 75% Stand to Sit: Minimal Assistance - Patient > 75% Stand Pivot Transfers: Minimal Assistance - Patient > 75% Stand Pivot Transfer Details: Verbal cues for safe use of DME/AE;Verbal cues for precautions/safety Transfer (Assistive device): Rolling walker Locomotion  Gait Ambulation: Yes Gait Assistance: Contact Guard/Touching assist;Minimal Assistance - Patient > 75% Gait Distance (Feet): 160 Feet Assistive device: Rolling walker Gait Assistance Details: Verbal cues for safe use of DME/AE;Verbal cues for precautions/safety;Verbal cues for gait  pattern Gait Gait: Yes Gait Pattern: Impaired Gait Pattern: Step-through pattern;Trunk flexed;Poor foot clearance - right;Poor foot clearance - left;Wide base of support;Trendelenburg;Decreased hip/knee flexion - right Gait velocity: Decreased Stairs / Additional Locomotion Stairs: Yes Stairs Assistance: Minimal Assistance - Patient > 75% Stair Management Technique: Two rails Number of Stairs: 12 Height of Stairs: 6 Ramp: Contact Guard/touching assist Curb: Contact Guard/Touching assist Wheelchair Mobility Wheelchair Mobility: No   Discharge Criteria: Patient will be discharged from PT if patient refuses treatment 3 consecutive times without medical reason, if treatment goals not met, if there is a change in medical status, if patient makes no progress towards goals or if patient is discharged from hospital.  The above assessment, treatment plan, treatment alternatives and goals were discussed and mutually agreed upon: by patient  Debbora Dus 06/29/2021, 10:57 AM

## 2021-06-29 NOTE — Progress Notes (Signed)
Inpatient Rehabilitation Care Coordinator Assessment and Plan Patient Details  Name: Sandra Becker MRN: [id number] Date of Birth: [birth year]  Today's Date: 06/29/2021  Hospital Problems: Principal Problem:   Infarction of left basal ganglia [facility])  Past Medical History:  Past Medical History:  Diagnosis Date   Breast cancer ([place]) 1993   bilateral mastectomy; no chemo or radiation   Glaucoma    Hyperlipidemia    Hypertension    Past Surgical History:  Past Surgical History:  Procedure Laterality Date   MASTECTOMY Bilateral [place]   Social History:  reports that she quit smoking about 57 years ago. She has never used smokeless tobacco. She reports current alcohol use of about 21.0 standard drinks per week. She reports that she does not use drugs.  Family / Support Systems Marital Status: Married Patient Roles: Spouse Spouse/Significant Other: [name] Anticipated Caregiver: Maurice Ability/Limitations of Caregiver: none Caregiver Availability: 24/7 Family Dynamics: support from spouse  Social History Preferred language: English Religion: [employer] - How often do you need to have someone help you when you read instructions, pamphlets, or other written material from your doctor or pharmacy?: Never Writes: Yes Employment Status: Retired Public relations account executive Issues: n/a Guardian/Conservator: spouse   Abuse/Neglect Abuse/Neglect Assessment Can Be Completed: Yes Physical Abuse: Denies Verbal Abuse: Denies Sexual Abuse: Denies Exploitation of patient/patient's resources: Denies Self-Neglect: Denies  Patient response to: Social Isolation - How often do you feel lonely or isolated from those around you?: Never  Emotional Status Recent Psychosocial Issues: n/a Psychiatric History: n/a Substance Abuse History: quit smoking 57 years ago  Patient / Family Perceptions, Expectations  & Goals Pt/Family understanding of illness & functional limitations: yes Premorbid pt/family roles/activities: independent and active Anticipated changes in roles/activities/participation: spouse and family able to assit Pt/family expectations/goals: Supervision to [place]: None Premorbid Home Care/DME Agencies: Other (Comment) Librarian, academic, Radio broadcast assistant) Transportation available at discharge: Arranged with [facility] Is the patient able to respond to transportation needs?: Yes In the past 12 months, has lack of transportation kept you from medical appointments or from getting medications?: No In the past 12 months, has lack of transportation kept you from meetings, work, or from getting things needed for daily living?: No  Discharge Planning Living Arrangements: Spouse/significant other Support Systems: Spouse/significant other Type of Residence: Independent Living Environmental education officer) Insurance Resources: Information systems manager, Multimedia programmer (specify) Web designer) Financial Resources: Radio broadcast assistant Screen Referred: No Living Expenses: Other (Comment) (Independent Living) Money Management: Patient, Spouse Does the patient have any problems obtaining your medications?: No Home Management: Independent Patient/Family Preliminary Plans: spouse and family able to [place] Coordinator Anticipated Follow Up Needs: HH/OP Expected length of stay: 7-10 Days  Clinical Impression SW met with pt introduced self, explained role and addressed question. S will follow up with patient to next week  Dyanne Iha 06/29/2021, 12:30 PM

## 2021-06-30 NOTE — Progress Notes (Signed)
Occupational Therapy Session Note  Patient Details  Name: Sandra Becker MRN: [id number] Date of Birth: 30-Jan-1941  Today's Date: 06/30/2021 OT Individual Time: 0910-1003 OT Individual Time Calculation (min): 53 min    Short Term Goals: Week 1:  OT Short Term Goal 1 (Week 1): Pt will complete LB selfcare with supervision sit to stand. OT Short Term Goal 2 (Week 1): Pt will complete LB dressing with supervision sit to stand. OT Short Term Goal 3 (Week 1): Pt will complete toilet transfers with supervision using the RW for support. OT Short Term Goal 4 (Week 1): Pt will complete walk-in shower transfers with use of the RW and close supervision.  Skilled Therapeutic Interventions/Progress Updates:    Pt received seated on toilet with NT present, no c/o pain and, agreeable to therapy. Session focus on self-care retraining, activity tolerance, RUE NMR/attention, transfer retraining in prep for improved ADL/IADL/func mobility performance + decreased caregiver burden. NT reports pt having just showered seated on TTB with set-up A. Pt finished donning underwear with CGA from toilet. Ambulated > room with CGA overall and light assist to keep RUE on RW due to inattention. Able to retrieve clothing from low height drawers with min A for balance and to shut drawers again. Completed UB dressing while seated with S, LBD with CGA for sit to stand and min cues for safety awareness as pt attempted to standing with only one shoe donned. Stood at sink to complete grooming tasks with CGA and assist to locate desired lotion in room.   Assessed B grip strength and administered 9HPT with the following results:  R: unable to achieve past 0 lbs, 1 min 13 secs  L: 25, 16, 0 ; average of 13.67 lbs; 38 secs   -Required min cues throughout 9HPT to adhere to rules  Issued medium soft theraputty and pt practiced completing the following exercises with R hand : putty squeezes, inserting/removing beads, rolling into  ball/log shapes, digit flexion/extension, and pinch and pull.  Pt practiced writing her name and phone number with her dominant R hand. Handwriting 90% legible, pt rating it as "an F" compared to baseline handwriting. Encouraged pt to practice handwriting and other fine motor activities such as therapy, flipping cards, etc. In between therapies.   Pt left seated in recliner with safety belt alarm engaged, call bell in reach, and all immediate needs met.    Therapy Documentation Precautions:  Precautions Precautions: Fall Precaution Comments: slight right inattention, right hemi paresis, impulsive Restrictions Weight Bearing Restrictions: No  Pain: No c/o throughout session  ADL: See Care Tool for more details.  Therapy/Group: Individual Therapy  Volanda Napoleon MS, OTR/L  06/30/2021, 6:51 AM

## 2021-06-30 NOTE — Progress Notes (Signed)
Speech Language Pathology Daily Session Note  Patient Details  Name: Sandra Becker MRN: [id number] Date of Birth: October 01, 1940  Today's Date: 06/30/2021 SLP Individual Time: 1300-1345 SLP Individual Time Calculation (min): 45 min  Short Term Goals: Week 1: SLP Short Term Goal 1 (Week 1): STG=LTG due to ELOS  Skilled Therapeutic Interventions: Skilled SLP intervention focused on verbal expression. Pt named objects when given description on 8/10 opportunities. She  was able to answer conversational questions about her personal background with min A for word finding. She was able to effectively communicate her thoughts with occasional halting and anomic episodes. She increased accuracy with decreased environmental noise and pausing to retrieve  words. She participated well in skilled SLP intervention. Cont with therapy per plan of care.      Pain Pain Assessment Pain Scale: Faces Pain Score: 0-No pain Faces Pain Scale: No hurt  Therapy/Group: Individual Therapy  Darrol Poke Peniel Hass 06/30/2021, 1:36 PM

## 2021-06-30 NOTE — Progress Notes (Signed)
Physical Therapy Session Note  Patient Details  Name: Sandra Becker MRN: [id number] Date of Birth: 11-04-1940  Today's Date: 06/30/2021 PT Individual Time: 1140-1203 and 1420-1535 PT Individual Time Calculation (min): 23 min and 75 min   Short Term Goals: Week 1:  PT Short Term Goal 1 (Week 1): STG=LTG based on ELOS  Skilled Therapeutic Interventions/Progress Updates:    Session 1: Pt received sitting in recliner and agreeable to therapy session. Sit<>stands using RW with CGA - cuing to push up from seat. Gait training ~140ft to main therapy gym using RW with CGA for safety - continues to demo some R inattention requiring increased cuing to orient to items on that side including ensuring correct R hand placement on RW.  Gait training ~154ft, no AD, with light min assist for balance - continues to have reciprocal stepping pattern with slightly wider BOS and mildly increased postural sway.   Stair navigation training 12 steps using B HRs with light min assist - reciprocal pattern in both directions targeting R LE NMR - no knee instability noted - cuing for R hand attention as pt frequently leaving it behind on the HR.  Gait training ~135ft back to room, no AD, with continued light min assist for balance as described above - working on R attention and memory/recall to have pt locate her room (couldn't recall her room number). At end of session, pt left seated in recliner with needs in reach and seat belt alarm on.    Session 2: Pt received sitting in recliner and agreeable to therapy session. Sit>stand, no AD, pushing B UEs on armrests to rise to standing. Gait training ~127ft to ADL apartment, no AD, with light min assist for balance - continues to have slower gait speed with reciprocal pattern and wide BOS. Dynamic gait training, R attention, and R UE NMR task in ADL apartment by having pt "set the table as if the 2 of [place] were going to have a meal" - pt able to use the cabinet labels to  locate items in a reasonable amount of time - therapist had to repeatedly cue to use R UE as opposed to L (ended up having pt place L hand behind her back) and is able to manipulate multiple silverware in her R hand to sort them when placing them back in the drawer - able to correctly select items needed for this task without cuing but increased time to locate items on the R. Furniture transfer to/from recliner, no AD, with min assist for controlled descent and powering up into standing from this lower seat height. Dynamic gait training in ADL apartment focusing on balance, R UE NMR, R attention, problem solving, and sequencing while pretending to make pasta salad - pt initially unable to find the cooking instructions on the box and becoming frustrated with therapist's questions about how to actually perform this task as pt states it is unfamiliar and appears frustrated by these simple questions but also having difficulty answering them, but once explained that pt's goal is to actually go through the steps of cooking she became more agreeable but continues to require max cuing for sequencing and locating instructions on R side of box.   Standing balance with R UE NMR and R visual scanning/attention tasks using BITS sequence of alphabet progressed to sequencing number/letter - pt frequently bumping R hand on the screen with poor proprioceptive awareness of where her hand was at in space - able to sequence letters with increased time, but  then when alternating numbers/letters once got to "I" when she lost track of the sequence requiring mod verbal cuing to continue correct order - CGA for balance safety throughout.   Sit<>stand from standard height chair without armrests with min  assist for lifting to stand. Gait training ~258ft back to room, no AD, with light min assist for balance - had pt navigate back to room with only 1x cuing (though not able to verbally state her room number, she could locate it) -  continues to have slightly wider BOS with increased postural sway and slight R anterior lean.   Sit>supine with supervision and increased time/effort. Pt left supine in bed with needs in reach and bed alarm on.   Therapy Documentation Precautions:  Precautions Precautions: Fall Precaution Comments: slight right inattention, right hemi paresis, impulsive Restrictions Weight Bearing Restrictions: No   Pain:  Session 1: Denies pain during session.  Session 2: Denies pain during session.     Therapy/Group: Individual Therapy  Tawana Scale , PT, DPT, NCS, CSRS  06/30/2021, 8:02 AM

## 2021-07-01 DIAGNOSIS — E785 Hyperlipidemia, unspecified: Secondary | ICD-10-CM

## 2021-07-01 DIAGNOSIS — E871 Hypo-osmolality and hyponatremia: Secondary | ICD-10-CM

## 2021-07-01 DIAGNOSIS — I10 Essential (primary) hypertension: Secondary | ICD-10-CM | POA: Diagnosis not present

## 2021-07-01 DIAGNOSIS — I639 Cerebral infarction, unspecified: Secondary | ICD-10-CM | POA: Diagnosis not present

## 2021-07-01 MED ORDER — LOSARTAN POTASSIUM 50 MG PO TABS
25.0000 mg | ORAL_TABLET | Freq: Every day | ORAL | Status: DC
  Administered 2021-07-02 – 2021-07-04 (×3): 25 mg via ORAL
  Filled 2021-07-01 (×3): qty 1

## 2021-07-01 NOTE — Discharge Instructions (Addendum)
Inpatient Rehab Discharge Instructions  Sandra Becker Discharge date and time: No discharge date for patient encounter.   Activities/Precautions/ Functional Status: Activity: As tolerated Diet: Regular Wound Care: Routine skin checks Functional status:  ___ No restrictions     ___ Walk up steps independently ___ 24/7 supervision/assistance   ___ Walk up steps with assistance ___ Intermittent supervision/assistance  ___ Bathe/dress independently ___ Walk with walker     _x__ Bathe/dress with assistance ___ Walk Independently    ___ Shower independently ___ Walk with assistance    ___ Shower with assistance ___ No alcohol     ___ Return to work/school ________  COMMUNITY REFERRALS UPON DISCHARGE:    Outpatient: PT     OT    ST                Agency:[facility]  Phone: [phone]              Appointment Date/Time: TBD    Special Instructions: No driving smoking or alcohol  Plan aspirin 81 mg daily and Brilinta 90 mg twice daily until 07/25/2021 then begin Plavix 75 mg daily beginning 07/26/2021   My questions have been answered and I understand these instructions. I will adhere to these goals and the provided educational materials after my discharge from the hospital.  Patient/Caregiver Signature _______________________________ Date __________  Clinician Signature _______________________________________ Date __________  Please bring this form and your medication list with you to all your follow-up doctor's appointments.  STROKE/TIA DISCHARGE INSTRUCTIONS SMOKING Cigarette smoking nearly doubles your risk of having a stroke & is the single most alterable risk factor  If you smoke or have smoked in the last 12 months, you are advised to quit smoking for your health. Most of the excess cardiovascular risk related to smoking disappears within a year of stopping. Ask you doctor about anti-smoking medications [place] Quit Line: 1-800-QUIT NOW Free Smoking Cessation Classes [phone]  [phone]  CHOLESTEROL Know your levels; limit fat & cholesterol in your diet  Lipid Panel     Component Value Date/Time   CHOL 183 06/25/2021 1403   TRIG 104 06/25/2021 1403   HDL 87 06/25/2021 1403   CHOLHDL 2.1 06/25/2021 1403   VLDL 21 06/25/2021 1403   LDLCALC 75 06/25/2021 1403     Many patients benefit from treatment even if their cholesterol is at goal. Goal: Total Cholesterol (CHOL) less than 160 Goal:  Triglycerides (TRIG) less than 150 Goal:  HDL greater than 40 Goal:  LDL (LDLCALC) less than 100   BLOOD PRESSURE American Stroke Association blood pressure target is less that 120/80 mm/Hg  Your discharge blood pressure is:  BP: (!) 147/69 Monitor your blood pressure Limit your salt and alcohol intake Many individuals will require more than one medication for high blood pressure  DIABETES (A1c is a blood sugar average for last 3 months) Goal HGBA1c is under 7% (HBGA1c is blood sugar average for last 3 months)  Diabetes: No known diagnosis of diabetes    Lab Results  Component Value Date   HGBA1C 5.7 (H) 06/25/2021    Your HGBA1c can be lowered with medications, healthy diet, and exercise. Check your blood sugar as directed by your physician Call your physician if you experience unexplained or low blood sugars.  PHYSICAL ACTIVITY/REHABILITATION Goal is 30 minutes at least 4 days per week  Activity: Increase activity slowly, Therapies: Physical Therapy: Home Health Return to work:  Activity decreases your risk of heart attack and stroke and makes  your heart stronger.  It helps control your weight and blood pressure; helps you relax and can improve your mood. Participate in a regular exercise program. Talk with your doctor about the best form of exercise for you (dancing, walking, swimming, cycling).  DIET/WEIGHT Goal is to maintain a healthy weight  Your discharge diet is:  Diet Order             Diet regular Room service appropriate? Yes; Fluid consistency: Thin   Diet effective now                   liquids Your height is:  Height: 5\' 5"  (165.1 cm) Your current weight is: Weight: 78.2 kg Your Body Mass Index (BMI) is:  BMI (Calculated): 28.69 Following the type of diet specifically designed for you will help prevent another stroke. Your goal weight range is:   Your goal Body Mass Index (BMI) is 19-24. Healthy food habits can help reduce 3 risk factors for stroke:  High cholesterol, hypertension, and excess weight.  RESOURCES Stroke/Support Group:  Call [phone]   STROKE EDUCATION PROVIDED/REVIEWED AND GIVEN TO PATIENT Stroke warning signs and symptoms How to activate emergency medical system (call 911). Medications prescribed at discharge. Need for follow-up after discharge. Personal risk factors for stroke. Pneumonia vaccine given: No Flu vaccine given: No My questions have been answered, the writing is legible, and I understand these instructions.  I will adhere to these goals & educational materials that have been provided to me after my discharge from the hospital.  ---------------------------------------------------------------------------------------------------------------------------------------------------------------------------- .Sandra Becker

## 2021-07-01 NOTE — Progress Notes (Signed)
PROGRESS NOTE   Subjective/Complaints: Patient seen sitting up in bed this AM, eating breakfast this AM.  Good sitting balance noted. She states she slept well overnight.    ROS: Denies CP, SOB, N/V/D  Objective:   No results found. Recent Labs    06/29/21 0629  WBC 8.9  HGB 13.2  HCT 39.8  PLT 360    Recent Labs    06/29/21 0629  NA 131*  K 3.7  CL 100  CO2 25  GLUCOSE 113*  BUN 15  CREATININE 0.50  CALCIUM 8.8*     Intake/Output Summary (Last 24 hours) at 07/01/2021 1805 Last data filed at 07/01/2021 1305 Gross per 24 hour  Intake 720 ml  Output --  Net 720 ml         Physical Exam: Vital Signs Blood pressure (!) 171/80, pulse 90, temperature 97.8 F (36.6 C), resp. rate 16, height 5\' 5"  (1.651 m), weight 78.2 kg, SpO2 100 %. Constitutional: No distress . Vital signs reviewed. HENT: Normocephalic.  Atraumatic. Eyes: EOMI. No discharge. Cardiovascular: No JVD.  RRR. Respiratory: Normal effort.  No stridor.  Bilateral clear to auscultation. GI: Non-distended.  BS +. Skin: Warm and dry.  Intact. Psych: Normal mood.  Normal behavior. Musc: No edema in extremities.  No tenderness in extremities. Neuro: Alert Mild/Mod Expressive deficits Motor: Grossly 5/5 throughout No ataxia b/l UE  Assessment/Plan: 1. Functional deficits which require 3+ hours per day of interdisciplinary therapy in a comprehensive inpatient rehab setting. Physiatrist is providing close team supervision and 24 hour management of active medical problems listed below. Physiatrist and rehab team continue to assess barriers to discharge/monitor patient progress toward functional and medical goals  Care Tool:  Bathing    Body parts bathed by patient: Right arm, Left arm, Chest, Abdomen, Front perineal area, Buttocks, Right upper leg, Left upper leg, Right lower leg, Left lower leg, Face         Bathing assist Assist Level: Set  up assist     Upper Body Dressing/Undressing Upper body dressing   What is the patient wearing?:  (personal nightgown)    Upper body assist Assist Level: Independent    Lower Body Dressing/Undressing Lower body dressing      What is the patient wearing?: Underwear/pull up     Lower body assist Assist for lower body dressing: Contact Guard/Touching assist     Toileting Toileting    Toileting assist Assist for toileting: Supervision/Verbal cueing     Transfers Chair/bed transfer  Transfers assist     Chair/bed transfer assist level: Contact Guard/Touching assist Chair/bed transfer assistive device: Armrests, Programmer, multimedia   Ambulation assist      Assist level: Minimal Assistance - Patient > 75% Assistive device: No Device Max distance: 181ft   Walk 10 feet activity   Assist     Assist level: Minimal Assistance - Patient > 75% Assistive device: No Device   Walk 50 feet activity   Assist    Assist level: Minimal Assistance - Patient > 75% Assistive device: No Device    Walk 150 feet activity   Assist    Assist level: Minimal Assistance -  Patient > 75% Assistive device: No Device    Walk 10 feet on uneven surface  activity   Assist Walk 10 feet on uneven surfaces activity did not occur: Safety/medical concerns         Wheelchair     Assist Is the patient using a wheelchair?: No             Wheelchair 50 feet with 2 turns activity    Assist            Wheelchair 150 feet activity     Assist          Blood pressure (!) 171/80, pulse 90, temperature 97.8 F (36.6 C), resp. rate 16, height 5\' 5"  (1.651 m), weight 78.2 kg, SpO2 100 %.  Medical Problem List and Plan: 1.  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  Plan 30-day cardiac event monitor Continue  CIR 2.  Antithrombotics: -DVT/anticoagulation:   need to determine if needs Lovenox             -antiplatelet therapy: Aspirin 81 mg daily and Brilinta 90 mg twice daily x30 days then Plavix alone 3. Pain Management: Tylenol as needed 4. Mood: Provide emotional support             -antipsychotic agents: N/A 5. Neuropsych: This patient is capable of making decisions on her own behalf. 6. Skin/Wound Care: Routine skin checks 7. Fluids/Electrolytes/Nutrition: Routine in and outs  8.  Hyperlipidemia.  Crestor 9.  Glaucoma.  Continue eyedrops 10.  History of breast cancer status post bilateral mastectomy.  Follow-up outpatient 11.  Hypertension.  Chlorthalidone discontinued due to bouts of hyponatremia.  Patient on valsartan 325 mg daily as well as Cozaar 50 mg daily prior to admission.  Resume as needed Vitals:   07/01/21 0349 07/01/21 1316  BP: (!) 169/70 (!) 171/80  Pulse: 77 90  Resp: 14 16  Temp: 97.9 F (36.6 C) 97.8 F (36.6 C)  SpO2: 99% 100%   Cozaar 25 daily started on 12/5 12. Hyponatremia  Sodium 131 on 12/2, labs ordered for tomorrow  LOS: 3 days A FACE TO FACE EVALUATION WAS PERFORMED  Jacilyn Sanpedro Lorie Phenix 07/01/2021, 6:05 PM

## 2021-07-02 DIAGNOSIS — I639 Cerebral infarction, unspecified: Secondary | ICD-10-CM | POA: Diagnosis not present

## 2021-07-02 LAB — BASIC METABOLIC PANEL
Anion gap: 5 (ref 5–15)
BUN: 12 mg/dL (ref 8–23)
CO2: 29 mmol/L (ref 22–32)
Calcium: 9.1 mg/dL (ref 8.9–10.3)
Chloride: 98 mmol/L (ref 98–111)
Creatinine, Ser: 0.61 mg/dL (ref 0.44–1.00)
GFR, Estimated: >60 mL/min (ref >60)
Glucose, Bld: 113 mg/dL — ABNORMAL HIGH (ref 70–99)
Potassium: 5.3 mmol/L — ABNORMAL HIGH (ref 3.5–5.1)
Sodium: 132 mmol/L — ABNORMAL LOW (ref 135–145)

## 2021-07-02 NOTE — Progress Notes (Signed)
Physical Therapy Session Note  Patient Details  Name: Sandra Becker MRN: [id number] Date of Birth: April 19, 1941  Today's Date: 07/02/2021 PT Individual Time: 1330-1425 PT Individual Time Calculation (min): 55 min   Short Term Goals: Week 1:  PT Short Term Goal 1 (Week 1): STG=LTG based on ELOS  Skilled Therapeutic Interventions/Progress Updates:    Patient received sitting up in wc, agreeable to PT. She denies pain. Patient ambulating to therapy gym with no AD and light CGA. Slow gait speed, slightly WBOS and minor trendelenburg noted. Standing dual cog + motor task matching playing cards with board biased to R. Patient requiring verbal cues x2 to scan fully to the R, but no additional cues for accuracy. Sit > stand x7 with R LE biased on Airex, 2x7 with R LE on airex and L LE on wedge. Patient with tendency to brace B LE on mat to assist with powering up. Anticipatory and reactionary balance practice with 2kg ball toss at rebounder. Normal BOS, NBOS, semi-tandem with appropriate ankle strategy noted throughout. Patient completed 2x4' on NuStep with B UE/LE for improved endurance. She ambulated back to her room with close supervision. Patient able to doff sneakers, jacket, hearing aids edge of bed with supervision. Handoff to NT.   Therapy Documentation Precautions:  Precautions Precautions: Fall Precaution Comments: slight right inattention, right hemi paresis, impulsive Restrictions Weight Bearing Restrictions: No     Therapy/Group: Individual Therapy  Karoline Caldwell, PT, DPT, CBIS  07/02/2021, 7:42 AM

## 2021-07-02 NOTE — Progress Notes (Signed)
Occupational Therapy Session Note  Patient Details  Name: Sandra Becker MRN: [id number] Date of Birth: Jul 01, 1941  Today's Date: 07/02/2021 OT Individual Time: 1002-1059 OT Individual Time Calculation (min): 57 min    Short Term Goals: Week 1:  OT Short Term Goal 1 (Week 1): Pt will complete LB selfcare with supervision sit to stand. OT Short Term Goal 2 (Week 1): Pt will complete LB dressing with supervision sit to stand. OT Short Term Goal 3 (Week 1): Pt will complete toilet transfers with supervision using the RW for support. OT Short Term Goal 4 (Week 1): Pt will complete walk-in shower transfers with use of the RW and close supervision.  Skilled Therapeutic Interventions/Progress Updates:    Pt in recliner to start session with completion of functional mobility down to the ortho gym with min guard assist and use of the RW for support.  Once in the gym, had her focus on use of the BITs for visual scanning as well as for RUE function.  She was able to stand with min assist and no device for completion of all tasks.  Visual scanning was completed with use of the RUE only and and average of 2 seconds for reaction time and then 84% accuracy.  Slightly slower location of dots on the right at 2.1 seconds compared to 1.7 on the left, but not a significant difference.  She completed Visual Pursuits program with an average of 3.8 seconds between blue dots with a time of 1:36 to complete numbers 1-25, again with the left hand.  Progressed to completion of word memory program for 5 mins with pt completing up to 6/7 word sequence, but variable, dropping all the way back to 3 words at times.  Finished with use of the BITs to complete [employer] at 25% accuracy.  Had her ambulate back to the room with min guard assist with use of the RW and then completing toilet transfer at min assist without an assistive device.  She washed her hands at the sink with min instructional cueing and then transferred  over to the recliner to rest.  Call button and phone in reach with safety alarm in place.    Therapy Documentation Precautions:  Precautions Precautions: Fall Precaution Comments: slight right inattention, right hemi paresis, impulsive Restrictions Weight Bearing Restrictions: No  Pain: Pain Assessment Pain Scale: Faces Pain Score: 0-No pain ADL: See Care Too Section for some details of mobility and selfcare  Therapy/Group: Individual Therapy  Nikos Anglemyer OTR/L 07/02/2021, 12:50 PM

## 2021-07-02 NOTE — Progress Notes (Signed)
Physical Therapy Session Note  Patient Details  Name: Sandra Becker MRN: [id number] Date of Birth: 01-03-1941  Today's Date: 07/02/2021 PT Individual Time: 0800-0905 PT Individual Time Calculation (min): 65 min  PT Amount of Missed Time (min): 10 Minutes PT Missed Treatment Reason: Patient fatigue  Short Term Goals: Week 1:  PT Short Term Goal 1 (Week 1): STG=LTG based on ELOS  Skilled Therapeutic Interventions/Progress Updates:    Pt received seated in recliner in room, agreeable to PT session. No complaints of pain this AM. Pt requesting to get dressed prior to leaving room this AM. Sit to stand with CGA with and without AD this AM with cues for safe UE placement during transfer. Ambulation around hospital room with no AD and CGA while gathering clothing and toiletry items, min A for balance with leaning over to reach into drawers and retrieve items from the floor. Pt is able to get dressed independently with cues for safety (ie donning underwear and pants in seated position then standing up to pull up over hips). Toilet transfer with Supervision, independent for clothing management and pericare. Standing balance at sink while washing face and performing oral hygiene, Supervision for standing balance. Ambulation x 150 ft with RW and CGA for balance, path deviation to the L and inattention to environment in R visual field. Ambulation with no AD and CGA with same deficits and path deviations noted. Pt exhibits no improvement in balance with use of RW vs no AD although she does tend to "furniture walk" in environments where objects available to reach for, requires cues for safety. Ambulation through obstacle course weaving through cones and stepping up/down 4" step. Pt exhibits decreased attention to RLE when stepping up/down 4" step with several LOB with min to mod A needed to recover. With cues to attend to RLE pt exhibits improved control of limb. Standing alt L/R 4" step taps with no UE support  and min A for balance, increased difficulty with RLE as compared to LLE. Sidesteps L/R 4 x 10 ft with no AD and min A for balance with cues for increased LE clearance with visual cues for path and cues to keep head up. Pt requests to return to room due to fatigue. Ambulation back to room with no AD and CGA, cues to attend to R visual field. Pt left seated in recliner in room with needs in reach, quick release belt and chair alarm in place. Pt missed 10 min of scheduled therapy session due to fatigue.  Therapy Documentation Precautions:  Precautions Precautions: Fall Precaution Comments: slight right inattention, right hemi paresis, impulsive Restrictions Weight Bearing Restrictions: No    Therapy/Group: Individual Therapy   Excell Seltzer, PT, DPT, CSRS  07/02/2021, 12:17 PM

## 2021-07-02 NOTE — Progress Notes (Signed)
Physical Therapy Session Note  Patient Details  Name: Sandra Becker MRN: [id number] Date of Birth: Jul 10, 1941  Today's Date: 07/02/2021 PT Individual Time: 1100-1130 PT Individual Time Calculation (min): 30 min   Short Term Goals: Week 1:  PT Short Term Goal 1 (Week 1): STG=LTG based on ELOS  Skilled Therapeutic Interventions/Progress Updates: Pt presents sitting in recliner, just finished w/ OT, so states fatigue.  Pt agreeable to therapy.  Pt transfers sit to stand w/ CGA to close supervision, but slow transition.  Pt amb to Dayroom w/o AD and min to CGA. Pt amb w/ reciprocal gait pattern, but as fatigues, R foot more likely to scuff on floor w/ swing-through.  Pt encouraged to relax shoulders and improve arm swing.  Pt performed cone obstacle course negotiation, then toe taps to cones as performs (requires HHA for safety) and then picking up cones w/ alternating hands.  Pt amb w/ marching, quick stop/starts and then attention to right and left to describe items/places on walls as amb down hallway.  Pt amb multiple trials w/o AD up to 150' w/ min to CGA.  Pt returned to room and remained sitting in recliner w/ LEs elevated.  Chair alarm on and all needs in reach.     Therapy Documentation Precautions:  Precautions Precautions: Fall Precaution Comments: slight right inattention, right hemi paresis, impulsive Restrictions Weight Bearing Restrictions: No General:   Vital Signs:  Pain:0/10.   :      Therapy/Group: Individual Therapy  Ladoris Gene 07/02/2021, 11:29 AM

## 2021-07-02 NOTE — Progress Notes (Signed)
PROGRESS NOTE   Subjective/Complaints:  No issues overnite, pt able to read novel .  \Reviewed labs   ROS: Denies CP, SOB, N/V/D  Objective:   No results found. No results for input(s): WBC, HGB, HCT, PLT in the last 72 hours.  Recent Labs    07/02/21 0528  NA 132*  K 5.3*  CL 98  CO2 29  GLUCOSE 113*  BUN 12  CREATININE 0.61  CALCIUM 9.1     Intake/Output Summary (Last 24 hours) at 07/02/2021 1000 Last data filed at 07/02/2021 0740 Gross per 24 hour  Intake 836 ml  Output --  Net 836 ml         Physical Exam: Vital Signs Blood pressure (!) 147/69, pulse 69, temperature 98.3 F (36.8 C), resp. rate 14, height 5\' 5"  (1.651 m), weight 78.2 kg, SpO2 96 %.  General: No acute distress Mood and affect are appropriate Heart: Regular rate and rhythm no rubs murmurs or extra sounds Lungs: Clear to auscultation, breathing unlabored, no rales or wheezes Abdomen: Positive bowel sounds, soft nontender to palpation, nondistended Extremities: No clubbing, cyanosis, or edema Skin: No evidence of breakdown, no evidence of rash   Neuro: Alert Mild/Mod Expressive deficits Motor: Grossly 5/5 in BUE an BLE +dysdiadochokinesis RUE, Reduced finger to thumb opposition  No ataxia b/l UE  Assessment/Plan: 1. Functional deficits which require 3+ hours per day of interdisciplinary therapy in a comprehensive inpatient rehab setting. Physiatrist is providing close team supervision and 24 hour management of active medical problems listed below. Physiatrist and rehab team continue to assess barriers to discharge/monitor patient progress toward functional and medical goals  Care Tool:  Bathing    Body parts bathed by patient: Right arm, Left arm, Chest, Abdomen, Front perineal area, Buttocks, Right upper leg, Left upper leg, Right lower leg, Left lower leg, Face         Bathing assist Assist Level: Set up assist      Upper Body Dressing/Undressing Upper body dressing   What is the patient wearing?:  (personal nightgown)    Upper body assist Assist Level: Independent    Lower Body Dressing/Undressing Lower body dressing      What is the patient wearing?: Underwear/pull up     Lower body assist Assist for lower body dressing: Contact Guard/Touching assist     Toileting Toileting    Toileting assist Assist for toileting: Supervision/Verbal cueing     Transfers Chair/bed transfer  Transfers assist     Chair/bed transfer assist level: Contact Guard/Touching assist Chair/bed transfer assistive device: Armrests, Programmer, multimedia   Ambulation assist      Assist level: Minimal Assistance - Patient > 75% Assistive device: No Device Max distance: 169ft   Walk 10 feet activity   Assist     Assist level: Minimal Assistance - Patient > 75% Assistive device: No Device   Walk 50 feet activity   Assist    Assist level: Minimal Assistance - Patient > 75% Assistive device: No Device    Walk 150 feet activity   Assist    Assist level: Minimal Assistance - Patient > 75% Assistive device: No Device  Walk 10 feet on uneven surface  activity   Assist Walk 10 feet on uneven surfaces activity did not occur: Safety/medical concerns         Wheelchair     Assist Is the patient using a wheelchair?: No             Wheelchair 50 feet with 2 turns activity    Assist            Wheelchair 150 feet activity     Assist          Blood pressure (!) 147/69, pulse 69, temperature 98.3 F (36.8 C), resp. rate 14, height 5\' 5"  (1.651 m), weight 78.2 kg, SpO2 96 %.  Medical Problem List and Plan: 1.  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  Plan 30-day cardiac event monitor Continue CIR PT, OT, SLP   2.  Antithrombotics: -DVT/anticoagulation:   need to determine if needs Lovenox             -antiplatelet therapy: Aspirin 81 mg daily and Brilinta 90 mg twice daily x30 days then Plavix alone 3. Pain Management: Tylenol as needed 4. Mood: Provide emotional support             -antipsychotic agents: N/A 5. Neuropsych: This patient is capable of making decisions on her own behalf. 6. Skin/Wound Care: Routine skin checks 7. Fluids/Electrolytes/Nutrition: Routine in and outs  8.  Hyperlipidemia.  Crestor 9.  Glaucoma.  Continue eyedrops 10.  History of breast cancer status post bilateral mastectomy.  Follow-up outpatient 11.  Hypertension.  Chlorthalidone discontinued due to bouts of hyponatremia.  Patient on valsartan 325 mg daily as well as Cozaar 50 mg daily prior to admission.  Resume as needed Vitals:   07/01/21 1948 07/02/21 0427  BP:  (!) 147/69  Pulse: 76 69  Resp: 14 14  Temp: 97.9 F (36.6 C) 98.3 F (36.8 C)  SpO2: 99% 96%   Cozaar 25 daily started on 12/5 12. Hyponatremia  Sodium 131 on 12/2, labs ordered for tomorrow  LOS: 4 days A FACE TO [place] E Xaiden Fleig 07/02/2021, 10:00 AM

## 2021-07-03 ENCOUNTER — Other Ambulatory Visit (HOSPITAL_COMMUNITY): Payer: Self-pay

## 2021-07-03 DIAGNOSIS — I639 Cerebral infarction, unspecified: Secondary | ICD-10-CM | POA: Diagnosis not present

## 2021-07-03 LAB — BASIC METABOLIC PANEL
Anion gap: 9 (ref 5–15)
BUN: 10 mg/dL (ref 8–23)
CO2: 23 mmol/L (ref 22–32)
Calcium: 8.9 mg/dL (ref 8.9–10.3)
Chloride: 100 mmol/L (ref 98–111)
Creatinine, Ser: 0.53 mg/dL (ref 0.44–1.00)
GFR, Estimated: >60 mL/min (ref >60)
Glucose, Bld: 102 mg/dL — ABNORMAL HIGH (ref 70–99)
Potassium: 4.3 mmol/L (ref 3.5–5.1)
Sodium: 132 mmol/L — ABNORMAL LOW (ref 135–145)

## 2021-07-03 NOTE — Progress Notes (Signed)
PROGRESS NOTE   Subjective/Complaints:  Pt reading book in recliner , oriented x 3  Discussed BP management with pt  ROS: Denies CP, SOB, N/V/D  Objective:   No results found. No results for input(s): WBC, HGB, HCT, PLT in the last 72 hours.  Recent Labs    07/02/21 0528 07/03/21 0512  NA 132* 132*  K 5.3* 4.3  CL 98 100  CO2 29 23  GLUCOSE 113* 102*  BUN 12 10  CREATININE 0.61 0.53  CALCIUM 9.1 8.9     Intake/Output Summary (Last 24 hours) at 07/03/2021 0912 Last data filed at 07/03/2021 0740 Gross per 24 hour  Intake 720 ml  Output --  Net 720 ml         Physical Exam: Vital Signs Blood pressure (!) 155/89, pulse 74, temperature 98.1 F (36.7 C), resp. rate 16, height 5\' 5"  (1.651 m), weight 78.2 kg, SpO2 98 %.   General: No acute distress Mood and affect are appropriate Heart: Regular rate and rhythm no rubs murmurs or extra sounds Lungs: Clear to auscultation, breathing unlabored, no rales or wheezes Abdomen: Positive bowel sounds, soft nontender to palpation, nondistended Extremities: No clubbing, cyanosis, or edema Skin: No evidence of breakdown, no evidence of rash  Neuro: Alert Mild/Mod Expressive deficits Motor: Grossly 5/5 in BUE an BLE +dysdiadochokinesis RUE, Reduced finger to thumb opposition  No ataxia b/l UE  Assessment/Plan: 1. Functional deficits which require 3+ hours per day of interdisciplinary therapy in a comprehensive inpatient rehab setting. Physiatrist is providing close team supervision and 24 hour management of active medical problems listed below. Physiatrist and rehab team continue to assess barriers to discharge/monitor patient progress toward functional and medical goals  Care Tool:  Bathing    Body parts bathed by patient: Right arm, Left arm, Chest, Abdomen, Front perineal area, Buttocks, Right upper leg, Left upper leg, Right lower leg, Left lower leg, Face          Bathing assist Assist Level: Set up assist     Upper Body Dressing/Undressing Upper body dressing   What is the patient wearing?:  (personal nightgown)    Upper body assist Assist Level: Independent    Lower Body Dressing/Undressing Lower body dressing      What is the patient wearing?: Underwear/pull up     Lower body assist Assist for lower body dressing: Contact Guard/Touching assist     Toileting Toileting    Toileting assist Assist for toileting: Supervision/Verbal cueing     Transfers Chair/bed transfer  Transfers assist     Chair/bed transfer assist level: Contact Guard/Touching assist Chair/bed transfer assistive device: Armrests, Programmer, multimedia   Ambulation assist      Assist level: Contact Guard/Touching assist Assistive device: No Device Max distance: 187ft   Walk 10 feet activity   Assist     Assist level: Contact Guard/Touching assist Assistive device: No Device   Walk 50 feet activity   Assist    Assist level: Contact Guard/Touching assist Assistive device: No Device    Walk 150 feet activity   Assist    Assist level: Contact Guard/Touching assist Assistive device: No Device  Walk 10 feet on uneven surface  activity   Assist Walk 10 feet on uneven surfaces activity did not occur: Safety/medical concerns         Wheelchair     Assist Is the patient using a wheelchair?: No             Wheelchair 50 feet with 2 turns activity    Assist            Wheelchair 150 feet activity     Assist          Blood pressure (!) 155/89, pulse 74, temperature 98.1 F (36.7 C), resp. rate 16, height 5\' 5"  (1.651 m), weight 78.2 kg, SpO2 98 %.  Medical Problem List and Plan: 1.  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  Plan 30-day cardiac  event monitor Continue CIR PT, OT, SLP , team conf in am 2.  Antithrombotics: -DVT/anticoagulation: amb >150' no need for  Lovenox             -antiplatelet therapy: Aspirin 81 mg daily and Brilinta 90 mg twice daily x30 days then Plavix alone 3. Pain Management: Tylenol as needed 4. Mood: Provide emotional support             -antipsychotic agents: N/A 5. Neuropsych: This patient is capable of making decisions on her own behalf. 6. Skin/Wound Care: Routine skin checks 7. Fluids/Electrolytes/Nutrition: Routine in and outs  8.  Hyperlipidemia.  Crestor 9.  Glaucoma.  Continue eyedrops 10.  History of breast cancer status post bilateral mastectomy.  Follow-up outpatient 11.  Hypertension.  Chlorthalidone discontinued due to bouts of hyponatremia.  Patient on valsartan 325 mg daily as well as chlorthalidone 25mg  daily prior to admission.  Resume as needed Vitals:   07/02/21 1929 07/03/21 0410  BP: (!) 172/78 (!) 155/89  Pulse: 94 74  Resp: 16 16  Temp: 98.4 F (36.9 C) 98.1 F (36.7 C)  SpO2: 98% 98%   Cozaar 25 daily started on 12/5, monitor prior to dose escalation   12. Hyponatremia  Sodium 131 on 12/2, labs ordered for tomorrow  LOS: 5 days A FACE TO FACE EVALUATION WAS PERFORMED  Charlett Blake 07/03/2021, 9:12 AM

## 2021-07-03 NOTE — Progress Notes (Signed)
Occupational Therapy Session Note  Patient Details  Name: Sandra Becker MRN: [id number] Date of Birth: 01/13/1941  Today's Date: 07/03/2021 OT Individual Time: 9767-3419 OT Individual Time Calculation (min): 58 min    Short Term Goals: Week 1:  OT Short Term Goal 1 (Week 1): Pt will complete LB selfcare with supervision sit to stand. OT Short Term Goal 2 (Week 1): Pt will complete LB dressing with supervision sit to stand. OT Short Term Goal 3 (Week 1): Pt will complete toilet transfers with supervision using the RW for support. OT Short Term Goal 4 (Week 1): Pt will complete walk-in shower transfers with use of the RW and close supervision.  Skilled Therapeutic Interventions/Progress Updates:    Session 1: (3790-2409)  Pt with no report of pain during session.  She was agreeable to complete shower and dressing during this session.  She needed min assist for sit to stand from the recliner.  She was able to complete functional mobility in the room to the bathroom and for gathering clothing in the room with min guard assist.  She completed toilet transfer to the regular toilet with min guard assist and then completed removal of clothing sit to stand with min assist for transfer over to the shower.  She was able to then complete bathing with min guard assist using the grab bars for support.  She completed min guard for transfer back out to the wheelchair at the sink for dressing.  She completed donning pullover shirt with supervision and then donned all LB clothing with min guard assist.  Next, she completed functional mobility to the therapy gym with min guard and no device.  Had her complete FM coordination task with use of the [place] Peg Test.  She completed this with the RUE at 48 and then 43 seconds compared to 23 with the LUE.  Next, she was able to complete grip strength test again noted from a few days ago at 22 lbs on the left and 12 lbs on the right.  Finished session with completion  of functional mobility back to the room where she was left in the recliner with the call button and phone in reach and safety alarm in place.    Session 2: ([id number])  Pt in recliner to start session with mod instructional cueing needed to scoot to the [place] and position feet under her for sit to stand.  Min guard was then needed to complete sit to stand functional mobility down to the dayroom with min guard assist and no device.  Had her complete Nustep for 2 sets of 5 mins with resistance on level 5.  The first set she was able to complete with average steps at around 30-35.  She then completed the second set with an average number of steps above 40.  Oxygen sats were 98% on room air with HR in the low 80's between sets.  She then completed transfer over to a chair at the high/low table where she worked on [employer] coordination and cognitive task of completing pill box activity.  She needed 14.5 mins to complete task integrating the RUE for picking up and placing the small beads for each day.  Slight difficulty noted manipulating them with the right hand with pt demonstrating several drops onto the table or floor.  Had her work on picking them up one at a time from the table until 4-5 were in the right hand, and then work on translating them from palm to  fingertips to place back in there appropriate container.  Next had her transfer over to the [facility] with min assist where she was able to work on sit to stand transitions without UE use at min assist level.  Decreased forward trunk flexion noted with decreased timing of hip extension when attempting to stand.  Incorporated reaching down to the floor to pick up a cone to increase trunk and knee flexion as well as reaching for bean bags with the RUE to toss to [employer].  She demonstrated moderate difficulty coordinating toss to the [facility] with the RUE.  She was able to ambulate with min assist and pick up the bean bags after tossing them at min assist level.   Finished session with ambulation back to the room without a device at min guard assist.  She completed toilet transfer to the toilet with successful urination and bowel movement.  Min guard for hygiene and clothing management as well as for transfer out to the sink for washing her hands.  Finished session with return to the recliner with NT in to check for vitals.  Call button and phone in reach with safety alarm belt in place.    Therapy Documentation Precautions:  Precautions Precautions: Fall Precaution Comments: slight right inattention, right hemi paresis, impulsive Restrictions Weight Bearing Restrictions: No  Pain: Pain Assessment Pain Scale: Faces Pain Score: 0-No pain ADL: See Care Tool Section for some details of mobility and selfcare   Therapy/Group: Individual Therapy  Analisia Kingsford OTR/L 07/03/2021, 12:37 PM

## 2021-07-03 NOTE — TOC Benefit Eligibility Note (Signed)
Patient Teacher, English as a foreign language completed.    The patient is currently admitted and upon discharge could be taking clopidogrel (Plavix) 75 mg .  The current 30 day co-pay is, $2.19.   The patient is insured through [place] part D     Lyndel Safe, [place] Patient Advocate Specialist [facility] Patient Advocate Team Direct Number: [phone]  Fax: [phone]

## 2021-07-03 NOTE — Progress Notes (Signed)
Speech Language Pathology Daily Session Note  Patient Details  Name: Sandra Becker MRN: [id number] Date of Birth: Oct 16, 1940  Today's Date: 07/03/2021 SLP Individual Time: 1445-1530 SLP Individual Time Calculation (min): 45 min  Short Term Goals: Week 1: SLP Short Term Goal 1 (Week 1): STG=LTG due to ELOS  Skilled Therapeutic Interventions: Skilled ST treatment focused on communication goals. SLP facilitated session by providing overall min A verbal cues for verbal expression using semantic feature analysis (SFA) emphasizing category, function, action, location, association, properties. Patient with intermittent halting although able to overcome with approximately 75% effectiveness by gestures and/or responding to therapist facilitated concrete yes/no questions. Despite expressing all needs have been met, patient exhibited impulsivity as therapist began to exit room at end of session as evidenced by abruptly removing safety belt which activated chair alarm and attempted to stand up to get into bed. Pt appeared surprised to hear therapist's explanation for needing assistance for safety and reinforced using call bell to request assistance. Pt safety transferred to bed with alarm activated and immediate needs within reach. Continue per current plan of care.      Pain Pain Assessment Pain Scale: 0-10 Pain Score: 0-No pain  Therapy/Group: Individual Therapy  Patty Sermons 07/03/2021, 3:36 PM

## 2021-07-04 MED ORDER — LOSARTAN POTASSIUM 50 MG PO TABS
50.0000 mg | ORAL_TABLET | Freq: Every day | ORAL | Status: DC
  Administered 2021-07-05 – 2021-07-07 (×3): 50 mg via ORAL
  Filled 2021-07-04 (×3): qty 1

## 2021-07-04 NOTE — Progress Notes (Signed)
Occupational Therapy Session Note  Patient Details  Name: Sandra Becker MRN: [id number] Date of Birth: 17-Aug-1940  Today's Date: 07/04/2021 OT Individual Time: 1105-1201 OT Individual Time Calculation (min): 56 min    Short Term Goals: Week 1:  OT Short Term Goal 1 (Week 1): Pt will complete LB selfcare with supervision sit to stand. OT Short Term Goal 2 (Week 1): Pt will complete LB dressing with supervision sit to stand. OT Short Term Goal 3 (Week 1): Pt will complete toilet transfers with supervision using the RW for support. OT Short Term Goal 4 (Week 1): Pt will complete walk-in shower transfers with use of the RW and close supervision.  Skilled Therapeutic Interventions/Progress Updates:    Pt in recliner to start with completion of toileting tasks requested.  Min instructional cueing for scooting out to the [place] prior to trying to stand.  She completed sit to stand with min guard as well as toilet transfer and toileting tasks.  She then washed her hands at the sink and ambulated down to the therapy gym at min guard and no assistive device.  Had her work in sitting with completion of PVC puzzle integrating RUE functional reach and use as well as sequencing and problem solving.  Increased time with mod questioning cueing needed to complete, including recognizing and solving errors after initial completion.  Next, she was able to stand with min guard to take the puzzle apart and then reach down to place the pieces back in the container, placed on the floor to the right of her.  Ambulated back to the room with pt having to identify any item that was yellow that she could point out in the hallway.  She was able to identify approximately 75% of items, missing some on both the right and left.  Once in the room she ambulated at min guard to gather and place items within reach before sitting in her recliner.  Call button and phone in reach with safety alarm in place.    Therapy  Documentation Precautions:  Precautions Precautions: Fall Precaution Comments: slight right inattention, right hemi paresis, impulsive Restrictions Weight Bearing Restrictions: No  Pain: Pain Assessment Pain Scale: Faces Pain Score: 0-No pain ADL: See Care Tool Section for some details of mobility and selfcare   Therapy/Group: Individual Therapy  Biruk Troia OTR/L 07/04/2021, 12:28 PM

## 2021-07-04 NOTE — Patient Care Conference (Signed)
Inpatient RehabilitationTeam Conference and Plan of Care Update Date: 07/04/2021   Time: 11:00 AM    Patient Name: Sandra Becker      Medical Record Number: [id number]  Date of Birth: [birth year] Sex: Female         Room/Bed: 4W24C/4W24C-01 Payor Info: Payor: MEDICARE / Plan: MEDICARE PART A AND B / Product Type: *No Product type* /    Admit Date/Time:  06/28/2021  3:08 PM  Primary Diagnosis:  Infarction of left basal ganglia [facility])  Hospital Problems: Principal Problem:   Infarction of left basal ganglia ([place]) Active Problems:   Benign essential HTN   Dyslipidemia    Expected Discharge Date: Expected Discharge Date: 07/07/21  Team Members Present: Physician leading conference: Dr. Alysia Penna Social Worker Present: Erlene Quan, BSW Nurse Present: Dorien Chihuahua, RN PT Present: Page Spiro, PT OT Present: Clyda Greener, OT SLP Present: Sherren Kerns, SLP PPS Coordinator present : Ileana Ladd, PT     Current Status/Progress Goal Weekly Team Focus  Bowel/Bladder   Continent of B/B. LBM 12/5  Remain continent  Toilet PRN   Swallow/Nutrition/ Hydration             ADL's   Pt is currently setup assist for UB selfcare with min assist to min guard for LB selfcare depending on sit to stand surface.  Transfers are min to min guard as well without use of an assistive device.  Some slight motor planning and sequencing deficits noted at times but not severe.  Flat affect with slight RUE coordination deificits noted as well.  supervision level overall  selfcare retraining, transfer training, DME education, neuromuscular re-education, therapeutic activities, pt education   Mobility   supervision bed mobility, CGA/min assist sit<>stand and stand pivot transfers without AD, CGA/min assist gait up to 170f without AD, min assist 12 stairs using BHRs - mild R inattention  supervision overall at ambulatory level  activity tolerance, dynamic standing balance, dynamic gait  training, stair navigation training, pt education, dual-cognitive-task training during functional mobility, safety awareness education, R attention   Communication   min A  sup A  word finding strategies   Safety/Cognition/ Behavioral Observations  min A - pt can be impulsive with reduced awareness  sup A  safety awareness, problem solving   Pain   No c/o pain  Pain <3/10  Assess Qshift and prn   Skin   Skin intact  Maintain skin integrity  Assess QShift and PRN     Discharge Planning:  D/c home with spouse/ 24/7 supervision   Team Discussion: Patient limited by fatigue, loss of balance more when tired. Right inattention, impulsivity, work finding difficulties noted but patient covers well for her deficits. Need for adaptive equipment questioned.   Patient on target to meet rehab goals: yes, currently needs min assist for communication with goals set for supervision. Needs supervision for upper body care and CGA for lower body care. Overall goals for discharge set for supervision level.  *See Care Plan and progress notes for long and short-term goals.   Revisions to Treatment Plan:  SLP signed off; patient has met goals   Teaching Needs: Safety, medications, secondary risk management, transfers, toileting, etc.  Current Barriers to Discharge: Decreased caregiver support  Possible Resolutions to Barriers: Family education with spouse Follow up OP services     Medical Summary               I attest that I was present, lead the team conference, and  concur with the assessment and plan of the team.   Margarito Liner 07/04/2021, 3:29 PM

## 2021-07-04 NOTE — Progress Notes (Signed)
PROGRESS NOTE   Subjective/Complaints:  No issues overnite, pt seen in PT.  Main issues are right neglect, scanning  ROS: Denies CP, SOB, N/V/D  Objective:   No results found. No results for input(s): WBC, HGB, HCT, PLT in the last 72 hours.  Recent Labs    07/02/21 0528 07/03/21 0512  NA 132* 132*  K 5.3* 4.3  CL 98 100  CO2 29 23  GLUCOSE 113* 102*  BUN 12 10  CREATININE 0.61 0.53  CALCIUM 9.1 8.9     Intake/Output Summary (Last 24 hours) at 07/04/2021 0846 Last data filed at 07/04/2021 0735 Gross per 24 hour  Intake 840 ml  Output --  Net 840 ml         Physical Exam: Vital Signs Blood pressure (!) 160/74, pulse 82, temperature 97.8 F (36.6 C), temperature source Oral, resp. rate 18, height _0  (1.651 m), weight 78.2 kg, SpO2 98 %.    Neuro: Alert Mild/Mod Expressive deficits Motor: Grossly 5/5 in BUE an BLE Amb without assistive device, no limb ataxia, unable to perform tandem gait No ataxia b/l UE  Assessment/Plan: 1. Functional deficits which require 3+ hours per day of interdisciplinary therapy in a comprehensive inpatient rehab setting. Physiatrist is providing close team supervision and 24 hour management of active medical problems listed below. Physiatrist and rehab team continue to assess barriers to discharge/monitor patient progress toward functional and medical goals  Care Tool:  Bathing    Body parts bathed by patient: Right arm, Left arm, Chest, Abdomen, Front perineal area, Buttocks, Right upper leg, Left upper leg, Right lower leg, Left lower leg, Face         Bathing assist Assist Level: Contact Guard/Touching assist     Upper Body Dressing/Undressing Upper body dressing   What is the patient wearing?:  (personal nightgown)    Upper body assist Assist Level: Set up assist    Lower Body Dressing/Undressing Lower body dressing      What is the patient wearing?:  Underwear/pull up     Lower body assist Assist for lower body dressing: Contact Guard/Touching assist     Toileting Toileting    Toileting assist Assist for toileting: Minimal Assistance - Patient > 75%     Transfers Chair/bed transfer  Transfers assist     Chair/bed transfer assist level: Minimal Assistance - Patient > 75% Chair/bed transfer assistive device: Armrests, Programmer, multimedia   Ambulation assist      Assist level: Contact Guard/Touching assist Assistive device: No Device Max distance: 174f   Walk 10 feet activity   Assist     Assist level: Contact Guard/Touching assist Assistive device: No Device   Walk 50 feet activity   Assist    Assist level: Contact Guard/Touching assist Assistive device: No Device    Walk 150 feet activity   Assist    Assist level: Contact Guard/Touching assist Assistive device: No Device    Walk 10 feet on uneven surface  activity   Assist Walk 10 feet on uneven surfaces activity did not occur: Safety/medical concerns         Wheelchair     Assist Is  the patient using a wheelchair?: No             Wheelchair 50 feet with 2 turns activity    Assist            Wheelchair 150 feet activity     Assist          Blood pressure (!) 160/74, pulse 82, temperature 97.8 F (36.6 C), temperature source Oral, resp. rate 18, height _0  (1.651 m), weight 78.2 kg, SpO2 98 %.  Medical Problem List and Plan: 1.  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  Plan 30-day cardiac event monitor Continue CIR PT, OT, SLP ,  Team conference today please see physician documentation under team conference tab, met with team  to discuss problems,progress, and goals. Formulized individual treatment plan based on medical history, underlying problem and  comorbidities.  2.  Antithrombotics: -DVT/anticoagulation: amb >150' no need for  Lovenox             -antiplatelet therapy: Aspirin 81 mg daily and Brilinta 90 mg twice daily x30 days then Plavix alone 3. Pain Management: Tylenol as needed 4. Mood: Provide emotional support             -antipsychotic agents: N/A 5. Neuropsych: This patient is capable of making decisions on her own behalf. 6. Skin/Wound Care: Routine skin checks 7. Fluids/Electrolytes/Nutrition: Routine in and outs  8.  Hyperlipidemia.  Crestor 9.  Glaucoma.  Continue eyedrops 10.  History of breast cancer status post bilateral mastectomy.  Follow-up outpatient 11.  Hypertension.  Chlorthalidone discontinued due to bouts of hyponatremia.  Patient on valsartan 320 mg daily as well as chlorthalidone 25m daily prior to admission.  Resume as needed Vitals:   07/03/21 2015 07/04/21 0413  BP: (!) 174/68 (!) 160/74  Pulse: 80 82  Resp: 14 18  Temp: 98.1 F (36.7 C) 97.8 F (36.6 C)  SpO2: 99% 98%   Cozaar 25 daily started on 12/5, will increase dose to 520m(is on high dose valsartan at home)  12. Hyponatremia  Sodium 131 on 12/2, 132 on 12/6 stable  LOS: 6 days A FACE TO FACE EVALUATION WAS PERFORMED  AnCharlett Blake2/01/2021, 8:46 AM

## 2021-07-04 NOTE — Progress Notes (Signed)
Speech Language Pathology Daily Session Note  Patient Details  Name: Sandra Becker MRN: [id number] Date of Birth: 01-02-1941  Today's Date: 07/04/2021 SLP Individual Time: 0900-0930 SLP Individual Time Calculation (min): 30 min  Short Term Goals: Week 1: SLP Short Term Goal 1 (Week 1): STG=LTG due to ELOS  Skilled Therapeutic Interventions: Skilled ST treatment focused on communication and cognitive goals. SLP facilitated session by providing sup-to-min A verbal cues for generative naming task (generate 5 words within common categories and then words within category based on initial letter). Patient exhibited circumlocution and mild word finding difficulty in which she was able to overcome using description strategy with sup A verbal cues and extended processing time. Pt benefited from sup A verbal cues for error awareness. Patient was left in wheelchair  with alarm activated and immediate needs within reach at end of session. Continue per current plan of care.       Pain Pain Assessment Pain Scale: 0-10 Pain Score: 0-No pain  Therapy/Group: Individual Therapy  Patty Sermons 07/04/2021, 9:29 AM

## 2021-07-04 NOTE — Progress Notes (Signed)
Physical Therapy Session Note  Patient Details  Name: Sandra Becker MRN: [id number] Date of Birth: 1941-06-18  Today's Date: 07/04/2021 PT Individual Time: 0805-0901 and 1305-1407 PT Individual Time Calculation (min): 56 min and 62 min   Short Term Goals: Week 1:  PT Short Term Goal 1 (Week 1): STG=LTG based on ELOS  Skilled Therapeutic Interventions/Progress Updates:    Session 1: Pt received sitting in recliner and agreeable to therapy session. Sit>stand from recliner, no AD, with CGA - pt relies on pushing up with B UEs on armrest to rise into standing.  Upon turning to grab mask, pt sees her food menu and realizes she needs to order lunch and dinner for today and breakfast for tomorrow - therapist assisted her in locating the phone number on the menu and then provided step-by-step cuing to dial the phone number into the phone (pt frequently not pushing the buttons hard enough to register on the phone). Pt able to correctly order her meal with min cuing.  Gait in/out bathroom, no AD, with CGA for safety but no LOB noted. Sit<>stand to/from toilet using B UE support on grab bars for improved eccentric control and assist to rise into standing. Continent of bladder and performed seated peri-care without assist. Hand hygiene at sink with close supervision.   Gait training ~118ft to main therapy gym, no AD, with CGA for safety - continues to have wide based gait with increased R/L postural sway - requires cuing to recall turning R towards gym as she initially turns L.  MD in/out for morning assessment. Dynamic gait training to locate numbered disks in hallway starting with odds then evens - no AD with CGA for safety but only starting to have 2x slight anterior LOBs  towards end when fatigued - continues to have R inattention with pt frequently not scanning to R wall to locate items - R UE NMR to collect all items in that hand including 1x picking up object from floor with CGA.  Dynamic gait  training using agility ladder including: - forward reciprocal stepping - forward 2 feet in, 2 feet out going side to side targeting motor planning and sequencing - requires cuing for sequencing - consistent min assist for balance due to R LOB when laterally stepping to R - side stepping lighter min A - more difficulty taking large enough lateral steps with RLE in both directions Min assist for balance throughout  Gait training ~135ft back to room, no AD, with CGA/close supervision and pt starting to demos slight anterior lean with fatigue but no overt LOB. Pt left seated in recliner with needs in reach and seat belt alarm on.   Session 2: Pt received sitting in recliner and agreeable to therapy session. Discussed trialing RW this afternoon due to slight anterior LOB this AM with fatigue towards end of session and pt in agreement. Sit>stand from recliner to RW with pt continuing to rely heavily on pushing up with her hands on armrests to rise into standing with delayed anterior weight shift pushing backs of legs against chair.   Gait training ~186ft to main therapy gym using RW with CGA/close supervision - pt demos intermittently kicking AD due to not placing it correctly when turning as well as poor AD management in room when trying to navigate around furniture - this all could possibly place pt at increased fall risk compared to without AD.  Initiated floor transfer training requiring heavy mod assist moving from seated on mat to half kneeling  on gym mat on the floor as pt lacks hip strength to control the movement into half-kneeling with poor eccentric control. Pt reports tweaking R knee during this transition therefore just assisted pt back up onto the therapy mat from sitting on floor to tall kneeling to half kneeling to sitting on mat with heavy mod assist using B UE support on mat and pt demoing some impaired motor planning despite therapist having demonstrated technique. Educated pt that in the  event she has a fall at home she needs to call 911 rather than attempt to get up off of the floor with family assistance - pt in agreement.  Gait training ~149ft using SPC in R hand; however, due to R inattention pt unable to be consistent with bringing AD forward and instead tends to leave it behind making it no more safe than without AD. Pt reports her R knee feels better and is able to continue with session without limitations.  Targeting R LE strengthening performed repeated step up/down on/off 1st 6" step with R HR support and light min assist 2x10reps - attempted with L LE; however, pt reports too painful for L hip stating she likely needs surgical intervention on that side at some point due to chronic pain.   Supine<>sit on mat with supervision - pt uses UEs to assist with L LE management on/off mat due to hip pain and weak hip flexors.  Attempted bridging x3 reps but pt reports too painful in L hip therefore discontinued this exercise.  Targeting B LE strengthening via repeated sit<>stands to/from partially elevated EOM 2x10 reps, no UE support, with light min assist for balance due to minor posterior lean - educated and trained pt on hip balance recovery strategy to prevent posterior LOB - pt demoing improving anterior weight shift with decrease pushing of backs of legs against mat.   Gait back to room, no AD, with CGA/close supervision - continues to demo slightly wider BOS with slight forward trunk flexion at the hips and slight increased R/L postural sway.  Gait in/out bathroom with supervision. Standing with supervision performed LB clothing management without assist. Sit<>stand to/from toilet relying on grab bars for eccentric control and pulling back up into standing. Continent of bladder and performed seated peri-care without assist. At end of session, pt left supine in bed with needs in reach and bed alarm on.  Therapy Documentation Precautions:  Precautions Precautions:  Fall Precaution Comments: slight right inattention, right hemi paresis, impulsive Restrictions Weight Bearing Restrictions: No   Pain:  Session 1: States "it's just hurting" referring to her back and then states L hip is sore after agility ladder drill - provided seated rest breaks for pain management throughout session.  Session 2: Reports R knee pain from floor transfer - provided seated rest break then ambulation for gentle movement and WBing with pt reporting improvement in her symptoms and able to continue with session without limitation. Reports hx of L hip pain prior to hospitalization that was exacerbated by exercising on Nustep therefore unable to perform step-ups at stairs leading with L LE.    Therapy/Group: Individual Therapy  Tawana Scale , PT, DPT, NCS, CSRS  07/04/2021, 7:41 AM

## 2021-07-05 MED ORDER — ACETAMINOPHEN 325 MG PO TABS: 650.0000 mg | ORAL_TABLET | ORAL | Status: DC | PRN

## 2021-07-05 MED ORDER — LOSARTAN POTASSIUM 50 MG PO TABS: 50.0000 mg | ORAL_TABLET | Freq: Every day | ORAL | 0 refills | Status: DC

## 2021-07-05 MED ORDER — ASPIRIN 81 MG PO TBEC
81.0000 mg | DELAYED_RELEASE_TABLET | Freq: Every day | ORAL | 11 refills | Status: AC
Start: 2021-07-05 — End: 2021-07-25

## 2021-07-05 MED ORDER — VALSARTAN 320 MG PO TABS: 320.0000 mg | ORAL_TABLET | Freq: Every day | ORAL | 0 refills | Status: AC

## 2021-07-05 MED ORDER — CLOPIDOGREL BISULFATE 75 MG PO TABS: ORAL_TABLET | ORAL | 1 refills | Status: AC

## 2021-07-05 MED ORDER — TICAGRELOR 90 MG PO TABS
90.0000 mg | ORAL_TABLET | Freq: Two times a day (BID) | ORAL | 0 refills | Status: AC
Start: 2021-07-05 — End: 2021-07-25

## 2021-07-05 MED ORDER — ROSUVASTATIN CALCIUM 10 MG PO TABS: 10.0000 mg | ORAL_TABLET | Freq: Every day | ORAL | 0 refills | Status: AC

## 2021-07-05 NOTE — Progress Notes (Signed)
Speech Language Pathology Daily Session Note  Patient Details  Name: Sandra Becker MRN: [id number] Date of Birth: Sep 13, 1940  Today's Date: 07/05/2021 SLP Individual Time: 1115-1200 SLP Individual Time Calculation (min): 45 min  Short Term Goals: Week 1: SLP Short Term Goal 1 (Week 1): STG=LTG due to ELOS  Skilled Therapeutic Interventions: Skilled ST treatment focused on cognitive goals. Pt ambulated to therapy gym using RW with sup A verbal cues for safety including avoiding slippery floor, reaching back for arm rest to sitting in chair, and adjusting walker to avoid obstacles on right hand side. SLP facilitated use of BITS with emphasis on selective attention and working memory. Patient completed sequential memory task by recalling up to 7 words in sequential order with 87% accuracy and sup A verbal cues. This task was completed in a quiet environment at the time. Pt then completed randomized memory task by recalling up to 6 random words to achieve 66% accuracy and min-to-mod A verbal cues and increased repetition. It appeared breakdown occurred with second task due to randomized nature as well as external distractions (people taking in background) in which patient identified as a challenge. Educated patient on attention and memory strategies to maximize safety and independence in which patient verbally taught back at end of session. Pt ambulated back to room using RW with CGA. Alarm was activated and immediate needs within reach at end of session. Continue per current plan of care.      Pain Pain Assessment Pain Scale: 0-10 Pain Score: 3  Pain Location: Hip Pain Orientation: Left Pain Intervention(s): Medication (See eMAR);RN made aware  Therapy/Group: Individual Therapy  Patty Sermons 07/05/2021, 11:23 AM

## 2021-07-05 NOTE — Progress Notes (Signed)
Physical Therapy Session Note  Patient Details  Name: Sandra Becker MRN: [id number] Date of Birth: 1941-03-13  Today's Date: 07/05/2021 PT Individual Time: 1010-1119 PT Individual Time Calculation (min): 69 min   Short Term Goals: Week 1:  PT Short Term Goal 1 (Week 1): STG=LTG based on ELOS  Skilled Therapeutic Interventions/Progress Updates:    Pt received sitting in recliner and agreeable to therapy session. Sit>stand from recliner with pt continuing to rely on pushing up with UEs to rise into standing with CGA for safety. Gait ~12ft to main therapy gym, no AD, with CGA/close supervision for safety - no LOB or significant instability noted - continues to have wider BOS with increased R/L postural sway. Patient participated in [facility] and demonstrates increased fall risk as noted by score of  43/56; however, significantly improved compared to 28/56 at initial evaluation.  (<36= high risk for falls, close to 100%; 37-45 significant >80%; 46-51 moderate >50%; 52-55 lower >25%). Pt participated in Functional Gait Assessment (FGA) with score of 11/30 demonstrating high fall risk (low fall risk 25-28, medium fall risk 19-24, and high fall risk <19). Educated pt on results of balance assessments and fall risk.  Noticed that during both assessments pt tends to have minor LOB when turning head R (during horizontal head turns on FGA and during turning 360 to R on Berg).  During seated rest breaks, noticed when pt going to sit in a standard chair she starts sitting prior to turning fully - reinforced education on importance of feeling chair on the back of both legs prior to initiating sitting.  Participated in the following dynamic balance tasks:  - alternate B LE foot taps on 4" step with min assist progressed to CGA - pt often having difficulty bringing R foot back off of step fully causing R posterior LOB (appears to have some impaired awareness of where her foot is in space vs impaired  motor planning on how far back she needs to bring her foot) - progressed to 3 cone tap on verbal command targeting auditory processing and motor planning - continues to require min assist when tapping R foot to target due to R LOB from lack of full and sustained L weight shift onto L stance limb (repeatedly pt denies L hip pain with this task)  Dynamic gait of side stepping with dual-task of bouncing large ball with another person - requires visual demonstration of 2nd person to learn the sequence - light min assist for balance.  Gait ~133ft back to room with close supervision for safety and no instances of instability - continues to have above gait deviations.  Pt left seated in recliner with needs in reach and seat belt alarm on.   Therapy Documentation Precautions:  Precautions Precautions: Fall Precaution Comments: slight right inattention, right hemi paresis, impulsive Restrictions Weight Bearing Restrictions: No   Pain:   During FGA assessment pt suddenly leans over onto handrail in hallway and reports increased pain in L hip... again pt stating it was irritated from doing the Nustep a couple days ago during therapy - states at rest the pain is minimal to none but is provoked by activity (she is unable to identify if it is a specific activity that irritates it, but appears to be anything) - provided seated rest breaks with improvement in symptoms throughout session.    Balance:  Standardized Balance Assessment Standardized Balance Assessment: Merrilee Jansky Balance Test;Functional Gait Assessment Berg Balance Test Sit to Stand: Able to stand  independently using hands Standing Unsupported: Able to stand safely 2 minutes Sitting with Back Unsupported but Feet Supported on Floor or Stool: Able to sit safely and securely 2 minutes Stand to Sit: Controls descent by using hands Transfers: Able to transfer safely, minor use of hands Standing Unsupported with Eyes Closed: Able to stand 10  seconds safely Standing Ubsupported with Feet Together: Able to place feet together independently and stand 1 minute safely From Standing, Reach Forward with Outstretched Arm: Can reach forward >12 cm safely (5") From Standing Position, Pick up Object from Floor: Able to pick up shoe safely and easily From Standing Position, Turn to Look Behind Over each Shoulder: Looks behind from both sides and weight shifts well Turn 360 Degrees: Able to turn 360 degrees safely but slowly Standing Unsupported, Alternately Place Feet on Step/Stool: Able to complete >2 steps/needs minimal assist Standing Unsupported, One Foot in Front: Able to take small step independently and hold 30 seconds Standing on One Leg: Tries to lift leg/unable to hold 3 seconds but remains standing independently Total Score: 43 Functional Gait  Assessment Gait assessed : Yes Gait Level Surface: Walks 20 ft in less than 7 sec but greater than 5.5 sec, uses assistive device, slower speed, mild gait deviations, or deviates 6-10 in outside of the 12 in walkway width. Change in Gait Speed: Makes only minor adjustments to walking speed, or accomplishes a change in speed with significant gait deviations, deviates 10-15 in outside the 12 in walkway width, or changes speed but loses balance but is able to recover and continue walking. Gait with Horizontal Head Turns: Performs head turns with moderate changes in gait velocity, slows down, deviates 10-15 in outside 12 in walkway width but recovers, can continue to walk. Gait with Vertical Head Turns: Performs task with slight change in gait velocity (eg, minor disruption to smooth gait path), deviates 6 - 10 in outside 12 in walkway width or uses assistive device Gait and Pivot Turn: Pivot turns safely in greater than 3 sec and stops with no loss of balance, or pivot turns safely within 3 sec and stops with mild imbalance, requires small steps to catch balance. Step Over Obstacle: Cannot perform  without assistance. Gait with Narrow Base of Support: Ambulates less than 4 steps heel to toe or cannot perform without assistance. Gait with Eyes Closed: Cannot walk 20 ft without assistance, severe gait deviations or imbalance, deviates greater than 15 in outside 12 in walkway width or will not attempt task. Ambulating Backwards: Walks 20 ft, slow speed, abnormal gait pattern, evidence for imbalance, deviates 10-15 in outside 12 in walkway width. Steps: Alternating feet, must use rail. Total Score: 11     Therapy/Group: Individual Therapy  Tawana Scale , PT, DPT, NCS, CSRS  07/05/2021, 7:54 AM

## 2021-07-05 NOTE — Discharge Summary (Signed)
Physician Discharge Summary  Patient ID: Sandra Becker MRN: [id number] DOB/AGE: 02-17-1941 80 y.o.  Admit date: 06/28/2021 Discharge date: 07/07/2021  Discharge Diagnoses:  Principal Problem:   Infarction of left basal ganglia (HCC) Active Problems:   Benign essential HTN   Dyslipidemia DVT prophylaxis Glaucoma History of breast cancer with mastectomy  Discharged Condition: Stable  Significant Diagnostic Studies: MR ANGIO HEAD WO CONTRAST  Result Date: 06/25/2021 CLINICAL DATA:  Confusion, right facial droop, right-sided weakness, slurred speech, stroke suspected EXAM: MRI HEAD WITHOUT CONTRAST MRA HEAD WITHOUT CONTRAST TECHNIQUE: Multiplanar, multi-echo pulse sequences of the brain and surrounding structures were acquired without intravenous contrast. Angiographic images of the Circle of Willis were acquired using MRA technique without intravenous contrast. COMPARISON:  05/03/2021 MRI head, correlation is also made with 05/03/2021 CTA head FINDINGS: MRI HEAD FINDINGS Brain: Restricted diffusion with ADC correlate in the left basal ganglia (series 2, images 29-32), likely acute infarct. No acute hemorrhage, mass, mass effect, or midline shift. No extra-axial collection or hydrocephalus. Confluent T2 hyperintense signal in the periventricular white matter and pons, likely the sequela of severe chronic small vessel ischemic disease. 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 Vascular: Please see MRA findings below Skull and upper cervical spine: Normal marrow signal. Sinuses/Orbits: No acute or significant finding. Status post bilateral lens replacements. Other: The mastoids are well aerated. MRA HEAD FINDINGS Anterior circulation: Both internal carotid arteries are patent to the termini, with mild narrowing in the right supraclinoid segment but without significant stenosis or other abnormality. A1 segments patent. Normal anterior  communicating artery. Anterior cerebral arteries are patent to their distal aspects. No M1 stenosis or occlusion. Normal MCA bifurcations. Distal MCA branches perfused and symmetric. Posterior circulation: Vertebral arteries patent to the vertebrobasilar junction without stenosis. Basilar patent to its distal aspect. Superior cerebellar arteries patent bilaterally. PCAs perfused to their distal aspects without focal stenosis, although the left proximal P2 is diminutive, likely diffuse narrowing. The bilateral posterior communicating arteries are not visualized. Anatomic variants: None significant IMPRESSION: 1. 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 2. No intracranial large vessel occlusion or severe stenosis. Mild diffuse narrowing in the left P2 segment. These results were called by telephone at the time of interpretation on 06/25/2021 at 2:47 am to provider [facility] , who verbally acknowledged these results. Electronically Signed   By: Merilyn Baba M.D.   On: 06/25/2021 02:48   MR BRAIN WO CONTRAST  Result Date: 06/25/2021 CLINICAL DATA:  Confusion, right facial droop, right-sided weakness, slurred speech, stroke suspected EXAM: MRI HEAD WITHOUT CONTRAST MRA HEAD WITHOUT CONTRAST TECHNIQUE: Multiplanar, multi-echo pulse sequences of the brain and surrounding structures were acquired without intravenous contrast. Angiographic images of the Circle of Willis were acquired using MRA technique without intravenous contrast. COMPARISON:  05/03/2021 MRI head, correlation is also made with 05/03/2021 CTA head FINDINGS: MRI HEAD FINDINGS Brain: Restricted diffusion with ADC correlate in the left basal ganglia (series 2, images 29-32), likely acute infarct. No acute hemorrhage, mass, mass effect, or midline shift. No extra-axial collection or hydrocephalus. Confluent T2 hyperintense signal in the periventricular white matter and pons, likely the sequela of severe chronic small vessel ischemic disease.  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 Vascular: Please see MRA findings below Skull and upper cervical spine: Normal marrow signal. Sinuses/Orbits: No acute or significant finding. Status post bilateral lens replacements. Other: The mastoids are well aerated. MRA HEAD FINDINGS Anterior  circulation: Both internal carotid arteries are patent to the termini, with mild narrowing in the right supraclinoid segment but without significant stenosis or other abnormality. A1 segments patent. Normal anterior communicating artery. Anterior cerebral arteries are patent to their distal aspects. No M1 stenosis or occlusion. Normal MCA bifurcations. Distal MCA branches perfused and symmetric. Posterior circulation: Vertebral arteries patent to the vertebrobasilar junction without stenosis. Basilar patent to its distal aspect. Superior cerebellar arteries patent bilaterally. PCAs perfused to their distal aspects without focal stenosis, although the left proximal P2 is diminutive, likely diffuse narrowing. The bilateral posterior communicating arteries are not visualized. Anatomic variants: None significant IMPRESSION: 1. 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 2. No intracranial large vessel occlusion or severe stenosis. Mild diffuse narrowing in the left P2 segment. These results were called by telephone at the time of interpretation on 06/25/2021 at 2:47 am to provider [facility] , who verbally acknowledged these results. Electronically Signed   By: Merilyn Baba M.D.   On: 06/25/2021 02:48   EEG adult  Result Date: 06/25/2021 Lora Havens, MD     06/25/2021 12:56 PM Patient Name: Sandra Becker MRN: [id number] Epilepsy Attending: Lora Havens Referring Physician/Provider: Dr Donnetta Simpers Date: 06/25/2021 Duration: 23.06 mins Patient history: 80 year old female with right upper extremity weakness and word finding difficulty, similar episode in  October 2022.  EEG evaluate for seizure. Level of alertness: Awake, asleep AEDs during EEG study: None Technical aspects: This EEG study was done with scalp electrodes positioned according to the 10-20 International system of electrode placement. Electrical activity was acquired at a sampling rate of 500Hz  and reviewed with a high frequency filter of 70Hz  and a low frequency filter of 1Hz . EEG data were recorded continuously and digitally stored. Description: The posterior dominant rhythm consists of 8 Hz activity of moderate voltage (25-35 uV) seen predominantly in posterior head regions, symmetric and reactive to eye opening and eye closing. Sleep was characterized by vertex waves, sleep spindles (12 to 14 Hz), maximal frontocentral region. Hyperventilation and photic stimulation were not performed.   IMPRESSION: This study is within normal limits. No seizures or epileptiform discharges were seen throughout the recording. Lora Havens   CT HEAD CODE STROKE WO CONTRAST  Result Date: 06/24/2021 CLINICAL DATA:  Code stroke. Neuro deficit, acute, stroke suspected. Facial droop, slurred speech, right-sided weakness. EXAM: CT HEAD WITHOUT CONTRAST TECHNIQUE: Contiguous axial images were obtained from the base of the skull through the vertex without intravenous contrast. COMPARISON:  Brain MRI 05/03/2021. CT angiogram head/neck 05/03/2021. Head CT 05/03/2021. FINDINGS: Brain: Mild generalized cerebral atrophy. 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 Background moderate/advanced patchy and ill-defined hypoattenuation within the cerebral white matter, nonspecific but compatible chronic small vessel ischemic disease. There is no acute intracranial hemorrhage. No demarcated cortical infarct. No extra-axial fluid collection. No evidence of an intracranial mass. No midline shift. Vascular: No hyperdense vessel.  Atherosclerotic  calcifications. Skull: Normal. Negative for fracture or focal lesion. Sinuses/Orbits: Visualized orbits show no acute finding. Mild mucosal thickening within the bilateral ethmoid air cells. Chronic medially displaced fracture deformity of the right lamina papyracea. ASPECTS ([place] Stroke Program Early CT Score) - Ganglionic level infarction (caudate, lentiform nuclei, internal capsule, insula, M1-M3 cortex): 7 - Supraganglionic infarction (M4-M6 cortex): 3 Total score (0-10 with 10 being normal): 10 These results were called by telephone at the time of interpretation on 06/24/2021 at 6:51 pm to provider Dr. Quinn Axe, who verbally  acknowledged these results. IMPRESSION: No evidence of acute intracranial abnormality. Redemonstrated chronic lacunar infarcts within the bilateral deep gray nuclei and right cerebellar hemisphere. Moderate/advanced chronic small vessel ischemic changes within the cerebral white matter. Mild generalized cerebral atrophy. Electronically Signed   By: Kellie Simmering D.O.   On: 06/24/2021 18:53    Labs:  Basic Metabolic Panel: Recent Labs  Lab 06/29/21 0629 07/02/21 0528 07/03/21 0512  NA 131* 132* 132*  K 3.7 5.3* 4.3  CL 100 98 100  CO2 25 29 23   GLUCOSE 113* 113* 102*  BUN 15 12 10   CREATININE 0.50 0.61 0.53  CALCIUM 8.8* 9.1 8.9    CBC: Recent Labs  Lab 06/29/21 0629  WBC 8.9  NEUTROABS 5.6  HGB 13.2  HCT 39.8  MCV 88.8  PLT 360    CBG: No results for input(s): GLUCAP in the last 168 hours.  Family history.  Positive for hypertension as well as hyperlipidemia.  Denies any colon cancer esophageal cancer or rectal cancer  Brief HPI:   Sandra Becker is a 80 y.o. right-handed female with history of hypertension glaucoma hyperlipidemia breast cancer with mastectomy quit smoking 57 years ago.  Per chart review lives with spouse independent living facility.  She is a retired Chief Executive Officer.  Modified independent prior to admission.  Presented 06/24/2021 with acute  onset of right side weakness and slurred speech.  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  She was placed on DAPT with aspirin and Plavix admission chemistry sodium 126 glucose 138 hemoglobin 14.2.  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  MRA of the head showed no intracranial large vessel occlusion or stenosis.  EEG negative for seizure.  Most recent echocardiogram 05/04/2021 ejection fraction of 60 to 65% no wall motion abnormalities grade 1 diastolic dysfunction.  Neurology follow-up maintained on aspirin 81 mg daily as well as Brilinta 90 mg twice daily x30 days then Plavix alone as well as plan for 30-day cardiac event monitor.  Tolerating a regular diet.  Therapy evaluations completed due to patient's right side weakness and slurred speech was admitted for a comprehensive rehab program.   Hospital Course: Sandra Becker was admitted to rehab 06/28/2021 for inpatient therapies to consist of PT, ST and OT at least three hours five days a week. Past admission physiatrist, therapy team and rehab RN have worked together to provide customized collaborative inpatient rehab.  Pertain to patient's left basal ganglia infarction remained stable follow-up neurology services 30-day cardiac event monitor had been recommended.  Aspirin 81 mg daily and Brilinta 90 mg twice daily x30 days then Plavix alone.  Blood pressure remained controlled and she will continue on valsartan alone she had been on losartan had been discontinued as well as chlorthalidone discontinued due to mild hyponatremia.  Crestor ongoing for hyperlipidemia.  She did have a history of glaucoma maintained on eyedrops as recommended.  Initially on Lovenox for DVT prophylaxis discontinued ambulating greater 150 feet.   Blood pressures were monitored on TID basis and controlled     Rehab course: During patient's stay in rehab  weekly team conferences were held to monitor patient's progress, set goals and discuss barriers to discharge. At admission, patient required minimal guard 125 feet rolling walker minimal assist sit to stand  Physical exam.  Blood pressure 129/66 pulse 71 temperature 97.7 respirations 18 oxygen saturations 98% room air Constitutional.  No  acute distress HEENT Head.  Normocephalic and atraumatic Eyes.  Pupils round and reactive to light no discharge without nystagmus Neck.  Supple nontender no JVD without thyromegaly Cardiac regular rate rhythm any extra sounds or murmur heard Abdomen.  Soft nontender positive bowel sounds without rebound Respiratory effort normal no respiratory distress without wheeze Musculoskeletal.  Normal range of motion Comments.  Left upper extremity left lower extremity 5/5 Right upper extremity 5 -/5 in biceps triceps/WR, grip and FA Right lower extremity 4+/5 in hip flexors knee extension knee flexion dorsi plantarflexion Neurologic.  Alert with mild expressive aphasia however she was able to provide her name place and name objects.  Right inattention noted.  He/She  has had improvement in activity tolerance, balance, postural control as well as ability to compensate for deficits. He/She has had improvement in functional use RUE/LUE  and RLE/LLE as well as improvement in awareness.  Sit to stand from recliner no assistive device contact-guard.  Ambulates in and out of the bathroom straight point cane and contact-guard.  Increase her ambulation up to 150 feet without assistive device contact-guard for safety.  Completed sit to stand minimal guard for ADLs as well as toilet transfers and toileting task.  She would then wash her hands at the sink ambulate to her therapy gym for sessions.  She was able to stand with minimal guard to take a puzzle apart then reach down to place the pieces back in the container placed on the floor to the right of her.  Speech therapy follow-up  patient exhibited circumlocution and mild word finding difficulty in which she was able to overcome using description strategy with set up.  Full family teaching completed plan discharged to home       Disposition: Discharge to home    Diet: Regular  Special Instructions: No driving smoking or alcohol  Medications at discharge 1.  Tylenol as needed 2.  Aspirin 81 mg p.o. daily until 07/25/2021 and stop 3.Azopt ophthalmic solution 1% 1 drop both eyes twice daily 4.  Diovan 320 mg daily 5.  Crestor 10 mg p.o. daily 6.  Brilinta 90 mg p.o. twice daily until 07/25/2021 and stop 7.  Timoptic 0.5% 1 drop both eyes twice daily 8.  Plavix 75 mg daily beginning 07/26/2021   30-35 minutes were spent completing discharge summary and discharge planning  Discharge Instructions     Ambulatory referral to Neurology   Complete by: As directed    An appointment is requested in approximately: 4 weeks left basal ganglia infarction   Ambulatory referral to Physical Medicine Rehab   Complete by: As directed    Moderate complexity follow-up 1 to 2 weeks left basal ganglia infarction        Follow-up Information     Kirsteins, Luanna Salk, MD Follow up.   Specialty: Physical Medicine and Rehabilitation Why: Office to call for appointment Contact information: [place] [address] [phone]                 Signed: Cathlyn Parsons 07/06/2021, 4:55 AM

## 2021-07-05 NOTE — Progress Notes (Signed)
PROGRESS NOTE   Subjective/Complaints:  Discussed d/c date, pt confident about going home   ROS: Denies CP, SOB, N/V/D  Objective:   No results found. No results for input(s): WBC, HGB, HCT, PLT in the last 72 hours.  Recent Labs    07/03/21 0512  NA 132*  K 4.3  CL 100  CO2 23  GLUCOSE 102*  BUN 10  CREATININE 0.53  CALCIUM 8.9     Intake/Output Summary (Last 24 hours) at 07/05/2021 0856 Last data filed at 07/05/2021 0730 Gross per 24 hour  Intake 1020 ml  Output --  Net 1020 ml         Physical Exam: Vital Signs Blood pressure (!) 163/88, pulse 86, temperature 98.2 F (36.8 C), temperature source Oral, resp. rate 14, height 5\' 5"  (1.651 m), weight 78.2 kg, SpO2 98 %.    General: No acute distress Mood and affect are appropriate Heart: Regular rate and rhythm no rubs murmurs or extra sounds Lungs: Clear to auscultation, breathing unlabored, no rales or wheezes Abdomen: Positive bowel sounds, soft nontender to palpation, nondistended Extremities: No clubbing, cyanosis, or edema Skin: No evidence of breakdown, no evidence of rash   Neuro: Alert Mild/Mod Expressive deficits Motor: Grossly 5/5 in BUE an BLE Amb without assistive device, no limb ataxia, unable to perform tandem gait No ataxia b/l UE  Assessment/Plan: 1. Functional deficits which require 3+ hours per day of interdisciplinary therapy in a comprehensive inpatient rehab setting. Physiatrist is providing close team supervision and 24 hour management of active medical problems listed below. Physiatrist and rehab team continue to assess barriers to discharge/monitor patient progress toward functional and medical goals  Care Tool:  Bathing    Body parts bathed by patient: Right arm, Left arm, Chest, Abdomen, Front perineal area, Buttocks, Right upper leg, Left upper leg, Right lower leg, Left lower leg, Face         Bathing assist  Assist Level: Contact Guard/Touching assist     Upper Body Dressing/Undressing Upper body dressing   What is the patient wearing?:  (personal nightgown)    Upper body assist Assist Level: Set up assist    Lower Body Dressing/Undressing Lower body dressing      What is the patient wearing?: Underwear/pull up     Lower body assist Assist for lower body dressing: Contact Guard/Touching assist     Toileting Toileting    Toileting assist Assist for toileting: Minimal Assistance - Patient > 75%     Transfers Chair/bed transfer  Transfers assist     Chair/bed transfer assist level: Contact Guard/Touching assist Chair/bed transfer assistive device: Armrests   Locomotion Ambulation   Ambulation assist      Assist level: Contact Guard/Touching assist Assistive device: No Device Max distance: 121ft   Walk 10 feet activity   Assist     Assist level: Contact Guard/Touching assist Assistive device: No Device   Walk 50 feet activity   Assist    Assist level: Contact Guard/Touching assist Assistive device: No Device    Walk 150 feet activity   Assist    Assist level: Contact Guard/Touching assist Assistive device: No Device    Walk  10 feet on uneven surface  activity   Assist Walk 10 feet on uneven surfaces activity did not occur: Safety/medical concerns         Wheelchair     Assist Is the patient using a wheelchair?: No             Wheelchair 50 feet with 2 turns activity    Assist            Wheelchair 150 feet activity     Assist          Blood pressure (!) 163/88, pulse 86, temperature 98.2 F (36.8 C), temperature source Oral, resp. rate 14, height 5\' 5"  (1.651 m), weight 78.2 kg, SpO2 98 %.  Medical Problem List and Plan: 1.  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   Plan 30-day cardiac event monitor Continue CIR PT, OT, SLP ,  ELOS 12/10  2.  Antithrombotics: -DVT/anticoagulation: amb >150' no need for  Lovenox             -antiplatelet therapy: Aspirin 81 mg daily and Brilinta 90 mg twice daily x30 days then Plavix alone 3. Pain Management: Tylenol as needed 4. Mood: Provide emotional support             -antipsychotic agents: N/A 5. Neuropsych: This patient is capable of making decisions on her own behalf. 6. Skin/Wound Care: Routine skin checks 7. Fluids/Electrolytes/Nutrition: Routine in and outs  8.  Hyperlipidemia.  Crestor 9.  Glaucoma.  Continue eyedrops 10.  History of breast cancer status post bilateral mastectomy.  Follow-up outpatient 11.  Hypertension.  Chlorthalidone discontinued due to bouts of hyponatremia.  Patient on valsartan 320 mg daily as well as chlorthalidone 25mg  daily prior to admission.  Resume as needed Vitals:   07/04/21 1952 07/05/21 0502  BP: (!) 156/57 (!) 163/88  Pulse: 77 86  Resp: 14 14  Temp: 98 F (36.7 C) 98.2 F (36.8 C)  SpO2: 98% 98%   Cozaar 25 daily started on 12/5, will increase dose to 50mg  (is on high dose valsartan at home)- would rec resume valsartan 320mg  per day upon d/c to home   12. Hyponatremia  Sodium 131 on 12/2, 132 on 12/6 stable  LOS: 7 days A FACE TO FACE EVALUATION WAS PERFORMED  Charlett Blake 07/05/2021, 8:56 AM

## 2021-07-05 NOTE — Progress Notes (Signed)
Occupational Therapy Session Note  Patient Details  Name: Sandra Becker MRN: [id number] Date of Birth: Nov 28, 1940  Today's Date: 07/05/2021 OT Individual Time: 7353-2992 OT Individual Time Calculation (min): 80 min    Short Term Goals: Week 1:  OT Short Term Goal 1 (Week 1): Pt will complete LB selfcare with supervision sit to stand. OT Short Term Goal 2 (Week 1): Pt will complete LB dressing with supervision sit to stand. OT Short Term Goal 3 (Week 1): Pt will complete toilet transfers with supervision using the RW for support. OT Short Term Goal 4 (Week 1): Pt will complete walk-in shower transfers with use of the RW and close supervision.  Skilled Therapeutic Interventions/Progress Updates:    Pt worked on bathing and dressing during session per agreement.  She was able to remove all clothing prior to toileting and shower with min guard sit to stand.  She attempted to remove LB clothing in standing but demonstrated some emergent awareness to sit down after removing one pants legs as secondary to balance.  She did need mod instructional cueing to recall the need to remove her hearing aides though prior to shower.  Min guard for completion of toileting as well as showering sit to stand on the sink.  She was able to sequence through the shower more efficiently this session as well without therapist having to cue her.  Dressing was completed sit to stand with min guard however she did demonstrate some difficulty putting her hearing aides back in as she had them backwards and attempted X 2 before therapist had to cue her.  She next completed combing her hair and brushing her teeth in standing with min guard assist.  After ADL tasks were completed, had her ambulate down to the dayroom with min guard assist and no device.  She was able to complete simulated visual recipe task by having to get together the ingredients to fix three different meals by picking up the laminated pictures of the items  listed.  Mod questioning cueing needed to complete 2/3 recipes with increased difficulty verbally explaining the sequence of how to prepare the items.  Finished session with ambulation back to the room where she was left with her spouse and her son.  Talked briefly about progress and need for supervision, which spouse states he can provide.  Encouraged attending therapies tomorrow to see how to assist at discharge.  Call button and phone in reach with safety alarm belt in place.    Therapy Documentation Precautions:  Precautions Precautions: Fall Precaution Comments: slight right inattention, right hemi paresis, impulsive Restrictions Weight Bearing Restrictions: No   Pain: Pain Assessment Pain Scale: Faces Pain Score: 0-No pain ADL: See Care Tool Section for some details of mobility and selfcare  Therapy/Group: Individual Therapy  Phyillis Dascoli OTR/L 07/05/2021, 3:37 PM

## 2021-07-06 NOTE — Progress Notes (Signed)
Speech Language Pathology Discharge Summary  Patient Details  Name: Sandra Becker MRN: [id number] Date of Birth: [birth year]  Today's Date: 07/06/2021 SLP Individual Time: 1300-1345 SLP Individual Time Calculation (min): 45 min  Skilled Therapeutic Interventions:  Skilled ST treatment focused on cognitive and communication goals. SLP facilitated session by providing sup A verbal cues for navigating cell phone by adding and deleting appointments into calendar. Pt also required sup A verbal cues to make outgoing phone call to spouse. Also provided education and demonstration on using hand-free function via [place] as pt reported some difficulty with selection of buttons. Pt utilized with sup A verbal cues due to being a novel task. Facilitated generative naming task in which patient continues to exhibit difficulty (3 words within category in 1 minute) with however improved ability using description strategy. SLP reinforced education on cognitive-communication strategies and recommendations for outpatient speech therapy. Pt verbalized agreement. Patient was left in chair with alarm activated and immediate needs within reach at end of session. Continue per current plan of care.    Patient has met 4 of 4 long term goals.  Patient to discharge at overall Supervision level.  Reasons goals not met: N/A   Clinical Impression/Discharge Summary:   Patient has made functional gains and has met 4 of 4 long-term goals this admission due to improved verbal fluency, problem solving, awareness, and functional recall. Patient is currently an overall supervision A for cognitive and communication tasks and requires verbal cues for utilization of communication strategies, compensatory memory aids, functional problem solving, error awareness, and self monitoring skills. Patient education is complete and patient to discharge at overall supervision level. Patient's care partner is independent to provide the necessary  cognitive assistance at discharge. Patient would benefit from continued SLP services in outpatient setting to maximize cognitive-communication, language, and functional independence.    Care Partner:  Caregiver Able to Provide Assistance: Yes  Type of Caregiver Assistance: Cognitive  Recommendation:  Outpatient SLP  Rationale for SLP Follow Up: Maximize functional communication;Maximize cognitive function and independence;Reduce caregiver burden   Equipment: N/A   Reasons for discharge: Discharged from hospital;Treatment goals met   Patient/Family Agrees with Progress Made and Goals Achieved: Yes    Lional Icenogle T Doyal Saric 07/06/2021, 4:30 PM

## 2021-07-06 NOTE — Progress Notes (Signed)
Physical Therapy Discharge Summary  Patient Details  Name: Sandra Becker MRN: [id number] Date of Birth: May 21, 1941  Today's Date: 07/06/2021 PT Individual Time: 0800-0858 PT Individual Time Calculation (min): 58 min   Daily session: Patient received standing at sink brushing teeth with RN present. She reports 3/10 pain in L hip, not medicated yet. PT providing rest breaks, distractions and repositioning to assist with pain management. Patient completing dc assessment as noted below. She scored a 43/56 on the Berg and an 11 on the FGA indicating increased risk for falling- discussed with patient and she verbalized understanding. Patient ambulating .26ms indicating benefit from follow up therapy to address fall risk- patient verbalized understanding. MD in/out for assessment. PT providing patient with HEP. She ambulated back to her room with RW and supervision. Up in recliner, seatbelt alarm on, call light within reach.   Patient has met 7 of 8 long term goals due to improved activity tolerance, improved balance, improved postural control, increased strength, ability to compensate for deficits, functional use of  right upper extremity and right lower extremity, improved attention, improved awareness, and improved coordination.  Patient to discharge at an ambulatory level Supervision.   Patient's care partner is independent to provide the necessary physical and cognitive assistance at discharge.  Reasons goals not met: Patient requires MinA for floor transfer due to weakness and poor motor planning. Education on fall precautions, fall risk and calling 911 in the event that she does have a fall. Patient verbalized understanding.   Recommendation:  Patient will benefit from ongoing skilled PT services in outpatient setting to continue to advance safe functional mobility, address ongoing impairments in R attention, dynamic balance, gait progressions, and minimize fall risk.  Equipment: No  equipment provided  Reasons for discharge: treatment goals met and discharge from hospital  Patient/family agrees with progress made and goals achieved: Yes  PT Discharge Precautions/Restrictions Precautions Precautions: Fall Precaution Comments: slight right inattention, right hemi paresis, impulsive Restrictions Weight Bearing Restrictions: No Vital Signs Therapy Vitals Temp: 97.9 F (36.6 C) Temp Source: Oral Pulse Rate: 83 Resp: 16 BP: (!) 151/73 Patient Position (if appropriate): Lying Oxygen Therapy SpO2: 97 % O2 Device: Room Air Pain Pain Assessment Pain Scale: 0-10 Pain Score: 3  Pain Type: Acute pain Pain Location: Hip Pain Orientation: Left Pain Descriptors / Indicators: Aching Pain Interference Pain Interference Pain Effect on Sleep: 1. Rarely or not at all Pain Interference with Therapy Activities: 1. Rarely or not at all Pain Interference with Day-to-Day Activities: 1. Rarely or not at all Vision/Perception  Vision - History Ability to See in Adequate Light: 0 Adequate Perception Perception: Impaired Inattention/Neglect: Does not attend to right side of body Praxis Praxis: Impaired Praxis Impairment Details: Motor planning  Cognition Overall Cognitive Status: Impaired/Different from baseline Arousal/Alertness: Awake/alert Orientation Level: Oriented X4 Sustained Attention: Appears intact Selective Attention: Impaired Memory: Impaired Awareness: Impaired Problem Solving: Impaired Sequencing: Impaired Organizing: Impaired Behaviors: Impulsive Safety/Judgment: Impaired Sensation Sensation Light Touch: Appears Intact Hot/Cold: Appears Intact Proprioception: Appears Intact Stereognosis: Appears Intact Coordination Gross Motor Movements are Fluid and Coordinated: No Fine Motor Movements are Fluid and Coordinated: No Heel Shin Test: slightly limited AROM R LE Motor  Motor Motor: Hemiplegia;Abnormal postural alignment and control Motor -  Skilled Clinical Observations: slight R hemi Motor - Discharge Observations: slight R hemi with poor attention R side  Mobility Bed Mobility Bed Mobility: Rolling Right;Rolling Left;Sit to Supine;Supine to Sit Rolling Right: Independent Rolling Left: Independent Supine to Sit: Independent Sit to  Supine: Independent Transfers Transfers: Sit to Stand;Stand to Sit;Stand Pivot Transfers Sit to Stand: Supervision/Verbal cueing Stand to Sit: Supervision/Verbal cueing Stand Pivot Transfers: Supervision/Verbal cueing Stand Pivot Transfer Details: Verbal cues for precautions/safety Locomotion  Gait Ambulation: Yes Gait Assistance: Supervision/Verbal cueing Gait Distance (Feet): 200 Feet Gait Assistance Details: Verbal cues for precautions/safety Gait Gait: Yes Gait Pattern: Impaired Gait Pattern: Step-through pattern;Trunk flexed;Wide base of support;Trendelenburg Gait velocity: Decreased Stairs / Additional Locomotion Stairs: Yes Stairs Assistance: Supervision/Verbal cueing Stair Management Technique: Two rails Number of Stairs: 12 Height of Stairs: 6 Ramp: Supervision/Verbal cueing Curb: Supervision/Verbal cueing Pick up small object from the floor assist level: Supervision/Verbal cueing Wheelchair Mobility Wheelchair Mobility: No  Trunk/Postural Assessment  Cervical Assessment Cervical Assessment: Exceptions to [facility] Thoracic Assessment Thoracic Assessment: Exceptions to [facility] Lumbar Assessment Lumbar Assessment: Exceptions to [facility] Postural Control Postural Control: Deficits on evaluation Righting Reactions: delayed and inadequate Protective Responses: delayed and inadequate  Balance Balance Balance Assessed: Yes Standardized Balance Assessment Standardized Balance Assessment: Berg Balance Test;Functional Gait Assessment (Gait speed: .38ms) Berg Balance Test Sit to Stand: Able to stand  independently using hands Standing Unsupported: Able to stand safely 2  minutes Sitting with Back Unsupported but Feet Supported on Floor or Stool: Able to sit safely and securely 2 minutes Stand to Sit: Controls descent by using hands Transfers: Able to transfer safely, minor use of hands Standing Unsupported with Eyes Closed: Able to stand 10 seconds safely Standing Ubsupported with Feet Together: Able to place feet together independently and stand 1 minute safely From Standing, Reach Forward with Outstretched Arm: Can reach forward >12 cm safely (5") From Standing Position, Pick up Object from Floor: Able to pick up shoe safely and easily From Standing Position, Turn to Look Behind Over each Shoulder: Looks behind from both sides and weight shifts well Turn 360 Degrees: Able to turn 360 degrees safely but slowly Standing Unsupported, Alternately Place Feet on Step/Stool: Able to complete >2 steps/needs minimal assist Standing Unsupported, One Foot in Front: Able to take small step independently and hold 30 seconds Standing on One Leg: Tries to lift leg/unable to hold 3 seconds but remains standing independently Total Score: 43 Static Sitting Balance Static Sitting - Balance Support: Feet supported Static Sitting - Level of Assistance: 7: Independent Dynamic Sitting Balance Dynamic Sitting - Balance Support: Feet unsupported;During functional activity Dynamic Sitting - Level of Assistance: 7: Independent Static Standing Balance Static Standing - Balance Support: During functional activity;Bilateral upper extremity supported Static Standing - Level of Assistance: 5: Stand by assistance Dynamic Standing Balance Dynamic Standing - Balance Support: During functional activity Dynamic Standing - Level of Assistance: 5: Stand by assistance Functional Gait  Assessment Gait assessed : Yes Gait Level Surface: Walks 20 ft in less than 7 sec but greater than 5.5 sec, uses assistive device, slower speed, mild gait deviations, or deviates 6-10 in outside of the 12 in  walkway width. Change in Gait Speed: Makes only minor adjustments to walking speed, or accomplishes a change in speed with significant gait deviations, deviates 10-15 in outside the 12 in walkway width, or changes speed but loses balance but is able to recover and continue walking. Gait with Horizontal Head Turns: Performs head turns with moderate changes in gait velocity, slows down, deviates 10-15 in outside 12 in walkway width but recovers, can continue to walk. Gait with Vertical Head Turns: Performs task with slight change in gait velocity (eg, minor disruption to smooth gait path), deviates 6 - 10 in outside  12 in walkway width or uses assistive device Gait and Pivot Turn: Pivot turns safely in greater than 3 sec and stops with no loss of balance, or pivot turns safely within 3 sec and stops with mild imbalance, requires small steps to catch balance. Step Over Obstacle: Cannot perform without assistance. Gait with Narrow Base of Support: Ambulates less than 4 steps heel to toe or cannot perform without assistance. Gait with Eyes Closed: Cannot walk 20 ft without assistance, severe gait deviations or imbalance, deviates greater than 15 in outside 12 in walkway width or will not attempt task. Ambulating Backwards: Walks 20 ft, slow speed, abnormal gait pattern, evidence for imbalance, deviates 10-15 in outside 12 in walkway width. Steps: Alternating feet, must use rail. Total Score: 11 Extremity Assessment      RLE Assessment RLE Assessment: Exceptions to WFL General Strength Comments: Grossly 4+/5 LLE Assessment LLE Assessment: Exceptions to WFL General Strength Comments: grossly 4+/5     A  07/06/2021, 8:40 AM 

## 2021-07-06 NOTE — Progress Notes (Signed)
Patient ID: Sandra Becker, female   DOB: 1940-12-17, 80 y.o.   MRN: [id number]  Patient d/c information provided to pt spouse. Spouse will pick patient up for d/c tomorrow between 9-11AM.  Erlene Quan, [place]

## 2021-07-06 NOTE — Progress Notes (Signed)
Inpatient Rehabilitation Care Coordinator Discharge Note   Patient Details  Name: Sandra Becker MRN: [id number] Date of Birth: 1941/05/02   Discharge location: Home  Length of Stay: 9 Days  Discharge activity level: sup/cga  Home/community participation: spouse  Patient response QZ:RAQTMA Literacy - How often do you need to have someone help you when you read instructions, pamphlets, or other written material from your doctor or pharmacy?: Rarely  Patient response UQ:JFHLKT Isolation - How often do you feel lonely or isolated from those around you?: Never  Services provided included: SW, Pharmacy, TR, CM, RN, SLP, OT, PT, RD, MD  Financial Services:  Financial Services Utilized: Medicare    Choices offered to/list presented to: pt and spouse  Follow-up services arranged:  Outpatient    Outpatient Servicies: neuro op      Patient response to transportation need: Is the patient able to respond to transportation needs?: Yes In the past 12 months, has lack of transportation kept you from medical appointments or from getting medications?: No In the past 12 months, has lack of transportation kept you from meetings, work, or from getting things needed for daily living?: No    Comments (or additional information):  Patient/Family verbalized understanding of follow-up arrangements:  Yes  Individual responsible for coordination of the follow-up plan: pt or spouse  Confirmed correct DME delivered: Dyanne Iha 07/06/2021    Dyanne Iha

## 2021-07-06 NOTE — Plan of Care (Signed)
  Problem: RH Floor Transfers Goal: LTG Patient will perform floor transfers w/assist (PT) Description: LTG: Patient will perform floor transfers with assistance (PT). Outcome: Not Met (add Reason) Flowsheets (Taken 07/06/2021 1211) LTG: PT WILL PERFORM FLOOR TRANFERS  WITH  ASSIST:: (MinA, poor motor planning) --   Problem: RH Balance Goal: LTG Patient will maintain dynamic sitting balance (PT) Description: LTG:  Patient will maintain dynamic sitting balance with assistance during mobility activities (PT) Outcome: Completed/Met Goal: LTG Patient will maintain dynamic standing balance (PT) Description: LTG:  Patient will maintain dynamic standing balance with assistance during mobility activities (PT) Outcome: Completed/Met   Problem: Sit to Stand Goal: LTG:  Patient will perform sit to stand with assistance level (PT) Description: LTG:  Patient will perform sit to stand with assistance level (PT) Outcome: Completed/Met   Problem: RH Bed Mobility Goal: LTG Patient will perform bed mobility with assist (PT) Description: LTG: Patient will perform bed mobility with assistance, with/without cues (PT). Outcome: Completed/Met   Problem: RH Bed to Chair Transfers Goal: LTG Patient will perform bed/chair transfers w/assist (PT) Description: LTG: Patient will perform bed to chair transfers with assistance (PT). Outcome: Completed/Met   Problem: RH Ambulation Goal: LTG Patient will ambulate in controlled environment (PT) Description: LTG: Patient will ambulate in a controlled environment, # of feet with assistance (PT). Outcome: Completed/Met Goal: LTG Patient will ambulate in home environment (PT) Description: LTG: Patient will ambulate in home environment, # of feet with assistance (PT). Outcome: Completed/Met

## 2021-07-06 NOTE — Progress Notes (Signed)
Occupational Therapy Session Note  Patient Details  Name: Sandra Becker MRN: [id number] Date of Birth: 07/23/1941  Today's Date: 07/06/2021 OT Individual Time: 8657-8469 OT Individual Time Calculation (min): 25 min    Short Term Goals: Week 1:  OT Short Term Goal 1 (Week 1): Pt will complete LB selfcare with supervision sit to stand. OT Short Term Goal 2 (Week 1): Pt will complete LB dressing with supervision sit to stand. OT Short Term Goal 3 (Week 1): Pt will complete toilet transfers with supervision using the RW for support. OT Short Term Goal 4 (Week 1): Pt will complete walk-in shower transfers with use of the RW and close supervision.  Skilled Therapeutic Interventions/Progress Updates:  Skilled OT intervention completed with focus on functional ambulation and RUE coordination tasks. Pt received seated in recliner, agreeable to session. Sit > stand with CGA, ambulatory throughout room without device at CGA to retrieve mask, with pt verbally reporting desire for RW to ambulate to gym, with pt ambulating at a steady pace using RW with CGA to 4W gym. Occasional safety cues needed when close to objects on lower L, presumably from visual field cut. Pt c/o pain in L hip, with desire to complete seated tasks. Participated in push pin activity with RUE, to promote increased cooridination needed for self-care tasks. Pt able to complete with supervision, pulling push pins from cork board, placing onto design, then taking off at end of activity. Sit > stand and ambulatory transfer back to room using RW with CGA-supervision, then left seated in recliner, with belt alarm on and all needs in reach at end of session. RN notified of need for pain meds.  Therapy Documentation Precautions:  Precautions Precautions: Fall Precaution Comments: slight right inattention, right hemi paresis, impulsive Restrictions Weight Bearing Restrictions: No  Pain: Unrated pain in L hip, RN notified for pain med  intervention   Therapy/Group: Individual Therapy  [name] 07/06/2021, 7:51 AM

## 2021-07-06 NOTE — Progress Notes (Signed)
Occupational Therapy Discharge Summary  Patient Details  Name: Sandra Becker MRN: [id number] Date of Birth: [birth year]  Today's Date: 07/06/2021 OT Individual Time: 1420-1530 OT Individual Time Calculation (min): 70 min   Session Note:  Pt sitting in the recliner to start with completion of toilet transfer and toileting tasks at supervision level without any assistive device.  She was able to ambulate down to the therapy gym with supervision.  Had her work on BUE therex with use of the red therapy band with mod demonstrational cueing.  She was able to complete 1 set of 10 reps for shoulder flexion, horizontal abduction, external rotation, elbow flexion, and elbow extension following handout.  Also issued coordination handout and reviewed it.   Had her work on standing balance as well with toe taps to a 5" step.  Min assist needed for balance while completing this task.  Returned to the room at the end of the session with transfer to the bed to rest.  Call button and phone in reach with safety alarm in place.    Patient has met 14 of 14 long term goals due to improved balance, postural control, ability to compensate for deficits, functional use of  RIGHT upper extremity, improved awareness, and improved coordination.  Patient to discharge at overall Supervision level.  Patient's care partner is independent to provide the necessary physical and cognitive assistance at discharge.    Reasons goals not met: NA  Recommendation:  Patient will benefit from ongoing skilled OT services in outpatient setting to continue to advance functional skills in the area of BADL, iADL, and Reduce care partner burden.  Pt continues to demonstrate some motor planning deficits as well as some right side inattention, decreased dynamic balance, and RUE coordination deficits.  Feel she will benefit from ongoing outpatient OT at this time.    Equipment: No equipment provided  Reasons for discharge: treatment goals met  and discharge from hospital  Patient/family agrees with progress made and goals achieved: Yes  OT Discharge Precautions/Restrictions  Precautions Precautions: Fall Precaution Comments: slight right inattention, right hemi paresis, impulsive Restrictions Weight Bearing Restrictions: No  Pain Pain Assessment Pain Scale: Faces Pain Score: 0-No pain ADL ADL Eating: Independent Where Assessed-Eating: Chair Grooming: Independent Where Assessed-Grooming: Standing at sink Upper Body Bathing: Supervision/safety Where Assessed-Upper Body Bathing: Shower, Chair Lower Body Bathing: Supervision/safety Where Assessed-Lower Body Bathing: Chair, Shower Upper Body Dressing: Independent Where Assessed-Upper Body Dressing: Chair Lower Body Dressing: Supervision/safety Where Assessed-Lower Body Dressing: Chair Toileting: Supervision/safety Where Assessed-Toileting: Glass blower/designer: Close supervision, Distant supervision Toilet Transfer Method: Counselling psychologist: Energy manager: Close supervison Clinical cytogeneticist Method: Magazine features editor: Close supervision Social research officer, government Method: Heritage manager: Gaffer Baseline Vision/History: 1 Wears glasses Patient Visual Report: No change from baseline Vision Assessment?: Yes Eye Alignment: Within Functional Limits Ocular Range of Motion: Within Functional Limits Alignment/Gaze Preference: Within Defined Limits Tracking/Visual Pursuits: Decreased smoothness of horizontal tracking;Decreased smoothness of vertical tracking Convergence: Within functional limits Visual Fields: No apparent deficits Perception  Perception: Impaired Comments: Slight right attention deficits still noted at times Praxis Praxis: Impaired Praxis Impairment Details: Motor planning Praxis-Other Comments: slight motor planning deficits noted as well Cognition Overall  Cognitive Status: Impaired/Different from baseline Arousal/Alertness: Awake/alert Orientation Level: Oriented X4 Year: 2022 Month: December Day of Week: Correct Attention: Sustained;Selective Sustained Attention: Appears intact Sustained Attention Impairment: Verbal basic;Functional basic Selective Attention: Impaired Selective Attention Impairment: Functional complex;Verbal complex;Verbal basic Memory:  Impaired Memory Impairment: Decreased recall of new information Decreased Short Term Memory: Verbal basic;Functional basic Immediate Memory Recall: Sock;Blue;Bed Memory Recall Sock: Without Cue Memory Recall Blue: Without Cue Memory Recall Bed: Without Cue Awareness: Impaired Awareness Impairment: Anticipatory impairment Problem Solving: Impaired Problem Solving Impairment: Functional complex;Verbal complex Sequencing: Impaired Organizing: Impaired Behaviors: Impulsive Safety/Judgment: Impaired Sensation Sensation Light Touch: Appears Intact Hot/Cold: Appears Intact Proprioception: Appears Intact Stereognosis: Appears Intact Coordination Gross Motor Movements are Fluid and Coordinated: No Fine Motor Movements are Fluid and Coordinated: No Coordination and Movement Description: Still with slightly slower gross and FM coordination noted in the RUE but uses functionally at a diminshed level for selfcare tasks. 9 Hole Peg Test: 28 secs on the left and 47 on the right Motor  Motor Motor: Hemiplegia;Abnormal postural alignment and control Motor - Discharge Observations: slight R hemiparesis still present but mild Mobility  Bed Mobility Bed Mobility: Supine to Sit;Sit to Supine Supine to Sit: Independent Sit to Supine: Independent Transfers Sit to Stand: Supervision/Verbal cueing Stand to Sit: Supervision/Verbal cueing  Trunk/Postural Assessment  Cervical Assessment Cervical Assessment: Within Functional Limits Thoracic Assessment Thoracic Assessment: Within Functional  Limits Lumbar Assessment Lumbar Assessment: Within Functional Limits  Balance Balance Balance Assessed: Yes Static Sitting Balance Static Sitting - Balance Support: Feet supported Static Sitting - Level of Assistance: 7: Independent Dynamic Sitting Balance Dynamic Sitting - Balance Support: Feet unsupported;During functional activity Dynamic Sitting - Level of Assistance: 7: Independent Static Standing Balance Static Standing - Balance Support: During functional activity;Bilateral upper extremity supported Static Standing - Level of Assistance: 7: Independent Dynamic Standing Balance Dynamic Standing - Balance Support: During functional activity Dynamic Standing - Level of Assistance: 5: Stand by assistance Extremity/Trunk Assessment RUE Assessment RUE Assessment: Exceptions to [facility] Passive Range of Motion (PROM) Comments: WFLS Active Range of Motion (AROM) Comments: WFLs General Strength Comments: strength 4/5 throughout with grip strength at only 18 lbs.  Decreased finger to nose speed as well as decreased FM coordination noted when attempting to manipulate objects in hand LUE Assessment LUE Assessment: Within Functional Limits General Strength Comments: strength 5/5 throughout   Dezaria Methot OTR/L 07/06/2021, 4:58 PM

## 2021-07-06 NOTE — Progress Notes (Signed)
Patient ID: Sandra Becker, female   DOB: 10-Aug-1940, 80 y.o.   MRN: [id number]  Rolling Walker ordered through [place].

## 2021-07-06 NOTE — Plan of Care (Signed)
  Problem: RH Expression Communication Goal: LTG Patient will increase word finding of common (SLP) Description: LTG:  Patient will increase word finding of common objects/daily info/abstract thoughts with cues using compensatory strategies (SLP). Outcome: Completed/Met   Problem: RH Problem Solving Goal: LTG Patient will demonstrate problem solving for (SLP) Description: LTG:  Patient will demonstrate problem solving for basic/complex daily situations with cues  (SLP) Outcome: Completed/Met   Problem: RH Memory Goal: LTG Patient will use memory compensatory aids to (SLP) Description: LTG:  Patient will use memory compensatory aids to recall biographical/new, daily complex information with cues (SLP) Outcome: Completed/Met   Problem: RH Awareness Goal: LTG: Patient will demonstrate awareness during functional activites type of (SLP) Description: LTG: Patient will demonstrate awareness during functional activites type of (SLP) Outcome: Completed/Met   

## 2021-07-06 NOTE — Progress Notes (Signed)
Inpatient Rehabilitation Discharge Medication Review by a Pharmacist  A complete drug regimen review was completed for this patient to identify any potential clinically significant medication issues.  High Risk Drug Classes Is patient taking? Indication by Medication  Antipsychotic No   Anticoagulant No   Antibiotic No   Opioid No   Antiplatelet Yes Stroke: ticagrelor through 12/28 followed by Plavix starting 07/26/2021, Aspirin  Hypoglycemics/insulin No   Vasoactive Medication Yes Valsartan- hypertension  Chemotherapy No   Other No      Type of Medication Issue Identified Description of Issue Recommendation(s)  Drug Interaction(s) (clinically significant)     Duplicate Therapy     Allergy     No Medication Administration End Date     Incorrect Dose     Additional Drug Therapy Needed     Significant med changes from prior encounter (inform family/care partners about these prior to discharge).    Other  PTA MVI, fish oil, vitamin C, Calcium Restarted at discharge from CIR    Clinically significant medication issues were identified that warrant physician communication and completion of prescribed/recommended actions by midnight of the next day:  No   Time spent performing this drug regimen review (minutes):  30   Thank you for allowing [place] to participate in this patients care. Vaughan Basta, BS, PharmD, BCPS 07/06/2021 9:36 AM  **Pharmacist phone directory can be found on [email or web address] listed under [place]**

## 2021-07-06 NOTE — Progress Notes (Addendum)
PROGRESS NOTE   Subjective/Complaints:  No issues overnite but has left hip , lateral pain this am , no falls or truama  ROS: Denies CP, SOB, N/V/D  Objective:   No results found. No results for input(s): WBC, HGB, HCT, PLT in the last 72 hours.  No results for input(s): NA, K, CL, CO2, GLUCOSE, BUN, CREATININE, CALCIUM in the last 72 hours.   Intake/Output Summary (Last 24 hours) at 07/06/2021 0855 Last data filed at 07/06/2021 0848 Gross per 24 hour  Intake 820 ml  Output --  Net 820 ml         Physical Exam: Vital Signs Blood pressure (!) 151/73, pulse 83, temperature 97.9 F (36.6 C), temperature source Oral, resp. rate 16, height 5\' 5"  (1.651 m), weight 78.2 kg, SpO2 97 %.   General: No acute distress Mood and affect are appropriate Heart: Regular rate and rhythm no rubs murmurs or extra sounds Lungs: Clear to auscultation, breathing unlabored, no rales or wheezes Abdomen: Positive bowel sounds, soft nontender to palpation, nondistended Extremities: No clubbing, cyanosis, or edema Skin: No evidence of breakdown, no evidence of rash Neuro: Alert Mild/Mod Expressive deficits Motor: Grossly 5/5 in BUE an BLE Amb without assistive device, no limb ataxia, unable to perform tandem gait No ataxia b/l UE MSK mild Left hip troch tenderness , pain with int rotation of L hip  Assessment/Plan: 1. Functional deficits which require 3+ hours per day of interdisciplinary therapy in a comprehensive inpatient rehab setting. Physiatrist is providing close team supervision and 24 hour management of active medical problems listed below. Physiatrist and rehab team continue to assess barriers to discharge/monitor patient progress toward functional and medical goals  Care Tool:  Bathing    Body parts bathed by patient: Right arm, Left arm, Chest, Abdomen, Front perineal area, Buttocks, Right upper leg, Left upper leg, Right  lower leg, Left lower leg, Face         Bathing assist Assist Level: Contact Guard/Touching assist     Upper Body Dressing/Undressing Upper body dressing   What is the patient wearing?: Pull over shirt    Upper body assist Assist Level: Set up assist    Lower Body Dressing/Undressing Lower body dressing      What is the patient wearing?: Underwear/pull up, Pants     Lower body assist Assist for lower body dressing: Supervision/Verbal cueing     Toileting Toileting    Toileting assist Assist for toileting: Contact Guard/Touching assist     Transfers Chair/bed transfer  Transfers assist     Chair/bed transfer assist level: Supervision/Verbal cueing Chair/bed transfer assistive device: Armrests   Locomotion Ambulation   Ambulation assist      Assist level: Supervision/Verbal cueing Assistive device: No Device Max distance: 200   Walk 10 feet activity   Assist     Assist level: Supervision/Verbal cueing Assistive device: No Device   Walk 50 feet activity   Assist    Assist level: Supervision/Verbal cueing Assistive device: No Device    Walk 150 feet activity   Assist    Assist level: Supervision/Verbal cueing Assistive device: No Device    Walk 10 feet on uneven surface  activity   Assist Walk 10 feet on uneven surfaces activity did not occur: Safety/medical concerns   Assist level: Supervision/Verbal cueing     Wheelchair     Assist Is the patient using a wheelchair?: No             Wheelchair 50 feet with 2 turns activity    Assist            Wheelchair 150 feet activity     Assist          Blood pressure (!) 151/73, pulse 83, temperature 97.9 F (36.6 C), temperature source Oral, resp. rate 16, height 5\' 5"  (1.651 m), weight 78.2 kg, SpO2 97 %.  Medical Problem List and Plan: 1.  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  Plan 30-day cardiac event monitor Continue CIR PT, OT, SLP ,  ELOS 12/10  2.  Antithrombotics: -DVT/anticoagulation: amb >150' no need for  Lovenox             -antiplatelet therapy: Aspirin 81 mg daily and Brilinta 90 mg twice daily x30 days then Plavix alone 3. Pain Management: Tylenol as needed Lateral hip pain , external rotator insertional pain , stretching ed per PT 4. Mood: Provide emotional support             -antipsychotic agents: N/A 5. Neuropsych: This patient is capable of making decisions on her own behalf. 6. Skin/Wound Care: Routine skin checks 7. Fluids/Electrolytes/Nutrition: Routine in and outs  8.  Hyperlipidemia.  Crestor 9.  Glaucoma.  Continue eyedrops 10.  History of breast cancer status post bilateral mastectomy.  Follow-up outpatient 11.  Hypertension.  Chlorthalidone discontinued due to bouts of hyponatremia.  Patient on valsartan 320 mg daily as well as chlorthalidone 25mg  daily prior to admission.  Resume as needed Vitals:   07/06/21 0422 07/06/21 0455  BP: (!) 163/71 (!) 151/73  Pulse: 89 83  Resp: 16 16  Temp: 97.9 F (36.6 C) 97.9 F (36.6 C)  SpO2: 100% 97%   Cozaar 25 daily started on 12/5, will increase dose to 50mg  (is on high dose valsartan at home)- would rec resume valsartan 320mg  per day upon d/c to home   12. Hyponatremia  Sodium 131 on 12/2, 132 on 12/6 stable  LOS: 8 days A FACE TO FACE EVALUATION WAS PERFORMED  Charlett Blake 07/06/2021, 8:55 AM

## 2021-07-07 NOTE — Progress Notes (Signed)
LBM 7th. PRN senna S given at 2101, small results during night. Patrici Ranks A

## 2021-07-07 NOTE — Progress Notes (Signed)
Pt discharge instructions provided to pt and family by PA on 07/06/21, verbalized an understanding of instructions as well medications. Family already picked up equipment prior to discharge.

## 2021-07-08 ENCOUNTER — Emergency Department (HOSPITAL_COMMUNITY): Payer: Medicare Other

## 2021-07-08 ENCOUNTER — Observation Stay (HOSPITAL_COMMUNITY): Payer: Medicare Other

## 2021-07-08 ENCOUNTER — Other Ambulatory Visit: Payer: Self-pay

## 2021-07-08 ENCOUNTER — Inpatient Hospital Stay (HOSPITAL_COMMUNITY)
Admission: EM | Admit: 2021-07-08 | Discharge: 2021-07-10 | DRG: 065 | Disposition: A | Discharge status: Home / Self Care | Payer: Medicare Other | Attending: Internal Medicine | Admitting: Internal Medicine

## 2021-07-08 ENCOUNTER — Encounter (HOSPITAL_COMMUNITY): Payer: Self-pay | Admitting: Emergency Medicine

## 2021-07-08 DIAGNOSIS — I672 Cerebral atherosclerosis: Secondary | ICD-10-CM | POA: Diagnosis present

## 2021-07-08 DIAGNOSIS — R29810 Facial weakness: Secondary | ICD-10-CM | POA: Diagnosis present

## 2021-07-08 DIAGNOSIS — R55 Syncope and collapse: Secondary | ICD-10-CM | POA: Diagnosis not present

## 2021-07-08 DIAGNOSIS — I6523 Occlusion and stenosis of bilateral carotid arteries: Secondary | ICD-10-CM | POA: Diagnosis not present

## 2021-07-08 DIAGNOSIS — Z888 Allergy status to other drugs, medicaments and biological substances status: Secondary | ICD-10-CM

## 2021-07-08 DIAGNOSIS — Z853 Personal history of malignant neoplasm of breast: Secondary | ICD-10-CM

## 2021-07-08 DIAGNOSIS — R2990 Unspecified symptoms and signs involving the nervous system: Secondary | ICD-10-CM | POA: Diagnosis not present

## 2021-07-08 DIAGNOSIS — E782 Mixed hyperlipidemia: Secondary | ICD-10-CM | POA: Diagnosis present

## 2021-07-08 DIAGNOSIS — W19XXXA Unspecified fall, initial encounter: Secondary | ICD-10-CM | POA: Diagnosis present

## 2021-07-08 DIAGNOSIS — Z9013 Acquired absence of bilateral breasts and nipples: Secondary | ICD-10-CM | POA: Diagnosis not present

## 2021-07-08 DIAGNOSIS — E871 Hypo-osmolality and hyponatremia: Secondary | ICD-10-CM | POA: Diagnosis present

## 2021-07-08 DIAGNOSIS — I119 Hypertensive heart disease without heart failure: Secondary | ICD-10-CM | POA: Diagnosis present

## 2021-07-08 DIAGNOSIS — E785 Hyperlipidemia, unspecified: Secondary | ICD-10-CM | POA: Diagnosis not present

## 2021-07-08 DIAGNOSIS — I69351 Hemiplegia and hemiparesis following cerebral infarction affecting right dominant side: Secondary | ICD-10-CM | POA: Diagnosis not present

## 2021-07-08 DIAGNOSIS — I6381 Other cerebral infarction due to occlusion or stenosis of small artery: Principal | ICD-10-CM | POA: Diagnosis present

## 2021-07-08 DIAGNOSIS — I6389 Other cerebral infarction: Secondary | ICD-10-CM | POA: Diagnosis not present

## 2021-07-08 DIAGNOSIS — Z8673 Personal history of transient ischemic attack (TIA), and cerebral infarction without residual deficits: Secondary | ICD-10-CM | POA: Diagnosis not present

## 2021-07-08 DIAGNOSIS — Z91013 Allergy to seafood: Secondary | ICD-10-CM | POA: Diagnosis not present

## 2021-07-08 DIAGNOSIS — I69320 Aphasia following cerebral infarction: Secondary | ICD-10-CM

## 2021-07-08 DIAGNOSIS — G319 Degenerative disease of nervous system, unspecified: Secondary | ICD-10-CM | POA: Diagnosis not present

## 2021-07-08 DIAGNOSIS — R29818 Other symptoms and signs involving the nervous system: Secondary | ICD-10-CM | POA: Diagnosis not present

## 2021-07-08 DIAGNOSIS — R4182 Altered mental status, unspecified: Secondary | ICD-10-CM | POA: Diagnosis present

## 2021-07-08 DIAGNOSIS — Z87891 Personal history of nicotine dependence: Secondary | ICD-10-CM | POA: Diagnosis not present

## 2021-07-08 DIAGNOSIS — I10 Essential (primary) hypertension: Secondary | ICD-10-CM | POA: Diagnosis present

## 2021-07-08 DIAGNOSIS — G459 Transient cerebral ischemic attack, unspecified: Secondary | ICD-10-CM | POA: Diagnosis not present

## 2021-07-08 DIAGNOSIS — R404 Transient alteration of awareness: Secondary | ICD-10-CM | POA: Diagnosis not present

## 2021-07-08 DIAGNOSIS — I639 Cerebral infarction, unspecified: Secondary | ICD-10-CM | POA: Diagnosis not present

## 2021-07-08 DIAGNOSIS — R29702 NIHSS score 2: Secondary | ICD-10-CM | POA: Diagnosis present

## 2021-07-08 DIAGNOSIS — Z20822 Contact with and (suspected) exposure to covid-19: Secondary | ICD-10-CM | POA: Diagnosis present

## 2021-07-08 DIAGNOSIS — R4701 Aphasia: Secondary | ICD-10-CM | POA: Diagnosis not present

## 2021-07-08 DIAGNOSIS — R7303 Prediabetes: Secondary | ICD-10-CM | POA: Diagnosis present

## 2021-07-08 DIAGNOSIS — Y92009 Unspecified place in unspecified non-institutional (private) residence as the place of occurrence of the external cause: Secondary | ICD-10-CM

## 2021-07-08 DIAGNOSIS — H409 Unspecified glaucoma: Secondary | ICD-10-CM | POA: Diagnosis present

## 2021-07-08 LAB — DIFFERENTIAL
Abs Immature Granulocytes: 0.06 10*3/uL (ref 0.00–0.07)
Basophils Absolute: 0.1 10*3/uL (ref 0.0–0.1)
Basophils Relative: 1 %
Eosinophils Absolute: 0.2 10*3/uL (ref 0.0–0.5)
Eosinophils Relative: 2 %
Immature Granulocytes: 1 %
Lymphocytes Relative: 17 %
Lymphs Abs: 1.9 10*3/uL (ref 0.7–4.0)
Monocytes Absolute: 1 10*3/uL (ref 0.1–1.0)
Monocytes Relative: 9 %
Neutro Abs: 7.7 10*3/uL (ref 1.7–7.7)
Neutrophils Relative %: 70 %

## 2021-07-08 LAB — COMPREHENSIVE METABOLIC PANEL
ALT: 32 U/L (ref 0–44)
AST: 25 U/L (ref 15–41)
Albumin: 3.7 g/dL (ref 3.5–5.0)
Alkaline Phosphatase: 48 U/L (ref 38–126)
Anion gap: 11 (ref 5–15)
BUN: 15 mg/dL (ref 8–23)
CO2: 22 mmol/L (ref 22–32)
Calcium: 9.6 mg/dL (ref 8.9–10.3)
Chloride: 94 mmol/L — ABNORMAL LOW (ref 98–111)
Creatinine, Ser: 0.77 mg/dL (ref 0.44–1.00)
GFR, Estimated: >60 mL/min (ref >60)
Glucose, Bld: 145 mg/dL — ABNORMAL HIGH (ref 70–99)
Potassium: 3.9 mmol/L (ref 3.5–5.1)
Sodium: 127 mmol/L — ABNORMAL LOW (ref 135–145)
Total Bilirubin: 0.8 mg/dL (ref 0.3–1.2)
Total Protein: 6.7 g/dL (ref 6.5–8.1)

## 2021-07-08 LAB — I-STAT CHEM 8, ED
BUN: 16 mg/dL (ref 8–23)
Calcium, Ion: 1.08 mmol/L — ABNORMAL LOW (ref 1.15–1.40)
Chloride: 95 mmol/L — ABNORMAL LOW (ref 98–111)
Creatinine, Ser: 0.7 mg/dL (ref 0.44–1.00)
Glucose, Bld: 146 mg/dL — ABNORMAL HIGH (ref 70–99)
HCT: 45 % (ref 36.0–46.0)
Hemoglobin: 15.3 g/dL — ABNORMAL HIGH (ref 12.0–15.0)
Potassium: 3.6 mmol/L (ref 3.5–5.1)
Sodium: 129 mmol/L — ABNORMAL LOW (ref 135–145)
TCO2: 23 mmol/L (ref 22–32)

## 2021-07-08 LAB — RESP PANEL BY RT-PCR (FLU A&B, COVID) ARPGX2
Influenza A by PCR: NEGATIVE
Influenza B by PCR: NEGATIVE
SARS Coronavirus 2 by RT PCR: NEGATIVE

## 2021-07-08 LAB — CBC
HCT: 42.4 % (ref 36.0–46.0)
Hemoglobin: 14.2 g/dL (ref 12.0–15.0)
MCH: 30.4 pg (ref 26.0–34.0)
MCHC: 33.5 g/dL (ref 30.0–36.0)
MCV: 90.8 fL (ref 80.0–100.0)
Platelets: 414 10*3/uL — ABNORMAL HIGH (ref 150–400)
RBC: 4.67 MIL/uL (ref 3.87–5.11)
RDW: 13 % (ref 11.5–15.5)
WBC: 10.9 10*3/uL — ABNORMAL HIGH (ref 4.0–10.5)
nRBC: 0 % (ref 0.0–0.2)

## 2021-07-08 LAB — PROTIME-INR
INR: 0.9 (ref 0.8–1.2)
Prothrombin Time: 12.2 seconds (ref 11.4–15.2)

## 2021-07-08 LAB — ETHANOL: Alcohol, Ethyl (B): <10 mg/dL (ref <10)

## 2021-07-08 LAB — CBG MONITORING, ED: Glucose-Capillary: 155 mg/dL — ABNORMAL HIGH (ref 70–99)

## 2021-07-08 LAB — APTT: aPTT: 23 seconds — ABNORMAL LOW (ref 24–36)

## 2021-07-08 IMAGING — MR MR HEAD W/O CM
10 of 11 series · 43 of 48 positions shown · non-contrast
Comparison: Prior CTs from earlier the same day as well as previous
MRI from [DATE]

CLINICAL DATA: Initial evaluation for acute neuro deficit, stroke
suspected.

EXAM:
MRI HEAD WITHOUT CONTRAST
TECHNIQUE: Multiplanar, multiecho pulse sequences of the brain and surrounding
structures were obtained without intravenous contrast.

[Series 5: DWI · axial · 3.0mm · 0.88mm/px · z∈[-82,+64]mm · 10 of 100 slices shown (1 of 4)]
[im 1/100]
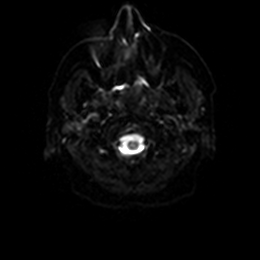
[im 12/100]
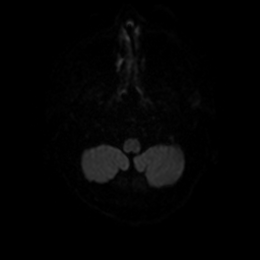
[im 23/100]
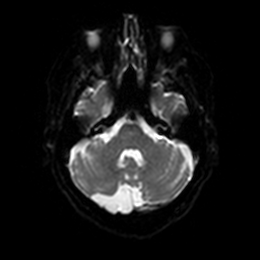
[im 34/100]
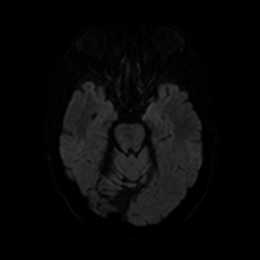
[im 45/100]
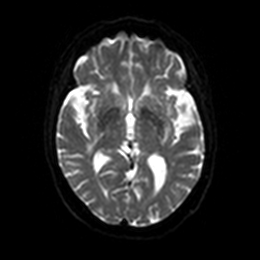
[im 56/100]
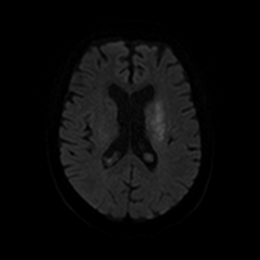
[im 67/100]
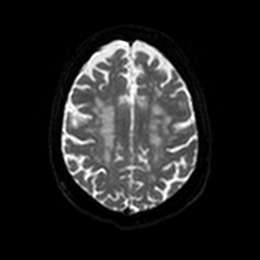
[im 78/100]
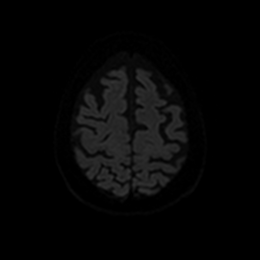
[im 89/100]
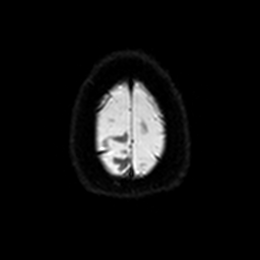
[im 100/100]
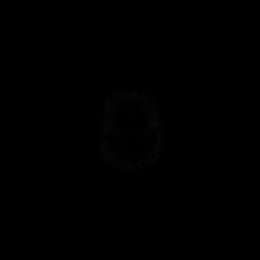

[Series 6: DWI · axial · 3.0mm · 0.88mm/px · z∈[-82,+64]mm · 5 of 50 slices shown (2 of 4)]
[im 1/50]
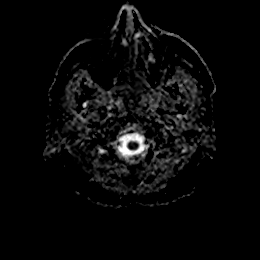
[im 13/50]
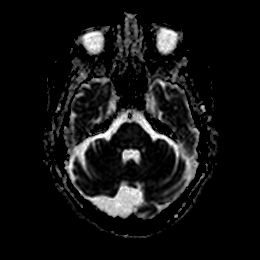
[im 25/50]
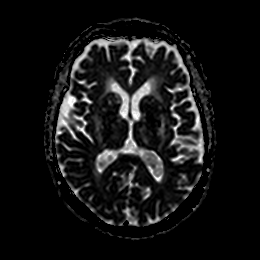
[im 37/50]
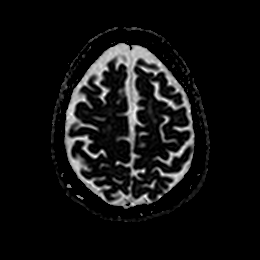
[im 50/50]
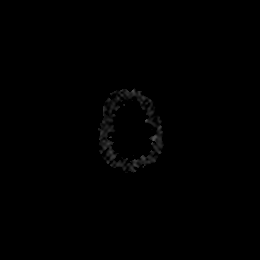

[Series 7: DWI · coronal · 4.0mm · 0.88mm/px · 6 of 66 slices shown (3 of 4)]
[im 1/66]
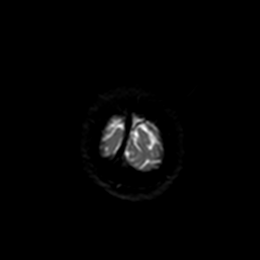
[im 14/66]
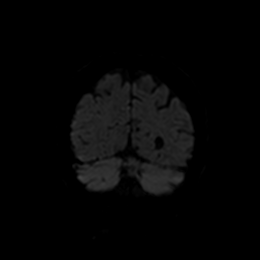
[im 27/66]
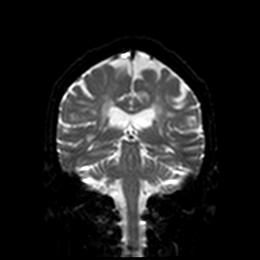
[im 40/66]
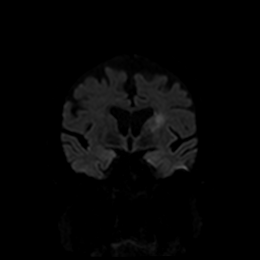
[im 53/66]
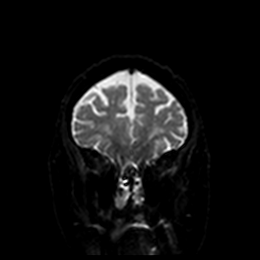
[im 66/66]
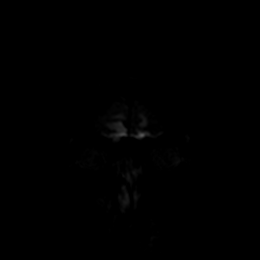

[Series 8: DWI · coronal · 4.0mm · 0.88mm/px · 3 of 33 slices shown (4 of 4)]
[im 1/33]
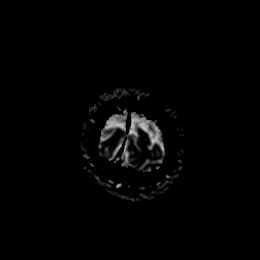
[im 17/33]
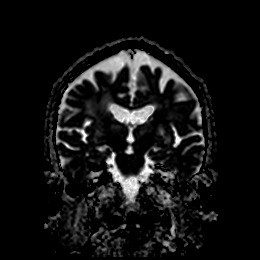
[im 33/33]
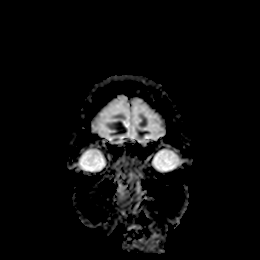

[Series 9: T1 · sagittal · 5.0mm · 0.75mm/px · 2 of 23 slices shown]
[im 1/23]
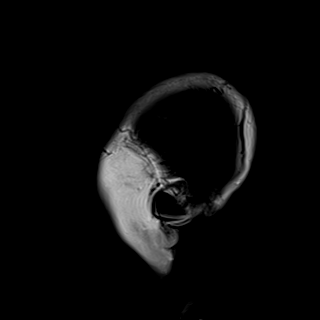
[im 23/23]
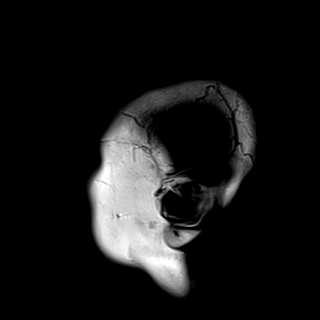

[Series 10: T2 · axial · 5.0mm · 0.72mm/px · z∈[-81,+63]mm · 2 of 25 slices shown (1 of 2)]
[im 1/25]
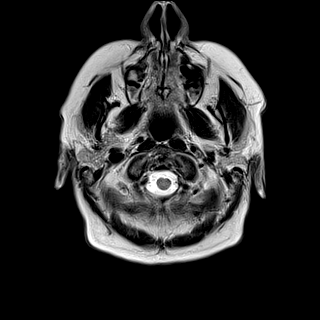
[im 25/25]
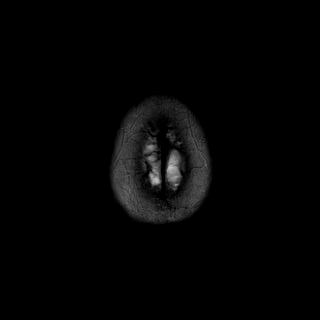

[Series 11: FLAIR · axial · 5.0mm · 0.45mm/px · z∈[-79,+64]mm · 2 of 25 slices shown]
[im 1/25]
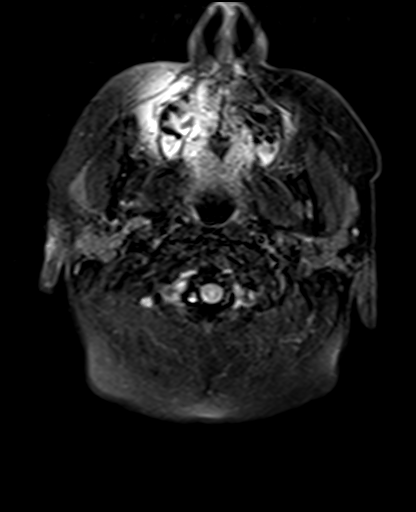
[im 25/25]
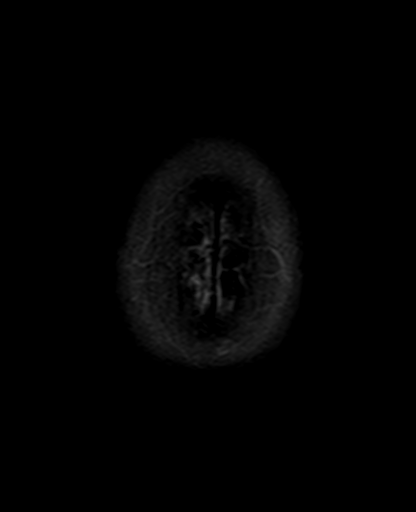

[Series 13: pha_images · axial · 3.0mm · 0.90mm/px · z∈[-93,+81]mm · 5 of 57 slices shown]
[im 1/57]
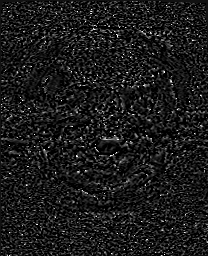
[im 15/57]
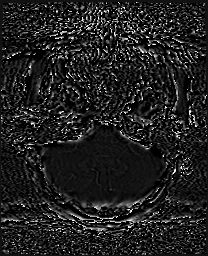
[im 29/57]
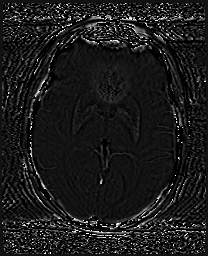
[im 43/57]
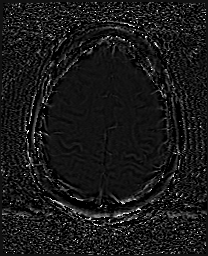
[im 57/57]
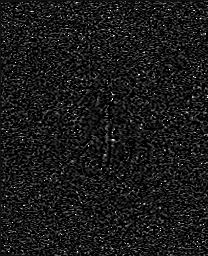

[Series 14: swi_images · axial · 3.0mm · 0.90mm/px · z∈[-96,+81]mm · 5 of 60 slices shown]
[im 1/60]
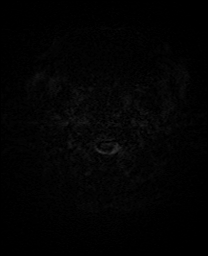
[im 15/60]
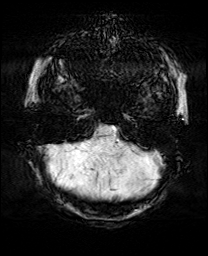
[im 30/60]
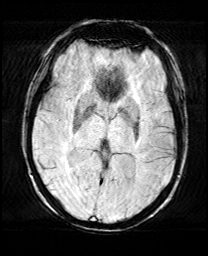
[im 45/60]
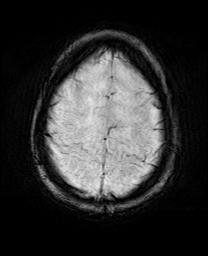
[im 60/60]
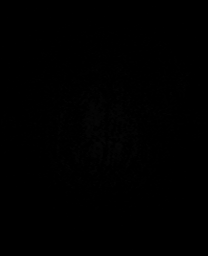

[Series 17: T2 · coronal · 5.0mm · 0.34mm/px · 3 of 29 slices shown (2 of 2)]
[im 1/29]
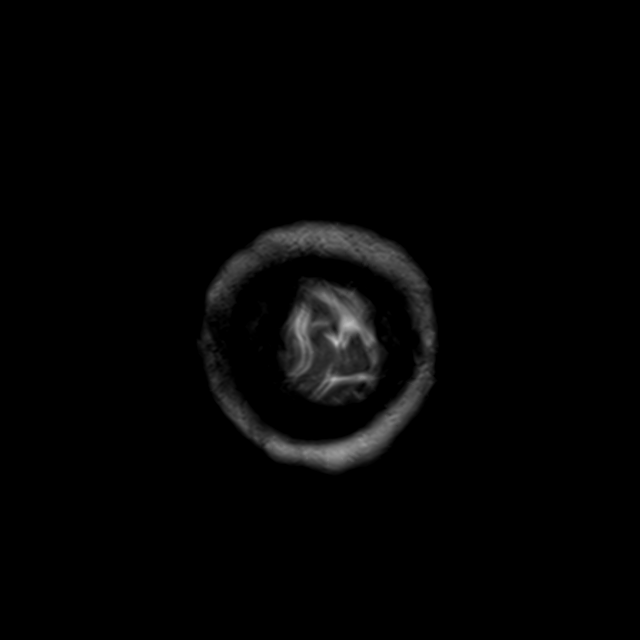
[im 15/29]
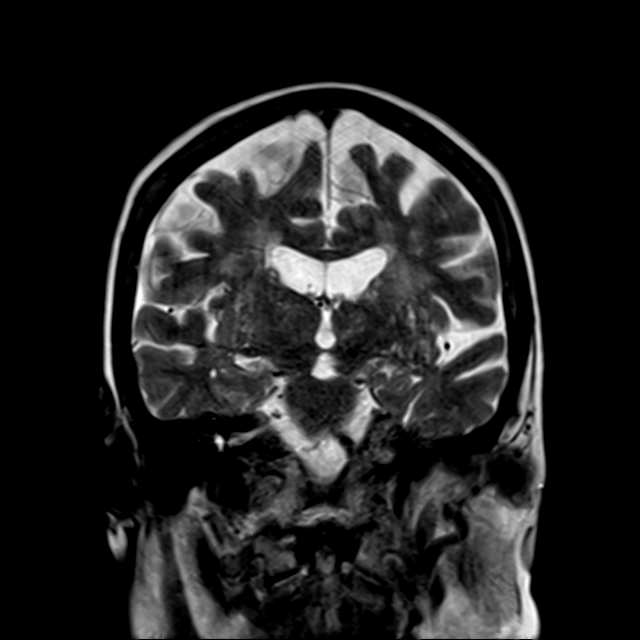
[im 29/29]
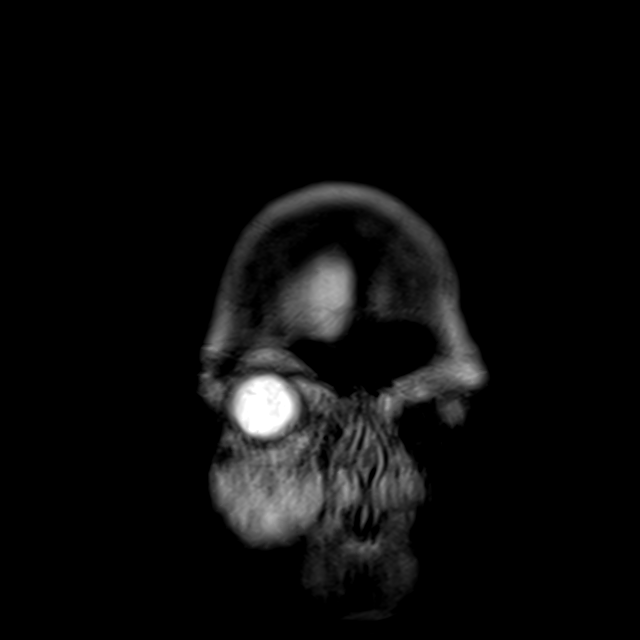

[43 of 48 positions shown; findings below may reference images not displayed]

FINDINGS: Brain: Examination mildly degraded by motion artifact. Diffuse
prominence of the CSF containing spaces compatible generalized
cerebral atrophy. Patchy and confluent T2/FLAIR hyperintensity
involving the periventricular and deep white matter both cerebral
hemispheres most consistent with chronic microvascular ischemic
disease, moderately advanced in nature. Patchy involvement of the
pons.

There has been interval worsening and/or expansion of previously
identified infarct involving the left basal ganglia/corona radiata,
increased in size from prior now measuring up to 3.6 cm (series 5,
image 39). Some persistent signal loss seen on corresponding ADC
map, consistent with acute to early subacute ischemia. No associated
hemorrhage or significant regional mass effect.

Otherwise, no other new areas of acute ischemia are seen. Gray-white
matter differentiation otherwise maintained. No other areas of
chronic cortical infarction. No evidence for acute or chronic
intracranial hemorrhage.

No mass lesion, mass effect, or midline shift. Ventricles stable in
size without hydrocephalus. No extra-axial fluid collection.
Pituitary gland suprasellar region normal. Midline structures
intact.

Vascular: Major intracranial vascular flow voids are maintained.

Skull and upper cervical spine: Craniocervical junction within
normal limits. Visualized upper cervical spine normal. Bone marrow
signal intensity within normal limits. No scalp soft tissue
abnormality.

Sinuses/Orbits: Patient status post bilateral ocular lens
replacement. Globes orbital soft tissues demonstrate no acute
finding. Paranasal sinuses are largely clear. No significant mastoid
effusion. Inner ear structures grossly normal.

Other: None.
IMPRESSION: 1. Interval expansion of previously identified left basal
ganglia/corona radiata infarct, now measuring up to 3.6 cm. No
associated hemorrhage or significant regional mass effect.
2. No other acute intracranial abnormality.
3. Underlying age-related cerebral atrophy with moderate chronic
microvascular ischemic disease.

## 2021-07-08 IMAGING — CT CT HEAD CODE STROKE
3 of 4 series · 15 of 47 positions shown, 18 images · non-contrast
Comparison: CT head [DATE]

CLINICAL DATA: Code stroke.  Acute neuro deficit.  Aphasia

EXAM:
CT HEAD WITHOUT CONTRAST
TECHNIQUE: Contiguous axial images were obtained from the base of the skull
through the vertex without intravenous contrast.

[Series 3: head 5.0 st · axial · 0.46mm/px · z∈[-74,+56]mm · 9 of 32 slices shown, 12 images]
[im 3/32  brain]
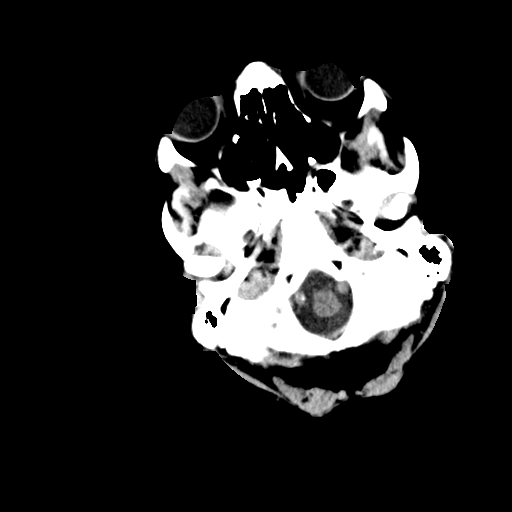
[im 3/32  bone]
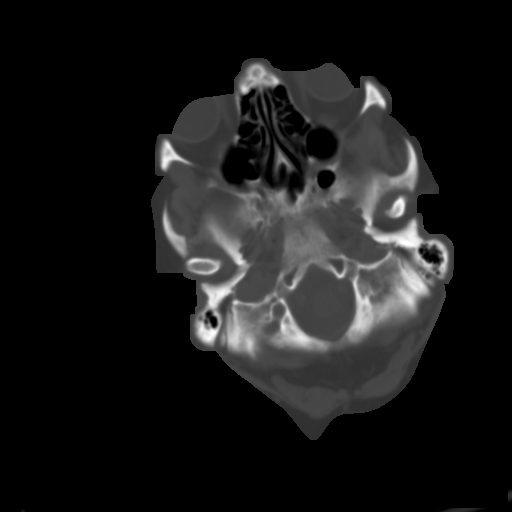
[im 7/32  brain]
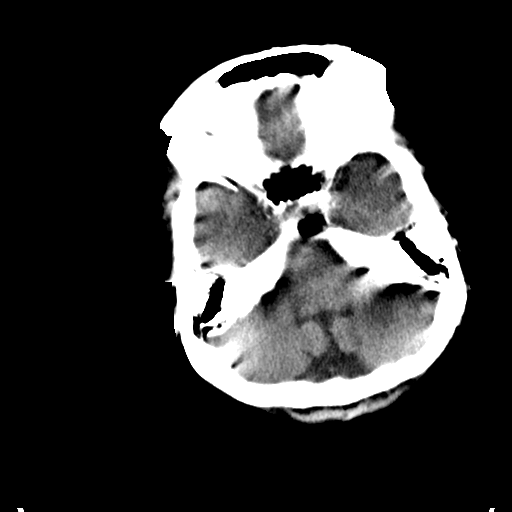
[im 9/32  brain]
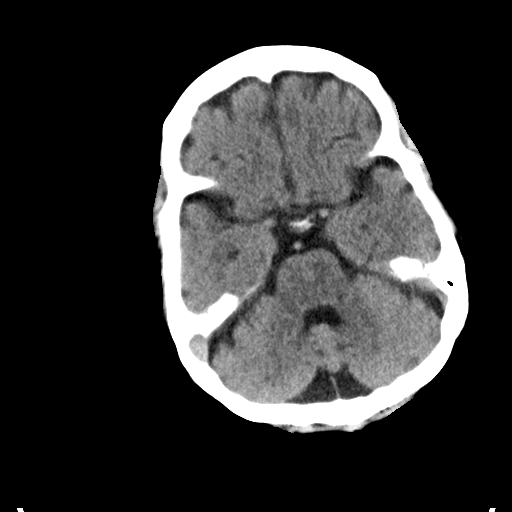
[im 14/32  brain]
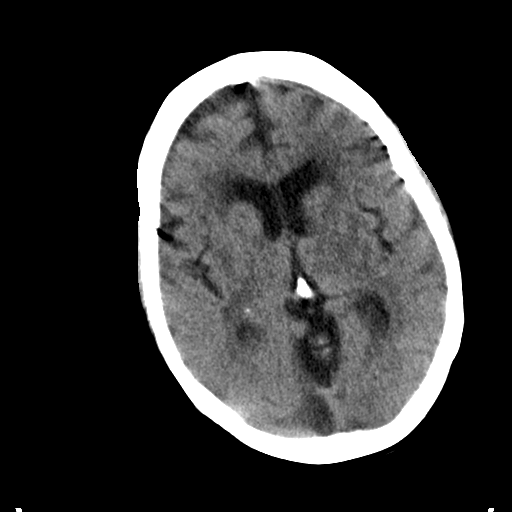
[im 16/32  brain]
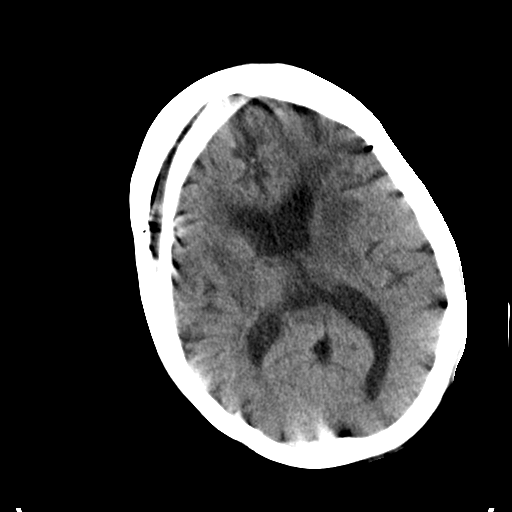
[im 16/32  bone]
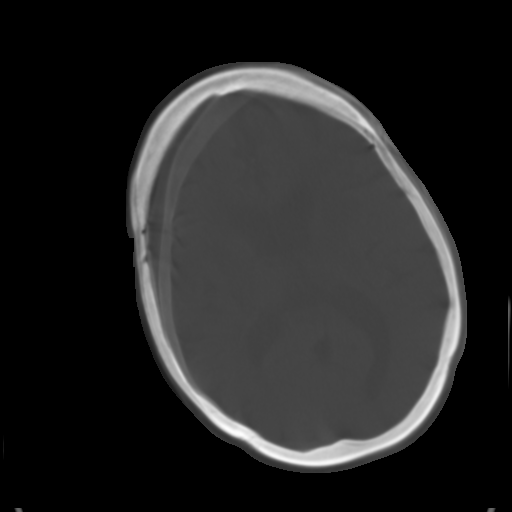
[im 18/32  brain]
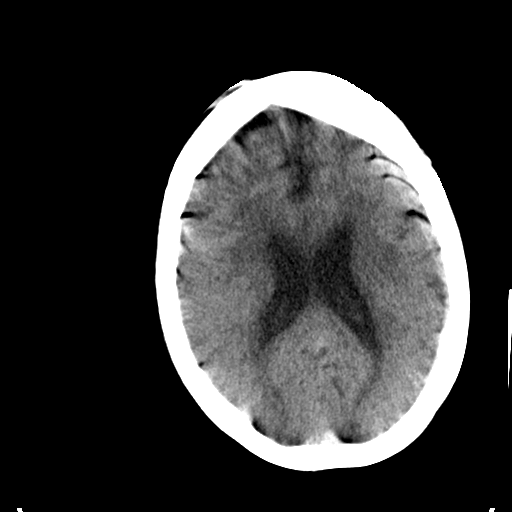
[im 23/32  brain]
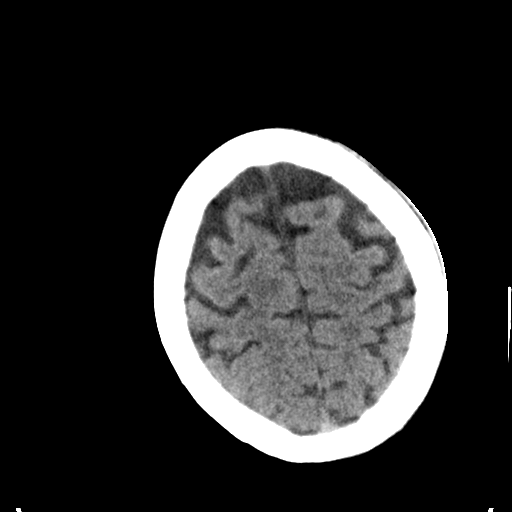
[im 25/32  brain]
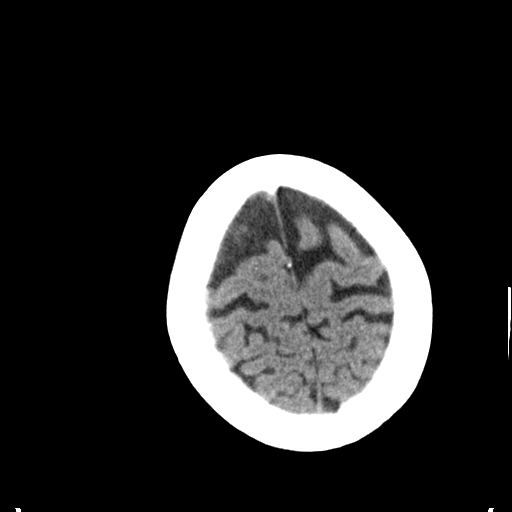
[im 29/32  brain]
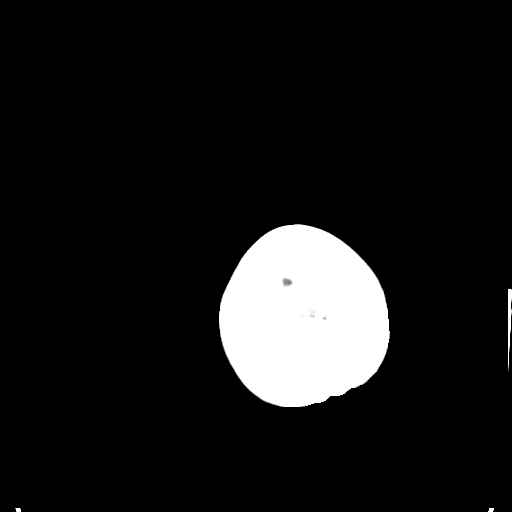
[im 29/32  bone]
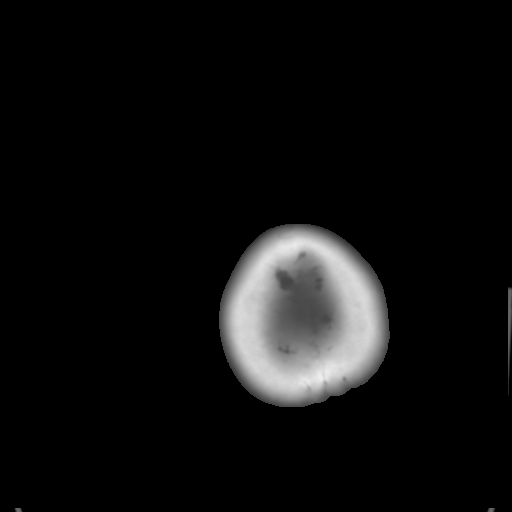

[Series 7: head 3.0 cor st · coronal · 0.31mm/px · 3 of 69 slices shown]
[im 23/69  brain]
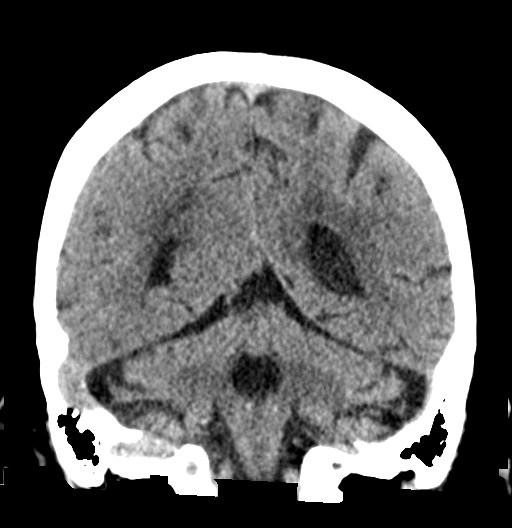
[im 31/69  brain]
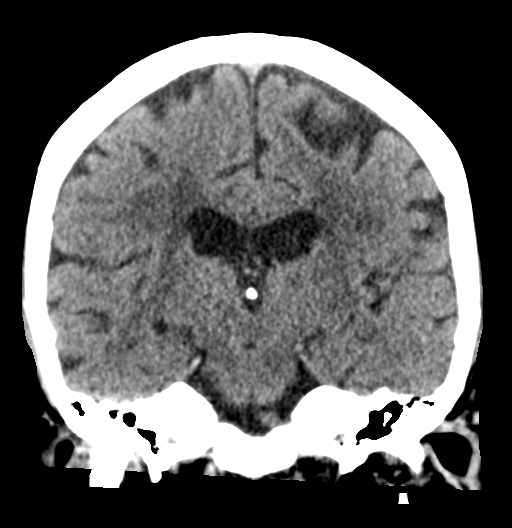
[im 38/69  brain]
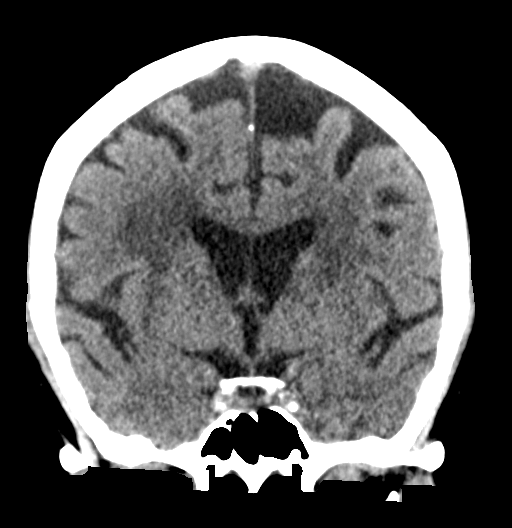

[Series 8: head 3.0 sag st · sagittal · 0.32mm/px · 3 of 55 slices shown]
[im 19/55  brain]
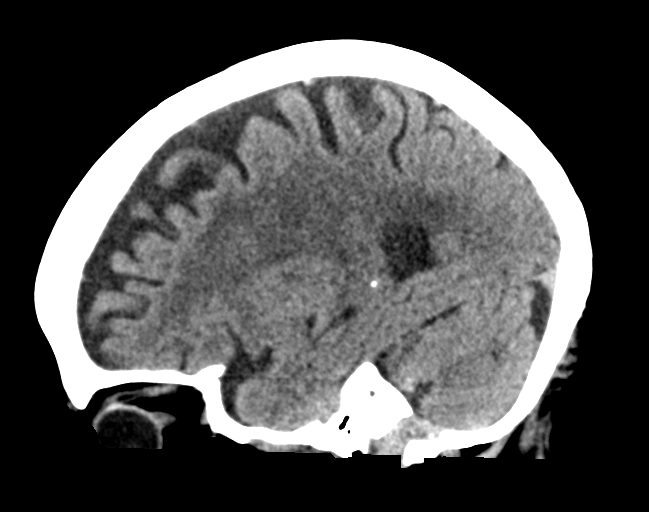
[im 28/55  brain]
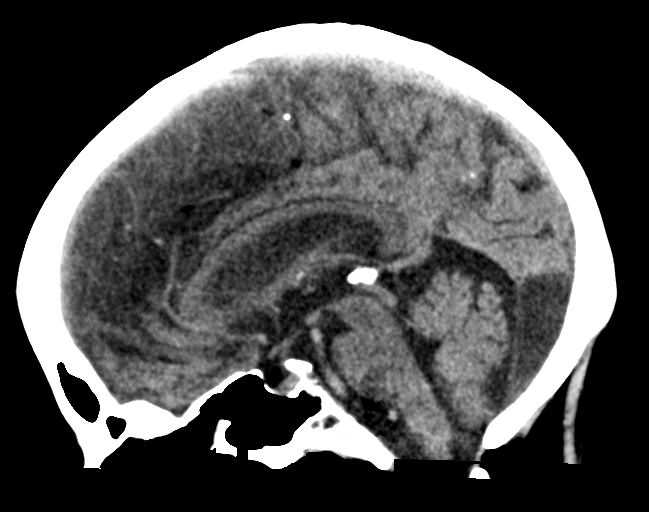
[im 37/55  brain]
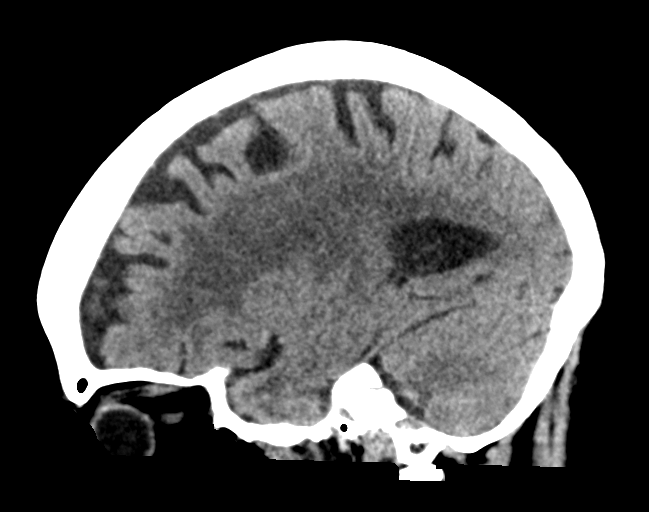

[15 of 47 positions shown; findings below may reference images not displayed]

FINDINGS: Brain: Generalized atrophy. Chronic white matter hypodensity
diffusely extending into the basal ganglia bilaterally. Small
hypodensity left thalamus unchanged.

Negative for acute infarct, hemorrhage, mass.

Vascular: Negative for hyperdense vessel

Skull: Negative

Sinuses/Orbits: Paranasal sinuses clear. Bilateral cataract
extraction

Other: None

ASPECTS (Alberta Stroke Program Early CT Score)

- Ganglionic level infarction (caudate, lentiform nuclei, internal
capsule, insula, M1-M3 cortex): 7

- Supraganglionic infarction (M4-M6 cortex): 3

Total score (0-10 with 10 being normal): 10
IMPRESSION: 1. No acute abnormality.
2. Atrophy and extensive chronic microvascular ischemic changes.
3. ASPECTS is 10

## 2021-07-08 IMAGING — CT CT ANGIO HEAD
2 of 8 series · 9 of 33 positions shown · IV contrast (OMNI 350)
Comparison: CT head [DATE]

CLINICAL DATA: Acute neuro deficit.  Aphasia.

EXAM:
CT ANGIOGRAPHY HEAD AND NECK
TECHNIQUE: Multidetector CT imaging of the head and neck was performed using
the standard protocol during bolus administration of intravenous
contrast. Multiplanar CT image reconstructions and MIPs were
obtained to evaluate the vascular anatomy. Carotid stenosis
measurements (when applicable) are obtained utilizing NASCET
criteria, using the distal internal carotid diameter as the
denominator.
CONTRAST:  75mL OMNIPAQUE IOHEXOL 350 MG/ML SOLN

[Series 7: cta neck axial · axial · 0.39mm/px · z∈[-309,+41]mm · 3 of 349 slices shown]
[im 1/349  soft-tissue]
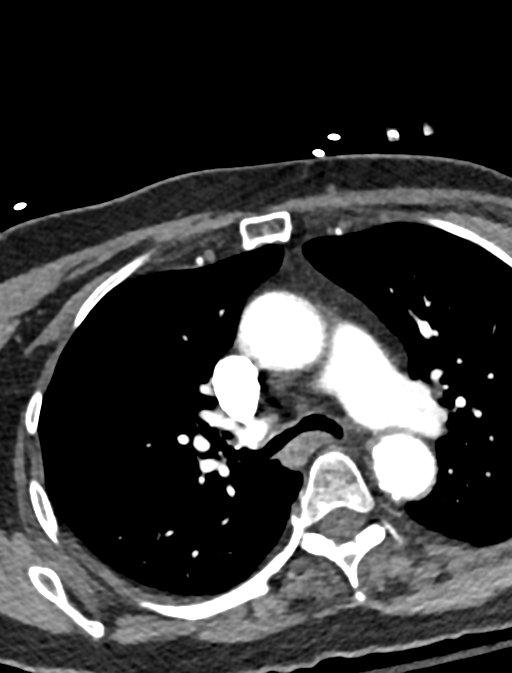
[im 175/349  bone]
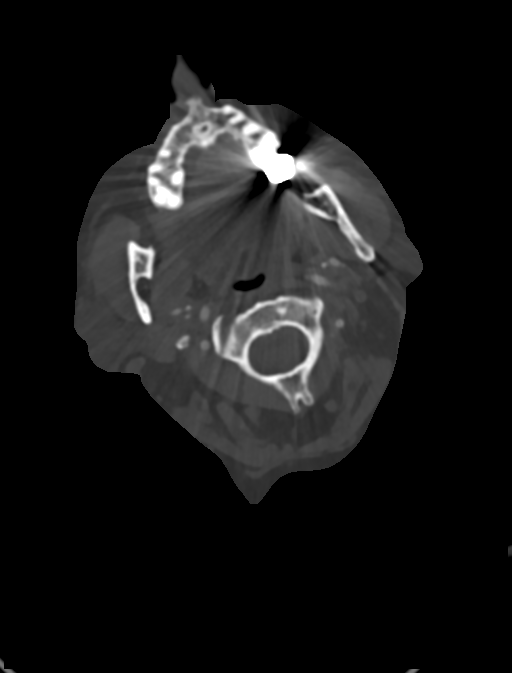
[im 349/349  soft-tissue]
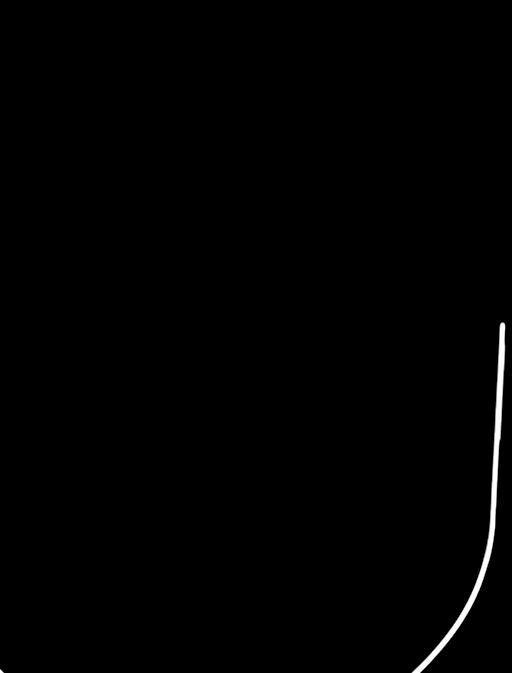

[Series 13: cta neck thins · axial · 0.52mm/px · z∈[-237,-8]mm · 6 of 804 slices shown]
[im 115/804  soft-tissue]
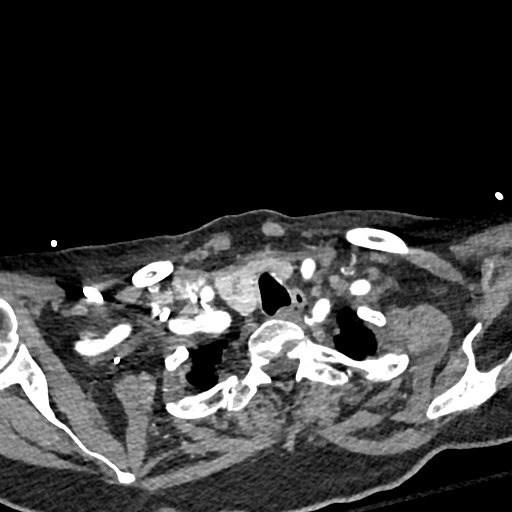
[im 230/804  soft-tissue]
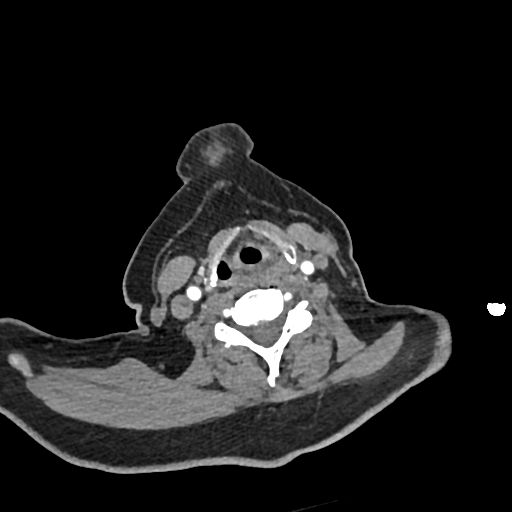
[im 345/804  soft-tissue]
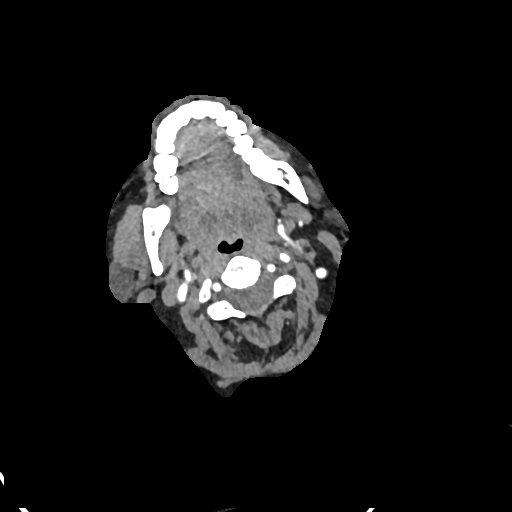
[im 459/804  soft-tissue]
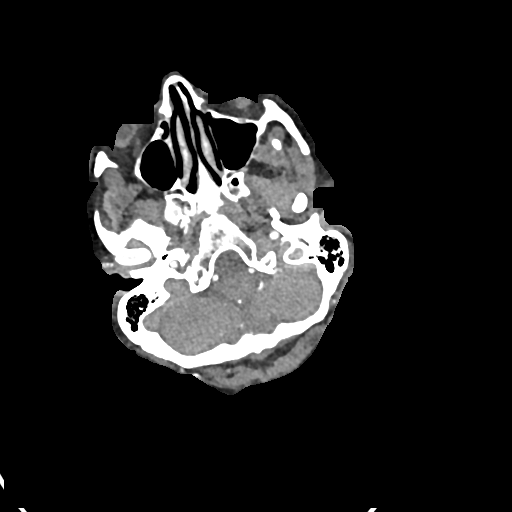
[im 574/804  soft-tissue]
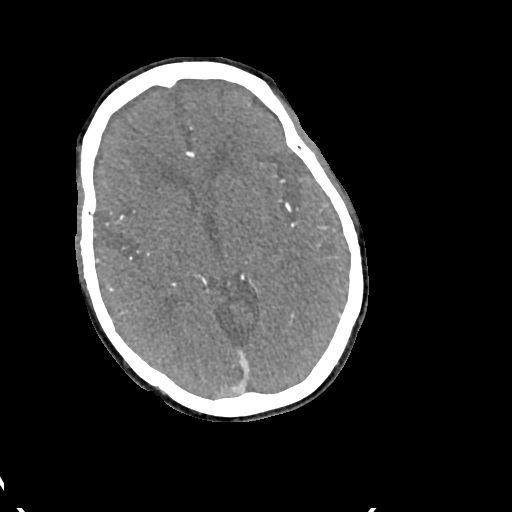
[im 689/804  soft-tissue]
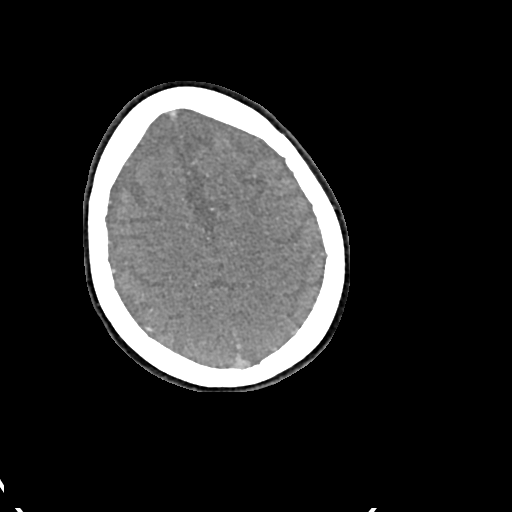

[9 of 33 positions shown; findings below may reference images not displayed]

FINDINGS: CTA NECK FINDINGS

Aortic arch: Atherosclerotic calcification aortic arch. Proximal
great vessels widely patent.

Right carotid system: Mild atherosclerotic disease right carotid. No
significant stenosis.

Left carotid system: Mild atherosclerotic disease left carotid.
Negative for stenosis

Vertebral arteries: Both vertebral arteries widely patent to the
skull base without stenosis.

Skeleton: No acute abnormality.

Other neck: Mild enlargement of the right lobe of the thyroid.
Subcentimeter nodule right lower pole. No further imaging required
(ref: [HOSPITAL]. [DATE]): 143-50).

Upper chest: Lung apices clear bilaterally.

Review of the MIP images confirms the above findings

CTA HEAD FINDINGS

Anterior circulation: Cavernous carotid widely patent with mild
atherosclerotic disease. Mild intracranial atherosclerotic disease
with vessel irregularity in the anterior and middle cerebral
arteries bilaterally. No significant stenosis.

Posterior circulation: Both vertebral arteries patent to the
basilar. Basilar widely patent. Posterior cerebral arteries patent
bilaterally with mild atherosclerotic irregularity. Negative for
large vessel occlusion

Venous sinuses: Minimal venous enhancement

Anatomic variants: None

Review of the MIP images confirms the above findings
IMPRESSION: 1. Negative for intracranial large vessel occlusion
2. Intracranial atherosclerotic disease with mild irregularity of
the anterior, middle, and posterior cerebral arteries bilaterally.
3. Minimal atherosclerotic disease in the carotid arteries. No
carotid or vertebral artery stenosis in the neck.
4. Code stroke imaging results were communicated on [DATE] at
[DATE] to provider [facility] via text page

## 2021-07-08 MED ORDER — ROSUVASTATIN CALCIUM 5 MG PO TABS
10.0000 mg | ORAL_TABLET | Freq: Every day | ORAL | Status: DC
  Administered 2021-07-09 – 2021-07-10 (×2): 10 mg via ORAL
  Filled 2021-07-08 (×2): qty 2

## 2021-07-08 MED ORDER — ACETAMINOPHEN 325 MG PO TABS: 650.0000 mg | ORAL_TABLET | Freq: Four times a day (QID) | ORAL | Status: DC | PRN

## 2021-07-08 MED ORDER — ASPIRIN EC 81 MG PO TBEC
81.0000 mg | DELAYED_RELEASE_TABLET | Freq: Every day | ORAL | Status: DC
  Administered 2021-07-09 – 2021-07-10 (×2): 81 mg via ORAL
  Filled 2021-07-08 (×2): qty 1

## 2021-07-08 MED ORDER — SODIUM CHLORIDE 0.9 % IV SOLN: INTRAVENOUS | Status: DC

## 2021-07-08 MED ORDER — IOHEXOL 350 MG/ML SOLN
75.0000 mL | Freq: Once | INTRAVENOUS | Status: AC | PRN
  Administered 2021-07-08: 75 mL via INTRAVENOUS

## 2021-07-08 MED ORDER — BRINZOLAMIDE 1 % OP SUSP
1.0000 [drp] | Freq: Two times a day (BID) | OPHTHALMIC | Status: DC
  Administered 2021-07-10 (×2): 1 [drp] via OPHTHALMIC
  Filled 2021-07-08 (×2): qty 10

## 2021-07-08 MED ORDER — ACETAMINOPHEN 650 MG RE SUPP: 650.0000 mg | Freq: Four times a day (QID) | RECTAL | Status: DC | PRN

## 2021-07-08 MED ORDER — ENOXAPARIN SODIUM 40 MG/0.4ML IJ SOSY
40.0000 mg | PREFILLED_SYRINGE | INTRAMUSCULAR | Status: DC
  Administered 2021-07-08 – 2021-07-09 (×2): 40 mg via SUBCUTANEOUS
  Filled 2021-07-08 (×2): qty 0.4

## 2021-07-08 MED ORDER — TIMOLOL MALEATE 0.5 % OP SOLN
1.0000 [drp] | Freq: Two times a day (BID) | OPHTHALMIC | Status: DC
  Administered 2021-07-09: 1 [drp] via OPHTHALMIC
  Filled 2021-07-08 (×2): qty 5

## 2021-07-08 NOTE — Consult Note (Signed)
NEUROLOGY CONSULTATION NOTE   Date of service: July 08, 2021 Patient Name: Sandra Becker MRN:  [id number] DOB:  01-Oct-1940 Reason for consult: "aphasia" Requesting Provider: Shela Leff, MD _ _ _   _ __   _ __ _ _  __ __   _ __   __ _  History of Present Illness  Sandra Becker is a 80 y.o. female with PMH significant for hypertension, recent TIA, history of glaucoma, remote history of breast cancer who presents with aphasia.  Was seen on 06/24/21 with  with right upper extremity weakness and difficulty word finding and slurring of her speech.  Her symptoms spontaneously resolved after about 40 minutes. 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  Per husband, she was standing up and putting in her hearing aids when she suddenly had a decrease in responsiveness and her walker prevented her from falling down. She quickly regained consciousness but was off and had a lot of trouble with her words. He called EMS and she was brought in and was still noted to have significant aphasia and could not say her name correctly. A code stroke was activated in the ED.  mRS: 1 tNKase: not offered 2/2 stroke on her MRI Brain from 06/25/21. Thrombectomy: Not offered 2/2 no LVO. Aphasia improved significantly in the scanner and she was back to her baseline about 20 mins later.  NIHSS components Score: Comment  1a Level of Conscious 0[x]  1[]  2[]  3[]      1b LOC Questions 0[x]  1[]  2[]       1c LOC Commands 0[x]  1[]  2[]       2 Best Gaze 0[x]  1[]  2[]       3 Visual 0[x]  1[]  2[]  3[]      4 Facial Palsy 0[x]  1[]  2[]  3[]      5a Motor Arm - left 0[x]  1[]  2[]  3[]  4[]  UN[]    5b Motor Arm - Right 0[x]  1[]  2[]  3[]  4[]  UN[]    6a Motor Leg - Left 0[x]  1[]  2[]  3[]  4[]  UN[]    6b Motor Leg - Right 0[x]  1[]  2[]  3[]  4[]  UN[]    7 Limb Ataxia 0[x]  1[]  2[]  3[]  UN[]     8 Sensory 0[x]  1[]  2[]  UN[]      9 Best Language 0[]  1[]  2[x]  3[]      10 Dysarthria 0[x]  1[]  2[]  UN[]      11 Extinct. and  Inattention 0[x]  1[]  2[]       TOTAL: 2     ROS   Constitutional Denies weight loss, fever and chills.   HEENT Denies changes in vision and hearing.   Respiratory Denies SOB and cough.   CV Denies palpitations and CP   GI Denies abdominal pain, nausea, vomiting and diarrhea.   GU Denies dysuria and urinary frequency.   MSK Denies myalgia and joint pain.   Skin Denies rash and pruritus.   Neurological Denies headache and syncope.   Psychiatric Denies recent changes in mood. Denies anxiety and depression.    Past History   Past Medical History:  Diagnosis Date   Breast cancer ([place]) 1993   bilateral mastectomy; no chemo or radiation   Glaucoma    Hyperlipidemia    Hypertension    Past Surgical History:  Procedure Laterality Date   MASTECTOMY Bilateral 1993   TONSILLECTOMY AND ADENOIDECTOMY  1947   TUBAL LIGATION  1974   Family History  Problem Relation Age of Onset   Colon cancer Neg Hx    Social History  Socioeconomic History   Marital status: Married    Spouse name: Not on file   Number of children: Not on file   Years of education: Not on file   Highest education level: Not on file  Occupational History   Not on file  Tobacco Use   Smoking status: Former    Types: Cigarettes    Quit date: 07/30/1963    Years since quitting: 57.9   Smokeless tobacco: Never  Substance and Sexual Activity   Alcohol use: Yes    Alcohol/week: 21.0 standard drinks    Types: 21 Glasses of wine per week   Drug use: No   Sexual activity: Not on file  Other Topics Concern   Not on file  Social History Narrative   Not on file   Social Determinants of Health   Financial Resource Strain: Not on file  Food Insecurity: Not on file  Transportation Needs: Not on file  Physical Activity: Not on file  Stress: Not on file  Social Connections: Not on file   Allergies  Allergen Reactions   Crab [Shellfish Allergy] Other (See Comments)    From skin test   Fosamax [Alendronate  Sodium] Other (See Comments)    Difficult swallowing   Hydrochlorothiazide Other (See Comments)    hyponatremia   Losartan Cough   Raloxifene Other (See Comments)    Unknown reaction   Ace Inhibitors Cough    Reaction to losartan (pt tolerates valsartan)    Medications  (Not in a hospital admission)    Vitals   Vitals:   07/08/21 1745 07/08/21 1800 07/08/21 1815 07/08/21 1915  BP: (!) 152/71 (!) 149/71 (!) 162/81 (!) 161/90  Pulse: 86 84 80 95  Resp: 14 17 18 13   Temp:      TempSrc:      SpO2: 96% 96% 98% 94%  Weight:      Height:         Body mass index is 28.69 kg/m.  Physical Exam   General: Laying comfortably in bed; in no acute distress.  HENT: Normal oropharynx and mucosa. Normal external appearance of ears and nose.  Neck: Supple, no pain or tenderness  CV: No JVD. No peripheral edema.  Pulmonary: Symmetric Chest rise. Normal respiratory effort.  Abdomen: Soft to touch, non-tender.  Ext: No cyanosis, edema, or deformity  Skin: No rash. Normal palpation of skin.   Musculoskeletal: Normal digits and nails by inspection. No clubbing.   Neurologic Examination  Mental status/Cognition: Alert, oriented to self, place, month and year, good attention.  Speech/language: Non fluent, gets stuck on a few words. Comprehension intact, object naming intact, repetition intact.  Cranial nerves:   CN II Pupils equal and reactive to light, no VF deficits    CN III,IV,VI EOM intact, no gaze preference or deviation, no nystagmus    CN V normal sensation in V1, V2, and V3 segments bilaterally    CN VII no asymmetry, no nasolabial fold flattening    CN VIII normal hearing to speech    CN IX & X normal palatal elevation, no uvular deviation    CN XI 5/5 head turn and 5/5 shoulder shrug bilaterally    CN XII midline tongue protrusion    Motor:  Muscle bulk: normal, tone normal Mvmt Root Nerve  Muscle Right Left Comments  SA C5/6 Ax Deltoid     EF C5/6 Mc Biceps 5 5   EE  C6/7/8 Rad Triceps 5 5   WF C6/7 Med  FCR     WE C7/8 PIN ECU     F Ab C8/T1 U ADM/FDI 5 5   HF L1/2/3 Fem Illopsoas 5 5   KE L2/3/4 Fem Quad     DF L4/5 D Peron Tib Ant 5 5   PF S1/2 Tibial Grc/Sol 5 5    Reflexes:  Right Left Comments  Pectoralis      Biceps (C5/6) 2 2   Brachioradialis (C5/6) 2 2    Triceps (C6/7) 2 2    Patellar (L3/4) 2 2    Achilles (S1)      Hoffman      Plantar     Jaw jerk    Sensation:  Light touch Intact throughout   Pin prick    Temperature    Vibration   Proprioception    Coordination/Complex Motor:  - Finger to Nose intact BL - Heel to shin intact BL - Rapid alternating movement are normal - Gait: Deferred.  Labs   CBC:  Recent Labs  Lab 07/08/21 1815 07/08/21 1821  WBC 10.9*  --   NEUTROABS 7.7  --   HGB 14.2 15.3*  HCT 42.4 45.0  MCV 90.8  --   PLT 414*  --     Basic Metabolic Panel:  Lab Results  Component Value Date   NA 129 (L) 07/08/2021   K 3.6 07/08/2021   CO2 22 07/08/2021   GLUCOSE 146 (H) 07/08/2021   BUN 16 07/08/2021   CREATININE 0.70 07/08/2021   CALCIUM 9.6 07/08/2021   GFRNONAA >60 07/08/2021   GFRAA >60 09/25/2018   Lipid Panel:  Lab Results  Component Value Date   LDLCALC 75 06/25/2021   HgbA1c:  Lab Results  Component Value Date   HGBA1C 5.7 (H) 06/25/2021   Urine Drug Screen:     Component Value Date/Time   LABOPIA NONE DETECTED 05/03/2021 1423   COCAINSCRNUR NONE DETECTED 05/03/2021 1423   LABBENZ NONE DETECTED 05/03/2021 1423   AMPHETMU NONE DETECTED 05/03/2021 1423   THCU NONE DETECTED 05/03/2021 1423   LABBARB NONE DETECTED 05/03/2021 1423    Alcohol Level     Component Value Date/Time   ETH <10 07/08/2021 1815    CT Head without contrast(Personally reviewed): CTH was negative for a large hypodensity concerning for a large territory infarct or hyperdensity concerning for an [place]  CT angio Head and Neck with contrast(Personally reviewed): No LVO  MRI  Brain(Pending):  cEEG:  pending  Impression   Sandra Becker is a 80 y.o. female with PMH significant for hypertension, recent TIA, history of glaucoma, remote history of breast cancer who presents with aphasia. This is her third episode of aphasia. Her symptoms resolved again and she was back to her baseline within an hour and a half.  The fact that she had a recurrent episode that is pretty much identical to her prior episode, makes me think of potential seizures.  However, symptoms could also potentially localize to left MCA distribution which is the most common territory for potential embolic strokes.  Will get TIA workup along with a cEEG this time.  Recommendations  Plan:  - Frequent Neuro checks per stroke unit protocol - Recommend brain imaging with MRI Brain without contrast - Recommend obtaining TTE - Recommend obtaining Lipid panel with LDL - Please start statin if LDL > 70 - Recommend HbA1c - Antithrombotic - Aspirin 81mg  daily. - Recommend DVT ppx - SBP goal - permissive hypertension first 24 h < 220/110. Held  home meds.  - Recommend Telemetry monitoring for arrythmia - Recommend bedside swallow screen prior to PO intake. - Stroke education booklet - Recommend PT/OT/SLP consult - cEEG.  Plan discussed with Dr. Alvino Chapel over secure chat. ______________________________________________________________________   Thank you for the opportunity to take part in the care of this patient. If you have any further questions, please contact the neurology consultation attending.  Signed,  [place] Pager Number [phone] _ _ _   _ __   _ __ _ _  __ __   _ __   __ _

## 2021-07-08 NOTE — ED Notes (Signed)
Patient transported to MRI 

## 2021-07-08 NOTE — ED Triage Notes (Signed)
Per [facility] pt coming from [facility] [place] [facility]. EMS called by husband after finding her standing up by the dresser slumped over trying to get her hearing aids in. States she began to fall and her walker helped catch her. Denies hitting head. Pt just discharged from hospital after having a stroke. Patient having aphasia that has been going on since recent stroke. Husband at bedside.

## 2021-07-08 NOTE — Code Documentation (Signed)
Stroke Response Nurse Documentation Code Documentation  Sandra Becker is a 80 y.o. female arriving to [facility] ED via [facility] on 07/08/21 with past medical hx of stroke, HLD, HTN, and history of breast cancer. On aspirin 81 mg daily. Code stroke was activated by EMS    Patient from [place] where she was LKW at 1700 and now complaining of aphasia .   Stroke team at the bedside on patient arrival. Labs drawn and patient cleared for CT by Dr. Alvino Chapel. Patient to CT with team. NIHSS 2, see documentation for details and code stroke times. Patient with Global aphasia  on exam. The following imaging was completed:  CT, CTA. Patient is a candidate for IV Thrombolytic due to recent stroke activity.     Bedside handoff with ED RN Judson Roch.    [place]  Stroke Response RN

## 2021-07-08 NOTE — ED Provider Notes (Signed)
[facility] EMERGENCY DEPARTMENT Provider Note   CSN: [id number] Arrival date & time: 07/08/21  1726     History Chief Complaint  Patient presents with   Altered Mental Status    Sandra Becker is a 80 y.o. female.  The history is provided by the patient, medical records and the spouse.  Altered Mental Status Presenting symptoms comment:  Abrupt worsening of expressive aphasia and R sided weakness Severity:  Severe Most recent episode:  Today Episode history:  Single Duration:  1 hour Timing:  Constant Progression:  Unchanged Chronicity:  New Context comment:  Recent admission for CVA Associated symptoms: weakness   Associated symptoms: no abdominal pain, no fever, no palpitations, no rash, no seizures and no vomiting       Past Medical History:  Diagnosis Date   Breast cancer ([place]) 1993   bilateral mastectomy; no chemo or radiation   Glaucoma    Hyperlipidemia    Hypertension     Patient Active Problem List   Diagnosis Date Noted   Stroke-like symptoms 07/08/2021   Benign essential HTN    Dyslipidemia    Prediabetes 06/27/2021   Hyperlipidemia, unspecified 06/27/2021   Stroke ([place]) 06/26/2021   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     Family History  Problem Relation Age of Onset   Colon cancer Neg Hx     Social History   Tobacco Use   Smoking status: Former    Types: Cigarettes    Quit date: 07/30/1963    Years since quitting: 57.9   Smokeless tobacco: Never  Substance Use Topics   Alcohol use: Yes    Alcohol/week: 21.0 standard drinks    Types: 21 Glasses of wine per week   Drug use: No    Home  Medications Prior to Admission medications   Medication Sig Start Date End Date Taking? Authorizing Provider  acetaminophen (TYLENOL) 325 MG tablet Take 2 tablets (650 mg total) by mouth every 4 (four) hours as needed for mild pain (or temp > 37.5 C (99.5 F)). 07/05/21  Yes Angiulli, Lavon Paganini, PA-C  aspirin EC 81 MG EC tablet Take 1 tablet (81 mg total) by mouth daily for 20 days. Swallow whole. 07/05/21 07/25/21 Yes Angiulli, Lavon Paganini, PA-C  brinzolamide (AZOPT) 1 % ophthalmic suspension Place 1 drop into both eyes 2 (two) times daily.    Yes [provider]  Calcium Carbonate (CALCIUM 500 PO) Take 1 tablet by mouth every morning.   Yes [provider]  chlorthalidone (HYGROTON) 25 MG tablet Take 25 mg by mouth every morning.   Yes [provider]  Multiple Vitamin (MULTIVITAMIN WITH MINERALS) TABS tablet Take 1 tablet by mouth every morning.   Yes [provider]  Omega-3 Fatty Acids (FISH OIL) 1000 MG CPDR Take 1,000 mg by mouth every morning.   Yes [provider]  rosuvastatin (CRESTOR) 10 MG tablet Take 1 tablet (10 mg total) by mouth daily. 07/05/21  Yes Angiulli, Lavon Paganini, PA-C  ticagrelor (BRILINTA) 90 MG TABS tablet Take 1 tablet (90 mg total) by mouth 2 (two) times daily for 20 days. 07/05/21 07/25/21 Yes [place],  Lavon Paganini, PA-C  timolol (TIMOPTIC) 0.5 % ophthalmic solution Place 1 drop into both eyes 2 (two) times daily. 02/12/21  Yes [provider]  valsartan (DIOVAN) 320 MG tablet Take 1 tablet (320 mg total) by mouth daily. 07/05/21  Yes Angiulli, Lavon Paganini, PA-C  vitamin C (ASCORBIC ACID) 500 MG tablet Take 500 mg by mouth every morning.   Yes [provider]  clopidogrel (PLAVIX) 75 MG tablet Plavix 75 mg daily beginning 07/26/2021 Patient not taking: Reported on 07/08/2021 07/05/21   Angiulli, Lavon Paganini, PA-C    Allergies    Otho Darner allergy], Fosamax [alendronate sodium], Hydrochlorothiazide, Losartan, Raloxifene,  and Ace inhibitors  Review of Systems   Review of Systems  Constitutional:  Negative for chills and fever.  HENT:  Negative for ear pain and sore throat.   Eyes:  Negative for pain and visual disturbance.  Respiratory:  Negative for cough and shortness of breath.   Cardiovascular:  Negative for chest pain and palpitations.  Gastrointestinal:  Negative for abdominal pain and vomiting.  Genitourinary:  Negative for dysuria and hematuria.  Musculoskeletal:  Negative for arthralgias and back pain.  Skin:  Negative for color change and rash.  Neurological:  Positive for speech difficulty and weakness. Negative for seizures and syncope.  All other systems reviewed and are negative.  Physical Exam Updated Vital Signs BP (!) 161/90   Pulse 95   Temp (!) 97.5 F (36.4 C) (Oral)   Resp 13   Ht 5\' 5"  (1.651 m)   Wt 78.2 kg   SpO2 94%   BMI 28.69 kg/m   Physical Exam Vitals and nursing note reviewed.  Constitutional:      General: She is not in acute distress.    Appearance: Normal appearance. She is well-developed.  HENT:     Head: Normocephalic and atraumatic.     Right Ear: External ear normal.     Left Ear: External ear normal.     Nose: Nose normal. No congestion or rhinorrhea.     Mouth/Throat:     Mouth: Mucous membranes are moist.  Eyes:     Extraocular Movements: Extraocular movements intact.     Conjunctiva/sclera: Conjunctivae normal.     Pupils: Pupils are equal, round, and reactive to light.  Cardiovascular:     Rate and Rhythm: Normal rate and regular rhythm.     Pulses: Normal pulses.     Heart sounds: No murmur heard. Pulmonary:     Effort: Pulmonary effort is normal. No respiratory distress.     Breath sounds: Normal breath sounds. No wheezing, rhonchi or rales.  Abdominal:     General: Abdomen is flat. Bowel sounds are normal.     Palpations: Abdomen is soft.     Tenderness: There is no abdominal tenderness. There is no guarding or rebound.   Musculoskeletal:        General: No swelling, tenderness or deformity.     Cervical back: Normal range of motion and neck supple. No rigidity.  Skin:    General: Skin is warm and dry.     Capillary Refill: Capillary refill takes less than 2 seconds.  Neurological:     Mental Status: She is alert.     GCS: GCS eye subscore is 4. GCS verbal subscore is 3. GCS motor subscore is 6.     Cranial Nerves: No cranial nerve deficit.     Sensory: No sensory deficit.     Motor: Weakness (Right greater than left)  present. No tremor, abnormal muscle tone or seizure activity.     Comments: Oriented to self, but not able to answer remainder of orientation questions due to significant expressive     ED Results / Procedures / Treatments   Labs (all labs ordered are listed, but only abnormal results are displayed) Labs Reviewed  APTT - Abnormal; Notable for the following components:      Result Value   aPTT 23 (*)    All other components within normal limits  CBC - Abnormal; Notable for the following components:   WBC 10.9 (*)    Platelets 414 (*)    All other components within normal limits  COMPREHENSIVE METABOLIC PANEL - Abnormal; Notable for the following components:   Sodium 127 (*)    Chloride 94 (*)    Glucose, Bld 145 (*)    All other components within normal limits  CBG MONITORING, ED - Abnormal; Notable for the following components:   Glucose-Capillary 155 (*)    All other components within normal limits  I-STAT CHEM 8, ED - Abnormal; Notable for the following components:   Sodium 129 (*)    Chloride 95 (*)    Glucose, Bld 146 (*)    Calcium, Ion 1.08 (*)    Hemoglobin 15.3 (*)    All other components within normal limits  RESP PANEL BY RT-PCR (FLU A&B, COVID) ARPGX2  ETHANOL  PROTIME-INR  DIFFERENTIAL  RAPID URINE DRUG SCREEN, HOSP PERFORMED  URINALYSIS, ROUTINE W REFLEX MICROSCOPIC    EKG None  Radiology CT HEAD CODE STROKE WO CONTRAST  Result Date:  07/08/2021 CLINICAL DATA:  Code stroke.  Acute neuro deficit.  Aphasia EXAM: CT HEAD WITHOUT CONTRAST TECHNIQUE: Contiguous axial images were obtained from the base of the skull through the vertex without intravenous contrast. COMPARISON:  CT head 06/24/2021 FINDINGS: Brain: Generalized atrophy. Chronic white matter hypodensity diffusely extending into the basal ganglia bilaterally. Small hypodensity left thalamus unchanged. Negative for acute infarct, hemorrhage, mass. Vascular: Negative for hyperdense vessel Skull: Negative Sinuses/Orbits: Paranasal sinuses clear. Bilateral cataract extraction Other: None ASPECTS ([place] Stroke Program Early CT Score) - Ganglionic level infarction (caudate, lentiform nuclei, internal capsule, insula, M1-M3 cortex): 7 - Supraganglionic infarction (M4-M6 cortex): 3 Total score (0-10 with 10 being normal): 10 IMPRESSION: 1. No acute abnormality. 2. Atrophy and extensive chronic microvascular ischemic changes. 3. ASPECTS is 10 Electronically Signed   By: Franchot Gallo M.D.   On: 07/08/2021 18:37   CT ANGIO HEAD CODE STROKE  Result Date: 07/08/2021 CLINICAL DATA:  Acute neuro deficit.  Aphasia. EXAM: CT ANGIOGRAPHY HEAD AND NECK TECHNIQUE: Multidetector CT imaging of the head and neck was performed using the standard protocol during bolus administration of intravenous contrast. Multiplanar CT image reconstructions and MIPs were obtained to evaluate the vascular anatomy. Carotid stenosis measurements (when applicable) are obtained utilizing NASCET criteria, using the distal internal carotid diameter as the denominator. CONTRAST:  54mL OMNIPAQUE IOHEXOL 350 MG/ML SOLN COMPARISON:  CT head 07/08/2021 FINDINGS: CTA NECK FINDINGS Aortic arch: Atherosclerotic calcification aortic arch. Proximal great vessels widely patent. Right carotid system: Mild atherosclerotic disease right carotid. No significant stenosis. Left carotid system: Mild atherosclerotic disease left carotid. Negative  for stenosis Vertebral arteries: Both vertebral arteries widely patent to the skull base without stenosis. Skeleton: No acute abnormality. Other neck: Mild enlargement of the right lobe of the thyroid. Subcentimeter nodule right lower pole. No further imaging required (ref: J Am Coll Radiol. 2015 Feb;12(2): 143-50). Upper chest: Lung apices  clear bilaterally. Review of the MIP images confirms the above findings CTA HEAD FINDINGS Anterior circulation: Cavernous carotid widely patent with mild atherosclerotic disease. Mild intracranial atherosclerotic disease with vessel irregularity in the anterior and middle cerebral arteries bilaterally. No significant stenosis. Posterior circulation: Both vertebral arteries patent to the basilar. Basilar widely patent. Posterior cerebral arteries patent bilaterally with mild atherosclerotic irregularity. Negative for large vessel occlusion Venous sinuses: Minimal venous enhancement Anatomic variants: None Review of the MIP images confirms the above findings IMPRESSION: 1. Negative for intracranial large vessel occlusion 2. 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 3. Minimal atherosclerotic disease in the carotid arteries. No carotid or vertebral artery stenosis in the neck. 4. Code stroke imaging results were communicated on 07/08/2021 at 7:01 pm to provider Lorrin Goodell via text page Electronically Signed   By: Franchot Gallo M.D.   On: 07/08/2021 19:02   CT ANGIO NECK CODE STROKE  Result Date: 07/08/2021 CLINICAL DATA:  Acute neuro deficit.  Aphasia. EXAM: CT ANGIOGRAPHY HEAD AND NECK TECHNIQUE: Multidetector CT imaging of the head and neck was performed using the standard protocol during bolus administration of intravenous contrast. Multiplanar CT image reconstructions and MIPs were obtained to evaluate the vascular anatomy. Carotid stenosis measurements (when applicable) are obtained  utilizing NASCET criteria, using the distal internal carotid diameter as the denominator. CONTRAST:  29mL OMNIPAQUE IOHEXOL 350 MG/ML SOLN COMPARISON:  CT head 07/08/2021 FINDINGS: CTA NECK FINDINGS Aortic arch: Atherosclerotic calcification aortic arch. Proximal great vessels widely patent. Right carotid system: Mild atherosclerotic disease right carotid. No significant stenosis. Left carotid system: Mild atherosclerotic disease left carotid. Negative for stenosis Vertebral arteries: Both vertebral arteries widely patent to the skull base without stenosis. Skeleton: No acute abnormality. Other neck: Mild enlargement of the right lobe of the thyroid. Subcentimeter nodule right lower pole. No further imaging required (ref: J Am Coll Radiol. 2015 Feb;12(2): 143-50). Upper chest: Lung apices clear bilaterally. Review of the MIP images confirms the above findings CTA HEAD FINDINGS Anterior circulation: Cavernous carotid widely patent with mild atherosclerotic disease. Mild intracranial atherosclerotic disease with vessel irregularity in the anterior and middle cerebral arteries bilaterally. No significant stenosis. Posterior circulation: Both vertebral arteries patent to the basilar. Basilar widely patent. Posterior cerebral arteries patent bilaterally with mild atherosclerotic irregularity. Negative for large vessel occlusion Venous sinuses: Minimal venous enhancement Anatomic variants: None Review of the MIP images confirms the above findings IMPRESSION: 1. Negative for intracranial large vessel occlusion 2. 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 3. Minimal atherosclerotic disease in the carotid arteries. No carotid or vertebral artery stenosis in the neck. 4. Code stroke imaging results were communicated on 07/08/2021 at 7:01 pm to provider Lorrin Goodell via text page Electronically Signed   By: Franchot Gallo M.D.   On:  07/08/2021 19:02    Procedures Procedures   Medications Ordered in ED Medications  iohexol (OMNIPAQUE) 350 MG/ML injection 75 mL (75 mLs Intravenous Contrast Given 07/08/21 1841)    ED Course  I have reviewed the triage vital signs and the nursing notes.  Pertinent labs & imaging results that were available during my care of the patient were reviewed by me and considered in my medical decision making (see chart for details).    MDM Rules/Calculators/A&P                          This  is an 80 year old female with a PMHx sig for HTN, glaucoma, TIA, HLD, breast cancer s/p bilateral mastectomy.  She had a a recent admission for L basal ganglia stroke that has left her with some residual expressive aphasia and right-sided weakness.  Husband notes that these symptoms had improved over the course of her rehabilitation, she had an abrupt worsening of her aphasia and weakness earlier this evening at 1700.  Patient rapidly assessed.  ABCs intact.  Code stroke activated.  Head imaging showed no new infarct.  Discussed with neurology team.  Recommend admission for further metabolic work-up and EEG in the morning.  Patient is having no infectious symptoms.  She is afebrile here.  Low concern for infectious etiology of recrudescence.  Medicine team contacted.  Handoff given.   Final Clinical Impression(s) / ED Diagnoses Final diagnoses:  Aphasia    Rx / DC Orders ED Discharge Orders     None        Idamae Lusher, MD 07/08/21 [facility] [place]    Davonna Belling, MD 07/10/21 [phone]

## 2021-07-08 NOTE — H&P (Signed)
History and Physical    Sandra Becker KKX:[id number] DOB: 12/06/1940 DOA: 07/08/2021  PCP: Kelton Pillar, MD Patient coming from: Independent living facility  Chief Complaint: Aphasia, right-sided weakness  HPI: Sandra Becker is a 80 y.o. female with medical history significant of TIA, hypertension, hyperlipidemia, prediabetes, glaucoma, breast cancer status post bilateral mastectomy.  Recently admitted on 11/27 for acute onset slurred speech and right-sided weakness.  MRI revealed left basal ganglia infarct.  Felt to be due to small vessel disease versus cardioembolic source.  Recent echo showing EF 60 to 16%, grade 1 diastolic dysfunction, moderate mitral annular calcification, mild to moderate aortic valve sclerosis/calcification.  Recent labs showing A1c 5.7, LDL 75.  Patient was started on aspirin 81 mg daily and Brilinta 90 mg twice daily x30 days, then continue Plavix alone starting 07/25/2021.  Neurology had recommended 30-day cardiac monitor as an outpatient to rule out A. fib.  Patient was discharged to [facility] on 12/1 and returned to her independent living facility yesterday.  She returned to the ED today due to abrupt worsening of her aphasia and right-sided weakness earlier this evening at 1700.  Code stroke activated.  CT head negative for acute intracranial abnormality.  CT angio head and neck negative for LVO. Sodium 127, chronically low and no significant change from baseline.  No other significant metabolic derangements on labs.  Blood ethanol level undetectable.  UA and UDS pending.  COVID and influenza PCR pending. Neurology recommended obtaining routine EEG in the morning.  Patient is having difficulty giving history due to expressive aphasia.  She denies fevers, cough, shortness of breath, chest pain, nausea, vomiting, abdominal pain, diarrhea, dysuria, or urinary frequency/urgency.  History provided mostly by husband at bedside who states around 5 PM today patient was  trying to put in her hearing aids when all of a sudden she "tipped over" but did not fall hard or injure her head as her walker was in the way.  She could not speak to him at that time and could not move her right arm.  Initially her eyes were open and she was not responding but later her eyes closed and he thinks she might have passed out for about 5 or 10 minutes.  States he called EMS and was keeping an eye on her breathing which was normal.  Husband thinks patient's speech has improved since she has been in the emergency room and she was able to answer a few questions.  Also feels that her right-sided weakness has improved as when other doctors came into the room she was able to move her arms and legs.  Review of Systems:  All systems reviewed and apart from history of presenting illness, are negative.  Past Medical History:  Diagnosis Date   Breast cancer ([place]) 1993   bilateral mastectomy; no chemo or radiation   Glaucoma    Hyperlipidemia    Hypertension     Past Surgical History:  Procedure Laterality Date   MASTECTOMY Bilateral Laura     reports that she quit smoking about 57 years ago. She has never used smokeless tobacco. She reports current alcohol use of about 21.0 standard drinks per week. She reports that she does not use drugs.  Allergies  Allergen Reactions   Crab [Shellfish Allergy] Other (See Comments)    From skin test   Fosamax [Alendronate Sodium] Other (See Comments)    Difficult swallowing  Hydrochlorothiazide Other (See Comments)    hyponatremia   Losartan Cough   Raloxifene Other (See Comments)    Unknown reaction   Ace Inhibitors Cough    Reaction to losartan (pt tolerates valsartan)    Family History  Problem Relation Age of Onset   Colon cancer Neg Hx     Prior to Admission medications   Medication Sig Start Date End Date Taking? Authorizing Provider  acetaminophen (TYLENOL) 325 MG  tablet Take 2 tablets (650 mg total) by mouth every 4 (four) hours as needed for mild pain (or temp > 37.5 C (99.5 F)). 07/05/21  Yes Angiulli, Lavon Paganini, PA-C  Multiple Vitamin (MULTIVITAMIN WITH MINERALS) TABS tablet Take 1 tablet by mouth daily.   Yes [provider]  aspirin EC 81 MG EC tablet Take 1 tablet (81 mg total) by mouth daily for 20 days. Swallow whole. 07/05/21 07/25/21  Angiulli, Lavon Paganini, PA-C  brinzolamide (AZOPT) 1 % ophthalmic suspension Place 1 drop into both eyes 2 (two) times daily.     [provider]  Calcium Carbonate (CALCIUM 500 PO) Take 1 tablet by mouth daily.    [provider]  clopidogrel (PLAVIX) 75 MG tablet Plavix 75 mg daily beginning 07/26/2021 07/05/21   Angiulli, Lavon Paganini, PA-C  Omega-3 Fatty Acids (FISH OIL) 1000 MG CPDR Take 1,000 mg by mouth daily.    [provider]  rosuvastatin (CRESTOR) 10 MG tablet Take 1 tablet (10 mg total) by mouth daily. 07/05/21   Angiulli, Lavon Paganini, PA-C  ticagrelor (BRILINTA) 90 MG TABS tablet Take 1 tablet (90 mg total) by mouth 2 (two) times daily for 20 days. 07/05/21 07/25/21  Angiulli, Lavon Paganini, PA-C  timolol (TIMOPTIC) 0.5 % ophthalmic solution Place 1 drop into both eyes 2 (two) times daily. 02/12/21   [provider]  valsartan (DIOVAN) 320 MG tablet Take 1 tablet (320 mg total) by mouth daily. 07/05/21   Angiulli, Lavon Paganini, PA-C  vitamin C (ASCORBIC ACID) 500 MG tablet Take 500 mg by mouth daily.    [provider]    Physical Exam: Vitals:   07/08/21 1745 07/08/21 1800 07/08/21 1815 07/08/21 1915  BP: (!) 152/71 (!) 149/71 (!) 162/81 (!) 161/90  Pulse: 86 84 80 95  Resp: 14 17 18 13   Temp:      TempSrc:      SpO2: 96% 96% 98% 94%  Weight:      Height:        Physical Exam Constitutional:      General: She is not in acute distress. HENT:     Head: Normocephalic and atraumatic.  Eyes:     Extraocular Movements: Extraocular movements intact.      Conjunctiva/sclera: Conjunctivae normal.  Cardiovascular:     Rate and Rhythm: Normal rate and regular rhythm.     Pulses: Normal pulses.  Pulmonary:     Effort: Pulmonary effort is normal. No respiratory distress.     Breath sounds: Normal breath sounds. No wheezing or rales.  Abdominal:     General: Bowel sounds are normal. There is no distension.     Palpations: Abdomen is soft.     Tenderness: There is no abdominal tenderness.  Musculoskeletal:        General: No swelling or tenderness.     Cervical back: Normal range of motion and neck supple.  Skin:    General: Skin is warm and dry.  Neurological:     Mental Status: She is  alert.     Comments: Expressive aphasia No facial droop No pronator drift Strength and sensation to light touch grossly intact in all extremities.     Labs on Admission: I have personally reviewed following labs and imaging studies  CBC: Recent Labs  Lab 07/08/21 1815 07/08/21 1821  WBC 10.9*  --   NEUTROABS 7.7  --   HGB 14.2 15.3*  HCT 42.4 45.0  MCV 90.8  --   PLT 414*  --    Basic Metabolic Panel: Recent Labs  Lab 07/02/21 0528 07/03/21 0512 07/08/21 1815 07/08/21 1821  NA 132* 132* 127* 129*  K 5.3* 4.3 3.9 3.6  CL 98 100 94* 95*  CO2 29 23 22   --   GLUCOSE 113* 102* 145* 146*  BUN 12 10 15 16   CREATININE 0.61 0.53 0.77 0.70  CALCIUM 9.1 8.9 9.6  --    GFR: Estimated Creatinine Clearance: 58 mL/min (by C-G formula based on SCr of 0.7 mg/dL). Liver Function Tests: Recent Labs  Lab 07/08/21 1815  AST 25  ALT 32  ALKPHOS 48  BILITOT 0.8  PROT 6.7  ALBUMIN 3.7   No results for input(s): LIPASE, AMYLASE in the last 168 hours. No results for input(s): AMMONIA in the last 168 hours. Coagulation Profile: Recent Labs  Lab 07/08/21 1815  INR 0.9   Cardiac Enzymes: No results for input(s): CKTOTAL, CKMB, CKMBINDEX, TROPONINI in the last 168 hours. BNP (last 3 results) No results for input(s): PROBNP in the last 8760  hours. HbA1C: No results for input(s): HGBA1C in the last 72 hours. CBG: Recent Labs  Lab 07/08/21 1814  GLUCAP 155*   Lipid Profile: No results for input(s): CHOL, HDL, LDLCALC, TRIG, CHOLHDL, LDLDIRECT in the last 72 hours. Thyroid Function Tests: No results for input(s): TSH, T4TOTAL, FREET4, T3FREE, THYROIDAB in the last 72 hours. Anemia Panel: No results for input(s): VITAMINB12, FOLATE, FERRITIN, TIBC, IRON, RETICCTPCT in the last 72 hours. Urine analysis:    Component Value Date/Time   COLORURINE STRAW (A) 05/03/2021 1423   APPEARANCEUR CLEAR 05/03/2021 1423   LABSPEC 1.016 05/03/2021 1423   PHURINE 8.0 05/03/2021 1423   GLUCOSEU NEGATIVE 05/03/2021 1423   HGBUR NEGATIVE 05/03/2021 [place] 05/03/2021 [place] 05/03/2021 1423   PROTEINUR NEGATIVE 05/03/2021 1423   UROBILINOGEN 0.2 04/27/2011 2131   NITRITE NEGATIVE 05/03/2021 1423   LEUKOCYTESUR NEGATIVE 05/03/2021 1423    Radiological Exams on Admission: CT HEAD CODE STROKE WO CONTRAST  Result Date: 07/08/2021 CLINICAL DATA:  Code stroke.  Acute neuro deficit.  Aphasia EXAM: CT HEAD WITHOUT CONTRAST TECHNIQUE: Contiguous axial images were obtained from the base of the skull through the vertex without intravenous contrast. COMPARISON:  CT head 06/24/2021 FINDINGS: Brain: Generalized atrophy. Chronic white matter hypodensity diffusely extending into the basal ganglia bilaterally. Small hypodensity left thalamus unchanged. Negative for acute infarct, hemorrhage, mass. Vascular: Negative for hyperdense vessel Skull: Negative Sinuses/Orbits: Paranasal sinuses clear. Bilateral cataract extraction Other: None ASPECTS ([place] Stroke Program Early CT Score) - Ganglionic level infarction (caudate, lentiform nuclei, internal capsule, insula, M1-M3 cortex): 7 - Supraganglionic infarction (M4-M6 cortex): 3 Total score (0-10 with 10 being normal): 10 IMPRESSION: 1. No acute abnormality. 2. Atrophy  and extensive chronic microvascular ischemic changes. 3. ASPECTS is 10 Electronically Signed   By: Franchot Gallo M.D.   On: 07/08/2021 18:37   CT ANGIO HEAD CODE STROKE  Result Date: 07/08/2021 CLINICAL DATA:  Acute neuro deficit.  Aphasia.  EXAM: CT ANGIOGRAPHY HEAD AND NECK TECHNIQUE: Multidetector CT imaging of the head and neck was performed using the standard protocol during bolus administration of intravenous contrast. Multiplanar CT image reconstructions and MIPs were obtained to evaluate the vascular anatomy. Carotid stenosis measurements (when applicable) are obtained utilizing NASCET criteria, using the distal internal carotid diameter as the denominator. CONTRAST:  84mL OMNIPAQUE IOHEXOL 350 MG/ML SOLN COMPARISON:  CT head 07/08/2021 FINDINGS: CTA NECK FINDINGS Aortic arch: Atherosclerotic calcification aortic arch. Proximal great vessels widely patent. Right carotid system: Mild atherosclerotic disease right carotid. No significant stenosis. Left carotid system: Mild atherosclerotic disease left carotid. Negative for stenosis Vertebral arteries: Both vertebral arteries widely patent to the skull base without stenosis. Skeleton: No acute abnormality. Other neck: Mild enlargement of the right lobe of the thyroid. Subcentimeter nodule right lower pole. No further imaging required (ref: J Am Coll Radiol. 2015 Feb;12(2): 143-50). Upper chest: Lung apices clear bilaterally. Review of the MIP images confirms the above findings CTA HEAD FINDINGS Anterior circulation: Cavernous carotid widely patent with mild atherosclerotic disease. Mild intracranial atherosclerotic disease with vessel irregularity in the anterior and middle cerebral arteries bilaterally. No significant stenosis. Posterior circulation: Both vertebral arteries patent to the basilar. Basilar widely patent. Posterior cerebral arteries patent bilaterally with mild atherosclerotic irregularity. Negative for large vessel occlusion Venous sinuses:  Minimal venous enhancement Anatomic variants: None Review of the MIP images confirms the above findings IMPRESSION: 1. Negative for intracranial large vessel occlusion 2. 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 3. Minimal atherosclerotic disease in the carotid arteries. No carotid or vertebral artery stenosis in the neck. 4. Code stroke imaging results were communicated on 07/08/2021 at 7:01 pm to provider Lorrin Goodell via text page Electronically Signed   By: Franchot Gallo M.D.   On: 07/08/2021 19:02   CT ANGIO NECK CODE STROKE  Result Date: 07/08/2021 CLINICAL DATA:  Acute neuro deficit.  Aphasia. EXAM: CT ANGIOGRAPHY HEAD AND NECK TECHNIQUE: Multidetector CT imaging of the head and neck was performed using the standard protocol during bolus administration of intravenous contrast. Multiplanar CT image reconstructions and MIPs were obtained to evaluate the vascular anatomy. Carotid stenosis measurements (when applicable) are obtained utilizing NASCET criteria, using the distal internal carotid diameter as the denominator. CONTRAST:  31mL OMNIPAQUE IOHEXOL 350 MG/ML SOLN COMPARISON:  CT head 07/08/2021 FINDINGS: CTA NECK FINDINGS Aortic arch: Atherosclerotic calcification aortic arch. Proximal great vessels widely patent. Right carotid system: Mild atherosclerotic disease right carotid. No significant stenosis. Left carotid system: Mild atherosclerotic disease left carotid. Negative for stenosis Vertebral arteries: Both vertebral arteries widely patent to the skull base without stenosis. Skeleton: No acute abnormality. Other neck: Mild enlargement of the right lobe of the thyroid. Subcentimeter nodule right lower pole. No further imaging required (ref: J Am Coll Radiol. 2015 Feb;12(2): 143-50). Upper chest: Lung apices clear bilaterally. Review of the MIP images confirms the above findings CTA HEAD FINDINGS Anterior  circulation: Cavernous carotid widely patent with mild atherosclerotic disease. Mild intracranial atherosclerotic disease with vessel irregularity in the anterior and middle cerebral arteries bilaterally. No significant stenosis. Posterior circulation: Both vertebral arteries patent to the basilar. Basilar widely patent. Posterior cerebral arteries patent bilaterally with mild atherosclerotic irregularity. Negative for large vessel occlusion Venous sinuses: Minimal venous enhancement Anatomic variants: None Review of the MIP images confirms the above findings IMPRESSION: 1. Negative for intracranial large vessel occlusion 2. Intracranial atherosclerotic disease with mild irregularity of the anterior, middle, and  posterior cerebral arteries bilaterally. 3. Minimal atherosclerotic disease in the carotid arteries. No carotid or vertebral artery stenosis in the neck. 4. Code stroke imaging results were communicated on 07/08/2021 at 7:01 pm to provider Lorrin Goodell via text page Electronically Signed   By: Franchot Gallo M.D.   On: 07/08/2021 19:02    EKG: Independently reviewed.  Sinus rhythm.  Assessment/Plan Principal Problem:   Stroke-like symptoms Active Problems:   Hyponatremia   Essential hypertension   Prediabetes   Hyperlipidemia, unspecified   Aphasia and right-sided weakness Recently admitted on 11/27 for acute onset slurred speech and right-sided weakness.  MRI revealed left basal ganglia infarct.  Felt to be due to small vessel disease versus cardioembolic source.  Recent echo showing EF 60 to 94%, grade 1 diastolic dysfunction, moderate mitral annular calcification, mild to moderate aortic valve sclerosis/calcification.  Recent labs showing A1c 5.7, LDL 75.  Patient was started on aspirin 81 mg daily and Brilinta 90 mg twice daily x30 days, then continue Plavix alone starting 07/25/2021.  Neurology had recommended 30-day cardiac monitor as an outpatient to rule out A. fib.  Patient was  discharged to [facility] on 12/1 and returned to her independent living facility yesterday.  She returned to the ED today due to abrupt worsening of her aphasia and right-sided weakness earlier this evening at 1700.  Code stroke activated.  CT head negative for acute intracranial abnormality.  CT angio head and neck negative for LVO. Sodium 127, chronically low and no significant change from baseline.  No other significant metabolic derangements on labs.  Blood ethanol level undetectable.  UA and UDS pending.  COVID and influenza PCR pending. Neurology recommended obtaining routine EEG in the morning. -Telemetry monitoring -Brain MRI without contrast ordered -Antiplatelet therapy recommendations per neurology -Continue Crestor -Frequent neurochecks -PT, OT, speech therapy. -N.p.o. until cleared by bedside swallow evaluation or formal speech evaluation   Chronic hyponatremia -Gentle IV fluid hydration, continue to monitor.  Check serum osmolarity.  Hypertension -Allow permissive hypertension at this time, MRI pending  Hyperlipidemia -Continue Crestor  Prediabetes Recent A1c 5.7. -Encourage lifestyle modifications such as healthy diet and exercise.  DVT prophylaxis: Lovenox Code Status: Full code-discussed with the patient and her husband. Family Communication: Husband at bedside. Disposition Plan: Status is: Observation  The patient remains OBS appropriate and will d/c before 2 midnights.  Level of care: Level of care: Telemetry Medical  The medical decision making on this patient was of high complexity and the patient is at high risk for clinical deterioration, therefore this is a level 3 visit.  Shela Leff MD [facility]  If 7PM-7AM, please contact night-coverage [email or web address]  07/08/2021, 8:41 PM

## 2021-07-08 NOTE — ED Notes (Signed)
ED Provider at bedside. 

## 2021-07-09 ENCOUNTER — Observation Stay (HOSPITAL_COMMUNITY): Payer: Medicare Other

## 2021-07-09 DIAGNOSIS — I6381 Other cerebral infarction due to occlusion or stenosis of small artery: Secondary | ICD-10-CM | POA: Diagnosis present

## 2021-07-09 DIAGNOSIS — I639 Cerebral infarction, unspecified: Secondary | ICD-10-CM | POA: Diagnosis present

## 2021-07-09 DIAGNOSIS — I69351 Hemiplegia and hemiparesis following cerebral infarction affecting right dominant side: Secondary | ICD-10-CM | POA: Diagnosis not present

## 2021-07-09 DIAGNOSIS — I10 Essential (primary) hypertension: Secondary | ICD-10-CM | POA: Diagnosis not present

## 2021-07-09 DIAGNOSIS — I6389 Other cerebral infarction: Secondary | ICD-10-CM | POA: Diagnosis not present

## 2021-07-09 DIAGNOSIS — I672 Cerebral atherosclerosis: Secondary | ICD-10-CM | POA: Diagnosis present

## 2021-07-09 DIAGNOSIS — R29810 Facial weakness: Secondary | ICD-10-CM | POA: Diagnosis present

## 2021-07-09 DIAGNOSIS — Z8673 Personal history of transient ischemic attack (TIA), and cerebral infarction without residual deficits: Secondary | ICD-10-CM

## 2021-07-09 DIAGNOSIS — E785 Hyperlipidemia, unspecified: Secondary | ICD-10-CM | POA: Diagnosis present

## 2021-07-09 DIAGNOSIS — R7303 Prediabetes: Secondary | ICD-10-CM | POA: Diagnosis present

## 2021-07-09 DIAGNOSIS — R2990 Unspecified symptoms and signs involving the nervous system: Secondary | ICD-10-CM | POA: Diagnosis not present

## 2021-07-09 DIAGNOSIS — Z853 Personal history of malignant neoplasm of breast: Secondary | ICD-10-CM | POA: Diagnosis not present

## 2021-07-09 DIAGNOSIS — E871 Hypo-osmolality and hyponatremia: Secondary | ICD-10-CM

## 2021-07-09 DIAGNOSIS — R4701 Aphasia: Secondary | ICD-10-CM | POA: Diagnosis present

## 2021-07-09 DIAGNOSIS — R55 Syncope and collapse: Secondary | ICD-10-CM | POA: Diagnosis present

## 2021-07-09 DIAGNOSIS — Z888 Allergy status to other drugs, medicaments and biological substances status: Secondary | ICD-10-CM | POA: Diagnosis not present

## 2021-07-09 DIAGNOSIS — Y92009 Unspecified place in unspecified non-institutional (private) residence as the place of occurrence of the external cause: Secondary | ICD-10-CM | POA: Diagnosis not present

## 2021-07-09 DIAGNOSIS — I69320 Aphasia following cerebral infarction: Secondary | ICD-10-CM | POA: Diagnosis not present

## 2021-07-09 DIAGNOSIS — Z9013 Acquired absence of bilateral breasts and nipples: Secondary | ICD-10-CM | POA: Diagnosis not present

## 2021-07-09 DIAGNOSIS — R29702 NIHSS score 2: Secondary | ICD-10-CM | POA: Diagnosis present

## 2021-07-09 DIAGNOSIS — R4182 Altered mental status, unspecified: Secondary | ICD-10-CM | POA: Diagnosis present

## 2021-07-09 DIAGNOSIS — Z91013 Allergy to seafood: Secondary | ICD-10-CM | POA: Diagnosis not present

## 2021-07-09 DIAGNOSIS — Z20822 Contact with and (suspected) exposure to covid-19: Secondary | ICD-10-CM | POA: Diagnosis present

## 2021-07-09 DIAGNOSIS — I119 Hypertensive heart disease without heart failure: Secondary | ICD-10-CM | POA: Diagnosis present

## 2021-07-09 DIAGNOSIS — W19XXXA Unspecified fall, initial encounter: Secondary | ICD-10-CM | POA: Diagnosis present

## 2021-07-09 DIAGNOSIS — Z87891 Personal history of nicotine dependence: Secondary | ICD-10-CM | POA: Diagnosis not present

## 2021-07-09 DIAGNOSIS — H409 Unspecified glaucoma: Secondary | ICD-10-CM | POA: Diagnosis present

## 2021-07-09 LAB — LIPID PANEL
Cholesterol: 120 mg/dL (ref 0–200)
HDL: 65 mg/dL (ref >40)
LDL Cholesterol: 39 mg/dL (ref 0–99)
Total CHOL/HDL Ratio: 1.8 RATIO
Triglycerides: 79 mg/dL (ref <150)
VLDL: 16 mg/dL (ref 0–40)

## 2021-07-09 LAB — URINALYSIS, ROUTINE W REFLEX MICROSCOPIC
Bilirubin Urine: NEGATIVE
Glucose, UA: NEGATIVE mg/dL
Hgb urine dipstick: NEGATIVE
Ketones, ur: NEGATIVE mg/dL
Leukocytes,Ua: NEGATIVE
Nitrite: NEGATIVE
Protein, ur: NEGATIVE mg/dL
Specific Gravity, Urine: 1.04 — ABNORMAL HIGH (ref 1.005–1.030)
pH: 6 (ref 5.0–8.0)

## 2021-07-09 LAB — ECHOCARDIOGRAM COMPLETE
Area-P 1/2: 4.89 cm2
Calc EF: 63.9 %
Height: 65 in
S' Lateral: 2.5 cm
Single Plane A2C EF: 67.2 %
Single Plane A4C EF: 54.5 %
Weight: 2758.4 oz

## 2021-07-09 LAB — RAPID URINE DRUG SCREEN, HOSP PERFORMED
Amphetamines: NOT DETECTED
Barbiturates: NOT DETECTED
Benzodiazepines: NOT DETECTED
Cocaine: NOT DETECTED
Opiates: NOT DETECTED
Tetrahydrocannabinol: NOT DETECTED

## 2021-07-09 LAB — OSMOLALITY: Osmolality: 276 mOsm/kg (ref 275–295)

## 2021-07-09 LAB — HEMOGLOBIN A1C
Hgb A1c MFr Bld: 5.7 % — ABNORMAL HIGH (ref 4.8–5.6)
Mean Plasma Glucose: 116.89 mg/dL

## 2021-07-09 MED ORDER — TICAGRELOR 90 MG PO TABS
90.0000 mg | ORAL_TABLET | Freq: Two times a day (BID) | ORAL | Status: DC
  Administered 2021-07-09 – 2021-07-10 (×3): 90 mg via ORAL
  Filled 2021-07-09 (×4): qty 1

## 2021-07-09 MED ORDER — IRBESARTAN 300 MG PO TABS
300.0000 mg | ORAL_TABLET | Freq: Every day | ORAL | Status: DC
  Administered 2021-07-09 – 2021-07-10 (×2): 300 mg via ORAL
  Filled 2021-07-09 (×2): qty 1

## 2021-07-09 NOTE — Progress Notes (Signed)
EEG done at bedside. No skin breakdown noted. Results pending. 

## 2021-07-09 NOTE — Progress Notes (Signed)
SLP Cancellation Note  Patient Details Name: Sandra Becker MRN: [id number] DOB: June 11, 1941   Cancelled treatment:       Reason Eval/Treat Not Completed: SLP screened, no needs identified, will sign off  Pt passed [facility] swallow screen on 12/11, a diet has been ordered, and [name], RN denied observance of any overt symptoms of dysphagia. No formalized SLP swallow eval is needed per protocol. SLP Will sign off.    Amantha Sklar I. Hardin Negus, [place], [place] Office number [phone] Pager [place] 07/09/2021, 3:59 PM

## 2021-07-09 NOTE — Progress Notes (Signed)
LTM hooked and started at bedside. No skin breakdown noted. Results pending.

## 2021-07-09 NOTE — Progress Notes (Signed)
Ltm hooked at bedside. No skin breakdown noted. Results pending.

## 2021-07-09 NOTE — Progress Notes (Signed)
[facility] PROGRESS NOTE   Sandra Becker GTX:[id number] DOB: [birth year] DOA: 07/08/2021  PCP: Kelton Pillar, MD  Brief History/Interval Summary: 80 y.o. female with medical history significant of TIA, hypertension, hyperlipidemia, prediabetes, glaucoma, breast cancer status post bilateral mastectomy.  Recently admitted on 11/27 for acute onset slurred speech and right-sided weakness.  MRI revealed left basal ganglia infarct.  Felt to be due to small vessel disease versus cardioembolic source.  Recent echo showing EF 60 to 24%, grade 1 diastolic dysfunction, moderate mitral annular calcification, mild to moderate aortic valve sclerosis/calcification.  Recent labs showing A1c 5.7, LDL 75.  Patient was started on aspirin 81 mg daily and Brilinta 90 mg twice daily x30 days, then continue Plavix alone starting 07/25/2021.  Neurology had recommended 30-day cardiac monitor as an outpatient to rule out A. fib.  Patient was discharged to [facility] on 12/1 and returned to her independent living facility on 12/10.  She returned to the ED on 12/11 due to abrupt worsening of her aphasia and right-sided weakness.  Patient was seen by neurology and was hospitalized.   Reason for Visit: Transient right-sided weakness with aphasia  Consultants: Neurology  Procedures: EEG.  Transthoracic echocardiogram    Subjective/Interval History: Patient mentions that the right-sided weakness and the difficulty speaking symptoms have improved some.  Denies any headaches.  No chest pain or shortness of breath.  No nausea vomiting.     Assessment/Plan:  Right-sided weakness with aphasia in the setting of recent stroke MRI did not reveal any new infarct did show expansion of the previously seen left basal ganglia infarct.  Patient had stroke work-up recently including an echocardiogram which showed normal systolic function with grade 1 diastolic dysfunction.   LDL was 75 during previous admission, now it is 39.   HbA1c 5.7.   Urine drug screen unremarkable.   Patient was on aspirin and Brilinta to be taken till 12/27 following which patient to start taking Plavix. Patient is on Crestor. PT OT SLP evaluation EEG has been ordered by neurology. Await further input from stroke service.  Hyponatremia This appears to be chronic.  Stable for the most part.  Continue to monitor.  Essential hypertension Allowing permissive hypertension.  On Diovan prior to admission.  Since there is no new stroke should be able to resume her antihypertensives.  We will wait for neuro input regarding this matter.  History of glaucoma Continue with eyedrops.  Prediabetes HbA1c 5.7   DVT Prophylaxis: Lovenox Code Status: Full code Family Communication: Discussed with the patient Disposition Plan: To be determined  Status is: Observation  The patient will require care spanning > 2 midnights and should be moved to inpatient because: New neurological symptoms in the setting of recent stroke      Medications: Scheduled:  aspirin EC  81 mg Oral Daily   brinzolamide  1 drop Both Eyes BID   enoxaparin (LOVENOX) injection  40 mg Subcutaneous Q24H   rosuvastatin  10 mg Oral Daily   ticagrelor  90 mg Oral BID   timolol  1 drop Both Eyes BID   Continuous: MGN:[id number] **OR** acetaminophen  Antibiotics: Anti-infectives (From admission, onward)    None       Objective:  Vital Signs  Vitals:   07/09/21 0745 07/09/21 0800 07/09/21 0815 07/09/21 0830  BP: (!) 168/71 (!) 159/91 (!) 167/80 (!) 172/75  Pulse: 90 91 99 90  Resp: 12 12 14 13   Temp:      TempSrc:  SpO2: 100% 99% 99% 99%  Weight:      Height:        Intake/Output Summary (Last 24 hours) at 07/09/2021 0851 Last data filed at 07/09/2021 0545 Gross per 24 hour  Intake 614.91 ml  Output --  Net 614.91 ml   Filed Weights   07/08/21 1734  Weight: 78.2 kg    General appearance: Awake alert.  In no distress Resp: Clear to  auscultation bilaterally.  Normal effort Cardio: S1-S2 is normal regular.  No S3-S4.  No rubs murmurs or bruit GI: Abdomen is soft.  Nontender nondistended.  Bowel sounds are present normal.  No masses organomegaly Extremities: No edema.  Moving all of her extremities Neurologic: Alert and oriented x3.  No focal neurological deficits.    Lab Results:  Data Reviewed: I have personally reviewed following labs and imaging studies  CBC: Recent Labs  Lab 07/08/21 1815 07/08/21 1821  WBC 10.9*  --   NEUTROABS 7.7  --   HGB 14.2 15.3*  HCT 42.4 45.0  MCV 90.8  --   PLT 414*  --     Basic Metabolic Panel: Recent Labs  Lab 07/03/21 0512 07/08/21 1815 07/08/21 1821  NA 132* 127* 129*  K 4.3 3.9 3.6  CL 100 94* 95*  CO2 23 22  --   GLUCOSE 102* 145* 146*  BUN 10 15 16   CREATININE 0.53 0.77 0.70  CALCIUM 8.9 9.6  --     GFR: Estimated Creatinine Clearance: 58 mL/min (by C-G formula based on SCr of 0.7 mg/dL).  Liver Function Tests: Recent Labs  Lab 07/08/21 1815  AST 25  ALT 32  ALKPHOS 48  BILITOT 0.8  PROT 6.7  ALBUMIN 3.7     Coagulation Profile: Recent Labs  Lab 07/08/21 1815  INR 0.9     HbA1C: Recent Labs    07/09/21 0504  HGBA1C 5.7*    CBG: Recent Labs  Lab 07/08/21 1814  GLUCAP 155*    Lipid Profile: Recent Labs    07/09/21 0504  CHOL 120  HDL 65  LDLCALC 39  TRIG 79  CHOLHDL 1.8     Recent Results (from the past 240 hour(s))  Resp Panel by RT-PCR (Flu A&B, Covid) Nasopharyngeal Swab     Status: None   Collection Time: 07/08/21  6:54 PM   Specimen: Nasopharyngeal Swab; Nasopharyngeal(NP) swabs in vial transport medium  Result Value Ref Range Status   SARS Coronavirus 2 by RT PCR NEGATIVE NEGATIVE Final    Comment: (NOTE) SARS-CoV-2 target nucleic acids are NOT DETECTED.  The SARS-CoV-2 RNA is generally detectable in upper respiratory specimens during the acute phase of infection. The lowest concentration of SARS-CoV-2  viral copies this assay can detect is 138 copies/mL. A negative result does not preclude SARS-Cov-2 infection and should not be used as the sole basis for treatment or other patient management decisions. A negative result may occur with  improper specimen collection/handling, submission of specimen other than nasopharyngeal swab, presence of viral mutation(s) within the areas targeted by this assay, and inadequate number of viral copies(<138 copies/mL). A negative result must be combined with clinical observations, patient history, and epidemiological information. The expected result is Negative.  Fact Sheet for Patients:  EntrepreneurPulse.com.au  Fact Sheet for Healthcare Providers:  IncredibleEmployment.be  This test is no t yet approved or cleared by the [place] FDA and  has been authorized for detection and/or diagnosis of SARS-CoV-2 by FDA under an Emergency Use Authorization (EUA).  This EUA will remain  in effect (meaning this test can be used) for the duration of the COVID-19 declaration under Section 564(b)(1) of the Act, 21 U.S.C.section 360bbb-3(b)(1), unless the authorization is terminated  or revoked sooner.       Influenza A by PCR NEGATIVE NEGATIVE Final   Influenza B by PCR NEGATIVE NEGATIVE Final    Comment: (NOTE) The Xpert Xpress SARS-CoV-2/FLU/RSV plus assay is intended as an aid in the diagnosis of influenza from Nasopharyngeal swab specimens and should not be used as a sole basis for treatment. Nasal washings and aspirates are unacceptable for Xpert Xpress SARS-CoV-2/FLU/RSV testing.  Fact Sheet for Patients: EntrepreneurPulse.com.au  Fact Sheet for Healthcare Providers: IncredibleEmployment.be  This test is not yet approved or cleared by the [place] FDA and has been authorized for detection and/or diagnosis of SARS-CoV-2 by FDA under an Emergency Use Authorization  (EUA). This EUA will remain in effect (meaning this test can be used) for the duration of the COVID-19 declaration under Section 564(b)(1) of the Act, 21 U.S.C. section 360bbb-3(b)(1), unless the authorization is terminated or revoked.  Performed at [facility], [place] [address], [place],  [address]       Radiology Studies: MR BRAIN WO CONTRAST  Result Date: 07/09/2021 CLINICAL DATA:  Initial evaluation for acute neuro deficit, stroke suspected. EXAM: MRI HEAD WITHOUT CONTRAST TECHNIQUE: Multiplanar, multiecho pulse sequences of the brain and surrounding structures were obtained without intravenous contrast. COMPARISON:  Prior CTs from earlier the same day as well as previous MRI from 06/25/2021 FINDINGS: Brain: Examination mildly degraded by motion artifact. Diffuse prominence of the CSF containing spaces compatible generalized cerebral atrophy. Patchy and confluent T2/FLAIR hyperintensity involving the periventricular and deep white matter both cerebral hemispheres most consistent with chronic microvascular ischemic disease, moderately advanced in nature. Patchy involvement of the pons. There has been interval worsening and/or expansion of previously identified infarct involving the left basal ganglia/corona radiata, increased in size from prior now measuring up to 3.6 cm (series 5, image 39). Some persistent signal loss seen on corresponding ADC map, consistent with acute to early subacute ischemia. No associated hemorrhage or significant regional mass effect. Otherwise, no other new areas of acute ischemia are seen. Gray-white matter differentiation otherwise maintained. No other areas of chronic cortical infarction. No evidence for acute or chronic intracranial hemorrhage. No mass lesion, mass effect, or midline shift. Ventricles stable in size without hydrocephalus. No extra-axial fluid collection. Pituitary gland suprasellar region normal. Midline structures intact. Vascular: Major  intracranial vascular flow voids are maintained. Skull and upper cervical spine: Craniocervical junction within normal limits. Visualized upper cervical spine normal. Bone marrow signal intensity within normal limits. No scalp soft tissue abnormality. Sinuses/Orbits: Patient status post bilateral ocular lens replacement. Globes orbital soft tissues demonstrate no acute finding. Paranasal sinuses are largely clear. No significant mastoid effusion. Inner ear structures grossly normal. Other: None. IMPRESSION: 1. Interval expansion of previously identified left basal ganglia/corona radiata infarct, now measuring up to 3.6 cm. No associated hemorrhage or significant regional mass effect. 2. No other acute intracranial abnormality. 3. Underlying age-related cerebral atrophy with moderate chronic microvascular ischemic disease. Electronically Signed   By: Jeannine Boga M.D.   On: 07/09/2021 01:01   CT HEAD CODE STROKE WO CONTRAST  Result Date: 07/08/2021 CLINICAL DATA:  Code stroke.  Acute neuro deficit.  Aphasia EXAM: CT HEAD WITHOUT CONTRAST TECHNIQUE: Contiguous axial images were obtained from the base of the skull through the vertex without intravenous contrast.  COMPARISON:  CT head 06/24/2021 FINDINGS: Brain: Generalized atrophy. Chronic white matter hypodensity diffusely extending into the basal ganglia bilaterally. Small hypodensity left thalamus unchanged. Negative for acute infarct, hemorrhage, mass. Vascular: Negative for hyperdense vessel Skull: Negative Sinuses/Orbits: Paranasal sinuses clear. Bilateral cataract extraction Other: None ASPECTS ([place] Stroke Program Early CT Score) - Ganglionic level infarction (caudate, lentiform nuclei, internal capsule, insula, M1-M3 cortex): 7 - Supraganglionic infarction (M4-M6 cortex): 3 Total score (0-10 with 10 being normal): 10 IMPRESSION: 1. No acute abnormality. 2. Atrophy and extensive chronic microvascular ischemic changes. 3. ASPECTS is 10  Electronically Signed   By: Franchot Gallo M.D.   On: 07/08/2021 18:37   CT ANGIO HEAD CODE STROKE  Result Date: 07/08/2021 CLINICAL DATA:  Acute neuro deficit.  Aphasia. EXAM: CT ANGIOGRAPHY HEAD AND NECK TECHNIQUE: Multidetector CT imaging of the head and neck was performed using the standard protocol during bolus administration of intravenous contrast. Multiplanar CT image reconstructions and MIPs were obtained to evaluate the vascular anatomy. Carotid stenosis measurements (when applicable) are obtained utilizing NASCET criteria, using the distal internal carotid diameter as the denominator. CONTRAST:  18mL OMNIPAQUE IOHEXOL 350 MG/ML SOLN COMPARISON:  CT head 07/08/2021 FINDINGS: CTA NECK FINDINGS Aortic arch: Atherosclerotic calcification aortic arch. Proximal great vessels widely patent. Right carotid system: Mild atherosclerotic disease right carotid. No significant stenosis. Left carotid system: Mild atherosclerotic disease left carotid. Negative for stenosis Vertebral arteries: Both vertebral arteries widely patent to the skull base without stenosis. Skeleton: No acute abnormality. Other neck: Mild enlargement of the right lobe of the thyroid. Subcentimeter nodule right lower pole. No further imaging required (ref: J Am Coll Radiol. 2015 Feb;12(2): 143-50). Upper chest: Lung apices clear bilaterally. Review of the MIP images confirms the above findings CTA HEAD FINDINGS Anterior circulation: Cavernous carotid widely patent with mild atherosclerotic disease. Mild intracranial atherosclerotic disease with vessel irregularity in the anterior and middle cerebral arteries bilaterally. No significant stenosis. Posterior circulation: Both vertebral arteries patent to the basilar. Basilar widely patent. Posterior cerebral arteries patent bilaterally with mild atherosclerotic irregularity. Negative for large vessel occlusion Venous sinuses: Minimal venous enhancement Anatomic variants: None Review of the MIP  images confirms the above findings IMPRESSION: 1. Negative for intracranial large vessel occlusion 2. 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 3. Minimal atherosclerotic disease in the carotid arteries. No carotid or vertebral artery stenosis in the neck. 4. Code stroke imaging results were communicated on 07/08/2021 at 7:01 pm to provider Lorrin Goodell via text page Electronically Signed   By: Franchot Gallo M.D.   On: 07/08/2021 19:02   CT ANGIO NECK CODE STROKE  Result Date: 07/08/2021 CLINICAL DATA:  Acute neuro deficit.  Aphasia. EXAM: CT ANGIOGRAPHY HEAD AND NECK TECHNIQUE: Multidetector CT imaging of the head and neck was performed using the standard protocol during bolus administration of intravenous contrast. Multiplanar CT image reconstructions and MIPs were obtained to evaluate the vascular anatomy. Carotid stenosis measurements (when applicable) are obtained utilizing NASCET criteria, using the distal internal carotid diameter as the denominator. CONTRAST:  57mL OMNIPAQUE IOHEXOL 350 MG/ML SOLN COMPARISON:  CT head 07/08/2021 FINDINGS: CTA NECK FINDINGS Aortic arch: Atherosclerotic calcification aortic arch. Proximal great vessels widely patent. Right carotid system: Mild atherosclerotic disease right carotid. No significant stenosis. Left carotid system: Mild atherosclerotic disease left carotid. Negative for stenosis Vertebral arteries: Both vertebral arteries widely patent to the skull base without stenosis. Skeleton: No acute abnormality. Other neck: Mild enlargement of the  right lobe of the thyroid. Subcentimeter nodule right lower pole. No further imaging required (ref: J Am Coll Radiol. 2015 Feb;12(2): 143-50). Upper chest: Lung apices clear bilaterally. Review of the MIP images confirms the above findings CTA HEAD FINDINGS Anterior circulation: Cavernous carotid widely patent with mild atherosclerotic  disease. Mild intracranial atherosclerotic disease with vessel irregularity in the anterior and middle cerebral arteries bilaterally. No significant stenosis. Posterior circulation: Both vertebral arteries patent to the basilar. Basilar widely patent. Posterior cerebral arteries patent bilaterally with mild atherosclerotic irregularity. Negative for large vessel occlusion Venous sinuses: Minimal venous enhancement Anatomic variants: None Review of the MIP images confirms the above findings IMPRESSION: 1. Negative for intracranial large vessel occlusion 2. 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 3. Minimal atherosclerotic disease in the carotid arteries. No carotid or vertebral artery stenosis in the neck. 4. Code stroke imaging results were communicated on 07/08/2021 at 7:01 pm to provider Lorrin Goodell via text page Electronically Signed   By: Franchot Gallo M.D.   On: 07/08/2021 19:02       LOS: 0 days   [facility] Pager on [email or web address]  07/09/2021, 8:51 AM

## 2021-07-09 NOTE — Progress Notes (Signed)
PT Cancellation Note  Patient Details Name: Sandra Becker MRN: [id number] DOB: Jul 06, 1941   Cancelled Treatment:    Reason Eval/Treat Not Completed: Patient at procedure or test/unavailable Pt currently having EEG. Will follow up as schedule allows.   Lou Miner, DPT  [facility]  Pager: [phone] Office: [phone]    [name] 07/09/2021, 11:42 AM

## 2021-07-09 NOTE — Progress Notes (Signed)
  Echocardiogram 2D Echocardiogram has been performed.  Sandra Becker 07/09/2021, 10:01 AM

## 2021-07-09 NOTE — ED Notes (Signed)
Pt denies orthostatic symptoms during position changes, BP remains relatively constant despite position changes

## 2021-07-09 NOTE — Procedures (Signed)
Patient Name: Sandra Becker  MRN: [id number]  Epilepsy Attending: Lora Havens  Referring Physician/Provider: Dr Derrick Ravel Date: 07/09/2021 Duration: 24.51 mins  Patient history: 80 year old female with recurrent transient aphasia.  EEG to evaluate for seizure.  Level of alertness: Awake  AEDs during EEG study: None  Technical aspects: This EEG study was done with scalp electrodes positioned according to the 10-20 International system of electrode placement. Electrical activity was acquired at a sampling rate of 500Hz  and reviewed with a high frequency filter of 70Hz  and a low frequency filter of 1Hz . EEG data were recorded continuously and digitally stored.   Description: The posterior dominant rhythm consists of 8 Hz activity of moderate voltage (25-35 uV) seen predominantly in posterior head regions, symmetric and reactive to eye opening and eye closing. EEG showed intermittent 3 to 6 Hz theta-delta slowing and left temporal region. Hyperventilation and photic stimulation were not performed.     ABNORMALITY - Intermittent slow, left temporal region  IMPRESSION: This study is suggestive of cortical dysfunction arising from left temporal region, nonspecific but could be secondary to underlying infarct.  No seizures or epileptiform discharges were seen throughout the recording.  Larrissa Stivers [name]

## 2021-07-09 NOTE — Progress Notes (Addendum)
STROKE TEAM PROGRESS NOTE   ATTENDING NOTE: I reviewed above note and agree with the assessment and plan. Pt was seen and examined.   80 year old female with history of hypertension, hyperlipidemia, breast cancer status post bilateral mastectomy admitted again for slumped over to her walker while standing, worsening speech difficulty.  In 04/2021 patient admitted for right-sided weakness aphasia left gaze and right facial droop.  CT, CTA head and neck, MRI negative.  EEG no seizure.  EF 60 to 65%.  LDL 69, A1c 6.0, patient discharged on DAPT and Crestor 5.  In 05/2021, patient admitted again for aphasia, right-sided arm weakness, and slurred speech.  CT negative.  MRI showed left BG/CR scattered infarcts.  MRA negative.  EEG no seizure.  LDL 75, A1c 5.7.  Discharged to CIR with aspirin Brilinta and Crestor 10.  Patient discharged from CR on 12/10 with mild expressive aphasia.  On 12/11, patient was standing by a dresser with her walker, however she was seen to slumped over to her walker, she was found by her husband with worsening aphasia.  EMS was called and code stroke activated.  Shortly after ER, patient back to her baseline.  No TNKase given due to recent stroke.  CT no acute abnormality.  CTA head and neck unremarkable.  MRI showed some extension of her previous BG/CR infarct, however subacute not new.  EEG no seizure.  Currently has LTM ongoing.  LDL 39 and A1c 5.7.  Creatinine 0.7.  WBC 10.9  On exam, patient still has mild expressive aphasia with intermittent word finding difficulty and paraphasic errors, however able to name and repeat without difficulty.  Otherwise neurologically intact.  Etiology for patient symptoms this time concerning for syncope/presyncope with subsequent recrudescence of her stroke symptoms.  Recommend orthostatic vital to rule out orthostatic hypotension.  Follow-up with LTM to rule out seizure.  Continue aspirin and Brilinta as well as Crestor 10.  We will  follow.  For detailed assessment and plan, please refer to above as I have made changes wherever appropriate.   Rosalin Hawking, MD PhD Stroke Neurology 07/09/2021 8:08 PM    INTERVAL HISTORY No family is at the bedside currently.  Patient has been placed on EEG.  Patient states she remembers falling at home.  She was recently admitted on 04/2021 and 06/24/2021 for an acute onset of slurred speech and right-sided weakness.  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  Patient was discharged on aspirin and Brilinta and it with recommendations for a 30-day cardiac monitor.  She returned to her independent living facility on 12/10 and return to the emergency department on 12/11 due to worsening of her aphasia and right-sided weakness.  Her right-sided weakness seems to have resolved, she is still having hesitancy of speech. Orthostatic vitals are hemodynamically stable and patient denies symptoms with changing in positions.   Vitals:   07/09/21 1300 07/09/21 1323 07/09/21 1328 07/09/21 1330  BP: (!) 172/118 (!) 180/90 (!) 188/107 (!) 176/100  Pulse: 93     Resp: (!) 21     Temp:      TempSrc:      SpO2: 98%     Weight:      Height:       CBC:  Recent Labs  Lab 07/08/21 1815 07/08/21 1821  WBC 10.9*  --   NEUTROABS 7.7  --   HGB 14.2 15.3*  HCT 42.4 45.0  MCV 90.8  --   PLT 414*  --  Basic Metabolic Panel:  Recent Labs  Lab 07/03/21 0512 07/08/21 1815 07/08/21 1821  NA 132* 127* 129*  K 4.3 3.9 3.6  CL 100 94* 95*  CO2 23 22  --   GLUCOSE 102* 145* 146*  BUN 10 15 16   CREATININE 0.53 0.77 0.70  CALCIUM 8.9 9.6  --    Lipid Panel:  Recent Labs  Lab 07/09/21 0504  CHOL 120  TRIG 79  HDL 65  CHOLHDL 1.8  VLDL 16  LDLCALC 39   HgbA1c:  Recent Labs  Lab 07/09/21 0504  HGBA1C 5.7*   Urine Drug Screen:  Recent Labs  Lab 07/08/21 1815  LABOPIA NONE DETECTED  COCAINSCRNUR NONE DETECTED  LABBENZ NONE DETECTED  AMPHETMU NONE DETECTED  THCU  NONE DETECTED  LABBARB NONE DETECTED    Alcohol Level  Recent Labs  Lab 07/08/21 1815  ETH <10    IMAGING past 24 hours MR BRAIN WO CONTRAST  Result Date: 07/09/2021 CLINICAL DATA:  Initial evaluation for acute neuro deficit, stroke suspected. EXAM: MRI HEAD WITHOUT CONTRAST TECHNIQUE: Multiplanar, multiecho pulse sequences of the brain and surrounding structures were obtained without intravenous contrast. COMPARISON:  Prior CTs from earlier the same day as well as previous MRI from 06/25/2021 FINDINGS: Brain: Examination mildly degraded by motion artifact. Diffuse prominence of the CSF containing spaces compatible generalized cerebral atrophy. Patchy and confluent T2/FLAIR hyperintensity involving the periventricular and deep white matter both cerebral hemispheres most consistent with chronic microvascular ischemic disease, moderately advanced in nature. Patchy involvement of the pons. There has been interval worsening and/or expansion of previously identified infarct involving the left basal ganglia/corona radiata, increased in size from prior now measuring up to 3.6 cm (series 5, image 39). Some persistent signal loss seen on corresponding ADC map, consistent with acute to early subacute ischemia. No associated hemorrhage or significant regional mass effect. Otherwise, no other new areas of acute ischemia are seen. Gray-white matter differentiation otherwise maintained. No other areas of chronic cortical infarction. No evidence for acute or chronic intracranial hemorrhage. No mass lesion, mass effect, or midline shift. Ventricles stable in size without hydrocephalus. No extra-axial fluid collection. Pituitary gland suprasellar region normal. Midline structures intact. Vascular: Major intracranial vascular flow voids are maintained. Skull and upper cervical spine: Craniocervical junction within normal limits. Visualized upper cervical spine normal. Bone marrow signal intensity within normal limits.  No scalp soft tissue abnormality. Sinuses/Orbits: Patient status post bilateral ocular lens replacement. Globes orbital soft tissues demonstrate no acute finding. Paranasal sinuses are largely clear. No significant mastoid effusion. Inner ear structures grossly normal. Other: None. IMPRESSION: 1. Interval expansion of previously identified left basal ganglia/corona radiata infarct, now measuring up to 3.6 cm. No associated hemorrhage or significant regional mass effect. 2. No other acute intracranial abnormality. 3. Underlying age-related cerebral atrophy with moderate chronic microvascular ischemic disease. Electronically Signed   By: Jeannine Boga M.D.   On: 07/09/2021 01:01   EEG adult  Result Date: 07/09/2021 Lora Havens, MD     07/09/2021  1:19 PM Patient Name: Sandra Becker MRN: [id number] Epilepsy Attending: Lora Havens Referring Physician/Provider: Dr Derrick Ravel Date: 07/09/2021 Duration: 24.51 mins Patient history: 80 year old female with recurrent transient aphasia.  EEG to evaluate for seizure. Level of alertness: Awake AEDs during EEG study: None Technical aspects: This EEG study was done with scalp electrodes positioned according to the 10-20 International system of electrode placement. Electrical activity was acquired at a sampling rate of 500Hz  and reviewed with  a high frequency filter of 70Hz  and a low frequency filter of 1Hz . EEG data were recorded continuously and digitally stored. Description: The posterior dominant rhythm consists of 8 Hz activity of moderate voltage (25-35 uV) seen predominantly in posterior head regions, symmetric and reactive to eye opening and eye closing. EEG showed intermittent 3 to 6 Hz theta-delta slowing and left temporal region. Hyperventilation and photic stimulation were not performed.   ABNORMALITY - Intermittent slow, left temporal region IMPRESSION: This study is suggestive of cortical dysfunction arising from left temporal  region, nonspecific but could be secondary to underlying infarct.  No seizures or epileptiform discharges were seen throughout the recording. Lora Havens   CT HEAD CODE STROKE WO CONTRAST  Result Date: 07/08/2021 CLINICAL DATA:  Code stroke.  Acute neuro deficit.  Aphasia EXAM: CT HEAD WITHOUT CONTRAST TECHNIQUE: Contiguous axial images were obtained from the base of the skull through the vertex without intravenous contrast. COMPARISON:  CT head 06/24/2021 FINDINGS: Brain: Generalized atrophy. Chronic white matter hypodensity diffusely extending into the basal ganglia bilaterally. Small hypodensity left thalamus unchanged. Negative for acute infarct, hemorrhage, mass. Vascular: Negative for hyperdense vessel Skull: Negative Sinuses/Orbits: Paranasal sinuses clear. Bilateral cataract extraction Other: None ASPECTS ([place] Stroke Program Early CT Score) - Ganglionic level infarction (caudate, lentiform nuclei, internal capsule, insula, M1-M3 cortex): 7 - Supraganglionic infarction (M4-M6 cortex): 3 Total score (0-10 with 10 being normal): 10 IMPRESSION: 1. No acute abnormality. 2. Atrophy and extensive chronic microvascular ischemic changes. 3. ASPECTS is 10 Electronically Signed   By: Franchot Gallo M.D.   On: 07/08/2021 18:37   CT ANGIO HEAD CODE STROKE  Result Date: 07/08/2021 CLINICAL DATA:  Acute neuro deficit.  Aphasia. EXAM: CT ANGIOGRAPHY HEAD AND NECK TECHNIQUE: Multidetector CT imaging of the head and neck was performed using the standard protocol during bolus administration of intravenous contrast. Multiplanar CT image reconstructions and MIPs were obtained to evaluate the vascular anatomy. Carotid stenosis measurements (when applicable) are obtained utilizing NASCET criteria, using the distal internal carotid diameter as the denominator. CONTRAST:  66mL OMNIPAQUE IOHEXOL 350 MG/ML SOLN COMPARISON:  CT head 07/08/2021 FINDINGS: CTA NECK FINDINGS Aortic arch: Atherosclerotic calcification  aortic arch. Proximal great vessels widely patent. Right carotid system: Mild atherosclerotic disease right carotid. No significant stenosis. Left carotid system: Mild atherosclerotic disease left carotid. Negative for stenosis Vertebral arteries: Both vertebral arteries widely patent to the skull base without stenosis. Skeleton: No acute abnormality. Other neck: Mild enlargement of the right lobe of the thyroid. Subcentimeter nodule right lower pole. No further imaging required (ref: J Am Coll Radiol. 2015 Feb;12(2): 143-50). Upper chest: Lung apices clear bilaterally. Review of the MIP images confirms the above findings CTA HEAD FINDINGS Anterior circulation: Cavernous carotid widely patent with mild atherosclerotic disease. Mild intracranial atherosclerotic disease with vessel irregularity in the anterior and middle cerebral arteries bilaterally. No significant stenosis. Posterior circulation: Both vertebral arteries patent to the basilar. Basilar widely patent. Posterior cerebral arteries patent bilaterally with mild atherosclerotic irregularity. Negative for large vessel occlusion Venous sinuses: Minimal venous enhancement Anatomic variants: None Review of the MIP images confirms the above findings IMPRESSION: 1. Negative for intracranial large vessel occlusion 2. 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 3. Minimal atherosclerotic disease in the carotid arteries. No carotid or vertebral artery stenosis in the neck. 4. Code stroke imaging results were communicated on 07/08/2021 at 7:01 pm to provider [facility] via text page Electronically Signed  By: Franchot Gallo M.D.   On: 07/08/2021 19:02   CT ANGIO NECK CODE STROKE  Result Date: 07/08/2021 CLINICAL DATA:  Acute neuro deficit.  Aphasia. EXAM: CT ANGIOGRAPHY HEAD AND NECK TECHNIQUE: Multidetector CT imaging of the head and neck was performed using the standard  protocol during bolus administration of intravenous contrast. Multiplanar CT image reconstructions and MIPs were obtained to evaluate the vascular anatomy. Carotid stenosis measurements (when applicable) are obtained utilizing NASCET criteria, using the distal internal carotid diameter as the denominator. CONTRAST:  7mL OMNIPAQUE IOHEXOL 350 MG/ML SOLN COMPARISON:  CT head 07/08/2021 FINDINGS: CTA NECK FINDINGS Aortic arch: Atherosclerotic calcification aortic arch. Proximal great vessels widely patent. Right carotid system: Mild atherosclerotic disease right carotid. No significant stenosis. Left carotid system: Mild atherosclerotic disease left carotid. Negative for stenosis Vertebral arteries: Both vertebral arteries widely patent to the skull base without stenosis. Skeleton: No acute abnormality. Other neck: Mild enlargement of the right lobe of the thyroid. Subcentimeter nodule right lower pole. No further imaging required (ref: J Am Coll Radiol. 2015 Feb;12(2): 143-50). Upper chest: Lung apices clear bilaterally. Review of the MIP images confirms the above findings CTA HEAD FINDINGS Anterior circulation: Cavernous carotid widely patent with mild atherosclerotic disease. Mild intracranial atherosclerotic disease with vessel irregularity in the anterior and middle cerebral arteries bilaterally. No significant stenosis. Posterior circulation: Both vertebral arteries patent to the basilar. Basilar widely patent. Posterior cerebral arteries patent bilaterally with mild atherosclerotic irregularity. Negative for large vessel occlusion Venous sinuses: Minimal venous enhancement Anatomic variants: None Review of the MIP images confirms the above findings IMPRESSION: 1. Negative for intracranial large vessel occlusion 2. 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 3. Minimal atherosclerotic disease in the carotid arteries. No carotid  or vertebral artery stenosis in the neck. 4. Code stroke imaging results were communicated on 07/08/2021 at 7:01 pm to provider Lorrin Goodell via text page Electronically Signed   By: Franchot Gallo M.D.   On: 07/08/2021 19:02    PHYSICAL EXAM  Temp:  [97.5 F (36.4 C)] 97.5 F (36.4 C) (12/11 1732) Pulse Rate:  [77-103] 93 (12/12 1300) Resp:  [8-24] 21 (12/12 1300) BP: (123-188)/(68-156) 176/100 (12/12 1330) SpO2:  [90 %-100 %] 98 % (12/12 1300) Weight:  [78.2 kg] 78.2 kg (12/11 1734)  General - Well nourished, well developed, in no apparent distress.  Ophthalmologic - fundi not visualized due to noncooperation.  Cardiovascular - Regular rhythm and rate.  Mental Status -  Level of arousal and orientation to time, place, and person were intact. Difficulty with fluidity of speech.  Hesitancy of speech. She did occasionally say the wrong word when asked to repeat.  She needed prompting to slow down in order to speak words correctly.  Recent and remote memory were intact. Fund of Knowledge was assessed and was intact.  Cranial Nerves II - XII - II - Visual field intact OU. III, IV, VI - Extraocular movements intact. V - Facial sensation intact bilaterally. VII - Facial movement intact bilaterally. VIII - Hearing & vestibular intact bilaterally. X - Palate elevates symmetrically. XI - Chin turning & shoulder shrug intact bilaterally. XII - Tongue protrusion intact.  Motor Strength - The patient's strength was normal in all extremities and pronator drift was absent.  Bulk was normal and fasciculations were absent.   Motor Tone - Muscle tone was assessed at the neck and appendages and was normal.  Reflexes - The patient's reflexes were symmetrical  in all extremities and she had no pathological reflexes.  Sensory - Light touch, temperature/pinprick were assessed and were symmetrical.    Coordination - The patient had normal movements in the hands and feet with no ataxia or dysmetria.   Tremor was absent.  Gait and Station - deferred.   ASSESSMENT/PLAN Sandra Becker is a 80 y.o. female with history of hyperlipidemia, hypertension, TIA, glaucoma, and breast cancer presenting with with right upper extremity weakness and difficulty word finding and slurring of her speech.  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 Small vessel disease. Atrophy. ASPECTS 10.    CTA head & neck- Negative for intracranial large vessel occlusion.  Intracranial atherosclerotic disease with mild irregularity of the middle anterior and posterior cerebral arteries bilaterally.  Minimal atherosclerotic disease in the carotid arteries no carotid or vertebral artery stenosis in the neck. MRI interval expansion of previously identified left basal ganglia and corona radiata infarct.  No associated hemorrhage or significant regional mass-effect.  Underlying age-related cerebral atrophy with moderate chronic microvascular ischemic disease. 2D Echo pending LDL 39 HgbA1c 5.7 VTE prophylaxis - SCDS aspirin 81 mg daily and Brilinta (ticagrelor) 90 mg bid prior to admission, now on aspirin 81 mg daily and Brilinta (ticagrelor) 90 mg bid.  Therapy recommendations: Pending Disposition: Pending  Hypertension Home meds: Valsartan Stable Permissive hypertension (OK if < 220/120) but gradually normalize in 5-7 days Long-term BP goal normotensive  Hyperlipidemia Home meds: Crestor 10 mg, resumed in hospital LDL 39, goal < 70 Continue statin at discharge  Other Stroke Risk Factors Advanced Age >/= 50  Hx stroke/TIA Coronary artery disease Migraines  Other Active Problems ?Seizure activity EEG done  Suggestive of cortical dysfunction arising from left temporal region, nonspecific but could be secondary to underlying infarct.  No seizures or epileptiform discharges were seen throughout the  recording. History of TIA 04/2021 admitted for transient right-sided weakness and aphasia, left gaze and right facial droop.  BP elevated on presentation.  CT, CTA head and neck and MRI negative.  EEG no seizure.  EF 60 to 65%.  LDL 69, A1c 6.0.  Discharged on DAPT for 3 weeks and then aspirin alone.  Continued on Crestor 5. History of stroke 05/2021-patient admitted for a stroke work-up.  She had a left basal ganglia and corona radiata infarct.  She was discharged to inpatient rehab and then back to her assisted living facility on 12/10.  This stroke was thought to be secondary to small vessel disease. Discharged on Brilinta and aspirin with instruction to transition to Plavix after 1 month . Planned to follow-up at [facility] in 4 weeks. History of breast cancer status post bilateral mastectomy Hyponatremia Continue to monitor for slow correction- 129 Electrolytes replaced as needed. Repeat labs ordered for by primary team.   Hospital day # 0  Patient seen and examined by NP/APP with MD. MD to update note as needed.   Janine Ores, DNP, FNP-BC [facility] Pager: [phone]  To contact Stroke Continuity provider, please refer to [email or web address] After hours, contact General Neurology

## 2021-07-10 DIAGNOSIS — I639 Cerebral infarction, unspecified: Secondary | ICD-10-CM

## 2021-07-10 DIAGNOSIS — R55 Syncope and collapse: Secondary | ICD-10-CM

## 2021-07-10 LAB — CBC
HCT: 41.2 % (ref 36.0–46.0)
Hemoglobin: 14.4 g/dL (ref 12.0–15.0)
MCH: 30.5 pg (ref 26.0–34.0)
MCHC: 35 g/dL (ref 30.0–36.0)
MCV: 87.3 fL (ref 80.0–100.0)
Platelets: 370 10*3/uL (ref 150–400)
RBC: 4.72 MIL/uL (ref 3.87–5.11)
RDW: 12.8 % (ref 11.5–15.5)
WBC: 10.7 10*3/uL — ABNORMAL HIGH (ref 4.0–10.5)
nRBC: 0 % (ref 0.0–0.2)

## 2021-07-10 LAB — BASIC METABOLIC PANEL
Anion gap: 9 (ref 5–15)
BUN: 9 mg/dL (ref 8–23)
CO2: 26 mmol/L (ref 22–32)
Calcium: 8.9 mg/dL (ref 8.9–10.3)
Chloride: 96 mmol/L — ABNORMAL LOW (ref 98–111)
Creatinine, Ser: 0.5 mg/dL (ref 0.44–1.00)
GFR, Estimated: >60 mL/min (ref >60)
Glucose, Bld: 109 mg/dL — ABNORMAL HIGH (ref 70–99)
Potassium: 3.3 mmol/L — ABNORMAL LOW (ref 3.5–5.1)
Sodium: 131 mmol/L — ABNORMAL LOW (ref 135–145)

## 2021-07-10 MED ORDER — AMLODIPINE BESYLATE 5 MG PO TABS: 5.0000 mg | ORAL_TABLET | Freq: Every day | ORAL | 1 refills | Status: AC

## 2021-07-10 MED ORDER — CHLORTHALIDONE 25 MG PO TABS: 25.0000 mg | ORAL_TABLET | Freq: Every morning | ORAL | Status: DC

## 2021-07-10 MED ORDER — AMLODIPINE BESYLATE 5 MG PO TABS: 5.0000 mg | ORAL_TABLET | Freq: Every day | ORAL | Status: DC

## 2021-07-10 MED ORDER — POTASSIUM CHLORIDE 20 MEQ PO PACK
40.0000 meq | PACK | Freq: Once | ORAL | Status: AC
  Administered 2021-07-10: 40 meq via ORAL
  Filled 2021-07-10: qty 2

## 2021-07-10 NOTE — Plan of Care (Signed)
  Problem: Education: Goal: Knowledge of disease or condition will improve Outcome: Progressing Goal: Knowledge of secondary prevention will improve (SELECT ALL) Outcome: Progressing   

## 2021-07-10 NOTE — Discharge Summary (Addendum)
[facility]  Physician Discharge Summary   Patient ID: [name] MRN: [id number] DOB/AGE: [birth year] 80 y.o.  Admit date: 07/08/2021 Discharge date:   07/10/2021   PCP: Kelton Pillar, MD  DISCHARGE DIAGNOSES:  Recent acute stroke Right-sided weakness, transient, unclear etiology Possible presyncope Hyponatremia, chronic Essential hypertension History of glaucoma  RECOMMENDATIONS FOR OUTPATIENT FOLLOW UP: Chlorthalidone changed over to amlodipine.  Dose may need to be adjusted depending on blood pressure trends. Ambulatory referral was sent to neurology when patient was discharged from patient rehabilitation last week   Home Health: Outpatient physical and Occupational Therapy Equipment/Devices: None  CODE STATUS: Full code  DISCHARGE CONDITION: fair  Diet recommendation: As before  INITIAL HISTORY: 80 y.o. female with medical history significant of TIA, hypertension, hyperlipidemia, prediabetes, glaucoma, breast cancer status post bilateral mastectomy.  Recently admitted on 11/27 for acute onset slurred speech and right-sided weakness.  MRI revealed left basal ganglia infarct.  Felt to be due to small vessel disease versus cardioembolic source.  Recent echo showing EF 60 to 78%, grade 1 diastolic dysfunction, moderate mitral annular calcification, mild to moderate aortic valve sclerosis/calcification.  Recent labs showing A1c 5.7, LDL 75.  Patient was started on aspirin 81 mg daily and Brilinta 90 mg twice daily x30 days, then continue Plavix alone starting 07/25/2021.  Neurology had recommended 30-day cardiac monitor as an outpatient to rule out A. fib.  Patient was discharged to [facility] on 12/1 and returned to her independent living facility on 12/10.  She returned to the ED on 12/11 due to abrupt worsening of her aphasia and right-sided weakness.  Patient was seen by neurology and was hospitalized.     Consultants: Neurology   Procedures: EEG.   Transthoracic echocardiogram   HOSPITAL COURSE:   Right-sided weakness with aphasia in the setting of recent stroke MRI did not reveal any new infarct did show expansion of the previously seen left basal ganglia infarct.  Patient had stroke work-up recently including an echocardiogram which showed normal systolic function with grade 1 diastolic dysfunction.   Reason for presentation is not entirely clear.  Does not appear that she had a new stroke.  Presyncope could have been responsible. Orthostatic vital signs were unremarkable. LDL was 75 during previous admission, now it is 39.  HbA1c 5.7.   Urine drug screen unremarkable.   Patient was on aspirin and Brilinta to be taken till 12/27 following which patient to start taking Plavix. Patient is on Crestor. PT OT SLP evaluation EEG did not show any epileptiform activity.   Discussed with Dr. Erlinda Hong with neurology.  Okay for discharge home today.  Seen by PT and OT and outpatient therapy is recommended.   Hyponatremia This appears to be chronic.  Stable for the most part.  Chlorthalidone was recently discontinued.     Essential hypertension Blood pressure is noted to be poorly controlled.  Continue with ARB.  Amlodipine added.  Will need outpatient follow-up for same.     History of glaucoma Continue with eyedrops.   Prediabetes HbA1c 5.7   Patient is stable.  Discussed with patient's husband.  Okay for discharge home today.   PERTINENT LABS:  The results of significant diagnostics from this hospitalization (including imaging, microbiology, ancillary and laboratory) are listed below for reference.    Microbiology: Recent Results (from the past 240 hour(s))  Resp Panel by RT-PCR (Flu A&B, Covid) Nasopharyngeal Swab     Status: None   Collection Time: 07/08/21  6:54 PM   Specimen:  Nasopharyngeal Swab; Nasopharyngeal(NP) swabs in vial transport medium  Result Value Ref Range Status   SARS Coronavirus 2 by RT PCR NEGATIVE NEGATIVE  Final    Comment: (NOTE) SARS-CoV-2 target nucleic acids are NOT DETECTED.  The SARS-CoV-2 RNA is generally detectable in upper respiratory specimens during the acute phase of infection. The lowest concentration of SARS-CoV-2 viral copies this assay can detect is 138 copies/mL. A negative result does not preclude SARS-Cov-2 infection and should not be used as the sole basis for treatment or other patient management decisions. A negative result may occur with  improper specimen collection/handling, submission of specimen other than nasopharyngeal swab, presence of viral mutation(s) within the areas targeted by this assay, and inadequate number of viral copies(<138 copies/mL). A negative result must be combined with clinical observations, patient history, and epidemiological information. The expected result is Negative.  Fact Sheet for Patients:  EntrepreneurPulse.com.au  Fact Sheet for Healthcare Providers:  IncredibleEmployment.be  This test is no t yet approved or cleared by the [place] FDA and  has been authorized for detection and/or diagnosis of SARS-CoV-2 by FDA under an Emergency Use Authorization (EUA). This EUA will remain  in effect (meaning this test can be used) for the duration of the COVID-19 declaration under Section 564(b)(1) of the Act, 21 U.S.C.section 360bbb-3(b)(1), unless the authorization is terminated  or revoked sooner.       Influenza A by PCR NEGATIVE NEGATIVE Final   Influenza B by PCR NEGATIVE NEGATIVE Final    Comment: (NOTE) The Xpert Xpress SARS-CoV-2/FLU/RSV plus assay is intended as an aid in the diagnosis of influenza from Nasopharyngeal swab specimens and should not be used as a sole basis for treatment. Nasal washings and aspirates are unacceptable for Xpert Xpress SARS-CoV-2/FLU/RSV testing.  Fact Sheet for Patients: EntrepreneurPulse.com.au  Fact Sheet for Healthcare  Providers: IncredibleEmployment.be  This test is not yet approved or cleared by the [place] FDA and has been authorized for detection and/or diagnosis of SARS-CoV-2 by FDA under an Emergency Use Authorization (EUA). This EUA will remain in effect (meaning this test can be used) for the duration of the COVID-19 declaration under Section 564(b)(1) of the Act, 21 U.S.C. section 360bbb-3(b)(1), unless the authorization is terminated or revoked.  Performed at [facility], [place] [address] [place], [place] [address]      Labs:  COVID-19 Labs   Lab Results  Component Value Date   [place] 07/08/2021   [place] NEGATIVE 06/24/2021   [place] NEGATIVE 05/03/2021      Basic Metabolic Panel: Recent Labs  Lab 07/08/21 1815 07/08/21 1821 07/10/21 0222  NA 127* 129* 131*  K 3.9 3.6 3.3*  CL 94* 95* 96*  CO2 22  --  26  GLUCOSE 145* 146* 109*  BUN 15 16 9   CREATININE 0.77 0.70 0.50  CALCIUM 9.6  --  8.9   Liver Function Tests: Recent Labs  Lab 07/08/21 1815  AST 25  ALT 32  ALKPHOS 48  BILITOT 0.8  PROT 6.7  ALBUMIN 3.7    CBC: Recent Labs  Lab 07/08/21 1815 07/08/21 1821 07/10/21 0222  WBC 10.9*  --  10.7*  NEUTROABS 7.7  --   --   HGB 14.2 15.3* 14.4  HCT 42.4 45.0 41.2  MCV 90.8  --  87.3  PLT 414*  --  370     CBG: Recent Labs  Lab 07/08/21 1814  GLUCAP 155*     IMAGING STUDIES MR ANGIO HEAD WO  CONTRAST  Result Date: 06/25/2021 CLINICAL DATA:  Confusion, right facial droop, right-sided weakness, slurred speech, stroke suspected EXAM: MRI HEAD WITHOUT CONTRAST MRA HEAD WITHOUT CONTRAST TECHNIQUE: Multiplanar, multi-echo pulse sequences of the brain and surrounding structures were acquired without intravenous contrast. Angiographic images of the Circle of Willis were acquired using MRA technique without intravenous contrast. COMPARISON:  05/03/2021 MRI head, correlation is also made with 05/03/2021  CTA head FINDINGS: MRI HEAD FINDINGS Brain: Restricted diffusion with ADC correlate in the left basal ganglia (series 2, images 29-32), likely acute infarct. No acute hemorrhage, mass, mass effect, or midline shift. No extra-axial collection or hydrocephalus. Confluent T2 hyperintense signal in the periventricular white matter and pons, likely the sequela of severe chronic small vessel ischemic disease. 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 Vascular: Please see MRA findings below Skull and upper cervical spine: Normal marrow signal. Sinuses/Orbits: No acute or significant finding. Status post bilateral lens replacements. Other: The mastoids are well aerated. MRA HEAD FINDINGS Anterior circulation: Both internal carotid arteries are patent to the termini, with mild narrowing in the right supraclinoid segment but without significant stenosis or other abnormality. A1 segments patent. Normal anterior communicating artery. Anterior cerebral arteries are patent to their distal aspects. No M1 stenosis or occlusion. Normal MCA bifurcations. Distal MCA branches perfused and symmetric. Posterior circulation: Vertebral arteries patent to the vertebrobasilar junction without stenosis. Basilar patent to its distal aspect. Superior cerebellar arteries patent bilaterally. PCAs perfused to their distal aspects without focal stenosis, although the left proximal P2 is diminutive, likely diffuse narrowing. The bilateral posterior communicating arteries are not visualized. Anatomic variants: None significant IMPRESSION: 1. 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 2. No intracranial large vessel occlusion or severe stenosis. Mild diffuse narrowing in the left P2 segment. These results were called by telephone at the time of interpretation on 06/25/2021 at 2:47 am to provider [facility] , who verbally acknowledged these results. Electronically Signed   By: Merilyn Baba M.D.    On: 06/25/2021 02:48   MR BRAIN WO CONTRAST  Result Date: 07/09/2021 CLINICAL DATA:  Initial evaluation for acute neuro deficit, stroke suspected. EXAM: MRI HEAD WITHOUT CONTRAST TECHNIQUE: Multiplanar, multiecho pulse sequences of the brain and surrounding structures were obtained without intravenous contrast. COMPARISON:  Prior CTs from earlier the same day as well as previous MRI from 06/25/2021 FINDINGS: Brain: Examination mildly degraded by motion artifact. Diffuse prominence of the CSF containing spaces compatible generalized cerebral atrophy. Patchy and confluent T2/FLAIR hyperintensity involving the periventricular and deep white matter both cerebral hemispheres most consistent with chronic microvascular ischemic disease, moderately advanced in nature. Patchy involvement of the pons. There has been interval worsening and/or expansion of previously identified infarct involving the left basal ganglia/corona radiata, increased in size from prior now measuring up to 3.6 cm (series 5, image 39). Some persistent signal loss seen on corresponding ADC map, consistent with acute to early subacute ischemia. No associated hemorrhage or significant regional mass effect. Otherwise, no other new areas of acute ischemia are seen. Gray-white matter differentiation otherwise maintained. No other areas of chronic cortical infarction. No evidence for acute or chronic intracranial hemorrhage. No mass lesion, mass effect, or midline shift. Ventricles stable in size without hydrocephalus. No extra-axial fluid collection. Pituitary gland suprasellar region normal. Midline structures intact. Vascular: Major intracranial vascular flow voids are maintained. Skull and upper cervical spine: Craniocervical junction within normal limits. Visualized upper cervical spine normal. Bone marrow signal intensity within  normal limits. No scalp soft tissue abnormality. Sinuses/Orbits: Patient status post bilateral ocular lens replacement.  Globes orbital soft tissues demonstrate no acute finding. Paranasal sinuses are largely clear. No significant mastoid effusion. Inner ear structures grossly normal. Other: None. IMPRESSION: 1. Interval expansion of previously identified left basal ganglia/corona radiata infarct, now measuring up to 3.6 cm. No associated hemorrhage or significant regional mass effect. 2. No other acute intracranial abnormality. 3. Underlying age-related cerebral atrophy with moderate chronic microvascular ischemic disease. Electronically Signed   By: Jeannine Boga M.D.   On: 07/09/2021 01:01   MR BRAIN WO CONTRAST  Result Date: 06/25/2021 CLINICAL DATA:  Confusion, right facial droop, right-sided weakness, slurred speech, stroke suspected EXAM: MRI HEAD WITHOUT CONTRAST MRA HEAD WITHOUT CONTRAST TECHNIQUE: Multiplanar, multi-echo pulse sequences of the brain and surrounding structures were acquired without intravenous contrast. Angiographic images of the Circle of Willis were acquired using MRA technique without intravenous contrast. COMPARISON:  05/03/2021 MRI head, correlation is also made with 05/03/2021 CTA head FINDINGS: MRI HEAD FINDINGS Brain: Restricted diffusion with ADC correlate in the left basal ganglia (series 2, images 29-32), likely acute infarct. No acute hemorrhage, mass, mass effect, or midline shift. No extra-axial collection or hydrocephalus. Confluent T2 hyperintense signal in the periventricular white matter and pons, likely the sequela of severe chronic small vessel ischemic disease. 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 Vascular: Please see MRA findings below Skull and upper cervical spine: Normal marrow signal. Sinuses/Orbits: No acute or significant finding. Status post bilateral lens replacements. Other: The mastoids are well aerated. MRA HEAD FINDINGS Anterior circulation: Both internal carotid arteries are patent to the termini, with mild  narrowing in the right supraclinoid segment but without significant stenosis or other abnormality. A1 segments patent. Normal anterior communicating artery. Anterior cerebral arteries are patent to their distal aspects. No M1 stenosis or occlusion. Normal MCA bifurcations. Distal MCA branches perfused and symmetric. Posterior circulation: Vertebral arteries patent to the vertebrobasilar junction without stenosis. Basilar patent to its distal aspect. Superior cerebellar arteries patent bilaterally. PCAs perfused to their distal aspects without focal stenosis, although the left proximal P2 is diminutive, likely diffuse narrowing. The bilateral posterior communicating arteries are not visualized. Anatomic variants: None significant IMPRESSION: 1. 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 2. No intracranial large vessel occlusion or severe stenosis. Mild diffuse narrowing in the left P2 segment. These results were called by telephone at the time of interpretation on 06/25/2021 at 2:47 am to provider [facility] , who verbally acknowledged these results. Electronically Signed   By: Merilyn Baba M.D.   On: 06/25/2021 02:48   EEG adult  Result Date: 07/09/2021 Lora Havens, MD     07/09/2021  1:19 PM Patient Name: Sandra Becker MRN: [id number] Epilepsy Attending: Lora Havens Referring Physician/Provider: Dr Derrick Ravel Date: 07/09/2021 Duration: 24.51 mins Patient history: 80 year old female with recurrent transient aphasia.  EEG to evaluate for seizure. Level of alertness: Awake AEDs during EEG study: None Technical aspects: This EEG study was done with scalp electrodes positioned according to the 10-20 International system of electrode placement. Electrical activity was acquired at a sampling rate of 500Hz  and reviewed with a high frequency filter of 70Hz  and a low frequency filter of 1Hz . EEG data were recorded continuously and digitally stored. Description: The posterior dominant rhythm  consists of 8 Hz activity of moderate voltage (25-35 uV) seen predominantly in posterior head regions, symmetric and reactive to eye opening  and eye closing. EEG showed intermittent 3 to 6 Hz theta-delta slowing and left temporal region. Hyperventilation and photic stimulation were not performed.   ABNORMALITY - Intermittent slow, left temporal region IMPRESSION: This study is suggestive of cortical dysfunction arising from left temporal region, nonspecific but could be secondary to underlying infarct.  No seizures or epileptiform discharges were seen throughout the recording. Lora Havens   EEG adult  Result Date: 06/25/2021 Lora Havens, MD     06/25/2021 12:56 PM Patient Name: OTIE HEADLEE MRN: [id number] Epilepsy Attending: Lora Havens Referring Physician/Provider: Dr Donnetta Simpers Date: 06/25/2021 Duration: 23.06 mins Patient history: 80 year old female with right upper extremity weakness and word finding difficulty, similar episode in October 2022.  EEG evaluate for seizure. Level of alertness: Awake, asleep AEDs during EEG study: None Technical aspects: This EEG study was done with scalp electrodes positioned according to the 10-20 International system of electrode placement. Electrical activity was acquired at a sampling rate of 500Hz  and reviewed with a high frequency filter of 70Hz  and a low frequency filter of 1Hz . EEG data were recorded continuously and digitally stored. Description: The posterior dominant rhythm consists of 8 Hz activity of moderate voltage (25-35 uV) seen predominantly in posterior head regions, symmetric and reactive to eye opening and eye closing. Sleep was characterized by vertex waves, sleep spindles (12 to 14 Hz), maximal frontocentral region. Hyperventilation and photic stimulation were not performed.   IMPRESSION: This study is within normal limits. No seizures or epileptiform discharges were seen throughout the recording. Priyanka [name]    Overnight EEG with video  Result Date: 07/10/2021 Lora Havens, MD     07/10/2021 10:50 AM Patient Name: Sandra Becker MRN: [id number] Epilepsy Attending: Lora Havens Referring Physician/Provider: Dr Donnetta Simpers Duration: 07/09/2021 1230 to 07/10/2021 [id number]  Patient history: 80 year old female with recurrent transient aphasia.  EEG to evaluate for seizure.  Level of alertness: Awake  AEDs during EEG study: None  Technical aspects: This EEG study was done with scalp electrodes positioned according to the 10-20 International system of electrode placement. Electrical activity was acquired at a sampling rate of 500Hz  and reviewed with a high frequency filter of 70Hz  and a low frequency filter of 1Hz . EEG data were recorded continuously and digitally stored.  Description: The posterior dominant rhythm consists of 8 Hz activity of moderate voltage (25-35 uV) seen predominantly in posterior head regions, symmetric and reactive to eye opening and eye closing. EEG showed intermittent 3 to 6 Hz theta-delta slowing and left temporal region. Hyperventilation and photic stimulation were not performed.  Of note, parts of study were difficult to interpret due to significant electrode artifact.  ABNORMALITY - Intermittent slow, left temporal region  IMPRESSION: This study is suggestive of cortical dysfunction arising from left temporal region, nonspecific but could be secondary to underlying infarct.  No seizures or epileptiform discharges were seen throughout the recording.  Lora Havens   ECHOCARDIOGRAM COMPLETE  Result Date: 07/09/2021    ECHOCARDIOGRAM REPORT   Patient Name:   Sandra Becker Date of Exam: 07/09/2021 Medical Rec #:  [id number]          Height:       65.0 in Accession #:    [id number]         Weight:       172.4 lb Date of Birth:  09/12/1940          BSA:  1.857 m Patient Age:    2 years           BP:           172/75 mmHg Patient Gender: F                  HR:            91 bpm. Exam Location:  Inpatient Procedure: 2D Echo, 3D Echo, Cardiac Doppler and Color Doppler Indications:    Stroke  History:        Patient has prior history of Echocardiogram examinations, most                 recent 05/04/2021. Stroke; Risk Factors:Hypertension and                 Dyslipidemia.  Sonographer:    Roseanna Rainbow RDCS Referring Phys: [id number] [facility]  Sonographer Comments: Technically difficult study due to poor echo windows. Chest wall deformity. IMPRESSIONS  1. Left ventricular ejection fraction, by estimation, is 60 to 65%. The left ventricle has normal function. The left ventricle has no regional wall motion abnormalities. There is mild left ventricular hypertrophy. Left ventricular diastolic parameters are consistent with Grade I diastolic dysfunction (impaired relaxation).  2. Right ventricular systolic function is normal. The right ventricular size is normal. Tricuspid regurgitation signal is inadequate for assessing PA pressure.  3. No evidence of mitral valve regurgitation.  4. The aortic valve is calcified. Aortic valve regurgitation is mild.  5. The inferior vena cava is normal in size with greater than 50% respiratory variability, suggesting right atrial pressure of 3 mmHg. Conclusion(s)/Recommendation(s): Otherwise normal echocardiogram, with minor abnormalities described in the report. FINDINGS  Left Ventricle: Left ventricular ejection fraction, by estimation, is 60 to 65%. The left ventricle has normal function. The left ventricle has no regional wall motion abnormalities. The left ventricular internal cavity size was normal in size. There is  mild left ventricular hypertrophy. Left ventricular diastolic parameters are consistent with Grade I diastolic dysfunction (impaired relaxation). Right Ventricle: The right ventricular size is normal. No increase in right ventricular wall thickness. Right ventricular systolic function is normal. Tricuspid regurgitation signal is  inadequate for assessing PA pressure. Left Atrium: Left atrial size was normal in size. Right Atrium: Right atrial size was normal in size. Pericardium: There is no evidence of pericardial effusion. Mitral Valve: There is mild calcification of the mitral valve leaflet(s). No evidence of mitral valve regurgitation. Tricuspid Valve: The tricuspid valve is grossly normal. Tricuspid valve regurgitation is not demonstrated. Aortic Valve: The aortic valve is calcified. Aortic valve regurgitation is mild. Pulmonic Valve: Pulmonic valve regurgitation is trivial. Aorta: The aortic root and ascending aorta are structurally normal, with no evidence of dilitation. Venous: The inferior vena cava is normal in size with greater than 50% respiratory variability, suggesting right atrial pressure of 3 mmHg. IAS/Shunts: No atrial level shunt detected by color flow Doppler.  LEFT VENTRICLE PLAX 2D LVIDd:         3.90 cm     Diastology LVIDs:         2.50 cm     LV e' medial:    7.07 cm/s LV PW:         1.00 cm     LV E/e' medial:  6.9 LV IVS:        0.90 cm     LV e' lateral:   6.09 cm/s LVOT diam:     2.00 cm  LV E/e' lateral: 8.0 LV SV:         52 LV SV Index:   28 LVOT Area:     3.14 cm                             3D Volume EF: LV Volumes (MOD)           3D EF:        60 % LV vol d, MOD A2C: 40.6 ml LV EDV:       56 ml LV vol d, MOD A4C: 33.8 ml LV ESV:       22 ml LV vol s, MOD A2C: 13.3 ml LV SV:        34 ml LV vol s, MOD A4C: 15.4 ml LV SV MOD A2C:     27.3 ml LV SV MOD A4C:     33.8 ml LV SV MOD BP:      25.7 ml RIGHT VENTRICLE             IVC RV Basal diam:  2.70 cm     IVC diam: 1.60 cm RV S prime:     15.40 cm/s TAPSE (M-mode): 2.2 cm LEFT ATRIUM           Index        RIGHT ATRIUM          Index LA diam:      2.90 cm 1.56 cm/m   RA Area:     9.85 cm LA Vol (A2C): 23.9 ml 12.87 ml/m  RA Volume:   20.40 ml 10.98 ml/m LA Vol (A4C): 16.1 ml 8.67 ml/m  AORTIC VALVE LVOT Vmax:   84.60 cm/s LVOT Vmean:  58.600 cm/s LVOT  VTI:    0.165 m  AORTA Ao Root diam: 2.40 cm Ao Asc diam:  2.70 cm MITRAL VALVE MV Area (PHT): 4.89 cm    SHUNTS MV Decel Time: 155 msec    Systemic VTI:  0.16 m MV E velocity: 48.80 cm/s  Systemic Diam: 2.00 cm MV A velocity: 92.90 cm/s MV E/A ratio:  0.53 Mary Scientist, physiological signed by Phineas Inches Signature Date/Time: 07/09/2021/2:02:45 PM    Final    CT HEAD CODE STROKE WO CONTRAST  Result Date: 07/08/2021 CLINICAL DATA:  Code stroke.  Acute neuro deficit.  Aphasia EXAM: CT HEAD WITHOUT CONTRAST TECHNIQUE: Contiguous axial images were obtained from the base of the skull through the vertex without intravenous contrast. COMPARISON:  CT head 06/24/2021 FINDINGS: Brain: Generalized atrophy. Chronic white matter hypodensity diffusely extending into the basal ganglia bilaterally. Small hypodensity left thalamus unchanged. Negative for acute infarct, hemorrhage, mass. Vascular: Negative for hyperdense vessel Skull: Negative Sinuses/Orbits: Paranasal sinuses clear. Bilateral cataract extraction Other: None ASPECTS ([place] Stroke Program Early CT Score) - Ganglionic level infarction (caudate, lentiform nuclei, internal capsule, insula, M1-M3 cortex): 7 - Supraganglionic infarction (M4-M6 cortex): 3 Total score (0-10 with 10 being normal): 10 IMPRESSION: 1. No acute abnormality. 2. Atrophy and extensive chronic microvascular ischemic changes. 3. ASPECTS is 10 Electronically Signed   By: Franchot Gallo M.D.   On: 07/08/2021 18:37   CT HEAD CODE STROKE WO CONTRAST  Result Date: 06/24/2021 CLINICAL DATA:  Code stroke. Neuro deficit, acute, stroke suspected. Facial droop, slurred speech, right-sided weakness. EXAM: CT HEAD WITHOUT CONTRAST TECHNIQUE: Contiguous axial images were obtained from the base of the skull through the vertex without intravenous contrast. COMPARISON:  Brain MRI  05/03/2021. CT angiogram head/neck 05/03/2021. Head CT 05/03/2021. FINDINGS: Brain: Mild generalized cerebral atrophy.  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 Background moderate/advanced patchy and ill-defined hypoattenuation within the cerebral white matter, nonspecific but compatible chronic small vessel ischemic disease. There is no acute intracranial hemorrhage. No demarcated cortical infarct. No extra-axial fluid collection. No evidence of an intracranial mass. No midline shift. Vascular: No hyperdense vessel.  Atherosclerotic calcifications. Skull: Normal. Negative for fracture or focal lesion. Sinuses/Orbits: Visualized orbits show no acute finding. Mild mucosal thickening within the bilateral ethmoid air cells. Chronic medially displaced fracture deformity of the right lamina papyracea. ASPECTS ([place] Stroke Program Early CT Score) - Ganglionic level infarction (caudate, lentiform nuclei, internal capsule, insula, M1-M3 cortex): 7 - Supraganglionic infarction (M4-M6 cortex): 3 Total score (0-10 with 10 being normal): 10 These results were called by telephone at the time of interpretation on 06/24/2021 at 6:51 pm to provider Dr. Quinn Axe, who verbally acknowledged these results. IMPRESSION: No evidence of acute intracranial abnormality. Redemonstrated chronic lacunar infarcts within the bilateral deep gray nuclei and right cerebellar hemisphere. Moderate/advanced chronic small vessel ischemic changes within the cerebral white matter. Mild generalized cerebral atrophy. Electronically Signed   By: Kellie Simmering D.O.   On: 06/24/2021 18:53   CT ANGIO HEAD CODE STROKE  Result Date: 07/08/2021 CLINICAL DATA:  Acute neuro deficit.  Aphasia. EXAM: CT ANGIOGRAPHY HEAD AND NECK TECHNIQUE: Multidetector CT imaging of the head and neck was performed using the standard protocol during bolus administration of intravenous contrast. Multiplanar CT image reconstructions and MIPs were obtained to evaluate the vascular anatomy. Carotid stenosis measurements (when  applicable) are obtained utilizing NASCET criteria, using the distal internal carotid diameter as the denominator. CONTRAST:  8mL OMNIPAQUE IOHEXOL 350 MG/ML SOLN COMPARISON:  CT head 07/08/2021 FINDINGS: CTA NECK FINDINGS Aortic arch: Atherosclerotic calcification aortic arch. Proximal great vessels widely patent. Right carotid system: Mild atherosclerotic disease right carotid. No significant stenosis. Left carotid system: Mild atherosclerotic disease left carotid. Negative for stenosis Vertebral arteries: Both vertebral arteries widely patent to the skull base without stenosis. Skeleton: No acute abnormality. Other neck: Mild enlargement of the right lobe of the thyroid. Subcentimeter nodule right lower pole. No further imaging required (ref: J Am Coll Radiol. 2015 Feb;12(2): 143-50). Upper chest: Lung apices clear bilaterally. Review of the MIP images confirms the above findings CTA HEAD FINDINGS Anterior circulation: Cavernous carotid widely patent with mild atherosclerotic disease. Mild intracranial atherosclerotic disease with vessel irregularity in the anterior and middle cerebral arteries bilaterally. No significant stenosis. Posterior circulation: Both vertebral arteries patent to the basilar. Basilar widely patent. Posterior cerebral arteries patent bilaterally with mild atherosclerotic irregularity. Negative for large vessel occlusion Venous sinuses: Minimal venous enhancement Anatomic variants: None Review of the MIP images confirms the above findings IMPRESSION: 1. Negative for intracranial large vessel occlusion 2. 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 3. Minimal atherosclerotic disease in the carotid arteries. No carotid or vertebral artery stenosis in the neck. 4. Code stroke imaging results were communicated on 07/08/2021 at 7:01 pm to provider Lorrin Goodell via text page Electronically Signed   By: Franchot Gallo M.D.   On: 07/08/2021 19:02   CT ANGIO NECK CODE STROKE  Result Date: 07/08/2021 CLINICAL DATA:  Acute neuro deficit.  Aphasia. EXAM: CT ANGIOGRAPHY HEAD AND NECK TECHNIQUE: Multidetector CT imaging of the head and neck was performed using the standard protocol during bolus  administration of intravenous contrast. Multiplanar CT image reconstructions and MIPs were obtained to evaluate the vascular anatomy. Carotid stenosis measurements (when applicable) are obtained utilizing NASCET criteria, using the distal internal carotid diameter as the denominator. CONTRAST:  79mL OMNIPAQUE IOHEXOL 350 MG/ML SOLN COMPARISON:  CT head 07/08/2021 FINDINGS: CTA NECK FINDINGS Aortic arch: Atherosclerotic calcification aortic arch. Proximal great vessels widely patent. Right carotid system: Mild atherosclerotic disease right carotid. No significant stenosis. Left carotid system: Mild atherosclerotic disease left carotid. Negative for stenosis Vertebral arteries: Both vertebral arteries widely patent to the skull base without stenosis. Skeleton: No acute abnormality. Other neck: Mild enlargement of the right lobe of the thyroid. Subcentimeter nodule right lower pole. No further imaging required (ref: J Am Coll Radiol. 2015 Feb;12(2): 143-50). Upper chest: Lung apices clear bilaterally. Review of the MIP images confirms the above findings CTA HEAD FINDINGS Anterior circulation: Cavernous carotid widely patent with mild atherosclerotic disease. Mild intracranial atherosclerotic disease with vessel irregularity in the anterior and middle cerebral arteries bilaterally. No significant stenosis. Posterior circulation: Both vertebral arteries patent to the basilar. Basilar widely patent. Posterior cerebral arteries patent bilaterally with mild atherosclerotic irregularity. Negative for large vessel occlusion Venous sinuses: Minimal venous enhancement Anatomic variants: None Review of the MIP images confirms the above findings  IMPRESSION: 1. Negative for intracranial large vessel occlusion 2. 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 3. Minimal atherosclerotic disease in the carotid arteries. No carotid or vertebral artery stenosis in the neck. 4. Code stroke imaging results were communicated on 07/08/2021 at 7:01 pm to provider Lorrin Goodell via text page Electronically Signed   By: Franchot Gallo M.D.   On: 07/08/2021 19:02    DISCHARGE EXAMINATION: Vitals:   07/09/21 2341 07/10/21 0429 07/10/21 0822 07/10/21 1146  BP: (!) 159/81 (!) 169/89 (!) 173/78 140/69  Pulse: 90 84 97 91  Resp: 18 18 14 14   Temp: 98.2 F (36.8 C) 98.1 F (36.7 C) 97.7 F (36.5 C) 97.9 F (36.6 C)  TempSrc: Oral Oral Oral Oral  SpO2: 96% 98% 100% 97%  Weight:      Height:       General appearance: Awake alert.  In no distress Resp: Clear to auscultation bilaterally.  Normal effort Cardio: S1-S2 is normal regular.  No S3-S4.  No rubs murmurs or bruit GI: Abdomen is soft.  Nontender nondistended.  Bowel sounds are present normal.  No masses organomegaly   DISPOSITION: Home  Discharge Instructions     Call MD for:  difficulty breathing, headache or visual disturbances   Complete by: As directed    Call MD for:  extreme fatigue   Complete by: As directed    Call MD for:  persistant dizziness or light-headedness   Complete by: As directed    Call MD for:  persistant nausea and vomiting   Complete by: As directed    Call MD for:  severe uncontrolled pain   Complete by: As directed    Call MD for:  temperature >100.4   Complete by: As directed    Diet - low sodium heart healthy   Complete by: As directed    Discharge instructions   Complete by: As directed    Please be sure to follow-up with your primary care provider.  You should also be seen by a neurologist in the next few weeks.  A referral was made to neurology when you were last here in  the hospital.  Please  get up slowly from a sitting or lying position.  Seek attention immediately if you get dizzy lightheaded or have passing out episodes.  You were cared for by a hospitalist during your hospital stay. If you have any questions about your discharge medications or the care you received while you were in the hospital after you are discharged, you can call the unit and asked to speak with the hospitalist on call if the hospitalist that took care of you is not available. Once you are discharged, your primary care physician will handle any further medical issues. Please note that NO REFILLS for any discharge medications will be authorized once you are discharged, as it is imperative that you return to your primary care physician (or establish a relationship with a primary care physician if you do not have one) for your aftercare needs so that they can reassess your need for medications and monitor your lab values. If you do not have a primary care physician, you can call [phone] for a physician referral.   Increase activity slowly   Complete by: As directed          Allergies as of 07/10/2021       Reactions   Crab [shellfish Allergy] Other (See Comments)   From skin test   Fosamax [alendronate Sodium] Other (See Comments)   Difficult swallowing   Hydrochlorothiazide Other (See Comments)   hyponatremia   Losartan Cough   Raloxifene Other (See Comments)   Unknown reaction   Ace Inhibitors Cough   Reaction to losartan (pt tolerates valsartan)        Medication List     STOP taking these medications    chlorthalidone 25 MG tablet Commonly known as: HYGROTON       TAKE these medications    acetaminophen 325 MG tablet Commonly known as: TYLENOL Take 2 tablets (650 mg total) by mouth every 4 (four) hours as needed for mild pain (or temp > 37.5 C (99.5 F)).   amLODipine 5 MG tablet Commonly known as: NORVASC Take 1 tablet (5 mg total) by mouth daily.    aspirin 81 MG EC tablet Take 1 tablet (81 mg total) by mouth daily for 20 days. Swallow whole.   brinzolamide 1 % ophthalmic suspension Commonly known as: AZOPT Place 1 drop into both eyes 2 (two) times daily.   CALCIUM 500 PO Take 1 tablet by mouth every morning.   clopidogrel 75 MG tablet Commonly known as: Plavix Plavix 75 mg daily beginning 07/26/2021   Fish Oil 1000 MG Cpdr Take 1,000 mg by mouth every morning.   multivitamin with minerals Tabs tablet Take 1 tablet by mouth every morning.   rosuvastatin 10 MG tablet Commonly known as: CRESTOR Take 1 tablet (10 mg total) by mouth daily.   ticagrelor 90 MG Tabs tablet Commonly known as: BRILINTA Take 1 tablet (90 mg total) by mouth 2 (two) times daily for 20 days.   timolol 0.5 % ophthalmic solution Commonly known as: TIMOPTIC Place 1 drop into both eyes 2 (two) times daily.   valsartan 320 MG tablet Commonly known as: DIOVAN Take 1 tablet (320 mg total) by mouth daily.   vitamin C 500 MG tablet Commonly known as: ASCORBIC ACID Take 500 mg by mouth every morning.          Follow-up Information     Kelton Pillar, MD. Schedule an appointment as soon as possible for a visit in 1 week(s).   Specialty: Family Medicine Contact information:  [address] [place] [address] [phone]                 TOTAL DISCHARGE TIME: 35 minutes  [facility] Pager on [email or web address]  07/10/2021, 12:27 PM

## 2021-07-10 NOTE — Progress Notes (Addendum)
STROKE TEAM PROGRESS NOTE   INTERVAL HISTORY Patient seen in room.  PT is at the bedside to walk with the patient.  Husband is also at bedside.  Patient was removed from the EEG this morning.  No seizure activity was noted.  Patient appears to be in good spirits this morning and she is happy to be working with therapy.  Vitals:   07/09/21 2007 07/09/21 2341 07/10/21 0429 07/10/21 0822  BP: (!) 155/78 (!) 159/81 (!) 169/89 (!) 173/78  Pulse: 93 90 84 97  Resp: 19 18 18 14   Temp: 97.7 F (36.5 C) 98.2 F (36.8 C) 98.1 F (36.7 C) 97.7 F (36.5 C)  TempSrc: Oral Oral Oral Oral  SpO2: 98% 96% 98% 100%  Weight:      Height:       CBC:  Recent Labs  Lab 07/08/21 1815 07/08/21 1821 07/10/21 0222  WBC 10.9*  --  10.7*  NEUTROABS 7.7  --   --   HGB 14.2 15.3* 14.4  HCT 42.4 45.0 41.2  MCV 90.8  --  87.3  PLT 414*  --  580    Basic Metabolic Panel:  Recent Labs  Lab 07/08/21 1815 07/08/21 1821 07/10/21 0222  NA 127* 129* 131*  K 3.9 3.6 3.3*  CL 94* 95* 96*  CO2 22  --  26  GLUCOSE 145* 146* 109*  BUN 15 16 9   CREATININE 0.77 0.70 0.50  CALCIUM 9.6  --  8.9    Lipid Panel:  Recent Labs  Lab 07/09/21 0504  CHOL 120  TRIG 79  HDL 65  CHOLHDL 1.8  VLDL 16  LDLCALC 39    HgbA1c:  Recent Labs  Lab 07/09/21 0504  HGBA1C 5.7*    Urine Drug Screen:  Recent Labs  Lab 07/08/21 1815  LABOPIA NONE DETECTED  COCAINSCRNUR NONE DETECTED  LABBENZ NONE DETECTED  AMPHETMU NONE DETECTED  THCU NONE DETECTED  LABBARB NONE DETECTED     Alcohol Level  Recent Labs  Lab 07/08/21 1815  ETH <10     IMAGING past 24 hours EEG adult  Result Date: 07/09/2021 Sandra Havens, MD     07/09/2021  1:19 PM Patient Name: Sandra Becker MRN: [id number] Epilepsy Attending: Lora Becker Referring Physician/Provider: Dr Derrick Ravel Date: 07/09/2021 Duration: 24.51 mins Patient history: 80 year old female with recurrent transient aphasia.  EEG to evaluate for  seizure. Level of alertness: Awake AEDs during EEG study: None Technical aspects: This EEG study was done with scalp electrodes positioned according to the 10-20 International system of electrode placement. Electrical activity was acquired at a sampling rate of 500Hz  and reviewed with a high frequency filter of 70Hz  and a low frequency filter of 1Hz . EEG data were recorded continuously and digitally stored. Description: The posterior dominant rhythm consists of 8 Hz activity of moderate voltage (25-35 uV) seen predominantly in posterior head regions, symmetric and reactive to eye opening and eye closing. EEG showed intermittent 3 to 6 Hz theta-delta slowing and left temporal region. Hyperventilation and photic stimulation were not performed.   ABNORMALITY - Intermittent slow, left temporal region IMPRESSION: This study is suggestive of cortical dysfunction arising from left temporal region, nonspecific but could be secondary to underlying infarct.  No seizures or epileptiform discharges were seen throughout the recording. Sandra Becker   Overnight EEG with video  Result Date: 07/10/2021 Sandra Havens, MD     07/10/2021  8:35 AM Patient Name: Sandra Becker MRN: [id number]  Epilepsy Attending: Lora Becker Referring Physician/Provider: Dr Donnetta Simpers Duration: 07/09/2021 1230 to 07/10/2021 0830  Patient history: 80 year old female with recurrent transient aphasia.  EEG to evaluate for seizure.  Level of alertness: Awake  AEDs during EEG study: None  Technical aspects: This EEG study was done with scalp electrodes positioned according to the 10-20 International system of electrode placement. Electrical activity was acquired at a sampling rate of 500Hz  and reviewed with a high frequency filter of 70Hz  and a low frequency filter of 1Hz . EEG data were recorded continuously and digitally stored.  Description: The posterior dominant rhythm consists of 8 Hz activity of moderate voltage (25-35 uV)  seen predominantly in posterior head regions, symmetric and reactive to eye opening and eye closing. EEG showed intermittent 3 to 6 Hz theta-delta slowing and left temporal region. Hyperventilation and photic stimulation were not performed.  Of note, parts of study were difficult to interpret due to significant electrode artifact.  ABNORMALITY - Intermittent slow, left temporal region  IMPRESSION: This study is suggestive of cortical dysfunction arising from left temporal region, nonspecific but could be secondary to underlying infarct.  No seizures or epileptiform discharges were seen throughout the recording.  Sandra Becker   ECHOCARDIOGRAM COMPLETE  Result Date: 07/09/2021    ECHOCARDIOGRAM REPORT   Patient Name:   Sandra Becker Date of Exam: 07/09/2021 Medical Rec #:  [id number]          Height:       65.0 in Accession #:    [id number]         Weight:       172.4 lb Date of Birth:  05-27-1941          BSA:          1.857 m Patient Age:    79 years           BP:           172/75 mmHg Patient Gender: F                  HR:           91 bpm. Exam Location:  Inpatient Procedure: 2D Echo, 3D Echo, Cardiac Doppler and Color Doppler Indications:    Stroke  History:        Patient has prior history of Echocardiogram examinations, most                 recent 05/04/2021. Stroke; Risk Factors:Hypertension and                 Dyslipidemia.  Sonographer:    Roseanna Rainbow RDCS Referring Phys: [id number] [facility]  Sonographer Comments: Technically difficult study due to poor echo windows. Chest wall deformity. IMPRESSIONS  1. Left ventricular ejection fraction, by estimation, is 60 to 65%. The left ventricle has normal function. The left ventricle has no regional wall motion abnormalities. There is mild left ventricular hypertrophy. Left ventricular diastolic parameters are consistent with Grade I diastolic dysfunction (impaired relaxation).  2. Right ventricular systolic function is normal. The right ventricular  size is normal. Tricuspid regurgitation signal is inadequate for assessing PA pressure.  3. No evidence of mitral valve regurgitation.  4. The aortic valve is calcified. Aortic valve regurgitation is mild.  5. The inferior vena cava is normal in size with greater than 50% respiratory variability, suggesting right atrial pressure of 3 mmHg. Conclusion(s)/Recommendation(s): Otherwise normal echocardiogram, with minor abnormalities described in the report. FINDINGS  Left  Ventricle: Left ventricular ejection fraction, by estimation, is 60 to 65%. The left ventricle has normal function. The left ventricle has no regional wall motion abnormalities. The left ventricular internal cavity size was normal in size. There is  mild left ventricular hypertrophy. Left ventricular diastolic parameters are consistent with Grade I diastolic dysfunction (impaired relaxation). Right Ventricle: The right ventricular size is normal. No increase in right ventricular wall thickness. Right ventricular systolic function is normal. Tricuspid regurgitation signal is inadequate for assessing PA pressure. Left Atrium: Left atrial size was normal in size. Right Atrium: Right atrial size was normal in size. Pericardium: There is no evidence of pericardial effusion. Mitral Valve: There is mild calcification of the mitral valve leaflet(s). No evidence of mitral valve regurgitation. Tricuspid Valve: The tricuspid valve is grossly normal. Tricuspid valve regurgitation is not demonstrated. Aortic Valve: The aortic valve is calcified. Aortic valve regurgitation is mild. Pulmonic Valve: Pulmonic valve regurgitation is trivial. Aorta: The aortic root and ascending aorta are structurally normal, with no evidence of dilitation. Venous: The inferior vena cava is normal in size with greater than 50% respiratory variability, suggesting right atrial pressure of 3 mmHg. IAS/Shunts: No atrial level shunt detected by color flow Doppler.  LEFT VENTRICLE PLAX 2D  LVIDd:         3.90 cm     Diastology LVIDs:         2.50 cm     LV e' medial:    7.07 cm/s LV PW:         1.00 cm     LV E/e' medial:  6.9 LV IVS:        0.90 cm     LV e' lateral:   6.09 cm/s LVOT diam:     2.00 cm     LV E/e' lateral: 8.0 LV SV:         52 LV SV Index:   28 LVOT Area:     3.14 cm                             3D Volume EF: LV Volumes (MOD)           3D EF:        60 % LV vol d, MOD A2C: 40.6 ml LV EDV:       56 ml LV vol d, MOD A4C: 33.8 ml LV ESV:       22 ml LV vol s, MOD A2C: 13.3 ml LV SV:        34 ml LV vol s, MOD A4C: 15.4 ml LV SV MOD A2C:     27.3 ml LV SV MOD A4C:     33.8 ml LV SV MOD BP:      25.7 ml RIGHT VENTRICLE             IVC RV Basal diam:  2.70 cm     IVC diam: 1.60 cm RV S prime:     15.40 cm/s TAPSE (M-mode): 2.2 cm LEFT ATRIUM           Index        RIGHT ATRIUM          Index LA diam:      2.90 cm 1.56 cm/m   RA Area:     9.85 cm LA Vol (A2C): 23.9 ml 12.87 ml/m  RA Volume:   20.40 ml 10.98 ml/m LA Vol (A4C): 16.1 ml 8.67  ml/m  AORTIC VALVE LVOT Vmax:   84.60 cm/s LVOT Vmean:  58.600 cm/s LVOT VTI:    0.165 m  AORTA Ao Root diam: 2.40 cm Ao Asc diam:  2.70 cm MITRAL VALVE MV Area (PHT): 4.89 cm    SHUNTS MV Decel Time: 155 msec    Systemic VTI:  0.16 m MV E velocity: 48.80 cm/s  Systemic Diam: 2.00 cm MV A velocity: 92.90 cm/s MV E/A ratio:  0.53 Landscape architect signed by Phineas Inches Signature Date/Time: 07/09/2021/2:02:45 PM    Final     PHYSICAL EXAM  Temp:  [97.7 F (36.5 C)-98.2 F (36.8 C)] 97.7 F (36.5 C) (12/13 0822) Pulse Rate:  [77-103] 97 (12/13 0822) Resp:  [8-23] 14 (12/13 0822) BP: (137-188)/(72-118) 173/78 (12/13 0822) SpO2:  [92 %-100 %] 100 % (12/13 0822)  General - Well nourished, well developed, in no apparent distress.  Ophthalmologic - fundi not visualized due to noncooperation.  Cardiovascular - Regular rhythm and rate.  Mental Status -  Level of arousal and orientation to time, place, and person were  intact. Difficulty with fluidity of speech.  Hesitancy of speech. She did occasionally say the wrong word when asked to repeat.  She is requiring verbal cues for multistep commands. Fund of Knowledge was assessed and was intact.  Cranial Nerves II - XII - II - Visual field intact OU. III, IV, VI - Extraocular movements intact. V - Facial sensation intact bilaterally. VII - Facial movement intact bilaterally. VIII - Hearing & vestibular intact bilaterally. X - Palate elevates symmetrically. XI - Chin turning & shoulder shrug intact bilaterally. XII - Tongue protrusion intact.  Motor Strength - The patient's strength was normal in all extremities and pronator drift was absent.  Bulk was normal and fasciculations were absent.   Motor Tone - Muscle tone was assessed at the neck and appendages and was normal.  Reflexes - The patient's reflexes were symmetrical in all extremities and she had no pathological reflexes.  Sensory - Light touch, temperature/pinprick were assessed and were symmetrical.    Coordination - The patient had normal movements in the hands and feet with no ataxia or dysmetria.  Tremor was absent.  Gait and Station - deferred.   ASSESSMENT/PLAN Sandra Becker is a 80 y.o. female with history of hyperlipidemia, hypertension, TIA, glaucoma, and breast cancer presenting with with right upper extremity weakness and difficulty word finding and slurring of her speech.  syncope with subsequent recrudescence of previous stroke symptoms  Syncope event prior to admission Orthostatic vital neg Recommend TED hose Avoid standing or sitting too fast or too long BP monitoring at home  Subacute stroke - Evolution of previous left BG/CR infarct likely secondary to small vessel disease source, can not rule out cardioembolic source at this time. Code Stroke CT- head No acute abnormality. Small vessel disease. Atrophy. ASPECTS 10.    CTA head & neck- Negative for intracranial  large vessel occlusion.  Intracranial atherosclerotic disease with mild irregularity of the middle anterior and posterior cerebral arteries bilaterally.  Minimal atherosclerotic disease in the carotid arteries no carotid or vertebral artery stenosis in the neck. MRI interval expansion of previously identified left basal ganglia and corona radiata infarct.  No associated hemorrhage or significant regional mass-effect.  Underlying age-related cerebral atrophy with moderate chronic microvascular ischemic disease. Continue to recommend 30 day cardiac event monitoring as outpt to rule out afib 2D Echo pending LDL 39 HgbA1c 5.7 VTE prophylaxis - SCDS aspirin 81  mg daily and Brilinta (ticagrelor) 90 mg bid prior to admission, now on aspirin 81 mg daily and Brilinta (ticagrelor) 90 mg bid to finish total one month and then plavix alone Therapy recommendations: Outpatient rehab for OT and PT Disposition: Pending  Hypertension Home meds: Valsartan Stable Orthostatic vital negative Long-term BP goal normotensive Recommend TED hose  Hyperlipidemia Home meds: Crestor 10 mg, resumed in hospital LDL 39, goal < 70 Continue statin at discharge  Other Stroke Risk Factors Advanced Age >/= 37  Hx stroke/TIA Coronary artery disease Migraines  Other Active Problems Seizure-like activity EEG done  Suggestive of cortical dysfunction arising from left temporal region, nonspecific but could be secondary to underlying infarct.  No seizures or epileptiform discharges were seen throughout the recording. History of TIA 04/2021 admitted for transient right-sided weakness and aphasia, left gaze and right facial droop.  BP elevated on presentation.  CT, CTA head and neck and MRI negative.  EEG no seizure.  EF 60 to 65%.  LDL 69, A1c 6.0.  Discharged on DAPT for 3 weeks and then aspirin alone.  Continued on Crestor 5. History of stroke 05/2021-patient admitted for a stroke work-up.  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  She was discharged to inpatient rehab and then back to her assisted living facility on 12/10.  This stroke was thought to be secondary to small vessel disease. Discharged on Brilinta and aspirin with instruction to transition to Plavix after 1 month . Planned to follow-up at [facility] in 4 weeks. History of breast cancer status post bilateral mastectomy Hyponatremia Continue to monitor for slow correction- 129 Electrolytes replaced as needed. Repeat labs ordered for by primary team.   Hospital day # 1  Patient seen and examined by NP/APP with MD. MD to update note as needed.   Janine Ores, DNP, FNP-BC [facility] Pager: [phone]  ATTENDING NOTE: I reviewed above note and agree with the assessment and plan. Pt was seen and examined.   Pt seen in room, she is sitting in bed, husband at the bedside. No acute event overnight, LTM EEG no seizure. Pt at her baseline mild expressive aphasia. Continue to recommend 30 day cardiac event monitoring as outpt. Educated pt and husband for home BP monitoring, avoid over exertion or standing/sitting too fast or too long. Recommend TED hose. Continue ASA and brilinta for total 30 days and then plavix alone. Continue crestor 10. PT/OT recommend outpt PT/OT.   For detailed assessment and plan, please refer to above as I have made changes wherever appropriate.   Neurology will sign off. Please call with questions. Pt will follow up with stroke clinic NP at [facility] in about 4 weeks. Thanks for the consult.  Rosalin Hawking, MD PhD Stroke Neurology 07/10/2021 2:59 PM    To contact Stroke Continuity provider, please refer to [email or web address] After hours, contact General Neurology

## 2021-07-10 NOTE — Progress Notes (Signed)
OT Cancellation Note  Patient Details Name: ANESSIA OAKLAND MRN: [id number] DOB: 1941/06/26   Cancelled Treatment:    Reason Eval/Treat Not Completed: Other (comment) (Pt still attached to EEG, per RN EEG should be disconnected this morning. OT evaluation to f/u after removal of EEG.)  Cebert Dettmann A Ezrie Bunyan 07/10/2021, 9:31 AM

## 2021-07-10 NOTE — Procedures (Addendum)
Patient Name: Sandra Becker  MRN: [id number]  Epilepsy Attending: Lora Havens  Referring Physician/Provider: Dr Donnetta Simpers Duration: 07/09/2021 1230 to 07/10/2021 5625   Patient history: 80 year old female with recurrent transient aphasia.  EEG to evaluate for seizure.   Level of alertness: Awake   AEDs during EEG study: None   Technical aspects: This EEG study was done with scalp electrodes positioned according to the 10-20 International system of electrode placement. Electrical activity was acquired at a sampling rate of 500Hz  and reviewed with a high frequency filter of 70Hz  and a low frequency filter of 1Hz . EEG data were recorded continuously and digitally stored.    Description: The posterior dominant rhythm consists of 8 Hz activity of moderate voltage (25-35 uV) seen predominantly in posterior head regions, symmetric and reactive to eye opening and eye closing. EEG showed intermittent 3 to 6 Hz theta-delta slowing and left temporal region. Hyperventilation and photic stimulation were not performed.    Of note, parts of study were difficult to interpret due to significant electrode artifact.   ABNORMALITY - Intermittent slow, left temporal region   IMPRESSION: This study is suggestive of cortical dysfunction arising from left temporal region, nonspecific but could be secondary to underlying infarct.  No seizures or epileptiform discharges were seen throughout the recording.   [name] [name]

## 2021-07-10 NOTE — Therapy (Signed)
Inpatient Rehab Admissions Coordinator:   Per therapy recommendations, patient was screened for CIR candidacy by Clemens Catholic, MS, CCC-SLP. At this time, in the absence of new stroke,  Pt. does not appear to demonstrate medical necessity to justify in hospital rehabilitation/CIR. Additionally, Pt. Discharged from CIR at min assist level on 07/06/21 and was noted to require the same level of assist during AM OT session. OT also recommends outpatient OT. I will not pursue a rehab consult for this Pt.   Recommend other rehab venues to be pursued.  Please contact me with any questions.  Clemens Catholic, [place], [place] Admissions Coordinator  [phone] ([place]) [phone] (office)

## 2021-07-10 NOTE — Progress Notes (Signed)
EEG/. D/C'd atrium notified. No skin breakdown noted.

## 2021-07-10 NOTE — Evaluation (Signed)
Occupational Therapy Evaluation Patient Details Name: Sandra Becker MRN: [id number] DOB: [birth year] Today's Date: 07/10/2021   History of Present Illness Pt is an 80 y/o female with recent admission for CVA 11/27. She d/c home from [facility] on 12/10 and presented back to ED that same day with exacerbation of stroke symptoms. PMH includes breast cancer s/p bilateral mastectomy, glaucoma, HTN, and TIA.   Clinical Impression   [name] was evaluated s/p the above admission list; pt's husband reports that she was been needing minimal assistance at home since her recent discharge from [facility]. Assistance is largely for safety with ambulation and transfers as well as bathing. They both report she is currently at her previous baseline. She benefits from min A for all mobility and ADLs for verbal cues and physical assist. She is limited by impaired cognition, generalized weakness, R inattention, and poor balance. She would benefit from OT acutely however she has active d/c orders to home. Recommend neuro OP OT.     Recommendations for follow up therapy are one component of a multi-disciplinary discharge planning process, led by the attending physician.  Recommendations may be updated based on patient status, additional functional criteria and insurance authorization.   Follow Up Recommendations  Outpatient OT    Assistance Recommended at Discharge Frequent or constant Supervision/Assistance  Functional Status Assessment  Patient has had a recent decline in their functional status and demonstrates the ability to make significant improvements in function in a reasonable and predictable amount of time.  Equipment Recommendations  Tub/shower seat       Precautions / Restrictions Precautions Precautions: Fall Precaution Comments: slight right inattention, right hemi paresis, impulsive Restrictions Weight Bearing Restrictions: No      Mobility Bed Mobility Overal bed mobility: Needs Assistance Bed  Mobility: Supine to Sit;Sit to Supine     Supine to sit: Min guard Sit to supine: Min assist   General bed mobility comments: Pt to long sit unassisted but increased time required to scoot to EOB and cues for sequencing. Pt needs mod cues for technique with return to supine and use of rail    Transfers Overall transfer level: Needs assistance Equipment used: Rolling walker (2 wheels);None Transfers: Sit to/from Stand Sit to Stand: Min assist           General transfer comment: poor eccentric control and  decreased problem solving  and awareness of "plopping" when sitting down. Ignores cues for safe hand placement or does not register them in time due to slow processing.      Balance Overall balance assessment: Needs assistance Sitting-balance support: Feet supported;No upper extremity supported Sitting balance-Leahy Scale: Fair Sitting balance - Comments: mild posterior LOB while scooting but mostly Supervision for static sitting and weight shifting at EOB   Standing balance support: Single extremity supported;During functional activity Standing balance-Leahy Scale: Poor Standing balance comment: Reliant on at least 1 UE support but does better with BUE on RW for dynamic tasks                   [employer] Sit to Stand: Able to stand  independently using hands Standing Unsupported: Able to stand safely 2 minutes Sitting with Back Unsupported but Feet Supported on Floor or Stool: Able to sit safely and securely 2 minutes Stand to Sit: Controls descent by using hands Transfers: Able to transfer safely, minor use of hands Standing Unsupported with Eyes Closed: Able to stand 10 seconds safely Standing Ubsupported with Feet Together: Able to  place feet together independently and stand 1 minute safely From Standing, Reach Forward with Outstretched Arm: Can reach forward >12 cm safely (5") From Standing Position, Pick up Object from Floor: Able to pick up shoe safely  and easily From Standing Position, Turn to Look Behind Over each Shoulder: Looks behind from both sides and weight shifts well Turn 360 Degrees: Able to turn 360 degrees safely but slowly Standing Unsupported, Alternately Place Feet on Step/Stool: Able to complete >2 steps/needs minimal assist Standing Unsupported, One Foot in Front: Able to take small step independently and hold 30 seconds Standing on One Leg: Tries to lift leg/unable to hold 3 seconds but remains standing independently Total Score: 43       ADL either performed or assessed with clinical judgement   ADL Overall ADL's : Needs assistance/impaired Eating/Feeding: Set up;Sitting   Grooming: Minimal assistance;Sitting   Upper Body Bathing: Minimal assistance;Sitting   Lower Body Bathing: Minimal assistance;Sit to/from stand   Upper Body Dressing : Minimal assistance;Sitting   Lower Body Dressing: Minimal assistance;Sit to/from stand   Toilet Transfer: Minimal assistance;Ambulation;Regular Toilet;Grab bars   Toileting- Clothing Manipulation and Hygiene: Supervision/safety;Sit to/from stand       Functional mobility during ADLs: Minimal assistance General ADL Comments: min A for phsycial assist & verbal cues     Vision Baseline Vision/History: 1 Wears glasses Ability to See in Adequate Light: 0 Adequate Patient Visual Report: No change from baseline Vision Assessment?: No apparent visual deficits            Pertinent Vitals/Pain Pain Assessment: No/denies pain Faces Pain Scale: No hurt     Hand Dominance Right   Extremity/Trunk Assessment Upper Extremity Assessment Upper Extremity Assessment: RUE deficits/detail;Generalized weakness RUE Deficits / Details: apparent motor sensory processing deficits; decreased in-hand manipulation skills RUE Sensation: decreased light touch RUE Coordination: decreased fine motor   Lower Extremity Assessment Lower Extremity Assessment: Defer to PT evaluation LLE  Deficits / Details: L hip deficits at baseline   Cervical / Trunk Assessment Cervical / Trunk Assessment:  (herniated disc at baseline)   Communication Communication Communication: Expressive difficulties   Cognition Arousal/Alertness: Awake/alert Behavior During Therapy: Flat affect Overall Cognitive Status: Impaired/Different from baseline Area of Impairment: Problem solving;Following commands;Safety/judgement;Attention;Awareness;Memory                   Current Attention Level: Sustained Memory: Decreased short-term memory Following Commands: Follows one step commands with increased time Safety/Judgement: Decreased awareness of deficits;Decreased awareness of safety Awareness: Emergent Problem Solving: Slow processing;Difficulty sequencing;Requires verbal cues;Decreased initiation General Comments: R inattention. difficulty with wayfinding, able to state city but can't state specific location/hospital even with hints.     General Comments  VSS on RA, husband present and supportive    Exercises Exercises: Other exercises Other Exercises Other Exercises: supine BLE AROM: heel slides, hip abduction, SLR x5 reps ea   Shoulder Instructions      Home Living Family/patient expects to be discharged to:: Private residence Living Arrangements: Spouse/significant other Available Help at Discharge: Available 24 hours/day;Family Type of Home: Independent living facility Home Access: Elevator     Home Layout: One level     Bathroom Shower/Tub: Occupational psychologist: Standard Bathroom Accessibility: Yes How Accessible: Accessible via walker;Accessible via wheelchair Home Equipment: [place] (2 wheels);Wheelchair - manual (have access to RW and WC [facility])   Additional Comments: outpatient. Lives ILF at [facility] for 4 years  Lives With: Spouse    Prior Functioning/Environment  Prior Level of Function : Independent/Modified Independent   Cognitive Assist : ADLs (cognitive)   ADLs (Cognitive): Intermittent cues Physical Assist : Mobility (physical);ADLs (physical) Mobility (physical): Transfers;Gait ADLs (physical): Bathing;Grooming;Dressing;Toileting;IADLs Mobility Comments: was going ot outpt PT; driving; enjoys water aerobics ADLs Comments: Husband assists with shower transers and dryign after shower. Pt is able to dress with set up A. Requires cues for problem solving, multitasking and safety        OT Problem List: Decreased strength;Decreased activity tolerance;Impaired balance (sitting and/or standing);Impaired vision/perception;Decreased coordination;Decreased cognition;Decreased safety awareness;Decreased knowledge of use of DME or AE;Impaired UE functional use      OT Treatment/Interventions: Self-care/ADL training;Therapeutic exercise;Neuromuscular education;DME and/or AE instruction;Therapeutic activities;Cognitive remediation/compensation;Visual/perceptual remediation/compensation;Patient/family education;Balance training    OT Goals(Current goals can be found in the care plan section) Acute Rehab OT Goals Patient Stated Goal: home today OT Goal Formulation: With patient/family Time For Goal Achievement: 07/24/21 Potential to Achieve Goals: Good ADL Goals Pt Will Perform Lower Body Dressing: with modified independence;sit to/from stand Pt Will Perform Tub/Shower Transfer: with supervision;ambulating;shower seat Additional ADL Goal #1: pt will independently complete a 3 step functional trail making task  OT Frequency: Min 2X/week    AM-PAC OT "6 Clicks" Daily Activity     Outcome Measure Help from another person eating meals?: A Little Help from another person taking care of personal grooming?: A Little Help from another person toileting, which includes using toliet, bedpan, or urinal?: A Little Help from another person bathing (including washing, rinsing, drying)?: A Little Help from another person to  put on and taking off regular upper body clothing?: A Little Help from another person to put on and taking off regular lower body clothing?: A Little 6 Click Score: 18   End of Session Equipment Utilized During Treatment: Gait belt Nurse Communication: Mobility status  Activity Tolerance: Patient tolerated treatment well Patient left: in bed;with bed alarm set;with call bell/phone within reach;with family/visitor present  OT Visit Diagnosis: Unsteadiness on feet (R26.81);Other abnormalities of gait and mobility (R26.89);Muscle weakness (generalized) (M62.81);Other symptoms and signs involving cognitive function                Time: 6384-5364 OT Time Calculation (min): 27 min Charges:  OT General Charges $OT Visit: 1 Visit OT Evaluation $OT Eval Moderate Complexity: 1 Mod OT Treatments $Self Care/Home Management : 8-22 mins    Shawni Volkov A Arch Methot 07/10/2021, 12:38 PM

## 2021-07-10 NOTE — TOC Transition Note (Signed)
Transition of Care [facility]) - CM/SW Discharge Note   Patient Details  Name: Sandra Becker MRN: [id number] Date of Birth: 1940-08-19  Transition of [facility]) CM/SW Contact:  Geralynn Ochs, LCSW Phone Number: 07/10/2021, 2:01 PM   Clinical Narrative:   CSW met with patient and spouse at bedside, had previously been referred to [facility] after [facility] admit but had not scheduled appointment yet. CSW sent referral information again and placed contact information for patient/spouse to call and schedule appointments when ready. Patient already has RW and spouse will be getting a shower seat on his own. No other needs identified.     Final next level of care: OP Rehab Barriers to Discharge: Barriers Resolved   Patient Goals and CMS Choice Patient states their goals for this hospitalization and ongoing recovery are:: to get home CMS Medicare.gov Compare Post Acute Care list provided to:: Patient Choice offered to / list presented to : Patient  Discharge Placement                Patient to be transferred to facility by: Family car Name of family member notified: Spouse at bedside Patient and family notified of of transfer: 07/10/21  Discharge Plan and Services                                     Social Determinants of Health ([place]) Interventions     Readmission Risk Interventions No flowsheet data found.

## 2021-07-10 NOTE — Evaluation (Addendum)
Physical Therapy Evaluation Patient Details Name: Sandra Becker MRN: [id number] DOB: [birth year] Today's Date: 07/10/2021  History of Present Illness  Pt is an 80 y/o female with recent admission for CVA 11/27. She d/c home from [facility] on 12/10 and presented back to ED that same day with exacerbation of stroke symptoms. CT negative for acute changes, and EEG negative for seixures. PMH includes breast cancer s/p bilateral mastectomy, glaucoma, HTN, and TIA.   Clinical Impression  Pt admitted with above diagnosis. Pt currently with functional limitations due to the deficits listed below (see PT Problem List). At the time of PT eval pt was able to perform transfers and ambulation with up to occasional min assist for balance support and walker management. Husband present in room and reports that pt is currently at the same level as she was when she returned home from [facility] stay. Feel pt is appropriate for outpatient PT follow up at d/c to address strength, balance, and cognitive deficits, as well as to improve overall tolerance for functional activity and safety within the home environment. Acutely, pt will benefit from skilled PT to increase their independence and safety with mobility to allow discharge to the venue listed below.          Recommendations for follow up therapy are one component of a multi-disciplinary discharge planning process, led by the attending physician.  Recommendations may be updated based on patient status, additional functional criteria and insurance authorization.  Follow Up Recommendations Outpatient PT    Assistance Recommended at Discharge Frequent or constant Supervision/Assistance  Functional Status Assessment Patient has had a recent decline in their functional status and demonstrates the ability to make significant improvements in function in a reasonable and predictable amount of time.  Equipment Recommendations  None recommended by PT    Recommendations for Other  Services       Precautions / Restrictions Precautions Precautions: Fall Precaution Comments: slight right inattention, right hemi paresis, impulsive Restrictions Weight Bearing Restrictions: No      Mobility  Bed Mobility Overal bed mobility: Needs Assistance Bed Mobility: Supine to Sit;Sit to Supine     Supine to sit: Supervision Sit to supine: Min guard   General bed mobility comments: no assist required for any aspects of bed mobility, however increased time required.    Transfers Overall transfer level: Needs assistance Equipment used: Rolling walker (2 wheels) Transfers: Sit to/from Stand Sit to Stand: Min assist           General transfer comment: poor eccentric control and  decreased problem solving  and awareness of "plopping" when sitting down. Ignores cues for safe hand placement or does not register them in time due to slow processing.    Ambulation/Gait Ambulation/Gait assistance: Min assist Gait Distance (Feet): 300 Feet Assistive device: Rolling walker (2 wheels) Gait Pattern/deviations: Step-through pattern;Decreased stride length;Decreased dorsiflexion - right;Drifts right/left Gait velocity: Decreased Gait velocity interpretation: 1.31 - 2.62 ft/sec, indicative of limited community ambulator   General Gait Details: Cues for wayfinding throughout. Easily distracted by hallway activity, however able to hold a conversation and answer questions accurately about home set up. VC's throughout for improved posture and closer walker proximity.  Stairs            Wheelchair Mobility    Modified Rankin (Stroke Patients Only) Modified Rankin (Stroke Patients Only) Pre-Morbid Rankin Score: Moderately severe disability Modified Rankin: Moderately severe disability     Balance Overall balance assessment: Needs assistance Sitting-balance support: Feet supported;No upper extremity  supported Sitting balance-Leahy Scale: Fair   Postural control:  Posterior lean Standing balance support: Single extremity supported;During functional activity Standing balance-Leahy Scale: Poor Standing balance comment: Reliant on at least 1 UE support but does better with BUE on RW for dynamic tasks                             Pertinent Vitals/Pain Pain Assessment: No/denies pain Faces Pain Scale: No hurt    Home Living Family/patient expects to be discharged to:: Private residence Living Arrangements: Spouse/significant other Available Help at Discharge: Available 24 hours/day;Family Type of Home: Independent living facility Home Access: Elevator       Home Layout: One level Home Equipment: Conservation officer, [employer] (2 wheels);Wheelchair - manual (have [facility] RW and WC [facility]) Additional Comments: Lives ILF at [facility] for 4 years    Prior Function Prior Level of Function : Needs assist  Cognitive Assist : ADLs (cognitive)   ADLs (Cognitive): Intermittent cues Physical Assist : Mobility (physical);ADLs (physical) Mobility (physical): Gait;Transfers ADLs (physical): Bathing;Grooming;Dressing;Toileting;IADLs Mobility Comments: Pt was furniture walking & sometimes using the RW. Husband provides supervision for all ambulation and transfers for safety ADLs Comments: Husband assists with shower transfers and dryign after shower. Pt is able to dress with set up A. Requires cues for problem solving, multitasking and safety     Hand Dominance   Dominant Hand: Right    Extremity/Trunk Assessment   Upper Extremity Assessment Upper Extremity Assessment: Defer to OT evaluation RUE Deficits / Details: some inattention noted. poor in-hand manipulation. generally weak. slow and deliberate for thumb to finger and finger to nose coordiatnion exercises. RUE Sensation: decreased light touch RUE Coordination: decreased fine motor    Lower Extremity Assessment Lower Extremity Assessment: RLE deficits/detail RLE Deficits / Details:  Decreased strength and mild coordination deficits noted. Grossly 4/5 strength in quads/hamstrings, with 3/5 strength in hip flexors. RLE Sensation: WNL RLE Coordination: decreased gross motor LLE Deficits / Details: L hip deficits at baseline    Cervical / Trunk Assessment Cervical / Trunk Assessment:  (herniated disc at baseline)  Communication   Communication: Expressive difficulties  Cognition Arousal/Alertness: Awake/alert Behavior During Therapy: Flat affect Overall Cognitive Status: Impaired/Different from baseline Area of Impairment: Problem solving;Following commands;Safety/judgement;Attention;Awareness;Memory                   Current Attention Level: Sustained Memory: Decreased short-term memory Following Commands: Follows one step commands with increased time Safety/Judgement: Decreased awareness of deficits;Decreased awareness of safety Awareness: Emergent Problem Solving: Slow processing;Difficulty sequencing;Requires verbal cues;Decreased initiation General Comments: R inattention. difficulty with wayfinding, easily distracted.        General Comments General comments (skin integrity, edema, etc.): VSS on RA, husband present and supportive    Exercises     Assessment/Plan    PT Assessment Patient needs continued PT services  PT Problem List Decreased strength;Decreased activity tolerance;Decreased balance;Decreased mobility;Decreased cognition;Decreased coordination;Decreased knowledge of use of DME;Decreased safety awareness;Decreased knowledge of precautions       PT Treatment Interventions DME instruction;Gait training;Functional mobility training;Therapeutic activities;Therapeutic exercise;Balance training;Patient/family education    PT Goals (Current goals can be found in the Care Plan section)  Acute Rehab PT Goals Patient Stated Goal: to go home PT Goal Formulation: With patient/family Time For Goal Achievement: 07/17/21 Potential to Achieve  Goals: Good    Frequency Min 3X/week   Barriers to discharge        Co-evaluation  AM-PAC PT "6 Clicks" Mobility  Outcome Measure Help needed turning from your back to your side while in a flat bed without using bedrails?: A Little Help needed moving from lying on your back to sitting on the side of a flat bed without using bedrails?: A Little Help needed moving to and from a bed to a chair (including a wheelchair)?: A Little Help needed standing up from a chair using your arms (e.g., wheelchair or bedside chair)?: A Little Help needed to walk in hospital room?: A Little Help needed climbing 3-5 steps with a railing? : A Lot 6 Click Score: 17    End of Session Equipment Utilized During Treatment: Gait belt Activity Tolerance: Patient tolerated treatment well Patient left: in bed;with call bell/phone within reach;with bed alarm set Nurse Communication: Mobility status PT Visit Diagnosis: Unsteadiness on feet (R26.81);Muscle weakness (generalized) (M62.81);Other symptoms and signs involving the nervous system (R29.898)    Time: 1111-1140 PT Time Calculation (min) (ACUTE ONLY): 29 min   Charges:   PT Evaluation $PT Eval Moderate Complexity: 1 Mod PT Treatments $Gait Training: 8-22 mins        Rolinda Roan, PT, DPT Acute Rehabilitation Services Pager: [phone] Office: [place] 07/10/2021, 1:32 PM

## 2021-07-18 ENCOUNTER — Other Ambulatory Visit: Payer: Self-pay

## 2021-07-18 ENCOUNTER — Encounter: Payer: Medicare Other | Attending: Registered Nurse | Admitting: Registered Nurse

## 2021-07-18 ENCOUNTER — Encounter: Payer: Self-pay | Admitting: Registered Nurse

## 2021-07-18 VITALS — BP 158/71 | HR 102 | Temp 97.8°F | Ht 65.0 in

## 2021-07-18 DIAGNOSIS — I639 Cerebral infarction, unspecified: Secondary | ICD-10-CM | POA: Diagnosis not present

## 2021-07-18 DIAGNOSIS — I10 Essential (primary) hypertension: Secondary | ICD-10-CM | POA: Diagnosis not present

## 2021-07-18 DIAGNOSIS — E785 Hyperlipidemia, unspecified: Secondary | ICD-10-CM | POA: Diagnosis not present

## 2021-07-18 NOTE — Progress Notes (Signed)
Subjective:    Patient ID: Sandra Becker, female    DOB: [birth year], 80 y.o.   MRN: [id number]  HPI: Sandra Becker is a 80 y.o. female who is here for [place] appointment for follow up of her  Infarction of left basal ganglia, Essential Hypertension and Dyslipidemia. She presented to [facility] on 06/24/2021 via EMS, with complaints with acute onset of right sided weakness and slurred speech.  H&P: Dr Sandra Becker on 06/24/2021 HPI: Sandra Becker is a 80 y.o. female with medical history significant for HTN, glaucoma, TIA, HLD, breast cancer s/p bilateral mastectomy presents for dilation of sudden onset of slurred speech with right-sided weakness.  She reports that she was sitting at home with her husband when she developed slurred speech that was difficult for her to get the words out and she had some right-sided weakness.  Husband called 68.  Symptoms resolved while in the ambulance about 30 minutes after they started.  She denies having any headache, chest pain, palpitations, nausea vomiting diarrhea or abdominal pain.  She was not doing any type of exertion when the symptoms began.  She has not had any change in her diet.  He denies any swelling of her legs.  She been taking her medications as prescribed.  Symptoms are completely resolved at this time.  She denies any visual change with the symptoms. Lives with her husband.  No tobacco, alcohol, illicit drug use. Neurology was consulted. Continue Aspirin, Brilinta x 30 days  then Plavix alone.   CT Head: WO Contrast: IMPRESSION: No evidence of acute intracranial abnormality.   Redemonstrated chronic lacunar infarcts within the bilateral deep gray nuclei and right cerebellar hemisphere.   Moderate/advanced chronic small vessel ischemic changes within the cerebral white matter.   Mild generalized cerebral atrophy.  MR Brain: Angio: IMPRESSION: 1. 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 2. No intracranial large vessel occlusion or  severe stenosis. Mild diffuse narrowing in the left P2 segment.   Sandra Becker was admitted to inpatient rehabilitation  on 06/28/2021 and was discharged home on 07/07/2021. She is receiving outpatient [facility]. She denies any pain. She rates her pain 0. Also reports she has a good appetite.   Pain Inventory Average Pain 5 Pain Right Now 0 My pain is sharp and dull  LOCATION OF PAIN  hip  BOWEL Number of stools per week: 7   BLADDER Normal and Pads    Mobility walk without assistance use a walker do you drive?  no use a wheelchair Do you have any goals in this area?  yes  Function retired I need assistance with the following:  feeding, dressing, bathing, toileting, meal prep, household duties, and shopping Do you have any goals in this area?  yes  Neuro/Psych bladder control problems bowel control problems weakness trouble walking confusion  Prior Studies Any changes since last visit?  no  Physicians involved in your care Any changes since last visit?  no   Family History  Problem Relation Age of Onset   Colon cancer Neg Hx    Social History   Socioeconomic History   Marital status: Married    Spouse name: Not on file   Number of children: Not on file   Years of education: Not on file   Highest education level: Not on file  Occupational History   Not on file  Tobacco Use   Smoking status: Former    Types: Cigarettes    Quit date: 07/30/1963  Years since quitting: 58.0   Smokeless tobacco: Never  Substance and Sexual Activity   Alcohol use: Yes    Alcohol/week: 21.0 standard drinks    Types: 21 Glasses of wine per week   Drug use: No   Sexual activity: Not on file  Other Topics Concern   Not on file  Social History Narrative   Not on file   Social Determinants of Health   Financial Resource Strain: Not on file  Food Insecurity: Not on file  Transportation Needs: Not on file  Physical  Activity: Not on file  Stress: Not on file  Social Connections: Not on file   Past Surgical History:  Procedure Laterality Date   MASTECTOMY Bilateral [place]   Past Medical History:  Diagnosis Date   Breast cancer ([place]) 1993   bilateral mastectomy; no chemo or radiation   Glaucoma    Hyperlipidemia    Hypertension    BP (!) 158/71    Pulse (!) 102    Temp 97.8 F (36.6 C)    Ht 5\' 5"  (1.651 m)    SpO2 98%    BMI 28.69 kg/m   Opioid Risk Score:   Fall Risk Score:  `1  Depression screen PHQ 2/9  No flowsheet data found.   Review of Systems  Constitutional: Negative.   HENT: Negative.    Eyes: Negative.   Respiratory: Negative.    Cardiovascular: Negative.   Gastrointestinal: Negative.   Endocrine: Negative.   Genitourinary:  Positive for difficulty urinating.  Musculoskeletal: Negative.   Skin: Negative.   Allergic/Immunologic: Negative.   Neurological:  Positive for weakness.  Hematological: Negative.   Psychiatric/Behavioral:  Positive for confusion.       Objective:   Physical Exam Vitals and nursing note reviewed.  Constitutional:      Appearance: Normal appearance.  Cardiovascular:     Rate and Rhythm: Normal rate and regular rhythm.     Pulses: Normal pulses.     Heart sounds: Normal heart sounds.  Pulmonary:     Effort: Pulmonary effort is normal.     Breath sounds: Normal breath sounds.  Musculoskeletal:     Cervical back: Normal range of motion and neck supple.     Comments: Normal Muscle Bulk and Muscle Testing Reveals:  Upper Extremities: Full ROM and Muscle Strength 5/5  Lower Extremities: Right: Decreased ROM and Muscle Strength 5/5 Left Lower Extremity: Full ROM and Muscle Strength 5/5 Arrived in wheelchair    Skin:    General: Skin is warm and dry.  Neurological:     Mental Status: She is alert and oriented to person, place, and time.  Psychiatric:        Mood and  Affect: Mood normal.        Behavior: Behavior normal.         Assessment & Plan:  1.Infarction of left basal ganglia:She has a scheduled appointment with [facility] and Continue Outpatient Therapy at [facility]. Continue to Monitor.  2.  Essential Hypertension: Continue current medication regimen. Continue to Monitor. PCP following.  3. Dyslipidemia. Continue current medication regimen. PCP Following. Continue to Monitor.   F/U in 4-6 weeks with Dr Letta Pate

## 2021-07-19 ENCOUNTER — Encounter: Payer: Self-pay | Admitting: Occupational Therapy

## 2021-07-19 ENCOUNTER — Encounter: Payer: Self-pay | Admitting: Physical Therapy

## 2021-07-19 ENCOUNTER — Ambulatory Visit: Payer: Medicare Other | Admitting: Physical Therapy

## 2021-07-19 ENCOUNTER — Ambulatory Visit: Payer: Medicare Other | Attending: Internal Medicine

## 2021-07-19 ENCOUNTER — Ambulatory Visit: Payer: Medicare Other | Admitting: Occupational Therapy

## 2021-07-19 DIAGNOSIS — M6281 Muscle weakness (generalized): Secondary | ICD-10-CM

## 2021-07-19 DIAGNOSIS — R2689 Other abnormalities of gait and mobility: Secondary | ICD-10-CM | POA: Diagnosis not present

## 2021-07-19 DIAGNOSIS — I69351 Hemiplegia and hemiparesis following cerebral infarction affecting right dominant side: Secondary | ICD-10-CM | POA: Diagnosis not present

## 2021-07-19 DIAGNOSIS — R2681 Unsteadiness on feet: Secondary | ICD-10-CM | POA: Diagnosis not present

## 2021-07-19 DIAGNOSIS — M25552 Pain in left hip: Secondary | ICD-10-CM

## 2021-07-19 DIAGNOSIS — R41841 Cognitive communication deficit: Secondary | ICD-10-CM | POA: Diagnosis not present

## 2021-07-19 DIAGNOSIS — R4701 Aphasia: Secondary | ICD-10-CM | POA: Insufficient documentation

## 2021-07-19 DIAGNOSIS — I69318 Other symptoms and signs involving cognitive functions following cerebral infarction: Secondary | ICD-10-CM

## 2021-07-19 DIAGNOSIS — M62838 Other muscle spasm: Secondary | ICD-10-CM | POA: Diagnosis not present

## 2021-07-19 NOTE — Therapy (Signed)
[facility] [address] [place], [place], [address] Phone: [phone]   Fax:  [phone]  Occupational Therapy Evaluation  Patient Details  Name: RHONDALYN CLINGAN MRN: [id number] Date of Birth: November 06, 1940 Referring Provider (OT): Bonnielee Haff, MD   Encounter Date: 07/19/2021   OT End of Session - 07/19/21 1641     Visit Number 1    Number of Visits 17    Date for OT Re-Evaluation 09/13/21    Authorization Type Medicare A and B    Authorization - Visit Number 1    Authorization - Number of Visits 10    Progress Note Due on Visit 10    OT Start Time 1020    OT Stop Time 1102    OT Time Calculation (min) 42 min    Activity Tolerance Patient tolerated treatment well    Behavior During Therapy Flat affect             Past Medical History:  Diagnosis Date   Breast cancer ([place]) 1993   bilateral mastectomy; no chemo or radiation   Glaucoma    Hyperlipidemia    Hypertension     Past Surgical History:  Procedure Laterality Date   MASTECTOMY Bilateral [place]    There were no vitals filed for this visit.   Subjective Assessment - 07/19/21 1631     Subjective  Pt and husband report pt with dominant RUE weakness, decreased coordination, and endurance impacting her ability to complete ADLs and IADLs at PLOF.    Patient is accompanied by: Family member   husband and son   Pertinent History breast cancer s/p bilateral mastectomy, glaucoma, HTN, and TIA    Patient Stated Goals "to improve in all areas"    Currently in Pain? No/denies               [facility] OT Assessment - 07/19/21 1035       Assessment   Medical Diagnosis Cerebrovascular accident (CVA), unspecified mechanism    Referring Provider (OT) Bonnielee Haff, MD    Onset Date/Surgical Date 06/24/21    Hand Dominance Right    Next MD Visit 08/30/21    Prior Therapy yes - CIR       Precautions   Precautions Fall      Balance Screen   Has the patient fallen in the past 6 months Yes    How many times? 1    Has the patient had a decrease in activity level because of a fear of falling?  No    Is the patient reluctant to leave their home because of a fear of falling?  No      Home  Environment   Family/patient expects to be discharged to: Assisted living   [place]   Alternate Level Stairs - Number of Steps elevators    Bathroom Shower/Tub Walk-in Shower   grab bars, bench without back, hand held shower head   Lives With Spouse      Prior Function   Level of Independence Independent with basic ADLs   has housekeeper 1x/week   Vocation Retired    Visual merchandiser, speaking comittee, traveling      ADL   Eating/Feeding Needs assist with cutting food    Upper Body Bathing Supervision/safety    Lower Body Bathing Supervision/safety    Upper Body Dressing Minimal assistance  Lower Body Dressing Min guard    Toilet Transfer Minimal assistance   husband is assisting with lifting pt off toilet   Tub/Shower Transfer Equipment Transfer tub bench;Grab bars;Increased time   hand held shower head     IADL   Light Housekeeping Does not participate in any housekeeping tasks   has housekeeper 1x/week   Meal Prep Able to complete simple cold meal and snack prep    Medication Management --   husband administers   Financial Management Requires assistance   husband completes     Written Expression   Dominant Hand Right    Handwriting 90% legible      Vision - History   Baseline Vision Wears glasses all the time    Visual History Glaucoma    Additional Comments R inattention      Vision Assessment   Comment R inattention, to be further assessed during functional tasks      Cognition   Overall Cognitive Status Impaired/Different from baseline    Attention Sustained    Sustained Attention Impaired    Memory Impaired    Awareness  Impaired      Posture/Postural Control   Posture/Postural Control Postural limitations    Postural Limitations Rounded Shoulders;Posterior pelvic tilt      Sensation   Light Touch Appears Intact      Coordination   Gross Motor Movements are Fluid and Coordinated No    Fine Motor Movements are Fluid and Coordinated No    Finger Nose Finger Test decreased on R compared to L    9 Hole Peg Test Right;Left    Right 9 Hole Peg Test 58.79    Left 9 Hole Peg Test 34.81    Box and Blocks R: 20 and L: 30      ROM / Strength   AROM / PROM / Strength AROM;Strength      AROM   Overall AROM  Within functional limits for tasks performed      Strength   Strength Assessment Site Shoulder;Elbow    Right/Left Shoulder --   R: grossly 4/5 and L: grossly 4+/5     Hand Function   Right Hand Grip (lbs) 30    Right Hand Lateral Pinch 12 lbs    Right Hand 3 Point Pinch 10 lbs    Left Hand Grip (lbs) 30    Left Hand Lateral Pinch 12 lbs    Left 3 point pinch 14 lbs                              OT Education - 07/19/21 1645     Education Details functional activities to complete for R attention, ROM, and activity tolerance (ie wiping counters, folding towels)    Person(s) Educated Patient;Spouse    Methods Explanation    Comprehension Verbalized understanding              OT Short Term Goals - 07/20/21 0805       OT SHORT TERM GOAL #1   Title Pt will demonstrate increased fine motor coordination for utensil use with reports of decreased spillage    Time 4    Period Weeks    Status New    Target Date 08/16/21      OT SHORT TERM GOAL #2   Title Pt will complete sit > stand over 3 different sessions with supervision to increase independence with toilet transfers.  Time 4    Period Weeks    Status New      OT SHORT TERM GOAL #3   Title Pt will be indepedent in use of HEP for [facility]    Time 4    Period Weeks    Status New      OT SHORT TERM GOAL #4   Title  Pt will demonstrate improved RUE functional use for ADLs as evidenced by increasing box/ blocks score by 5 blocks    Baseline R: 20 and L: 30    Time 4    Period Weeks    Status New               OT Long Term Goals - 07/20/21 9485       OT LONG TERM GOAL #1   Title Pt will demonstrate improved fine motor coordination for ADLs as evidenced by decreasing 9 hole peg test score for RUE by 15 secs    Baseline R: 58.79 and L: 34.81    Time 8    Period Weeks    Status New    Target Date 09/13/21      OT LONG TERM GOAL #2   Title Pt will demonstrate improved [place] as needed for handwriting by writing grocery list with 100% legibility utilizing adaptive techniques as needed.    Time 8    Period Weeks    Status New      OT LONG TERM GOAL #3   Title Pt will demonstate understanding of energy conservation strategies to increase safe participation in ADLs and IADLs.    Time 8    Period Weeks    Status New      OT LONG TERM GOAL #4   Title Pt will report increased ability to complete toilet transfers without physical assistance from caregiver.    Time 8    Period Weeks    Status New                   Plan - 07/19/21 1642     Clinical Impression Statement Pt is a 80 y/o female who presents to OP OT due to R hemiparesis s/p CVA with R weakness, R inattention, decreaesd [place], and impaired motor planning, cognitive impairments, and decreased safety awareness. 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 PMHx includes breast cancer s/p bilateral mastectomy, glaucoma, HTN, and TIA. Pt will benefit from skilled occupational therapy services to address strength and coordination, ROM, pain management, altered sensation, balance, GM/FM control, cognition, safety awareness, introduction of compensatory strategies/AE prn, visual-perception, and implementation of an HEP to improve participation and safety during ADLs and leisure pursuits.    OT  Occupational Profile and History Detailed Assessment- Review of Records and additional review of physical, cognitive, psychosocial history related to current functional performance    Occupational performance deficits (Please refer to evaluation for details): ADL's;IADL's;Leisure    Body Structure / Function / Physical Skills ADL;Balance;Coordination;Decreased knowledge of precautions;Continence;Decreased knowledge of use of DME;Dexterity;Endurance;Flexibility;FMC;GMC;IADL;Mobility;Pain;ROM;Strength;UE functional use;Vision    Cognitive Skills Attention;Memory;Problem Solve;Safety Awareness;Sequencing    Rehab Potential Good    Clinical Decision Making Limited treatment options, no task modification necessary    Comorbidities Affecting Occupational Performance: May have comorbidities impacting occupational performance    Modification or Assistance to Complete Evaluation  No modification of tasks or assist necessary to complete eval    OT Frequency 2x / week    OT Duration 8 weeks  OT Treatment/Interventions Self-care/ADL training;Cryotherapy;Electrical Stimulation;Moist Heat;Ultrasound;Iontophoresis;Therapeutic exercise;Neuromuscular education;Energy conservation;DME and/or AE instruction;Therapist, nutritional;Therapeutic activities;Cognitive remediation/compensation;Visual/perceptual remediation/compensation;Patient/family education;Balance training;Psychosocial skills training;Coping strategies training    Plan R attention, RUE [place], standing balance    Consulted and Agree with Plan of Care Patient;Family member/caregiver    Family Member Consulted husband             Patient will benefit from skilled therapeutic intervention in order to improve the following deficits and impairments:   Body Structure / Function / Physical Skills: ADL, Balance, Coordination, Decreased knowledge of precautions, Continence, Decreased knowledge of use of DME, Dexterity, Endurance, Flexibility, FMC, GMC,  IADL, Mobility, Pain, ROM, Strength, UE functional use, Vision Cognitive Skills: Attention, Memory, Problem Solve, Safety Awareness, Sequencing     Visit Diagnosis: Unsteadiness on feet  Other abnormalities of gait and mobility  Muscle weakness (generalized)  Other symptoms and signs involving cognitive functions following cerebral infarction  Hemiplegia and hemiparesis following cerebral infarction affecting right dominant side [facility])    Problem List Patient Active Problem List   Diagnosis Date Noted   CVA (cerebral vascular accident) ([place]) 07/09/2021   Stroke-like symptoms 07/08/2021   Benign essential HTN    Dyslipidemia    Prediabetes 06/27/2021   Hyperlipidemia, unspecified 06/27/2021   Stroke ([place]) 06/26/2021   Infarction of left basal ganglia ([place]) 06/25/2021   Essential hypertension 06/24/2021   TIA (transient ischemic attack) 05/03/2021   Hyponatremia     Lesa Vandall, [place], OT 07/20/2021, 8:20 AM  [facility] [address], [place], [place], [address] Phone: [phone]   Fax:  [phone]  Name: CAYLEI SPERRY MRN: [id number] Date of Birth: 1940/08/27

## 2021-07-19 NOTE — Therapy (Signed)
[facility] [address] [place], [place], [address] Phone: [phone]   Fax:  [phone]  Physical Therapy Evaluation  Patient Details  Name: Sandra Becker MRN: [id number] Date of Birth: 06/25/1941 Referring Provider (PT): Bonnielee Haff, MD   Encounter Date: 07/19/2021   PT End of Session - 07/19/21 1253     Visit Number 1    Number of Visits 17    Date for PT Re-Evaluation 09/13/21    Authorization Type Medicare/AARP    PT Start Time 1105    PT Stop Time 1148    PT Time Calculation (min) 43 min    Equipment Utilized During Treatment Gait belt    Activity Tolerance Patient limited by fatigue    Behavior During Therapy Flat affect             Past Medical History:  Diagnosis Date   Breast cancer ([place]) 1993   bilateral mastectomy; no chemo or radiation   Glaucoma    Hyperlipidemia    Hypertension     Past Surgical History:  Procedure Laterality Date   MASTECTOMY Bilateral Micro    There were no vitals filed for this visit.    Subjective Assessment - 07/19/21 1032     Subjective Patient reports experiencing a stroke on 06/24/21 and participated in [place] until 12/10. Went to the ED again d/t stroke like symptoms and having tipped over but was thought to have had BP issues. Worked on walking and strengthening in rehab. Still dealing with fatigue, R hemi weakness. Using RW for short distances, w/c for longer distances. Not using AD at [facility]. Reports no changes in sensation but notes spinning dizziness sometimes, unable to discern when. Has been having L posterior hip and groin pain recently, husband reports hx arthritis and thinking of taking her to see ortho.    Patient is accompained by: Family member   husband, son   Pertinent History breast CA with B mastectomy, HLD, HTN    Limitations Sitting;Lifting;Standing;Walking;House hold activities     How long can you walk comfortably? 15 min with RW    Diagnostic tests 06/25/21 brain MRI showed acute infarct left basal ganglia; 07/08/21 brain MRI showed interval expansions of L basal ganglia infarct    Patient Stated Goals "work on everything" including stability and walking tolerance    Currently in Pain? No/denies    Pain Score 0-No pain    Pain Location Hip    Pain Orientation Left    Pain Type Acute pain;Chronic pain                [facility] PT Assessment - 07/19/21 1033       Assessment   Medical Diagnosis Cerebrovascular accident (CVA), unspecified mechanism ([place])    Referring Provider (PT) Bonnielee Haff, MD    Onset Date/Surgical Date 06/24/21    Hand Dominance Right    Next MD Visit 08/30/21    Prior Therapy [facility] 12/01-12/10      Precautions   Precautions Fall   hx B mastectomy     Balance Screen   Has the patient fallen in the past 6 months Yes    How many times? 1   only one leading to hospitalization   Has the patient had a decrease in activity level because of a fear of falling?  Yes    Is the patient reluctant to leave their  home because of a fear of falling?  Yes      [place] living   [facility] independent living; no stairs   Home Equipment Walker - 2 wheels;Transport chair;Shower seat;Grab bars - toilet;Grab bars - tub/shower      Prior Function   Level of Independence Independent   currently husband assists with bed mobility and toilet transfers, STS transfers from chair, have a housekeeper 1x/week   Vocation Retired    Visual merchandiser, speaking comittee, traveling      Cognition   Overall Cognitive Status Difficult to assess   difficulty with word finding/expression     Sensation   Light Touch Appears Intact      Posture/Postural Control   Posture/Postural Control Postural limitations    Postural Limitations Rounded Shoulders;Posterior pelvic tilt      Tone   Assessment Location Other (comment)       Tone Assessment - Other   Other Tone Location Comments B LEs in knee/ankle WNL      ROM / Strength   AROM / PROM / Strength AROM;Strength      AROM   AROM Assessment Site Ankle    Right/Left Ankle Right;Left    Right Ankle Dorsiflexion 7    Left Ankle Plantar Flexion 10      Strength   Overall Strength Comments in sitting   pt unble to describe fatigue vs. discomfort but seems to look as if she is in pain   Strength Assessment Site Hip;Knee;Ankle    Right/Left Hip Right;Left    Right Hip Flexion 4-/5    Right Hip ABduction 4+/5    Right Hip ADduction 4/5    Left Hip Flexion 4/5    Left Hip ABduction 4+/5    Left Hip ADduction 4/5    Right/Left Knee Right;Left    Right Knee Flexion 4/5    Right Knee Extension 4-/5    Left Knee Flexion 4+/5    Left Knee Extension 4/5    Right/Left Ankle Right;Left    Right Ankle Dorsiflexion 4/5    Right Ankle Plantar Flexion 4/5    Left Ankle Dorsiflexion 4+/5    Left Ankle Plantar Flexion 4+/5      Palpation   Palpation comment very TTP in R lateral thigh and ITB, B adductors with painful taut trigger pts evident      Transfers   Comments stand pivot w/c<>toilet with 1 UE assist on grab bar and supervision      Ambulation/Gait   Assistive device Rolling walker    Gait Pattern Step-to pattern;Step-through pattern;Decreased step length - right;Decreased step length - left;Lateral trunk lean to right;Trunk flexed    Ambulation Surface Level;Indoor    Gait velocity decreased      Standardized Balance Assessment   Standardized Balance Assessment Timed Up and Go Test      Timed Up and Go Test   Normal TUG (seconds) 44.57   with RW; cues to push off arm rests with STS                       Objective measurements completed on examination: See above findings.                PT Education - 07/19/21 1252     Education Details prognosis, POC, current deficits and plans for Tx sessions    Person(s) Educated  Patient;Spouse;Child(ren)    Methods Explanation;Demonstration;Tactile cues;Verbal cues  Comprehension Verbalized understanding              PT Short Term Goals - 07/19/21 1301       PT SHORT TERM GOAL #1   Title Patient to perform HEP with family supervision for improved strength, balance, transfers, posture, and mobility.    Time 3    Period Weeks    Status New    Target Date 08/09/21               PT Long Term Goals - 07/19/21 1302       PT LONG TERM GOAL #1   Title Patient to perform advanced HEP with family supervision for improved strength, balance, transfers, posture, and mobility.    Time 8    Period Weeks    Status New    Target Date 09/13/21      PT LONG TERM GOAL #2   Title Patient to complete TUG in <30 sec with LRAD in order to decrease risk of falls.    Time 8    Period Weeks    Status New    Target Date 09/13/21      PT LONG TERM GOAL #3   Title Patient to demonstrate 5xSTS test in <20 sec in order to decrease risk of falls.    Time 8    Period Weeks    Status New    Target Date 09/13/21      PT LONG TERM GOAL #4   Title Patient to demonstrate B LE strength >/=4/5.    Time 8    Period Weeks    Status New    Target Date 08/30/21      PT LONG TERM GOAL #5   Title Patient/family to report tolerance for ambulating to dining fall with LRAD with good safety and no fatigue limiting.    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 Patient participated in [place] 12/01-12/10 and was D/Cd home. Returned to ED 07/08/21 with similar stroke-like symptoms; workup appeared negative with exception of expansion of L basal ganglia infarct on MRI. Patient today arrived in w/c, reports using RW in the home for short distances.  Not using A at PLOF. Family assisted with subjective report d/t trouble with word finding/expression. Patient notes intact R LE sensation but mentions episodes of spinning dizziness which she had trouble explaining. Patient today presenting with rounded shoulders and posterior pelvic tilt in sitting, normal tone, decreased R>L ankle DF AROM, R>L LE weakness, very TTP in R lateral thigh and ITB, B adductors with painful taut trigger pts evident, difficulty with transfers, and gait deviations. Patients TUG score indicates an increased risk of falls. Prior to current episode, patient was independent. Would benefit from skilled PT services 2x/week for 8 weeks to address aforementioned impairments in order to optimize level of function.    Personal Factors and Comorbidities Age;Comorbidity 3+;Transportation;Time since onset of injury/illness/exacerbation;Fitness;Past/Current Experience    Comorbidities breast CA with B mastectomy, HLD, HTN    Examination-Activity Limitations Bathing;Locomotion Level;Transfers;Bed Mobility;Reach Overhead;Bend;Carry;Squat;Sleep;Dressing;Stairs;Stand;Hygiene/Grooming;Lift;Toileting    Examination-Participation Restrictions Shop;Community Activity;Church    Stability/Clinical Decision Making Evolving/Moderate complexity  Clinical Decision Making Moderate    Rehab Potential Good    PT Frequency 2x / week    PT Duration 8 weeks    PT Treatment/Interventions ADLs/Self Care Home Management;Canalith Repostioning;Cryotherapy;Electrical Stimulation;DME Instruction;Ultrasound;Moist Heat;Iontophoresis 4mg /ml Dexamethasone;Gait training;Stair training;Functional mobility training;Therapeutic activities;Therapeutic exercise;Balance training;Neuromuscular re-education;Manual techniques;Patient/family education;Passive range of motion;Dry needling;Energy conservation;Vestibular;Taping    PT Next Visit Plan provide HEP, progress STS transfers, gait with RW, R hip stretching and STM,  balance    Consulted and Agree with Plan of Care Patient;Family member/caregiver    Family Member Consulted husband, son             Patient will benefit from skilled therapeutic intervention in order to improve the following deficits and impairments:  Abnormal gait, Decreased range of motion, Difficulty walking, Increased fascial restricitons, Decreased endurance, Decreased safety awareness, Dizziness, Increased muscle spasms, Pain, Decreased activity tolerance, Decreased balance, Impaired flexibility, Improper body mechanics, Postural dysfunction, Decreased strength  Visit Diagnosis: Hemiplegia and hemiparesis following cerebral infarction affecting right dominant side (HCC)  Pain in left hip  Other muscle spasm  Muscle weakness (generalized)  Other abnormalities of gait and mobility     Problem List Patient Active Problem List   Diagnosis Date Noted   CVA (cerebral vascular accident) ([place]) 07/09/2021   Stroke-like symptoms 07/08/2021   Benign essential HTN    Dyslipidemia    Prediabetes 06/27/2021   Hyperlipidemia, unspecified 06/27/2021   Stroke ([place]) 06/26/2021   Infarction of left basal ganglia ([place]) 06/25/2021   Essential hypertension 06/24/2021   TIA (transient ischemic attack) 05/03/2021   Hyponatremia     Janene Harvey, PT, DPT 07/19/21 1:08 PM   [facility] [address], [place], [place], [address] Phone: [phone]   Fax:  [phone]  Name: Sandra Becker MRN: [id number] Date of Birth: 11-21-1940

## 2021-07-19 NOTE — Therapy (Signed)
[facility] [address] [place], [place], [address] Phone: [phone]   Fax:  [phone]  Speech Language Pathology Evaluation  Patient Details  Name: Sandra Becker MRN: [id number] Date of Birth: [birth year] Referring Provider (SLP): Sandra Becker (ref), Sandra Pillar, MD (doc)   Encounter Date: 07/19/2021   End of Session - 07/19/21 1734     Visit Number 1    Number of Visits 25    Date for SLP Re-Evaluation 10/17/21    SLP Start Time 1150    SLP Stop Time  1443    SLP Time Calculation (min) 45 min    Activity Tolerance Patient tolerated treatment well             Past Medical History:  Diagnosis Date   Breast cancer ([place]) 1993   bilateral mastectomy; no chemo or radiation   Glaucoma    Hyperlipidemia    Hypertension     Past Surgical History:  Procedure Laterality Date   MASTECTOMY Bilateral [place]    There were no vitals filed for this visit.   Subjective Assessment - 07/19/21 1717     Subjective "Ah - I can I can I can say th - I can (sighs) - Oh, I don't know - (sighs). I don't know."    Currently in Pain? No/denies                SLP Evaluation [facility] - 07/19/21 1717       SLP Visit Information   SLP Received On 07/19/21    Referring Provider (SLP) Sandra Becker (ref), Sandra Pillar, MD (doc)    Onset Date November 2022    Medical Diagnosis lt CVA      Subjective   Patient/Family Stated Goal Improve expressive communication      General Information   HPI 80 y.o. female with medical history significant for HTN, glaucoma, TIA, HLD, breast cancer s/p bilateral mastectomy presents for dilation of sudden onset of slurred speech with right-sided weakness.  06/24/21 developed slurred speech that was difficult for her to get the words out and she had some right-sided weakness.  Husband called 25.  Symptoms resolved while in  the ambulance about 30 minutes after they started. On 07/08/21 pt had worsening speech sx and found expansion of previous CVA via MRI.      Balance Screen   Has the patient fallen in the past 6 months Yes    How many times? 1    Has the patient had a decrease in activity level because of a fear of falling?  No    Is the patient reluctant to leave their home because of a fear of falling?  No      Cognition   Overall Cognitive Status Difficult to assess   d/t aphasia     Auditory Comprehension   Overall Auditory Comprehension Impaired    Conversation Simple    Other Conversation Comments Pt repeated some questions and comments to herself prior to answering, suggesting decr'd auditory comprehension or decr'd processing skills.    EffectiveTechniques Extra processing time;Repetition;Slowed speech;Visual/Gestural cues   SLP used all of these during portions of evaluation and encouraged husband and son to use slowed speech, simplified language, and extra processing time with pt     Reading Comprehension   Reading Status Within funtional limits   suspect this is mildly negatively affected by  decr'd visual attention; pt read grandfather passage with 4 minor errors (within the last 5 lines);  more testing may be needed     Expression   Primary Mode of Expression Other (comment)   yes/no and pointing to select     Verbal Expression   Overall Verbal Expression Impaired    Level of Generative/Spontaneous Verbalization Word    Naming Impairment    Confrontation --   19/30 on boston naming test   Effective Techniques Semantic cues;Phonemic cues      Motor Speech   Overall Motor Speech Appears within functional limits for tasks assessed      Standardized Assessments   Standardized Assessments  Boston Naming Test-2nd edition    Boston Naming Test-2nd edition  19/30 ([place]) - WNL is >26.2/30 [employer], 2007 14(3):215-223))                             SLP Education -  07/19/21 1733     Education Details how to assist pt verbal expression at home, how to assist pt with homework (cues), slow rate and make speech more simplistic to foster comprehension from [employer]) Educated Patient;Spouse;Child(ren)    Methods Explanation;Demonstration    Comprehension Verbalized understanding;Returned demonstration;Need further instruction                  Plan - 07/19/21 1735     Clinical Impression Statement Pt presents today with at least mod expressive aphasia and mild receptive aphasia vs. auditory processing delay/deficit. Boston naming test ([place]) was outside/below WNL. Pt was unsuccessful with spontaneous speech in picture description without semantic cues from SLP. Pt husband Sandra Becker states primary communication of wants and needs is done by yes/no and giving Sandra Becker choices and her selecting one. SLP believes pt will benefit from skilled ST targeting receptive and expressive language to be able to communicate effectively at home and in the community.    Speech Therapy Frequency 2x / week    Duration 12 weeks    Treatment/Interventions Environmental controls;Functional tasks;Compensatory techniques;Multimodal communcation approach;SLP instruction and feedback;Cueing hierarchy;Language facilitation;Cognitive reorganization;Patient/family education;Internal/external aids    Potential to Achieve Goals Good    Consulted and Agree with Plan of Care Patient;Family member/caregiver    Family Member Consulted husband             Patient will benefit from skilled therapeutic intervention in order to improve the following deficits and impairments:   Aphasia  Cognitive communication deficit    Problem List Patient Active Problem List   Diagnosis Date Noted   CVA (cerebral vascular accident) ([place]) 07/09/2021   Stroke-like symptoms 07/08/2021   Benign essential HTN    Dyslipidemia    Prediabetes 06/27/2021   Hyperlipidemia, unspecified  06/27/2021   Stroke ([place]) 06/26/2021   Infarction of left basal ganglia ([place]) 06/25/2021   Essential hypertension 06/24/2021   TIA (transient ischemic attack) 05/03/2021   Hyponatremia     Koby Hartfield, CCC-SLP 07/19/2021, 5:44 PM  [facility] [address] [place], [place], [address] Phone: [phone]   Fax:  [phone]  Name: Sandra Becker MRN: [id number] Date of Birth: 1941-02-18

## 2021-07-20 DIAGNOSIS — I10 Essential (primary) hypertension: Secondary | ICD-10-CM | POA: Diagnosis not present

## 2021-07-20 DIAGNOSIS — E871 Hypo-osmolality and hyponatremia: Secondary | ICD-10-CM | POA: Diagnosis not present

## 2021-07-20 DIAGNOSIS — I6930 Unspecified sequelae of cerebral infarction: Secondary | ICD-10-CM | POA: Diagnosis not present

## 2021-07-25 ENCOUNTER — Encounter: Payer: Self-pay | Admitting: Physical Therapy

## 2021-07-25 ENCOUNTER — Other Ambulatory Visit: Payer: Self-pay

## 2021-07-25 ENCOUNTER — Ambulatory Visit: Payer: Medicare Other | Admitting: Physical Therapy

## 2021-07-25 DIAGNOSIS — M62838 Other muscle spasm: Secondary | ICD-10-CM

## 2021-07-25 DIAGNOSIS — R4701 Aphasia: Secondary | ICD-10-CM | POA: Diagnosis not present

## 2021-07-25 DIAGNOSIS — M25552 Pain in left hip: Secondary | ICD-10-CM

## 2021-07-25 DIAGNOSIS — R41841 Cognitive communication deficit: Secondary | ICD-10-CM | POA: Diagnosis not present

## 2021-07-25 DIAGNOSIS — M6281 Muscle weakness (generalized): Secondary | ICD-10-CM

## 2021-07-25 DIAGNOSIS — R2689 Other abnormalities of gait and mobility: Secondary | ICD-10-CM

## 2021-07-25 DIAGNOSIS — R2681 Unsteadiness on feet: Secondary | ICD-10-CM | POA: Diagnosis not present

## 2021-07-25 DIAGNOSIS — I69351 Hemiplegia and hemiparesis following cerebral infarction affecting right dominant side: Secondary | ICD-10-CM

## 2021-07-25 NOTE — Therapy (Signed)
[facility] [address], [place], [place], [address] Phone: [phone]   Fax:  [phone]  Physical Therapy Treatment  Patient Details  Name: Sandra Becker MRN: [id number] Date of Birth: 1941/04/09 Referring Provider (PT): Bonnielee Haff, MD   Encounter Date: 07/25/2021   PT End of Session - 07/25/21 1011     Visit Number 2    Number of Visits 17    Date for PT Re-Evaluation 09/13/21    Authorization Type Medicare/AARP    PT Start Time 0921    PT Stop Time 1007    PT Time Calculation (min) 46 min    Equipment Utilized During Treatment Gait belt    Activity Tolerance Patient limited by fatigue    Behavior During Therapy Flat affect             Past Medical History:  Diagnosis Date   Breast cancer ([place]) 1993   bilateral mastectomy; no chemo or radiation   Glaucoma    Hyperlipidemia    Hypertension     Past Surgical History:  Procedure Laterality Date   MASTECTOMY Bilateral [place]    There were no vitals filed for this visit.   Subjective Assessment - 07/25/21 0920     Subjective Still a bit fatigued. L hip has been bothering her, not sure if it is a problem right now.    Patient is accompained by: Family member    Pertinent History breast CA with B mastectomy, HLD, HTN    Diagnostic tests 06/25/21 brain MRI showed acute infarct left basal ganglia; 07/08/21 brain MRI showed interval expansions of L basal ganglia infarct    Patient Stated Goals "work on everything" including stability and walking tolerance    Currently in Pain? No/denies                [facility] PT Assessment - 07/25/21 0001       Standardized Balance Assessment   Standardized Balance Assessment Five Times Sit to Stand    Five times sit to stand comments  22.5 sec with heavy UE support                           [facility] Adult PT Treatment/Exercise -  07/25/21 0001       Neuro Re-ed    Neuro Re-ed Details  romberg 30" in RW      Exercises   Exercises Knee/Hip      Knee/Hip Exercises: Stretches   Hip Flexor Stretch Left;2 reps;30 seconds    Hip Flexor Stretch Limitations mod thomas with foot resting on 4" step    Other Knee/Hip Stretches butterfly stretch supine 2x30"   to tolerance     Knee/Hip Exercises: Standing   Heel Raises Both;1 set;10 reps    Heel Raises Limitations B heel/toe raise in RW      Knee/Hip Exercises: Seated   Sit to Sand 1 set;10 reps;without UE support   sitting on airex; cues to scoot to edge, slide feet back, anterior trunk lean     Knee/Hip Exercises: Supine   Bridges Strengthening;1 set;10 reps    Bridges Limitations good ROM      Manual Therapy   Manual Therapy Soft tissue mobilization;Myofascial release    Manual therapy comments supine    Soft tissue mobilization STM to L adductors, quads, hip flexors  Myofascial Release manual TPR to L adductors, quads, hip flexors   large taut bands of muscle and trigger pts in adductors and quads                    PT Education - 07/25/21 1008     Education Details HEP reviewed in detail with patient and family- Access Code: [email or web address]; to be performed with family supervision    Person(s) Educated Patient;Spouse;Child(ren)    Methods Explanation;Demonstration;Tactile cues;Verbal cues;Handout    Comprehension Verbalized understanding;Returned demonstration              PT Short Term Goals - 07/25/21 1012       PT SHORT TERM GOAL #1   Title Patient to perform HEP with family supervision for improved strength, balance, transfers, posture, and mobility.    Time 3    Period Weeks    Status On-going    Target Date 08/09/21               PT Long Term Goals - 07/25/21 1013       PT LONG TERM GOAL #1   Title Patient to perform advanced HEP with family supervision for improved strength, balance, transfers, posture, and mobility.     Time 8    Period Weeks    Status On-going    Target Date 09/13/21      PT LONG TERM GOAL #2   Title Patient to complete TUG in <30 sec with LRAD in order to decrease risk of falls.    Time 8    Period Weeks    Status On-going    Target Date 09/13/21      PT LONG TERM GOAL #3   Title Patient to demonstrate 5xSTS test in <20 sec in order to decrease risk of falls.    Time 8    Period Weeks    Status On-going    Target Date 09/13/21      PT LONG TERM GOAL #4   Title Patient to demonstrate B LE strength >/=4/5.    Time 8    Period Weeks    Status On-going    Target Date 08/30/21      PT LONG TERM GOAL #5   Title Patient/family to report tolerance for ambulating to dining fall with LRAD with good safety and no fatigue limiting.    Time 8    Period Weeks    Status On-going    Target Date 09/13/21                   Plan - 07/25/21 1012     Clinical Impression Statement Patient arrived to session with family without new complaints. Patients score on 5xSTS indicates an increased risk of falls. Provided cueing for set up of transfers for improved transfer ability. Patient will require increased practice and family supervision with this exercise. Patient with large taut bands of muscle and trigger pts in L adductors and quads evident with MT. Proceeded with gentle hip stretching to patients tolerance. Standing exercises were performed in RW for safety however patient demonstrated good stability. Intermittent sitting water breaks taken throughout session d/t fatigue. Reviewed HEP with patient and family who reported understanding and without complaints at end of session.    Comorbidities breast CA with B mastectomy, HLD, HTN    PT Treatment/Interventions ADLs/Self Care Home Management;Canalith Repostioning;Cryotherapy;Electrical Stimulation;DME Instruction;Ultrasound;Moist Heat;Iontophoresis 4mg /ml Dexamethasone;Gait training;Stair training;Functional mobility  training;Therapeutic activities;Therapeutic exercise;Balance training;Neuromuscular re-education;Manual techniques;Patient/family education;Passive range of motion;Dry  needling;Energy conservation;Vestibular;Taping    PT Next Visit Plan progress STS transfers, gait with RW, R hip stretching and STM, balance    Consulted and Agree with Plan of Care Patient;Family member/caregiver    Family Member Consulted husband, son             Patient will benefit from skilled therapeutic intervention in order to improve the following deficits and impairments:  Abnormal gait, Decreased range of motion, Difficulty walking, Increased fascial restricitons, Decreased endurance, Decreased safety awareness, Dizziness, Increased muscle spasms, Pain, Decreased activity tolerance, Decreased balance, Impaired flexibility, Improper body mechanics, Postural dysfunction, Decreased strength  Visit Diagnosis: Hemiplegia and hemiparesis following cerebral infarction affecting right dominant side (HCC)  Pain in left hip  Other muscle spasm  Muscle weakness (generalized)  Other abnormalities of gait and mobility     Problem List Patient Active Problem List   Diagnosis Date Noted   CVA (cerebral vascular accident) ([place]) 07/09/2021   Stroke-like symptoms 07/08/2021   Benign essential HTN    Dyslipidemia    Prediabetes 06/27/2021   Hyperlipidemia, unspecified 06/27/2021   Stroke ([place]) 06/26/2021   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, [place], [place], [address] Phone: [phone]   Fax:  [phone]  Name: KELCI PETRELLA MRN: [id number] Date of Birth: 04-Mar-1941

## 2021-07-27 ENCOUNTER — Ambulatory Visit: Payer: Medicare Other | Admitting: Occupational Therapy

## 2021-07-27 ENCOUNTER — Ambulatory Visit: Payer: Medicare Other | Admitting: Physical Therapy

## 2021-07-27 ENCOUNTER — Other Ambulatory Visit: Payer: Self-pay

## 2021-07-27 DIAGNOSIS — R2681 Unsteadiness on feet: Secondary | ICD-10-CM | POA: Diagnosis not present

## 2021-07-27 DIAGNOSIS — I69351 Hemiplegia and hemiparesis following cerebral infarction affecting right dominant side: Secondary | ICD-10-CM

## 2021-07-27 DIAGNOSIS — M6281 Muscle weakness (generalized): Secondary | ICD-10-CM

## 2021-07-27 DIAGNOSIS — R2689 Other abnormalities of gait and mobility: Secondary | ICD-10-CM | POA: Diagnosis not present

## 2021-07-27 DIAGNOSIS — I69318 Other symptoms and signs involving cognitive functions following cerebral infarction: Secondary | ICD-10-CM

## 2021-07-27 DIAGNOSIS — R41841 Cognitive communication deficit: Secondary | ICD-10-CM | POA: Diagnosis not present

## 2021-07-27 DIAGNOSIS — R4701 Aphasia: Secondary | ICD-10-CM | POA: Diagnosis not present

## 2021-07-27 NOTE — Patient Instructions (Signed)
°  Coordination Activities  Perform the following activities for 10-15 minutes 1 times per day with right hand(s).  Rotate ball in fingertips (clockwise and counter-clockwise). Toss ball between hands. Toss ball in air and catch with the same hand. Flip cards 1 at a time as fast as you can. Deal cards with your thumb (Hold deck in hand and push card off top with thumb). Pick up coins, buttons, marbles, dried beans/pasta of different sizes and place in container. Pick up coins and place in container or coin bank. Pick up coins and stack. Pick up coins one at a time until you get 5-10 in your hand, then move coins from palm to fingertips to stack one at a time. Twirl pen between fingers.

## 2021-07-27 NOTE — Therapy (Signed)
[facility] [address] [place], [place], [address] Phone: [phone]   Fax:  [phone]  Occupational Therapy Treatment  Patient Details  Name: Sandra Becker MRN: [id number] Date of Birth: 1941/07/22 Referring Provider (OT): Bonnielee Haff, MD   Encounter Date: 07/27/2021   OT End of Session - 07/27/21 0845     Visit Number 2    Number of Visits 17    Date for OT Re-Evaluation 09/13/21    Authorization Type Medicare A and B    Authorization - Visit Number 2    Authorization - Number of Visits 10    Progress Note Due on Visit 10    OT Start Time [phone]    OT Stop Time 0930    OT Time Calculation (min) 44 min    Activity Tolerance Patient tolerated treatment well    Behavior During Therapy Flat affect             Past Medical History:  Diagnosis Date   Breast cancer ([place]) 1993   bilateral mastectomy; no chemo or radiation   Glaucoma    Hyperlipidemia    Hypertension     Past Surgical History:  Procedure Laterality Date   MASTECTOMY Bilateral [place]    There were no vitals filed for this visit.   Subjective Assessment - 07/27/21 0844     Subjective  Pt and husband report pt not sleeping well since the stroke.    Patient is accompanied by: Family member   husband and son   Pertinent History breast cancer s/p bilateral mastectomy, glaucoma, HTN, and TIA    Patient Stated Goals "to improve in all areas"    Currently in Pain? No/denies               Dynamic standing balance with [place] with reaching for resistive clothespins (1,2,4#) and placing on horizontal and vertical dowels.  Therapist increased challenge to incorporate cognitive challenge by incorporating pattern and then removing clothespins in alphabetical order.  Pt with increased difficulty replicating pattern with 3 colors requiring mod cues to identify errors, then able to  correct without additional cues.  Pt stood for 6 mins with close supervision while reaching across midline and vertically with RUE.  Sit > stand prior to standing task with CGA to close supervision.  Pt able to complete sit > stand x3 with use of hands, as unable to complete without hands, CGA provided due to decreased anterior weight shift.    [facility] tasks with flipping cards; dealing cards with thumb; picking up coins to stack, place in container, and complete in-hand manipulation and translation; and ball toss between hands and in the same hand to address coordination and ataxia in dominant RUE.  Pt sliding coins towards edge of table ~25% of time but able to pick up remainder of time.  Pt with increased difficulty with tossing ball in same hand with decreased clearance from hand. Cues provided to increase distance and arc when tossing ball from R <> L hand.  Provided with printed HEP of above activities.  Educated on functional items to complete [facility] tasks with as well, such as peeling an orange, peeling a banana, opening jars.                   OT Short Term Goals - 07/27/21 0856       OT SHORT TERM GOAL #  1   Title Pt will demonstrate increased fine motor coordination for utensil use with reports of decreased spillage    Time 4    Period Weeks    Status On-going    Target Date 08/16/21      OT SHORT TERM GOAL #2   Title Pt will complete sit > stand over 3 different sessions with supervision to increase independence with toilet transfers.    Time 4    Period Weeks    Status On-going      OT SHORT TERM GOAL #3   Title Pt will be indepedent in use of HEP for [facility]    Time 4    Period Weeks    Status On-going      OT SHORT TERM GOAL #4   Title Pt will demonstrate improved RUE functional use for ADLs as evidenced by increasing box/ blocks score by 5 blocks    Baseline R: 20 and L: 30    Time 4    Period Weeks    Status On-going               OT Long Term Goals -  07/27/21 0857       OT LONG TERM GOAL #1   Title Pt will demonstrate improved fine motor coordination for ADLs as evidenced by decreasing 9 hole peg test score for RUE by 15 secs    Baseline R: 58.79 and L: 34.81    Time 8    Period Weeks    Status On-going    Target Date 09/13/21      OT LONG TERM GOAL #2   Title Pt will demonstrate improved [place] as needed for handwriting by writing grocery list with 100% legibility utilizing adaptive techniques as needed.    Time 8    Period Weeks    Status On-going      OT LONG TERM GOAL #3   Title Pt will demonstate understanding of energy conservation strategies to increase safe participation in ADLs and IADLs.    Time 8    Period Weeks    Status On-going      OT LONG TERM GOAL #4   Title Pt will report increased ability to complete toilet transfers without physical assistance from caregiver.    Time 8    Period Weeks    Status On-going                   Plan - 07/27/21 0846     Clinical Impression Statement Pt responded well to intervention.  Pt requiring min-mod cues for cognitive challenge during dynamic standing balance activity.  Pt unable to complete sit> stand without UE support, but able to complete with use of BUE to push up from mat table and cues for anterior weight shift.  CGA for standing balance and stand pivot transfers.  Pt receptive to cues and instructions during [facility] HEP instruction.    OT Occupational Profile and History Detailed Assessment- Review of Records and additional review of physical, cognitive, psychosocial history related to current functional performance    Occupational performance deficits (Please refer to evaluation for details): ADL's;IADL's;Leisure    Body Structure / Function / Physical Skills ADL;Balance;Coordination;Decreased knowledge of precautions;Continence;Decreased knowledge of use of DME;Dexterity;Endurance;Flexibility;FMC;GMC;IADL;Mobility;Pain;ROM;Strength;UE functional use;Vision     Cognitive Skills Attention;Memory;Problem Solve;Safety Awareness;Sequencing    Rehab Potential Good    Clinical Decision Making Limited treatment options, no task modification necessary    Comorbidities Affecting Occupational Performance: May have comorbidities impacting  occupational performance    Modification or Assistance to Complete Evaluation  No modification of tasks or assist necessary to complete eval    OT Frequency 2x / week    OT Duration 8 weeks    OT Treatment/Interventions Self-care/ADL training;Cryotherapy;Electrical Stimulation;Moist Heat;Ultrasound;Iontophoresis;Therapeutic exercise;Neuromuscular education;Energy conservation;DME and/or AE instruction;Therapist, nutritional;Therapeutic activities;Cognitive remediation/compensation;Visual/perceptual remediation/compensation;Patient/family education;Balance training;Psychosocial skills training;Coping strategies training    Plan R attention, RUE FMC, standing balance    OT Home Exercise Plan [facility]    Consulted and Agree with Plan of Care Patient    Family Member Consulted husband             Patient will benefit from skilled therapeutic intervention in order to improve the following deficits and impairments:   Body Structure / Function / Physical Skills: ADL, Balance, Coordination, Decreased knowledge of precautions, Continence, Decreased knowledge of use of DME, Dexterity, Endurance, Flexibility, FMC, GMC, IADL, Mobility, Pain, ROM, Strength, UE functional use, Vision Cognitive Skills: Attention, Memory, Problem Solve, Safety Awareness, Sequencing     Visit Diagnosis: Hemiplegia and hemiparesis following cerebral infarction affecting right dominant side (HCC)  Muscle weakness (generalized)  Unsteadiness on feet  Other abnormalities of gait and mobility  Other symptoms and signs involving cognitive functions following cerebral infarction    Problem List Patient Active Problem List   Diagnosis Date Noted    CVA (cerebral vascular accident) ([place]) 07/09/2021   Stroke-like symptoms 07/08/2021   Benign essential HTN    Dyslipidemia    Prediabetes 06/27/2021   Hyperlipidemia, unspecified 06/27/2021   Stroke ([place]) 06/26/2021   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, [place], [place], [address] Phone: [phone]   Fax:  [phone]  Name: Sandra Becker MRN: [id number] Date of Birth: 1941-07-08

## 2021-07-31 ENCOUNTER — Encounter: Payer: Self-pay | Admitting: Physical Therapy

## 2021-07-31 ENCOUNTER — Other Ambulatory Visit: Payer: Self-pay

## 2021-07-31 ENCOUNTER — Encounter: Payer: Self-pay | Admitting: Occupational Therapy

## 2021-07-31 ENCOUNTER — Ambulatory Visit: Payer: Medicare Other | Attending: Internal Medicine | Admitting: Occupational Therapy

## 2021-07-31 ENCOUNTER — Ambulatory Visit: Payer: Medicare Other | Admitting: Physical Therapy

## 2021-07-31 DIAGNOSIS — R2689 Other abnormalities of gait and mobility: Secondary | ICD-10-CM | POA: Insufficient documentation

## 2021-07-31 DIAGNOSIS — R2681 Unsteadiness on feet: Secondary | ICD-10-CM | POA: Insufficient documentation

## 2021-07-31 DIAGNOSIS — R41841 Cognitive communication deficit: Secondary | ICD-10-CM | POA: Insufficient documentation

## 2021-07-31 DIAGNOSIS — M25552 Pain in left hip: Secondary | ICD-10-CM | POA: Diagnosis not present

## 2021-07-31 DIAGNOSIS — R4701 Aphasia: Secondary | ICD-10-CM | POA: Diagnosis not present

## 2021-07-31 DIAGNOSIS — I69318 Other symptoms and signs involving cognitive functions following cerebral infarction: Secondary | ICD-10-CM | POA: Insufficient documentation

## 2021-07-31 DIAGNOSIS — I69351 Hemiplegia and hemiparesis following cerebral infarction affecting right dominant side: Secondary | ICD-10-CM | POA: Insufficient documentation

## 2021-07-31 DIAGNOSIS — M6281 Muscle weakness (generalized): Secondary | ICD-10-CM | POA: Diagnosis not present

## 2021-07-31 NOTE — Therapy (Signed)
[facility] [address] [place], [place], [address] Phone: [phone]   Fax:  [phone]  Physical Therapy Treatment  Patient Details  Name: Sandra Becker MRN: [id number] Date of Birth: [birth year] Referring Provider (PT): Bonnielee Haff, MD   Encounter Date: 07/31/2021   PT End of Session - 07/31/21 1151     Visit Number 3    Number of Visits 17    Date for PT Re-Evaluation 09/13/21    Authorization Type Medicare/AARP    PT Start Time 1103    PT Stop Time 1142    PT Time Calculation (min) 39 min    Equipment Utilized During Treatment Gait belt    Activity Tolerance Patient limited by fatigue    Behavior During Therapy Flat affect             Past Medical History:  Diagnosis Date   Breast cancer ([place]) 1993   bilateral mastectomy; no chemo or radiation   Glaucoma    Hyperlipidemia    Hypertension     Past Surgical History:  Procedure Laterality Date   MASTECTOMY Bilateral [place]    There were no vitals filed for this visit.   Subjective Assessment - 07/31/21 1106     Subjective No changes, L hip bothering me "some"    Patient is accompained by: Family member    Pertinent History breast CA with B mastectomy, HLD, HTN    Diagnostic tests 06/25/21 brain MRI showed acute infarct left basal ganglia; 07/08/21 brain MRI showed interval expansions of L basal ganglia infarct    Patient Stated Goals "work on everything" including stability and walking tolerance    Currently in Pain? Yes    Pain Score 3     Pain Location Hip    Pain Orientation Left    Pain Descriptors / Indicators Aching    Pain Type Acute pain;Chronic pain    Aggravating Factors  unsure    Pain Relieving Factors unsure                               [facility] Adult PT Treatment/Exercise - 07/31/21 0001       Ambulation/Gait   Ambulation/Gait Yes     Ambulation/Gait Assistance 4: Min guard    Ambulation Distance (Feet) 80 Feet   x 2; 90 ft x 2; seated rest break   Assistive device Rolling walker    Gait Pattern Step-to pattern;Step-through pattern;Decreased step length - right;Decreased step length - left;Lateral trunk lean to right;Trunk flexed    Ambulation Surface Level;Indoor    Gait Comments Cues (verbal and tactile) for improved weightshift through hips for improved step length.  Cues to keep RW on floor during turns.      High Level Balance   High Level Balance Comments Standing at RW:  lateral weightshifting x 10 reps, then stagger stance anterior/posterior weigthshifting x 5 reps each position      Exercises   Exercises Knee/Hip      Knee/Hip Exercises: Stretches   Hip Flexor Stretch Left;2 reps;30 seconds    Hip Flexor Stretch Limitations mod thomas with foot resting on floor    Other Knee/Hip Stretches Reviewed stretches in HEP-pt able to return demo with cues.    Other Knee/Hip Stretches butterfly stretch supine 2x30"      Knee/Hip Exercises: Standing  Heel Raises Both;1 set;10 reps    Heel Raises Limitations B heel/toe raise in RW    Hip Flexion Right;Left;1 set;10 reps;Knee bent    Hip Flexion Limitations Marching in place at [place] 5 reps;2 sets   BUE support at walker, then no UE support     Knee/Hip Exercises: Seated   Other Seated Knee/Hip Exercises Seated ankle pumps x 10 reps    Sit to Sand 1 set;10 reps;without UE support   Cues for forward scoot, forward lean, anterior weightshift (several reps needing to repeat due to pushing on mat at posterior aspect of legs)     Knee/Hip Exercises: Supine   Bridges Strengthening;10 reps;2 sets                       PT Short Term Goals - 07/25/21 1012       PT SHORT TERM GOAL #1   Title Patient to perform HEP with family supervision for improved strength, balance, transfers, posture, and mobility.    Time 3    Period Weeks    Status  On-going    Target Date 08/09/21               PT Long Term Goals - 07/25/21 1013       PT LONG TERM GOAL #1   Title Patient to perform advanced HEP with family supervision for improved strength, balance, transfers, posture, and mobility.    Time 8    Period Weeks    Status On-going    Target Date 09/13/21      PT LONG TERM GOAL #2   Title Patient to complete TUG in <30 sec with LRAD in order to decrease risk of falls.    Time 8    Period Weeks    Status On-going    Target Date 09/13/21      PT LONG TERM GOAL #3   Title Patient to demonstrate 5xSTS test in <20 sec in order to decrease risk of falls.    Time 8    Period Weeks    Status On-going    Target Date 09/13/21      PT LONG TERM GOAL #4   Title Patient to demonstrate B LE strength >/=4/5.    Time 8    Period Weeks    Status On-going    Target Date 08/30/21      PT LONG TERM GOAL #5   Title Patient/family to report tolerance for ambulating to dining fall with LRAD with good safety and no fatigue limiting.    Time 8    Period Weeks    Status On-going    Target Date 09/13/21                   Plan - 07/31/21 1151     Clinical Impression Statement Skilled PT session today continued to focus on proper sit to stand technique for improved safety with transfers.  With practice and with cues, she is able to improve with repetition.  Also addressed lower extremity strengthening and flexibility as well as gait training with RW.  With RW, pt demonstrates improved step through pattern with gait today.  She needs assist and cues for safe RW management with turns and changes of direction.  She will continue to benefit from skilled PT towards goals for improved overall functional mobility.    Comorbidities breast CA with B mastectomy, HLD, HTN    PT  Treatment/Interventions ADLs/Self Care Home Management;Canalith Repostioning;Cryotherapy;Electrical Stimulation;DME Instruction;Ultrasound;Moist Heat;Iontophoresis  4mg /ml Dexamethasone;Gait training;Stair training;Functional mobility training;Therapeutic activities;Therapeutic exercise;Balance training;Neuromuscular re-education;Manual techniques;Patient/family education;Passive range of motion;Dry needling;Energy conservation;Vestibular;Taping    PT Next Visit Plan progress STS transfers, gait with RW, R hip stretching and STM, balance activities; add to HEP as appropriate    Consulted and Agree with Plan of Care Patient;Family member/caregiver    Family Member Consulted husband (in lobby)             Patient will benefit from skilled therapeutic intervention in order to improve the following deficits and impairments:  Abnormal gait, Decreased range of motion, Difficulty walking, Increased fascial restricitons, Decreased endurance, Decreased safety awareness, Dizziness, Increased muscle spasms, Pain, Decreased activity tolerance, Decreased balance, Impaired flexibility, Improper body mechanics, Postural dysfunction, Decreased strength  Visit Diagnosis: Muscle weakness (generalized)  Unsteadiness on feet  Other abnormalities of gait and mobility     Problem List Patient Active Problem List   Diagnosis Date Noted   CVA (cerebral vascular accident) ([place]) 07/09/2021   Stroke-like symptoms 07/08/2021   Benign essential HTN    Dyslipidemia    Prediabetes 06/27/2021   Hyperlipidemia, unspecified 06/27/2021   Stroke ([place]) 06/26/2021   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 [place], [place], [address] Phone: [phone]   Fax:  [phone]  Name: ZORIA RAWLINSON MRN: [id number] Date of Birth: 1941/05/05

## 2021-07-31 NOTE — Therapy (Signed)
[facility] [address], [place], [place], [address] Phone: [phone]   Fax:  [phone]  Occupational Therapy Treatment  Patient Details  Name: Sandra Becker MRN: [id number] Date of Birth: 12-06-1940 Referring Provider (OT): Bonnielee Haff, MD   Encounter Date: 07/31/2021   OT End of Session - 07/31/21 1025     Visit Number 3    Number of Visits 17    Date for OT Re-Evaluation 09/13/21    Authorization Type Medicare A and B    Authorization - Visit Number 2    Authorization - Number of Visits 10    Progress Note Due on Visit 10    OT Start Time 1018    OT Stop Time 1101    OT Time Calculation (min) 43 min    Activity Tolerance Patient tolerated treatment well    Behavior During Therapy Flat affect             Past Medical History:  Diagnosis Date   Breast cancer ([place]) 1993   bilateral mastectomy; no chemo or radiation   Glaucoma    Hyperlipidemia    Hypertension     Past Surgical History:  Procedure Laterality Date   MASTECTOMY Bilateral [place]    There were no vitals filed for this visit.   Subjective Assessment - 07/31/21 1024     Subjective  Pt and husband report pt still not sleeping well.  Reports person from "[employer]" providing them with some suggestions to aid in sleep, reports a slightly better sleep last night.    Patient is accompanied by: Family member   husband and son   Pertinent History breast cancer s/p bilateral mastectomy, glaucoma, HTN, and TIA    Patient Stated Goals "to improve in all areas"    Currently in Pain? No/denies              Dynamic standing balance activity incorporating trunk rotation to retreive puzzle pieces from R and L on therapy mat to challenge balance reactions and weight shifting.  Completed 24 piece jigsaw puzzle in standing with close supervision for balance.  Pt required  mod-max question cues and decreasing puzzle piece stimulus to increase success and completion of puzzle.  Utilized RUE 50% of time, requiring cues to utilize RUE and utilize picture on R side to facilitate R attention.    [place] and strengthening with theraputty.  Pt with difficulty opening container, requiring increased time.  Pt required mod multimodal cues for theraputty exercises for proper technique, directing pt back to picture and written instructions to facilitate carryover to home use.     Access Code: [email or web address] URL: [email or web address] Date: 07/31/2021 Prepared by: [facility]  Exercises Putty Squeezes - 1 x daily - 7 x weekly - 3 sets - 10 reps Rolling Putty on Table - 1 x daily - 7 x weekly - 3 sets - 10 reps Thumb Opposition with Putty - 2 x daily - 7 x weekly - 1 sets - 10 reps 3-Point Pinch with Putty - 1 x daily - 7 x weekly - 3 sets - 10 reps Key Pinch with Putty - 1 x daily - 7 x weekly - 3 sets - 10 reps Remove small items from putty                   OT Short  Term Goals - 07/27/21 0856       OT SHORT TERM GOAL #1   Title Pt will demonstrate increased fine motor coordination for utensil use with reports of decreased spillage    Time 4    Period Weeks    Status On-going    Target Date 08/16/21      OT SHORT TERM GOAL #2   Title Pt will complete sit > stand over 3 different sessions with supervision to increase independence with toilet transfers.    Time 4    Period Weeks    Status On-going      OT SHORT TERM GOAL #3   Title Pt will be indepedent in use of HEP for [facility]    Time 4    Period Weeks    Status On-going      OT SHORT TERM GOAL #4   Title Pt will demonstrate improved RUE functional use for ADLs as evidenced by increasing box/ blocks score by 5 blocks    Baseline R: 20 and L: 30    Time 4    Period Weeks    Status On-going               OT Long Term Goals - 07/27/21 0857        OT LONG TERM GOAL #1   Title Pt will demonstrate improved fine motor coordination for ADLs as evidenced by decreasing 9 hole peg test score for RUE by 15 secs    Baseline R: 58.79 and L: 34.81    Time 8    Period Weeks    Status On-going    Target Date 09/13/21      OT LONG TERM GOAL #2   Title Pt will demonstrate improved [place] as needed for handwriting by writing grocery list with 100% legibility utilizing adaptive techniques as needed.    Time 8    Period Weeks    Status On-going      OT LONG TERM GOAL #3   Title Pt will demonstate understanding of energy conservation strategies to increase safe participation in ADLs and IADLs.    Time 8    Period Weeks    Status On-going      OT LONG TERM GOAL #4   Title Pt will report increased ability to complete toilet transfers without physical assistance from caregiver.    Time 8    Period Weeks    Status On-going                   Plan - 07/31/21 1027     Clinical Impression Statement Pt continues to have flat affect, limiting interaction and ability for therapist to guage engagement in tasks. Pt requires mod cues for cognitive challenge during dynamic standing balance activity.  Pt continues to rely on BUE support with sit > stand and leg support against mat table during standing.  CGA for standing balance and stand pivot transfers.  Pt receptive to cues and instructions during all structured tasks.    OT Occupational Profile and History Detailed Assessment- Review of Records and additional review of physical, cognitive, psychosocial history related to current functional performance    Occupational performance deficits (Please refer to evaluation for details): ADL's;IADL's;Leisure    Body Structure / Function / Physical Skills ADL;Balance;Coordination;Decreased knowledge of precautions;Continence;Decreased knowledge of use of DME;Dexterity;Endurance;Flexibility;FMC;GMC;IADL;Mobility;Pain;ROM;Strength;UE functional use;Vision     Cognitive Skills Attention;Memory;Problem Solve;Safety Awareness;Sequencing    Rehab Potential Good    Clinical Decision Making Limited treatment  options, no task modification necessary    Comorbidities Affecting Occupational Performance: May have comorbidities impacting occupational performance    Modification or Assistance to Complete Evaluation  No modification of tasks or assist necessary to complete eval    OT Frequency 2x / week    OT Duration 8 weeks    OT Treatment/Interventions Self-care/ADL training;Cryotherapy;Electrical Stimulation;Moist Heat;Ultrasound;Iontophoresis;Therapeutic exercise;Neuromuscular education;Energy conservation;DME and/or AE instruction;Therapist, nutritional;Therapeutic activities;Cognitive remediation/compensation;Visual/perceptual remediation/compensation;Patient/family education;Balance training;Psychosocial skills training;Coping strategies training    Plan R attention, RUE GMC (ataxia), standing balance with weight shifting    OT Home Exercise Plan theraputty    Consulted and Agree with Plan of Care Patient    Family Member Consulted husband             Patient will benefit from skilled therapeutic intervention in order to improve the following deficits and impairments:   Body Structure / Function / Physical Skills: ADL, Balance, Coordination, Decreased knowledge of precautions, Continence, Decreased knowledge of use of DME, Dexterity, Endurance, Flexibility, FMC, GMC, IADL, Mobility, Pain, ROM, Strength, UE functional use, Vision Cognitive Skills: Attention, Memory, Problem Solve, Safety Awareness, Sequencing     Visit Diagnosis: Hemiplegia and hemiparesis following cerebral infarction affecting right dominant side (HCC)  Muscle weakness (generalized)  Unsteadiness on feet  Other symptoms and signs involving cognitive functions following cerebral infarction    Problem List Patient Active Problem List   Diagnosis Date Noted   CVA  (cerebral vascular accident) ([place]) 07/09/2021   Stroke-like symptoms 07/08/2021   Benign essential HTN    Dyslipidemia    Prediabetes 06/27/2021   Hyperlipidemia, unspecified 06/27/2021   Stroke ([place]) 06/26/2021   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, [place], [place], [address] Phone: [phone]   Fax:  [phone]  Name: Sandra Becker MRN: [id number] Date of Birth: 11-21-1940

## 2021-08-02 ENCOUNTER — Encounter: Payer: Self-pay | Admitting: Physical Therapy

## 2021-08-02 ENCOUNTER — Ambulatory Visit: Payer: Medicare Other | Admitting: Occupational Therapy

## 2021-08-02 ENCOUNTER — Encounter: Payer: Self-pay | Admitting: Occupational Therapy

## 2021-08-02 ENCOUNTER — Ambulatory Visit: Payer: Medicare Other | Admitting: Physical Therapy

## 2021-08-02 ENCOUNTER — Other Ambulatory Visit: Payer: Self-pay

## 2021-08-02 DIAGNOSIS — I69351 Hemiplegia and hemiparesis following cerebral infarction affecting right dominant side: Secondary | ICD-10-CM

## 2021-08-02 DIAGNOSIS — I69318 Other symptoms and signs involving cognitive functions following cerebral infarction: Secondary | ICD-10-CM

## 2021-08-02 DIAGNOSIS — R4701 Aphasia: Secondary | ICD-10-CM | POA: Diagnosis not present

## 2021-08-02 DIAGNOSIS — R2681 Unsteadiness on feet: Secondary | ICD-10-CM | POA: Diagnosis not present

## 2021-08-02 DIAGNOSIS — R2689 Other abnormalities of gait and mobility: Secondary | ICD-10-CM

## 2021-08-02 DIAGNOSIS — M6281 Muscle weakness (generalized): Secondary | ICD-10-CM

## 2021-08-02 NOTE — Therapy (Signed)
[facility] [address], [place], [place], [address] Phone: [phone]   Fax:  [phone]  Physical Therapy Treatment  Patient Details  Name: Sandra Becker MRN: [id number] Date of Birth: 1941-02-01 Referring Provider (PT): Bonnielee Haff, MD   Encounter Date: 08/02/2021   PT End of Session - 08/02/21 1234     Visit Number 4    Number of Visits 17    Date for PT Re-Evaluation 09/13/21    Authorization Type Medicare/AARP    PT Start Time 1102    PT Stop Time 1142    PT Time Calculation (min) 40 min    Equipment Utilized During Treatment Gait belt    Activity Tolerance Patient limited by fatigue    Behavior During Therapy Flat affect             Past Medical History:  Diagnosis Date   Breast cancer ([place]) 1993   bilateral mastectomy; no chemo or radiation   Glaucoma    Hyperlipidemia    Hypertension     Past Surgical History:  Procedure Laterality Date   MASTECTOMY Bilateral [place]    There were no vitals filed for this visit.   Subjective Assessment - 08/02/21 1102     Subjective Doing okay. Having some pain in the hip.    Patient is accompained by: Family member    Pertinent History breast CA with B mastectomy, HLD, HTN    Diagnostic tests 06/25/21 brain MRI showed acute infarct left basal ganglia; 07/08/21 brain MRI showed interval expansions of L basal ganglia infarct    Patient Stated Goals "work on everything" including stability and walking tolerance    Currently in Pain? Yes    Pain Score 3     Pain Location Hip    Pain Orientation Left    Pain Descriptors / Indicators Aching    Pain Type Acute pain;Chronic pain                               [facility] Adult PT Treatment/Exercise - 08/02/21 0001       Ambulation/Gait   Ambulation/Gait Assistance 4: Min guard    Ambulation Distance (Feet) 172 Feet    Assistive  device Straight cane   SPC and cane with quad tip   Gait Pattern Step-to pattern;Step-through pattern;Decreased step length - right;Decreased step length - left;Lateral trunk lean to right;Trunk flexed    Ambulation Surface Level;Indoor    Gait velocity --   WNL   Gait Comments manual and verbal cues for cane sequencing; intermittent breaks to slow down to improve coordination of stepping      Neuro Re-ed    Neuro Re-ed Details  alt toe tap on 4" and 6" step with 1 HHA 3x10      Knee/Hip Exercises: Aerobic   Nustep L3 x 6 min (UEs/LEs)      Knee/Hip Exercises: Standing   Functional Squat 10 reps;2 sets    Functional Squat Limitations in II bars   2nd set encouraged to loosen grip     Knee/Hip Exercises: Seated   Sit to [place] without UE support;2 sets;5 reps   5x min A and cues for set up; 5x CGA sitting on 1 airex pad  PT Education - 08/02/21 1233     Education Details Spoke with husband about importance of increasing ambulation tolerance with RW and importance for use of AD with gait for max safety; advised that RW will be safest device for patient at this time    Person(s) Educated Spouse    Methods Explanation    Comprehension Verbalized understanding              PT Short Term Goals - 07/25/21 1012       PT SHORT TERM GOAL #1   Title Patient to perform HEP with family supervision for improved strength, balance, transfers, posture, and mobility.    Time 3    Period Weeks    Status On-going    Target Date 08/09/21               PT Long Term Goals - 07/25/21 1013       PT LONG TERM GOAL #1   Title Patient to perform advanced HEP with family supervision for improved strength, balance, transfers, posture, and mobility.    Time 8    Period Weeks    Status On-going    Target Date 09/13/21      PT LONG TERM GOAL #2   Title Patient to complete TUG in <30 sec with LRAD in order to decrease risk of falls.    Time 8    Period Weeks     Status On-going    Target Date 09/13/21      PT LONG TERM GOAL #3   Title Patient to demonstrate 5xSTS test in <20 sec in order to decrease risk of falls.    Time 8    Period Weeks    Status On-going    Target Date 09/13/21      PT LONG TERM GOAL #4   Title Patient to demonstrate B LE strength >/=4/5.    Time 8    Period Weeks    Status On-going    Target Date 08/30/21      PT LONG TERM GOAL #5   Title Patient/family to report tolerance for ambulating to dining fall with LRAD with good safety and no fatigue limiting.    Time 8    Period Weeks    Status On-going    Target Date 09/13/21                   Plan - 08/02/21 1234     Clinical Impression Statement Patient arrived to session with report of continued mild L hip pain. Reviewed STS transfers without UE support. Patient required min A and cueing for proper set up. Better success with slightly elevated seat. Able to initiate toe taps with 1 HHA for stability; patient required cues to adjust foot positioning in order to prevent catching toes on step. Gait training with SPC and cane with quad tip was performed with CGA. Spoke with husband about importance of increasing ambulation tolerance with RW and importance for use of AD with gait for max safety. At this time RW will be safest device for patient. Intermittent sitting rest breaks taken d/t fatigue however hip pain did not increase with todays activities. Patient otherwise tolerated session well and without complaints upon leaving.    Comorbidities breast CA with B mastectomy, HLD, HTN    PT Treatment/Interventions ADLs/Self Care Home Management;Canalith Repostioning;Cryotherapy;Electrical Stimulation;DME Instruction;Ultrasound;Moist Heat;Iontophoresis 4mg /ml Dexamethasone;Gait training;Stair training;Functional mobility training;Therapeutic activities;Therapeutic exercise;Balance training;Neuromuscular re-education;Manual techniques;Patient/family education;Passive  range of motion;Dry needling;Energy conservation;Vestibular;Taping  PT Next Visit Plan progress STS transfers, gait with RW, R hip stretching and STM, balance activities; add to HEP as appropriate    Consulted and Agree with Plan of Care Patient;Family member/caregiver    Family Member Consulted husband (in lobby)             Patient will benefit from skilled therapeutic intervention in order to improve the following deficits and impairments:  Abnormal gait, Decreased range of motion, Difficulty walking, Increased fascial restricitons, Decreased endurance, Decreased safety awareness, Dizziness, Increased muscle spasms, Pain, Decreased activity tolerance, Decreased balance, Impaired flexibility, Improper body mechanics, Postural dysfunction, Decreased strength  Visit Diagnosis: Muscle weakness (generalized)  Unsteadiness on feet  Other abnormalities of gait and mobility     Problem List Patient Active Problem List   Diagnosis Date Noted   CVA (cerebral vascular accident) ([place]) 07/09/2021   Stroke-like symptoms 07/08/2021   Benign essential HTN    Dyslipidemia    Prediabetes 06/27/2021   Hyperlipidemia, unspecified 06/27/2021   Stroke ([place]) 06/26/2021   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 [place], [place], [address] Phone: [phone]   Fax:  [phone]  Name: Sandra Becker MRN: [id number] Date of Birth: 12/13/1940

## 2021-08-02 NOTE — Therapy (Signed)
[facility] [address] [place], [place], [address] Phone: [phone]   Fax:  [phone]  Occupational Therapy Treatment  Patient Details  Name: Sandra Becker MRN: [id number] Date of Birth: March 12, 1941 Referring Provider (OT): Bonnielee Haff, MD   Encounter Date: 08/02/2021   OT End of Session - 08/02/21 1153     Visit Number 4    Number of Visits 17    Date for OT Re-Evaluation 09/13/21    Authorization Type Medicare A and B    Authorization - Visit Number 4    Authorization - Number of Visits 10    Progress Note Due on Visit 10    OT Start Time 1149    OT Stop Time 1233    OT Time Calculation (min) 44 min    Activity Tolerance Patient tolerated treatment well    Behavior During Therapy Flat affect             Past Medical History:  Diagnosis Date   Breast cancer ([place]) 1993   bilateral mastectomy; no chemo or radiation   Glaucoma    Hyperlipidemia    Hypertension     Past Surgical History:  Procedure Laterality Date   MASTECTOMY Bilateral [place]    There were no vitals filed for this visit.   Subjective Assessment - 08/02/21 1152     Subjective  Pt reports still not sleeping well.    Patient is accompanied by: Family member   husband and son   Pertinent History breast cancer s/p bilateral mastectomy, glaucoma, HTN, and TIA    Patient Stated Goals "to improve in all areas"    Currently in Pain? Yes    Pain Score 3     Pain Location Hip    Pain Orientation Left    Pain Descriptors / Indicators Aching    Pain Type Acute pain;Chronic pain              Visual scanning and attention task with pen and paper activity.  Pt able to scan horizontally L to R to correctly identify stimulus per instruction.  Pt with 2 errors, both in R visual field. Engaged in word search puzzle with locating horizontal words only.  Increased challenge to  vertical words.  With increased challenge, pt with increased difficulty even to the point of loosing previous successful strategies.  Educated on use of strategies for challenging tasks and breaking tasks down to more achievable level to increase success and progress towards goals/tasks.    Dynamic standing balance with [place] incorporating reaching across midline and mildly outside BOS to challenge balance reactions and endurance.  Utilized RUE with reaching and stacking with no ataxia noted this session.  Pt with good weight shifting, requiring CGA for increased weight shift/reach.  Finger and whole hand grasp with light resistance digiflex with focus on coordination and strength while challenging memory to recall pattern.  Pt visibly frustrated with task, not recalling pattern.  Sit > stand blocked practice with focus on hand placement and weight shift as needed for increased power up.  Attempted without use of hands, requiring mod assist for sit > stand due to decreased anterior weight shift.                     OT Education - 08/02/21 1238     Education Details Educated on breaking tasks down to  more achievable "chunks" to increase success and progress towards completion of task.  Also educated on stopping and starting over as pt tends to "shut down" as she fatigues or gets frustated.    Person(s) Educated Patient;Spouse    Methods Explanation    Comprehension Verbalized understanding              OT Short Term Goals - 07/27/21 0856       OT SHORT TERM GOAL #1   Title Pt will demonstrate increased fine motor coordination for utensil use with reports of decreased spillage    Time 4    Period Weeks    Status On-going    Target Date 08/16/21      OT SHORT TERM GOAL #2   Title Pt will complete sit > stand over 3 different sessions with supervision to increase independence with toilet transfers.    Time 4    Period Weeks    Status On-going      OT SHORT TERM GOAL #3    Title Pt will be indepedent in use of HEP for [facility]    Time 4    Period Weeks    Status On-going      OT SHORT TERM GOAL #4   Title Pt will demonstrate improved RUE functional use for ADLs as evidenced by increasing box/ blocks score by 5 blocks    Baseline R: 20 and L: 30    Time 4    Period Weeks    Status On-going               OT Long Term Goals - 07/27/21 0857       OT LONG TERM GOAL #1   Title Pt will demonstrate improved fine motor coordination for ADLs as evidenced by decreasing 9 hole peg test score for RUE by 15 secs    Baseline R: 58.79 and L: 34.81    Time 8    Period Weeks    Status On-going    Target Date 09/13/21      OT LONG TERM GOAL #2   Title Pt will demonstrate improved [place] as needed for handwriting by writing grocery list with 100% legibility utilizing adaptive techniques as needed.    Time 8    Period Weeks    Status On-going      OT LONG TERM GOAL #3   Title Pt will demonstate understanding of energy conservation strategies to increase safe participation in ADLs and IADLs.    Time 8    Period Weeks    Status On-going      OT LONG TERM GOAL #4   Title Pt will report increased ability to complete toilet transfers without physical assistance from caregiver.    Time 8    Period Weeks    Status On-going                   Plan - 08/02/21 1153     Clinical Impression Statement Pt continues to have flat affect.  Pt demonstrated incrased "freezing" and "perseveration" as pt with difficulty moving forward or problem solving as the challenge increased and pt fatigued.  Educated on stopping task, if safe, and starting over and/or breaking the task into more managable "chunks" to increase success.  Pt continues to rely on BUE support with sit > stand despite cues and blocked practice with sit > stand.  CGA for standing balance and stand pivot transfers.  Pt receptive to cues and instructions during  all structured tasks.    OT Occupational  Profile and History Detailed Assessment- Review of Records and additional review of physical, cognitive, psychosocial history related to current functional performance    Occupational performance deficits (Please refer to evaluation for details): ADL's;IADL's;Leisure    Body Structure / Function / Physical Skills ADL;Balance;Coordination;Decreased knowledge of precautions;Continence;Decreased knowledge of use of DME;Dexterity;Endurance;Flexibility;FMC;GMC;IADL;Mobility;Pain;ROM;Strength;UE functional use;Vision    Cognitive Skills Attention;Memory;Problem Solve;Safety Awareness;Sequencing    Rehab Potential Good    Clinical Decision Making Limited treatment options, no task modification necessary    Comorbidities Affecting Occupational Performance: May have comorbidities impacting occupational performance    Modification or Assistance to Complete Evaluation  No modification of tasks or assist necessary to complete eval    OT Frequency 2x / week    OT Duration 8 weeks    OT Treatment/Interventions Self-care/ADL training;Cryotherapy;Electrical Stimulation;Moist Heat;Ultrasound;Iontophoresis;Therapeutic exercise;Neuromuscular education;Energy conservation;DME and/or AE instruction;Therapist, nutritional;Therapeutic activities;Cognitive remediation/compensation;Visual/perceptual remediation/compensation;Patient/family education;Balance training;Psychosocial skills training;Coping strategies training    Plan R attention, RUE GMC (ataxia), standing balance with weight shifting, sit > stand    OT Home Exercise Plan --    Consulted and Agree with Plan of Care Patient    Family Member Consulted husband             Patient will benefit from skilled therapeutic intervention in order to improve the following deficits and impairments:   Body Structure / Function / Physical Skills: ADL, Balance, Coordination, Decreased knowledge of precautions, Continence, Decreased knowledge of use of DME, Dexterity,  Endurance, Flexibility, FMC, GMC, IADL, Mobility, Pain, ROM, Strength, UE functional use, Vision Cognitive Skills: Attention, Memory, Problem Solve, Safety Awareness, Sequencing     Visit Diagnosis: No diagnosis found.    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, [place], [place], [address] Phone: [phone]   Fax:  [phone]  Name: MELYNDA KRZYWICKI MRN: [id number] Date of Birth: 1941/06/27

## 2021-08-06 ENCOUNTER — Encounter: Payer: Self-pay | Admitting: Physical Therapy

## 2021-08-06 ENCOUNTER — Encounter: Payer: Self-pay | Admitting: Occupational Therapy

## 2021-08-06 ENCOUNTER — Ambulatory Visit: Payer: Medicare Other | Admitting: Physical Therapy

## 2021-08-06 ENCOUNTER — Ambulatory Visit: Payer: Medicare Other

## 2021-08-06 ENCOUNTER — Other Ambulatory Visit: Payer: Self-pay

## 2021-08-06 ENCOUNTER — Telehealth: Payer: Self-pay | Admitting: *Deleted

## 2021-08-06 ENCOUNTER — Ambulatory Visit: Payer: Medicare Other | Admitting: Occupational Therapy

## 2021-08-06 DIAGNOSIS — I69351 Hemiplegia and hemiparesis following cerebral infarction affecting right dominant side: Secondary | ICD-10-CM

## 2021-08-06 DIAGNOSIS — R2681 Unsteadiness on feet: Secondary | ICD-10-CM | POA: Diagnosis not present

## 2021-08-06 DIAGNOSIS — R41841 Cognitive communication deficit: Secondary | ICD-10-CM

## 2021-08-06 DIAGNOSIS — M6281 Muscle weakness (generalized): Secondary | ICD-10-CM

## 2021-08-06 DIAGNOSIS — I69318 Other symptoms and signs involving cognitive functions following cerebral infarction: Secondary | ICD-10-CM | POA: Diagnosis not present

## 2021-08-06 DIAGNOSIS — R2689 Other abnormalities of gait and mobility: Secondary | ICD-10-CM

## 2021-08-06 DIAGNOSIS — I639 Cerebral infarction, unspecified: Secondary | ICD-10-CM

## 2021-08-06 DIAGNOSIS — R4701 Aphasia: Secondary | ICD-10-CM | POA: Diagnosis not present

## 2021-08-06 DIAGNOSIS — F320 Major depressive disorder, single episode, mild: Secondary | ICD-10-CM

## 2021-08-06 NOTE — Therapy (Signed)
[facility] [address] [place], [place], [address] Phone: [phone]   Fax:  [phone]  Physical Therapy Treatment  Patient Details  Name: Sandra Becker MRN: [id number] Date of Birth: [birth year] Referring Provider (PT): Bonnielee Haff, MD   Encounter Date: 08/06/2021   PT End of Session - 08/06/21 1239     Visit Number 5    Number of Visits 17    Date for PT Re-Evaluation 09/13/21    Authorization Type Medicare/AARP    PT Start Time 1015    PT Stop Time 1059    PT Time Calculation (min) 44 min    Equipment Utilized During Treatment Gait belt    Activity Tolerance Patient tolerated treatment well    Behavior During Therapy Flat affect             Past Medical History:  Diagnosis Date   Breast cancer ([place]) 1993   bilateral mastectomy; no chemo or radiation   Glaucoma    Hyperlipidemia    Hypertension     Past Surgical History:  Procedure Laterality Date   MASTECTOMY Bilateral [place]    There were no vitals filed for this visit.   Subjective Assessment - 08/06/21 1017     Subjective Husband reports that the patient was able to walk 4x in the hallway over the weekend. Patient reports that the hip bothered her when she walked.    Pertinent History breast CA with B mastectomy, HLD, HTN    Diagnostic tests 06/25/21 brain MRI showed acute infarct left basal ganglia; 07/08/21 brain MRI showed interval expansions of L basal ganglia infarct    Patient Stated Goals "work on everything" including stability and walking tolerance    Currently in Pain? Yes    Pain Score 3     Pain Location Hip    Pain Orientation Left    Pain Descriptors / Indicators Aching    Pain Type Acute pain;Chronic pain                               [facility] Adult PT Treatment/Exercise - 08/06/21 0001       Ambulation/Gait   Ambulation/Gait Assistance 4:  Min guard    Ambulation Distance (Feet) 258 Feet    Assistive device Straight cane   cane with quad tip   Gait Pattern Step-to pattern;Step-through pattern;Decreased step length - right;Decreased step length - left;Lateral trunk lean to right;Trunk flexed    Ambulation Surface Level;Indoor    Gait Comments manual and verbal cues for cane sequencing; intermittent breaks to slow down to improve coordination of stepping   pt with better coordination of cane in L rather than R hand     Neuro Re-ed    Neuro Re-ed Details  alt toe tap on 4" and 6" step with 1 HHA 3x10; R/L step up with 1 UE support 6" 10x R, 5x L   more challenge with L step ups     Knee/Hip Exercises: Aerobic   Nustep L3 x 3 min, L4 x 3 min (UEs/LEs)      Knee/Hip Exercises: Seated   Sit to Sand without UE support;5 reps;3 sets   PT holding pt's arms to assist in forward translation of trunk  PT Education - 08/06/21 1238     Education Details discussion with patient and husband about patient's low mood and frustration during therapy- patient and husband both in agreement to contact MD about these concerns    Person(s) Educated Patient;Spouse    Methods Explanation    Comprehension Verbalized understanding              PT Short Term Goals - 08/06/21 1242       PT SHORT TERM GOAL #1   Title Patient to perform HEP with family supervision for improved strength, balance, transfers, posture, and mobility.    Time 3    Period Weeks    Status Achieved    Target Date 08/09/21               PT Long Term Goals - 07/25/21 1013       PT LONG TERM GOAL #1   Title Patient to perform advanced HEP with family supervision for improved strength, balance, transfers, posture, and mobility.    Time 8    Period Weeks    Status On-going    Target Date 09/13/21      PT LONG TERM GOAL #2   Title Patient to complete TUG in <30 sec with LRAD in order to decrease risk of falls.    Time 8     Period Weeks    Status On-going    Target Date 09/13/21      PT LONG TERM GOAL #3   Title Patient to demonstrate 5xSTS test in <20 sec in order to decrease risk of falls.    Time 8    Period Weeks    Status On-going    Target Date 09/13/21      PT LONG TERM GOAL #4   Title Patient to demonstrate B LE strength >/=4/5.    Time 8    Period Weeks    Status On-going    Target Date 08/30/21      PT LONG TERM GOAL #5   Title Patient/family to report tolerance for ambulating to dining fall with LRAD with good safety and no fatigue limiting.    Time 8    Period Weeks    Status On-going    Target Date 09/13/21                   Plan - 08/06/21 1240     Clinical Impression Statement Patient arrived to session with husband; husband reporting that the patient was able to walk 4x in the hallway. Patient noted some L hip pain with this. Continued working on STS transfers from elevated surface. Patient demonstrated poor carryover of proper transfer set up and technique; improved form and success after providing manual assistance to encourage forward translation of trunk. Spoke to patient about mood changes since her stroke as she appears to have flat/depressed affect and appears to become frustrated during sessions. Patient reports that she is agreeable to medication and willing to talk to MD about this. Discussed with patients husband, who reports that he plans to call the patients MD. Worked on gait training with quad tip cane, with cues to slow down to improve cane sequencing. Patient with most success with cane in L hand however will require more practice. Patient without complaints at end of session.    Comorbidities breast CA with B mastectomy, HLD, HTN    PT Treatment/Interventions ADLs/Self Care Home Management;Canalith Repostioning;Cryotherapy;Electrical Stimulation;DME Instruction;Ultrasound;Moist Heat;Iontophoresis 4mg /ml Dexamethasone;Gait training;Stair training;Functional  mobility training;Therapeutic activities;Therapeutic  exercise;Balance training;Neuromuscular re-education;Manual techniques;Patient/family education;Passive range of motion;Dry needling;Energy conservation;Vestibular;Taping    PT Next Visit Plan progress STS transfers, gait with RW, R hip stretching and STM, balance activities; add to HEP as appropriate    Consulted and Agree with Plan of Care Patient;Family member/caregiver    Family Member Consulted husband (in lobby)             Patient will benefit from skilled therapeutic intervention in order to improve the following deficits and impairments:  Abnormal gait, Decreased range of motion, Difficulty walking, Increased fascial restricitons, Decreased endurance, Decreased safety awareness, Dizziness, Increased muscle spasms, Pain, Decreased activity tolerance, Decreased balance, Impaired flexibility, Improper body mechanics, Postural dysfunction, Decreased strength  Visit Diagnosis: Muscle weakness (generalized)  Unsteadiness on feet  Other abnormalities of gait and mobility     Problem List Patient Active Problem List   Diagnosis Date Noted   CVA (cerebral vascular accident) ([place]) 07/09/2021   Stroke-like symptoms 07/08/2021   Benign essential HTN    Dyslipidemia    Prediabetes 06/27/2021   Hyperlipidemia, unspecified 06/27/2021   Stroke ([place]) 06/26/2021   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 [place], [place], [address] Phone: [phone]   Fax:  [phone]  Name: RINA ADNEY MRN: [id number] Date of Birth: 04-27-1941

## 2021-08-06 NOTE — Therapy (Signed)
[facility] [address], [place], [place], [address] Phone: [phone]   Fax:  [phone]  Occupational Therapy Treatment  Patient Details  Name: Sandra Becker MRN: [id number] Date of Birth: [birth year] Referring Provider (OT): Bonnielee Haff, MD   Encounter Date: 08/06/2021   OT End of Session - 08/06/21 1158     Visit Number 5    Number of Visits 17    Date for OT Re-Evaluation 09/13/21    Authorization Type Medicare A and B    Authorization - Visit Number 5    Authorization - Number of Visits 10    Progress Note Due on Visit 10    OT Start Time 1149    OT Stop Time 1230    OT Time Calculation (min) 41 min    Activity Tolerance Patient tolerated treatment well    Behavior During Therapy Flat affect             Past Medical History:  Diagnosis Date   Breast cancer ([place]) 1993   bilateral mastectomy; no chemo or radiation   Glaucoma    Hyperlipidemia    Hypertension     Past Surgical History:  Procedure Laterality Date   MASTECTOMY Bilateral [place]    There were no vitals filed for this visit.   Subjective Assessment - 08/06/21 1157     Subjective  Pt reports still not sleeping well.    Patient is accompanied by: Family member   husband   Pertinent History breast cancer s/p bilateral mastectomy, glaucoma, HTN, and TIA    Patient Stated Goals "to improve in all areas"    Currently in Pain? No/denies              ADL: self-feeding task with scooping beans with R hand with focus on [facility].  Pt dropping 1 beans, however demonstrating good coordination overall.  Pt benefiting from increased focus on task and small movements, due to decreased control with larger movements (ie bowl moved away from other bowl).    ADL: bathroom transfers.  Pt required increased time and 3 attempts for sit > stand both from w/c and toilet prior to and post  toileting.  Pt with good carryover of education from previous session to stop and "reset" when stuck with a movement, completing sit > stand each time immediately after reset.  Pt required close supervision for dynamic standing with clothing management.    Dynamic standing balance/GMC/pinch strength:  Focus on weight shifting as needed for any functional movement during ADLs and IADLs while focusing on standing tolerance.  Pt retrieving rings from cones and transferring then from cone to cone with RUE while addressing weight shifting and fine and gross motor control with RUE.  Pt completed with difficulty picking up ~10%.  Increased challenge to incorporating reaching behind self to R to place rings on target.  Pt required close supervision for standing balance/tolerance up to CGA with increased trunk rotation.    [place]: picking up small stones one at a time and placing up to 5 in hand then completing translation to return one by one to finger tips and place in container.  Pt dropping 1/20 during translation.  Mod cues for sequencing and recall of instructions during [facility] task.                      OT Short Term  Goals - 07/27/21 0856       OT SHORT TERM GOAL #1   Title Pt will demonstrate increased fine motor coordination for utensil use with reports of decreased spillage    Time 4    Period Weeks    Status On-going    Target Date 08/16/21      OT SHORT TERM GOAL #2   Title Pt will complete sit > stand over 3 different sessions with supervision to increase independence with toilet transfers.    Time 4    Period Weeks    Status On-going      OT SHORT TERM GOAL #3   Title Pt will be indepedent in use of HEP for [facility]    Time 4    Period Weeks    Status On-going      OT SHORT TERM GOAL #4   Title Pt will demonstrate improved RUE functional use for ADLs as evidenced by increasing box/ blocks score by 5 blocks    Baseline R: 20 and L: 30    Time 4    Period Weeks    Status  On-going               OT Long Term Goals - 07/27/21 0857       OT LONG TERM GOAL #1   Title Pt will demonstrate improved fine motor coordination for ADLs as evidenced by decreasing 9 hole peg test score for RUE by 15 secs    Baseline R: 58.79 and L: 34.81    Time 8    Period Weeks    Status On-going    Target Date 09/13/21      OT LONG TERM GOAL #2   Title Pt will demonstrate improved [place] as needed for handwriting by writing grocery list with 100% legibility utilizing adaptive techniques as needed.    Time 8    Period Weeks    Status On-going      OT LONG TERM GOAL #3   Title Pt will demonstate understanding of energy conservation strategies to increase safe participation in ADLs and IADLs.    Time 8    Period Weeks    Status On-going      OT LONG TERM GOAL #4   Title Pt will report increased ability to complete toilet transfers without physical assistance from caregiver.    Time 8    Period Weeks    Status On-going                   Plan - 08/06/21 1159     Clinical Impression Statement Pt continues to have flat affect, possible depression impacting active participation in therapy sessions. Pt demonstrating good carryover of previous education with "reset" to start over when getting stuck with sit > stand in particular.  Pt continues to rely on BUE support with sit > stand despite cues and blocked practice with sit > stand.  CGA for standing balance and stand pivot transfers.  Pt receptive to cues and instructions during all structured tasks.    OT Occupational Profile and History Detailed Assessment- Review of Records and additional review of physical, cognitive, psychosocial history related to current functional performance    Occupational performance deficits (Please refer to evaluation for details): ADL's;IADL's;Leisure    Body Structure / Function / Physical Skills ADL;Balance;Coordination;Decreased knowledge of precautions;Continence;Decreased knowledge  of use of DME;Dexterity;Endurance;Flexibility;FMC;GMC;IADL;Mobility;Pain;ROM;Strength;UE functional use;Vision    Cognitive Skills Attention;Memory;Problem Solve;Safety Awareness;Sequencing    Rehab Potential Good  Clinical Decision Making Limited treatment options, no task modification necessary    Comorbidities Affecting Occupational Performance: May have comorbidities impacting occupational performance    Modification or Assistance to Complete Evaluation  No modification of tasks or assist necessary to complete eval    OT Frequency 2x / week    OT Duration 8 weeks    OT Treatment/Interventions Self-care/ADL training;Cryotherapy;Electrical Stimulation;Moist Heat;Ultrasound;Iontophoresis;Therapeutic exercise;Neuromuscular education;Energy conservation;DME and/or AE instruction;Therapist, nutritional;Therapeutic activities;Cognitive remediation/compensation;Visual/perceptual remediation/compensation;Patient/family education;Balance training;Psychosocial skills training;Coping strategies training    Plan R attention, RUE GMC (ataxia), standing balance with weight shifting, sit > stand    Consulted and Agree with Plan of Care Patient    Family Member Consulted husband             Patient will benefit from skilled therapeutic intervention in order to improve the following deficits and impairments:   Body Structure / Function / Physical Skills: ADL, Balance, Coordination, Decreased knowledge of precautions, Continence, Decreased knowledge of use of DME, Dexterity, Endurance, Flexibility, FMC, GMC, IADL, Mobility, Pain, ROM, Strength, UE functional use, Vision Cognitive Skills: Attention, Memory, Problem Solve, Safety Awareness, Sequencing     Visit Diagnosis: Muscle weakness (generalized)  Unsteadiness on feet  Hemiplegia and hemiparesis following cerebral infarction affecting right dominant side (HCC)  Other symptoms and signs involving cognitive functions following cerebral  infarction  Other abnormalities of gait and mobility    Problem List Patient Active Problem List   Diagnosis Date Noted   CVA (cerebral vascular accident) ([place]) 07/09/2021   Stroke-like symptoms 07/08/2021   Benign essential HTN    Dyslipidemia    Prediabetes 06/27/2021   Hyperlipidemia, unspecified 06/27/2021   Stroke ([place]) 06/26/2021   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, [place], [place], [address] Phone: [phone]   Fax:  [phone]  Name: Sandra Becker MRN: [id number] Date of Birth: 02-12-1941

## 2021-08-06 NOTE — Telephone Encounter (Signed)
Sandra Becker called to report that [name] is suffering from a depressive mood and it is making things very difficult. Please advise.

## 2021-08-07 MED ORDER — CITALOPRAM HYDROBROMIDE 10 MG PO TABS: 10.0000 mg | ORAL_TABLET | Freq: Every day | ORAL | 2 refills | Status: DC

## 2021-08-07 NOTE — Telephone Encounter (Addendum)
I left a VM for Sandra Becker on his mobile and one on home # alerting him to check his messages. Referral made to Dr Sima Matas and Citalopram 10 mg q day 2 Rf sent to pharmacy. She has appt with Dr Letta Pate on 08/30/21.

## 2021-08-07 NOTE — Therapy (Signed)
[facility] [address] [place], [place], [address] Phone: [phone]   Fax:  [phone]  Speech Language Pathology Treatment  Patient Details  Name: Sandra Becker MRN: [id number] Date of Birth: [birth year] Referring Provider (SLP): Leonides Sake (ref), Kelton Pillar, MD (doc)   Encounter Date: 08/06/2021   End of Session - 08/07/21 1313     Visit Number 2    Number of Visits 25    Date for SLP Re-Evaluation 10/17/21    SLP Start Time 1103    SLP Stop Time  1145    SLP Time Calculation (min) 42 min    Activity Tolerance Patient tolerated treatment well             Past Medical History:  Diagnosis Date   Breast cancer ([place]) 1993   bilateral mastectomy; no chemo or radiation   Glaucoma    Hyperlipidemia    Hypertension     Past Surgical History:  Procedure Laterality Date   MASTECTOMY Bilateral [place]    There were no vitals filed for this visit.   Subjective Assessment - 08/06/21 1108     Patient is accompained by: Family member   husband   Currently in Pain? Yes    Pain Score 3     Pain Location Hip    Pain Orientation Left                   ADULT SLP TREATMENT - 08/07/21 0001       General Information   Behavior/Cognition Alert;Cooperative;Requires cueing;Agitated      Treatment Provided   Treatment provided Cognitive-Linquistic      Cognitive-Linquistic Treatment   Treatment focused on Aphasia    Skilled Treatment SLP educated husband on pt's intact reading skill today SLP performed Quick Aphasia Battery (QAB) with pt today. Scores as follows: Word comprehension 7.5, sentence comprehension 8.33, word finding 3.0, grammatical construction 6.0, speech motor programming 0.0, repetition 10, reading 10, OVERALL: 5.92 - MODERATE aphasia. SLP educated pt's husband for how to assist pt at home - told husband to write down words  in a spiral notebook and may want to categorize them in case pt needs to look at word to read it in order to compensate. Husband acknowledged understanding.      Assessment / Recommendations / Plan   Plan Continue with current plan of care      Progression Toward Goals   Progression toward goals Progressing toward goals              SLP Education - 08/07/21 1313     Education Details home tasks, compensatory strategies revolving around reading    Person(s) Educated Patient;Spouse    Methods Explanation;Demonstration    Comprehension Verbalized understanding              SLP Short Term Goals - 08/07/21 1319       SLP SHORT TERM GOAL #1   Title pt will verbalize 10 personally relevant words (family names, etc) 80% success x3 sessions    Time 4    Period Weeks    Status New    Target Date 08/17/21      SLP SHORT TERM GOAL #2   Title pt will request repeat if necessary in 90% of opportunities in 3 sessions    Time 4    Period Weeks    Status  New    Target Date 08/17/21      SLP SHORT TERM GOAL #3   Title pt will undergo standardized aphasia assessment    Status Achieved    Target Date 08/03/21      SLP SHORT TERM GOAL #4   Title [name] will complete a language based QOL measure    Time 2    Period Weeks    Status New    Target Date 08/03/21              SLP Long Term Goals - 08/07/21 1320       SLP LONG TERM GOAL #1   Title in the last 1-2 weeks of ST pt will score higher/better on a language based QOL measure than the first 2 weeks of ST    Time 12    Period Weeks    Status New    Target Date 10/17/21      SLP LONG TERM GOAL #2   Title pt will verbalize subject verb object when describing pictures 90% of the time in 3 sessions    Time 8    Period Weeks    Status New    Target Date 09/14/21      SLP LONG TERM GOAL #3   Title pt will demo understanding of simple conversation of 10 minutes with modified independence    Time 8    Period Weeks     Status New    Target Date 09/14/21      SLP LONG TERM GOAL #4   Title [name] will use verbalization for wants/needs in 85% of opportunities between 3 sessions, as reported by pt/husband    Time 8    Period Weeks    Status New    Target Date 09/14/21      SLP LONG TERM GOAL #5   Title pt will functionally participate in 10 minutes simple conversation x3 sessions    Time 12    Period Weeks    Status New    Target Date 10/17/21              Plan - 08/07/21 1314     Clinical Impression Statement Pt presents today with at moderate aphasia (receptive better than expressive) as tested by Quick Aphasia Battery (QAB). Pt was successful with spontaneous description today in picture description but was unable to name 3/6 items without phonemic cues, nor was she able to construct sentences > average 3.33 MLU about a salient topic (wedding day/honeymoon).  SLP believes pt will benefit from skilled ST targeting receptive and expressive language to be able to communicate effectively at home and in the community.    Speech Therapy Frequency 2x / week    Duration 12 weeks    Treatment/Interventions Environmental controls;Functional tasks;Compensatory techniques;Multimodal communcation approach;SLP instruction and feedback;Cueing hierarchy;Language facilitation;Cognitive reorganization;Patient/family education;Internal/external aids    Potential to Achieve Goals Good    Consulted and Agree with Plan of Care Patient;Family member/caregiver    Family Member Consulted husband             Patient will benefit from skilled therapeutic intervention in order to improve the following deficits and impairments:   Aphasia  Cognitive communication deficit    Problem List Patient Active Problem List   Diagnosis Date Noted   CVA (cerebral vascular accident) ([place]) 07/09/2021   Stroke-like symptoms 07/08/2021   Benign essential HTN    Dyslipidemia    Prediabetes 06/27/2021   Hyperlipidemia,  unspecified 06/27/2021  Stroke ([place]) 06/26/2021   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 [place], [place], [address] Phone: [phone]   Fax:  [phone]   Name: AMBUR PROVINCE MRN: [id number] Date of Birth: 09-09-1940

## 2021-08-08 ENCOUNTER — Ambulatory Visit: Payer: Medicare Other | Admitting: Occupational Therapy

## 2021-08-08 ENCOUNTER — Ambulatory Visit: Payer: Medicare Other | Admitting: Physical Therapy

## 2021-08-08 ENCOUNTER — Ambulatory Visit: Payer: Medicare Other

## 2021-08-08 ENCOUNTER — Encounter: Payer: Self-pay | Admitting: Physical Therapy

## 2021-08-08 ENCOUNTER — Other Ambulatory Visit: Payer: Self-pay

## 2021-08-08 ENCOUNTER — Telehealth: Payer: Self-pay

## 2021-08-08 ENCOUNTER — Encounter: Payer: Medicare Other | Admitting: Occupational Therapy

## 2021-08-08 DIAGNOSIS — R2689 Other abnormalities of gait and mobility: Secondary | ICD-10-CM

## 2021-08-08 DIAGNOSIS — R4701 Aphasia: Secondary | ICD-10-CM | POA: Diagnosis not present

## 2021-08-08 DIAGNOSIS — I69351 Hemiplegia and hemiparesis following cerebral infarction affecting right dominant side: Secondary | ICD-10-CM | POA: Diagnosis not present

## 2021-08-08 DIAGNOSIS — R2681 Unsteadiness on feet: Secondary | ICD-10-CM

## 2021-08-08 DIAGNOSIS — M6281 Muscle weakness (generalized): Secondary | ICD-10-CM

## 2021-08-08 DIAGNOSIS — I69318 Other symptoms and signs involving cognitive functions following cerebral infarction: Secondary | ICD-10-CM | POA: Diagnosis not present

## 2021-08-08 DIAGNOSIS — R41841 Cognitive communication deficit: Secondary | ICD-10-CM

## 2021-08-08 NOTE — Therapy (Signed)
[facility] [address] [place], [place], [address] Phone: [phone]   Fax:  [phone]  Speech Language Pathology Treatment  Patient Details  Name: Sandra Becker MRN: [id number] Date of Birth: 1941/01/30 Referring Provider (SLP): Leonides Sake (ref), Kelton Pillar, MD (doc)   Encounter Date: 08/08/2021   End of Session - 08/08/21 1402     Visit Number 3    Number of Visits 25    Date for SLP Re-Evaluation 10/17/21    SLP Start Time 1017    SLP Stop Time  1100    SLP Time Calculation (min) 43 min    Activity Tolerance Patient tolerated treatment well             Past Medical History:  Diagnosis Date   Breast cancer ([place]) 1993   bilateral mastectomy; no chemo or radiation   Glaucoma    Hyperlipidemia    Hypertension     Past Surgical History:  Procedure Laterality Date   MASTECTOMY Bilateral [place]    There were no vitals filed for this visit.   Subjective Assessment - 08/08/21 1023     Currently in Pain? Yes    Pain Score 3     Pain Location Back    Pain Orientation Lower    Pain Descriptors / Indicators Aching    Pain Type Chronic pain    Pain Onset More than a month ago    Pain Frequency Constant                   ADULT SLP TREATMENT - 08/08/21 1025       General Information   Behavior/Cognition Cooperative;Alert      Treatment Provided   Treatment provided Cognitive-Linquistic      Cognitive-Linquistic Treatment   Treatment focused on Aphasia    Skilled Treatment Pt told SLP name of the book they are reading with phonemic cue from [facility]. SLP talked about pt's family in session today to target modeling cueing strategies for husband to perform at home, and to work on salient langauge with pt. She told names of sons and states of residence, 1/2 wife names, and 2/3 grandchildrens' names without cues. This improved  to 100% with mod cues.      Assessment / Recommendations / Plan   Plan Continue with current plan of care      Progression Toward Goals   Progression toward goals Progressing toward goals              SLP Education - 08/08/21 1401     Education Details verbal expression cueing methods    Person(s) Educated Patient;Spouse    Methods Explanation;Demonstration    Comprehension Verbalized understanding;Returned demonstration;Need further instruction              SLP Short Term Goals - 08/08/21 1402       SLP SHORT TERM GOAL #1   Title pt will verbalize 10 personally relevant words (family names, etc) 80% success x3 sessions    Time 4    Period Weeks    Status On-going    Target Date 08/17/21      SLP SHORT TERM GOAL #2   Title pt will request repeat if necessary in 90% of opportunities in 3 sessions    Time 4    Period Weeks    Status On-going    Target Date  08/17/21      SLP SHORT TERM GOAL #3   Title pt will undergo standardized aphasia assessment    Status Achieved    Target Date 08/03/21      SLP SHORT TERM GOAL #4   Title [name] will complete a language based QOL measure    Time 2    Period Weeks    Status On-going    Target Date 08/03/21              SLP Long Term Goals - 08/08/21 1403       SLP LONG TERM GOAL #1   Title in the last 1-2 weeks of ST pt will score higher/better on a language based QOL measure than the first 2 weeks of ST    Time 12    Period Weeks    Status On-going    Target Date 10/17/21      SLP LONG TERM GOAL #2   Title pt will verbalize subject verb object when describing pictures 90% of the time in 3 sessions    Time 8    Period Weeks    Status On-going    Target Date 09/14/21      SLP LONG TERM GOAL #3   Title pt will demo understanding of simple conversation of 10 minutes with modified independence    Time 8    Period Weeks    Status On-going    Target Date 09/14/21      SLP LONG TERM GOAL #4   Title [name]  will use verbalization for wants/needs in 85% of opportunities between 3 sessions, as reported by pt/husband    Time 8    Period Weeks    Status New    Target Date 09/14/21      SLP LONG TERM GOAL #5   Title pt will functionally participate in 10 minutes simple conversation x3 sessions    Time 12    Period Weeks    Status On-going    Target Date 10/17/21              Plan - 08/08/21 1402     Clinical Impression Statement Pt presents today with at moderate aphasia (receptive better than expressive) as tested by Quick Aphasia Battery (QAB).  SLP cont to believe pt will benefit from skilled ST targeting receptive and expressive language to be able to communicate effectively at home and in the community.    Speech Therapy Frequency 2x / week    Duration 12 weeks    Treatment/Interventions Environmental controls;Functional tasks;Compensatory techniques;Multimodal communcation approach;SLP instruction and feedback;Cueing hierarchy;Language facilitation;Cognitive reorganization;Patient/family education;Internal/external aids    Potential to Achieve Goals Good    Consulted and Agree with Plan of Care Patient;Family member/caregiver    Family Member Consulted husband             Patient will benefit from skilled therapeutic intervention in order to improve the following deficits and impairments:   Aphasia  Cognitive communication deficit    Problem List Patient Active Problem List   Diagnosis Date Noted   CVA (cerebral vascular accident) ([place]) 07/09/2021   Stroke-like symptoms 07/08/2021   Benign essential HTN    Dyslipidemia    Prediabetes 06/27/2021   Hyperlipidemia, unspecified 06/27/2021   Stroke ([place]) 06/26/2021   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

## 2021-08-08 NOTE — Telephone Encounter (Signed)
Patient's husband called and stated the antidepressant that was sent in can be cancelled and her PCP can prescribe it. Sandra Becker to cancel prescription

## 2021-08-08 NOTE — Therapy (Signed)
[facility] [address] [place], [place], [address] Phone: [phone]   Fax:  [phone]  Physical Therapy Treatment  Patient Details  Name: Sandra Becker MRN: [id number] Date of Birth: 06/22/1941 Referring Provider (PT): Bonnielee Haff, MD   Encounter Date: 08/08/2021   PT End of Session - 08/08/21 1148     Visit Number 6    Number of Visits 17    Date for PT Re-Evaluation 09/13/21    Authorization Type Medicare/AARP    PT Start Time 1103    PT Stop Time 6962   pt in bathroom   PT Time Calculation (min) 42 min    Equipment Utilized During Treatment Gait belt    Activity Tolerance Patient tolerated treatment well    Behavior During Therapy Flat affect             Past Medical History:  Diagnosis Date   Breast cancer ([place]) 1993   bilateral mastectomy; no chemo or radiation   Glaucoma    Hyperlipidemia    Hypertension     Past Surgical History:  Procedure Laterality Date   MASTECTOMY Bilateral [place]    There were no vitals filed for this visit.   Subjective Assessment - 08/08/21 1114     Subjective Husband reports that they have continued walking at home and he has reached out to MD about patient's mood. Patient referred to neuropsych and given new medication for depression.    Patient is accompained by: Family member    Pertinent History breast CA with B mastectomy, HLD, HTN    Diagnostic tests 06/25/21 brain MRI showed acute infarct left basal ganglia; 07/08/21 brain MRI showed interval expansions of L basal ganglia infarct    Patient Stated Goals "work on everything" including stability and walking tolerance    Currently in Pain? Yes    Pain Score 3     Pain Location Hip    Pain Orientation Left;Lateral;Anterior    Pain Descriptors / Indicators Aching    Pain Type Chronic pain                               [facility]  Adult PT Treatment/Exercise - 08/08/21 0001       Ambulation/Gait   Ambulation/Gait Assistance 4: Min guard    Ambulation Distance (Feet) 172 Feet    Assistive device Straight cane    Gait Pattern Step-to pattern;Step-through pattern;Decreased step length - right;Decreased step length - left;Lateral trunk lean to right;Trunk flexed    Ambulation Surface Level;Indoor    Gait Comments improved cane sequencing today while providing only verbal cues to correct intermittently; pt able to stop and re-establish proper sequencing independently      Exercises   Exercises Ankle      Knee/Hip Exercises: Aerobic   Nustep L4 x 2.5 min   stopped short d/t L ankle pain     Knee/Hip Exercises: Seated   Sit to Sand 2 sets;5 reps;with UE support   from mat with intermittent min A to correct posterior lean/LOB, leaning nose to object on floor in between feet; hands pushing off knees; educated husband on using this technique at home     Ankle Exercises: Seated   Towel Inversion/Eversion Limitations    Towel Inversion/Eversion Limitations L ankle 10x to tolerance    Heel Raises Left;15 reps  Other Seated Ankle Exercises L ankle circles 10x CW/CCW                     PT Education - 08/08/21 1147     Education Details Advised husband to have patient practice walking into sessions using her walker instead of transport chair, advised husband to use ice on patient's L ankle ~10 min/day for edema control; edu on STS transfers for improved ease    Person(s) Educated Patient;Spouse    Methods Explanation;Demonstration;Tactile cues;Verbal cues    Comprehension Verbalized understanding;Returned demonstration              PT Short Term Goals - 08/06/21 1242       PT SHORT TERM GOAL #1   Title Patient to perform HEP with family supervision for improved strength, balance, transfers, posture, and mobility.    Time 3    Period Weeks    Status Achieved    Target Date 08/09/21                PT Long Term Goals - 07/25/21 1013       PT LONG TERM GOAL #1   Title Patient to perform advanced HEP with family supervision for improved strength, balance, transfers, posture, and mobility.    Time 8    Period Weeks    Status On-going    Target Date 09/13/21      PT LONG TERM GOAL #2   Title Patient to complete TUG in <30 sec with LRAD in order to decrease risk of falls.    Time 8    Period Weeks    Status On-going    Target Date 09/13/21      PT LONG TERM GOAL #3   Title Patient to demonstrate 5xSTS test in <20 sec in order to decrease risk of falls.    Time 8    Period Weeks    Status On-going    Target Date 09/13/21      PT LONG TERM GOAL #4   Title Patient to demonstrate B LE strength >/=4/5.    Time 8    Period Weeks    Status On-going    Target Date 08/30/21      PT LONG TERM GOAL #5   Title Patient/family to report tolerance for ambulating to dining fall with LRAD with good safety and no fatigue limiting.    Time 8    Period Weeks    Status On-going    Target Date 09/13/21                   Plan - 08/08/21 1151     Clinical Impression Statement Patient arrived to session with husband, who continues to report that the patient is tolerating more walking at home. Notes that he has reached out to patients MD about her mood and a new prescription and referral to neuropsych has been received. Patent with L ankle pain during warm up, upon observation, L lateral malleolus slightly edematous compared to opposite side. Worked on gentle ankle mobility and educated husband on use of ice on L ankle for edema control. Also worked on STS transfers with external cue/target for improved anterior trunk lean. Patient with improved success with this cue. Also with improvement in cane sequencing with gait today and demonstrating ability to stop and re-establish proper sequencing independently today. Patient is progressing well towards goals. Patient and husband  reported understanding of all edu provided today and without complaints  at end of session.    Comorbidities breast CA with B mastectomy, HLD, HTN    PT Treatment/Interventions ADLs/Self Care Home Management;Canalith Repostioning;Cryotherapy;Electrical Stimulation;DME Instruction;Ultrasound;Moist Heat;Iontophoresis 4mg /ml Dexamethasone;Gait training;Stair training;Functional mobility training;Therapeutic activities;Therapeutic exercise;Balance training;Neuromuscular re-education;Manual techniques;Patient/family education;Passive range of motion;Dry needling;Energy conservation;Vestibular;Taping    PT Next Visit Plan progress STS transfers, gait with quad tip cane, R hip stretching and STM, balance activities; add to HEP as appropriate    Consulted and Agree with Plan of Care Patient;Family member/caregiver    Family Member Consulted husband             Patient will benefit from skilled therapeutic intervention in order to improve the following deficits and impairments:  Abnormal gait, Decreased range of motion, Difficulty walking, Increased fascial restricitons, Decreased endurance, Decreased safety awareness, Dizziness, Increased muscle spasms, Pain, Decreased activity tolerance, Decreased balance, Impaired flexibility, Improper body mechanics, Postural dysfunction, Decreased strength  Visit Diagnosis: Muscle weakness (generalized)  Unsteadiness on feet  Other abnormalities of gait and mobility     Problem List Patient Active Problem List   Diagnosis Date Noted   CVA (cerebral vascular accident) ([place]) 07/09/2021   Stroke-like symptoms 07/08/2021   Benign essential HTN    Dyslipidemia    Prediabetes 06/27/2021   Hyperlipidemia, unspecified 06/27/2021   Stroke ([place]) 06/26/2021   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 [place], [place], [address] Phone: [phone]   Fax:  [phone]  Name: TRENIYA LOBB MRN: [id number] Date of Birth: August 23, 1940

## 2021-08-10 ENCOUNTER — Encounter: Payer: Self-pay | Admitting: Cardiology

## 2021-08-10 ENCOUNTER — Inpatient Hospital Stay: Payer: Medicare Other

## 2021-08-10 ENCOUNTER — Other Ambulatory Visit: Payer: Self-pay

## 2021-08-10 ENCOUNTER — Ambulatory Visit: Payer: Medicare Other | Admitting: Cardiology

## 2021-08-10 VITALS — BP 154/66 | HR 87 | Temp 98.0°F | Resp 16 | Ht 65.0 in | Wt 147.0 lb

## 2021-08-10 DIAGNOSIS — I639 Cerebral infarction, unspecified: Secondary | ICD-10-CM | POA: Diagnosis not present

## 2021-08-10 DIAGNOSIS — E782 Mixed hyperlipidemia: Secondary | ICD-10-CM | POA: Diagnosis not present

## 2021-08-10 DIAGNOSIS — I10 Essential (primary) hypertension: Secondary | ICD-10-CM | POA: Diagnosis not present

## 2021-08-10 NOTE — Progress Notes (Signed)
Patient referred by Kelton Pillar, MD for stroke  Subjective:   Sandra Becker, female    DOB: 02/25/1941, 81 y.o.   MRN: [id number]   Chief Complaint  Patient presents with   Chest Pain   Cerebrovascular Accident   New Patient (Initial Visit)     HPI  81 y.o. Caucasian female with hypertension, hyperlipidemia, prediabetes, glaucoma, breast cancer s/p b/l mastectomy, with stroke (06/2021).  Discharge summary 07/10/2021: Recently admitted on 11/27 for acute onset slurred speech and right-sided weakness.  MRI revealed left basal ganglia infarct.  Felt to be due to small vessel disease versus cardioembolic source.  Recent echo showing EF 60 to 82%, grade 1 diastolic dysfunction, moderate mitral annular calcification, mild to moderate aortic valve sclerosis/calcification.  Recent labs showing A1c 5.7, LDL 75.  Patient was started on aspirin 81 mg daily and Brilinta 90 mg twice daily x30 days, then continue Plavix alone starting 07/25/2021.  Neurology had recommended 30-day cardiac monitor as an outpatient to rule out A. fib.  Patient was discharged to [facility] on 12/1 and returned to her independent living facility on 12/10.  She returned to the ED on 12/11 due to abrupt worsening of her aphasia and right-sided weakness.  Patient was seen by neurology and was hospitalized.   Hospital course: Right-sided weakness with aphasia in the setting of recent stroke MRI did not reveal any new infarct did show expansion of the previously seen left basal ganglia infarct.  Patient had stroke work-up recently including an echocardiogram which showed normal systolic function with grade 1 diastolic dysfunction.   Reason for presentation is not entirely clear.  Does not appear that she had a new stroke.  Presyncope could have been responsible. Orthostatic vital signs were unremarkable. LDL was 75 during previous admission, now it is 39.  HbA1c 5.7.   Urine drug screen unremarkable.   Patient was on  aspirin and Brilinta to be taken till 12/27 following which patient to start taking Plavix. Patient is on Crestor. PT OT SLP evaluation EEG did not show any epileptiform activity.   Discussed with Dr. Erlinda Hong with neurology.  Okay for discharge home today.  Seen by PT and OT and outpatient therapy is recommended.   Hyponatremia This appears to be chronic.  Stable for the most part.  Chlorthalidone was recently discontinued.     Essential hypertension Blood pressure is noted to be poorly controlled.  Continue with ARB.  Amlodipine added.  Will need outpatient follow-up for same.     History of glaucoma Continue with eyedrops.   Prediabetes HbA1c 5.7  Patient is here today with her husband. She is in a wheelchair. She requires assistance with standing up and walking. She is participating in neuro rehab. She is struggling with memory issues, sleep and emotional stress. She has not undergone any monitor testing for Afib.     Past Medical History:  Diagnosis Date   Breast cancer ([place]) 1993   bilateral mastectomy; no chemo or radiation   Glaucoma    Hyperlipidemia    Hypertension      Past Surgical History:  Procedure Laterality Date   MASTECTOMY Bilateral 1993   TONSILLECTOMY AND ADENOIDECTOMY  1947   TUBAL LIGATION  1974     Social History   Tobacco Use  Smoking Status Former   Types: Cigarettes   Quit date: 07/30/1963   Years since quitting: 58.0  Smokeless Tobacco Never    Social History   Substance and Sexual Activity  Alcohol Use Yes   Alcohol/week: 21.0 standard drinks   Types: 21 Glasses of wine per week     Family History  Problem Relation Age of Onset   Colon cancer Neg Hx      Current Outpatient Medications on File Prior to Visit  Medication Sig Dispense Refill   acetaminophen (TYLENOL) 325 MG tablet Take 2 tablets (650 mg total) by mouth every 4 (four) hours as needed for mild pain (or temp > 37.5 C (99.5 F)).     amLODipine (NORVASC) 5 MG tablet  Take 1 tablet (5 mg total) by mouth daily. 30 tablet 1   brinzolamide (AZOPT) 1 % ophthalmic suspension Place 1 drop into both eyes 2 (two) times daily.      Calcium Carbonate (CALCIUM 500 PO) Take 1 tablet by mouth every morning.     citalopram (CELEXA) 10 MG tablet Take 1 tablet (10 mg total) by mouth daily. 30 tablet 2   clopidogrel (PLAVIX) 75 MG tablet Plavix 75 mg daily beginning 07/26/2021 (Patient not taking: Reported on 07/08/2021) 30 tablet 1   Multiple Vitamin (MULTIVITAMIN WITH MINERALS) TABS tablet Take 1 tablet by mouth every morning.     Omega-3 Fatty Acids (FISH OIL) 1000 MG CPDR Take 1,000 mg by mouth every morning.     rosuvastatin (CRESTOR) 10 MG tablet Take 1 tablet (10 mg total) by mouth daily. 30 tablet 0   timolol (TIMOPTIC) 0.5 % ophthalmic solution Place 1 drop into both eyes 2 (two) times daily.     valsartan (DIOVAN) 320 MG tablet Take 1 tablet (320 mg total) by mouth daily. 30 tablet 0   vitamin C (ASCORBIC ACID) 500 MG tablet Take 500 mg by mouth every morning.     No current facility-administered medications on file prior to visit.    Cardiovascular and other pertinent studies:  EKG 08/10/2021: Sinus rhythm 81 bpm Possible old inferior infarct  Nonspecific ST-T changes  Echocardiogram 07/09/2021:  1. Left ventricular ejection fraction, by estimation, is 60 to 65%. The  left ventricle has normal function. The left ventricle has no regional  wall motion abnormalities. There is mild left ventricular hypertrophy.  Left ventricular diastolic parameters  are consistent with Grade I diastolic dysfunction (impaired relaxation).   2. Right ventricular systolic function is normal. The right ventricular  size is normal. Tricuspid regurgitation signal is inadequate for assessing  PA pressure.   3. No evidence of mitral valve regurgitation.   4. The aortic valve is calcified. Aortic valve regurgitation is mild.   5. The inferior vena cava is normal in size with  greater than 50%  respiratory variability, suggesting right atrial pressure of 3 mmHg.   MRI brain 07/08/2021: 1. Interval expansion of previously identified left basal ganglia/corona radiata infarct, now measuring up to 3.6 cm. No associated hemorrhage or significant regional mass effect. 2. No other acute intracranial abnormality. 3. Underlying age-related cerebral atrophy with moderate chronic microvascular ischemic disease.  CTA head/neck 05/03/2021: No intracranial large vessel occlusion or correctable proximal stenosis. Some distal vessel atherosclerotic irregularity.   Both carotid bifurcations are widely patent.   Aortic Atherosclerosis     Recent labs: 07/10/2021: Glucose 109, BUN/Cr 9/0.5. EGFR >60. Na/K 131/3.3. H/H 14/41. MCV 87. Platelets 370 HbA1C 5.7% Chol 120, TG 79, HDL 65, LDL 39   Review of Systems  Cardiovascular:  Negative for chest pain, dyspnea on exertion, leg swelling, palpitations and syncope.  Neurological:  Positive for loss of balance and weakness.  Psychiatric/Behavioral:  Positive for memory loss. The patient is nervous/anxious.         Vitals:   08/10/21 1311  BP: (!) 154/66  Pulse: 87  Resp: 16  Temp: 98 F (36.7 C)  SpO2: 96%     Body mass index is 24.46 kg/m. Filed Weights   08/10/21 1311  Weight: 147 lb (66.7 kg)     Objective:   Physical Exam Vitals and nursing note reviewed.  Constitutional:      General: She is not in acute distress. Neck:     Vascular: No JVD.  Cardiovascular:     Rate and Rhythm: Normal rate and regular rhythm.     Heart sounds: Normal heart sounds. No murmur heard. Pulmonary:     Effort: Pulmonary effort is normal.     Breath sounds: Normal breath sounds. No wheezing or rales.  Musculoskeletal:     Right lower leg: No edema.     Left lower leg: No edema.  Neurological:     General: No focal deficit present.     Mental Status: She is oriented to person, place, and time.        Assessment & Recommendations:    81 y.o. Caucasian female with hypertension, hyperlipidemia, prediabetes, glaucoma, breast cancer s/p b/l mastectomy, with stroke (06/2021).  Stroke: Left basal ganglia/corona radiata infarct, now measuring up to 3.6 cm.  No clear etiology identified. Cardio embolic source likely. Recommend 2 week live cardiac telemetry If no Afib identified, will consider loop recorder.  Continue plavix, statin. Hypertension, lipids well controlled Continue f/u w/neurology  Thank you for referring the patient to [place]. Please feel free to contact with any questions.   Nigel Mormon, MD Pager: [phone] Office: [phone]

## 2021-08-11 DIAGNOSIS — I639 Cerebral infarction, unspecified: Secondary | ICD-10-CM | POA: Diagnosis not present

## 2021-08-13 ENCOUNTER — Ambulatory Visit: Payer: Medicare Other

## 2021-08-13 ENCOUNTER — Ambulatory Visit: Payer: Medicare Other | Admitting: Physical Therapy

## 2021-08-13 ENCOUNTER — Other Ambulatory Visit: Payer: Self-pay

## 2021-08-13 ENCOUNTER — Encounter: Payer: Self-pay | Admitting: Occupational Therapy

## 2021-08-13 ENCOUNTER — Encounter: Payer: Self-pay | Admitting: Physical Therapy

## 2021-08-13 ENCOUNTER — Ambulatory Visit: Payer: Medicare Other | Admitting: Occupational Therapy

## 2021-08-13 DIAGNOSIS — I69351 Hemiplegia and hemiparesis following cerebral infarction affecting right dominant side: Secondary | ICD-10-CM

## 2021-08-13 DIAGNOSIS — R4701 Aphasia: Secondary | ICD-10-CM | POA: Diagnosis not present

## 2021-08-13 DIAGNOSIS — R41841 Cognitive communication deficit: Secondary | ICD-10-CM

## 2021-08-13 DIAGNOSIS — M6281 Muscle weakness (generalized): Secondary | ICD-10-CM

## 2021-08-13 DIAGNOSIS — R2689 Other abnormalities of gait and mobility: Secondary | ICD-10-CM

## 2021-08-13 DIAGNOSIS — M25552 Pain in left hip: Secondary | ICD-10-CM

## 2021-08-13 DIAGNOSIS — R2681 Unsteadiness on feet: Secondary | ICD-10-CM

## 2021-08-13 DIAGNOSIS — I69318 Other symptoms and signs involving cognitive functions following cerebral infarction: Secondary | ICD-10-CM

## 2021-08-13 NOTE — Therapy (Signed)
[facility] [address] [place], [place], [address] Phone: [phone]   Fax:  [phone]  Occupational Therapy Treatment  Patient Details  Name: Sandra Becker MRN: [id number] Date of Birth: [birth year] Referring Provider (OT): Bonnielee Haff, MD   Encounter Date: 08/13/2021   OT End of Session - 08/13/21 1412     Visit Number 6    Number of Visits 17    Date for OT Re-Evaluation 09/13/21    Authorization Type Medicare A and B    Authorization - Visit Number 6    Authorization - Number of Visits 10    Progress Note Due on Visit 10    OT Start Time 1316    OT Stop Time 1400    OT Time Calculation (min) 44 min    Activity Tolerance Patient tolerated treatment well    Behavior During Therapy Flat affect             Past Medical History:  Diagnosis Date   Breast cancer ([place]) 1993   bilateral mastectomy; no chemo or radiation   Glaucoma    Hyperlipidemia    Hypertension     Past Surgical History:  Procedure Laterality Date   MASTECTOMY Bilateral [place]    There were no vitals filed for this visit.   Subjective Assessment - 08/13/21 1331     Subjective  Pt husband reports pt fatigued from 3 therapy visits and still not sleeping well.    Patient is accompanied by: Family member   husband   Pertinent History breast cancer s/p bilateral mastectomy, glaucoma, HTN, and TIA    Patient Stated Goals "to improve in all areas"    Currently in Pain? Yes    Pain Score 3     Pain Location Hip    Pain Orientation Left    Pain Descriptors / Indicators Aching    Pain Type Chronic pain    Pain Onset More than a month ago    Pain Frequency Constant              Dynamic standing balance with alternating toe taps to colored targets.  Pt required hand held assist during weight shifting and toe taps due to decreased balance and balance reactions without  UE support.  Pt demonstrating fatigue throughout, tolerating 1-2 mins before requiring seated rest breaks.    Dynamic standing balance/endurance with overhead presses and PNF pattern diagonals with small ball with focus on increasing endurance.  Pt completed 1 set of 10 each before requesting seated rest break.  GMC/dynamic balance with resistive clothespins.  Engaged in trunk rotation and reaching with RUE into various planes to obtain and place resistive clothespins (4#, 6#, and 8#) onto vertical dowel.  Pt with increased difficulty with 8#, especially in high reach.  Blocked practice sit > stand with multiple attempts, pt able to complete 50% while holding ball in hands to facilitate increased weight shift and decrease reliance on UE support.  Mod cues for sequencing and problem solving with weight shift.  ADL/toileting:  Pt completed toilet transfers with distant supervision.  Pt able to manage clothing up and down with single UE support on RW or grab bar.  Pt attempting to ambulate 2-3 steps without RW while in small bathroom.  Therapist providing cues for safety and to maintain positioning within RW when ambulating and when reaching outside BOS.  OT Short Term Goals - 07/27/21 0856       OT SHORT TERM GOAL #1   Title Pt will demonstrate increased fine motor coordination for utensil use with reports of decreased spillage    Time 4    Period Weeks    Status On-going    Target Date 08/16/21      OT SHORT TERM GOAL #2   Title Pt will complete sit > stand over 3 different sessions with supervision to increase independence with toilet transfers.    Time 4    Period Weeks    Status On-going      OT SHORT TERM GOAL #3   Title Pt will be indepedent in use of HEP for [facility]    Time 4    Period Weeks    Status On-going      OT SHORT TERM GOAL #4   Title Pt will demonstrate improved RUE functional use for ADLs as evidenced by increasing box/ blocks  score by 5 blocks    Baseline R: 20 and L: 30    Time 4    Period Weeks    Status On-going               OT Long Term Goals - 07/27/21 0857       OT LONG TERM GOAL #1   Title Pt will demonstrate improved fine motor coordination for ADLs as evidenced by decreasing 9 hole peg test score for RUE by 15 secs    Baseline R: 58.79 and L: 34.81    Time 8    Period Weeks    Status On-going    Target Date 09/13/21      OT LONG TERM GOAL #2   Title Pt will demonstrate improved [place] as needed for handwriting by writing grocery list with 100% legibility utilizing adaptive techniques as needed.    Time 8    Period Weeks    Status On-going      OT LONG TERM GOAL #3   Title Pt will demonstate understanding of energy conservation strategies to increase safe participation in ADLs and IADLs.    Time 8    Period Weeks    Status On-going      OT LONG TERM GOAL #4   Title Pt will report increased ability to complete toilet transfers without physical assistance from caregiver.    Time 8    Period Weeks    Status On-going                   Plan - 08/13/21 1412     Clinical Impression Statement Pt continues to have flat affect, possible depression impacting active participation in therapy sessions. Pt is limited in endurance and activity tolerance due to reports of poor sleep at nights and fatigue today with 3 sessions in a row. Pt demonstrating improved ability to complete sit > stand with decreased use of UE, able to complete 50% of attempts without use of hands.    OT Occupational Profile and History Detailed Assessment- Review of Records and additional review of physical, cognitive, psychosocial history related to current functional performance    Occupational performance deficits (Please refer to evaluation for details): ADL's;IADL's;Leisure    Body Structure / Function / Physical Skills ADL;Balance;Coordination;Decreased knowledge of precautions;Continence;Decreased knowledge of  use of DME;Dexterity;Endurance;Flexibility;FMC;GMC;IADL;Mobility;Pain;ROM;Strength;UE functional use;Vision    Cognitive Skills Attention;Memory;Problem Solve;Safety Awareness;Sequencing    Rehab Potential Good    Clinical Decision Making Limited treatment options, no task modification  necessary    Comorbidities Affecting Occupational Performance: May have comorbidities impacting occupational performance    Modification or Assistance to Complete Evaluation  No modification of tasks or assist necessary to complete eval    OT Frequency 2x / week    OT Duration 8 weeks    OT Treatment/Interventions Self-care/ADL training;Cryotherapy;Electrical Stimulation;Moist Heat;Ultrasound;Iontophoresis;Therapeutic exercise;Neuromuscular education;Energy conservation;DME and/or AE instruction;Therapist, nutritional;Therapeutic activities;Cognitive remediation/compensation;Visual/perceptual remediation/compensation;Patient/family education;Balance training;Psychosocial skills training;Coping strategies training    Plan R attention, RUE GMC (ataxia) - self-feeding, standing balance with weight shifting, sit > stand    Consulted and Agree with Plan of Care Patient    Family Member Consulted husband             Patient will benefit from skilled therapeutic intervention in order to improve the following deficits and impairments:   Body Structure / Function / Physical Skills: ADL, Balance, Coordination, Decreased knowledge of precautions, Continence, Decreased knowledge of use of DME, Dexterity, Endurance, Flexibility, FMC, GMC, IADL, Mobility, Pain, ROM, Strength, UE functional use, Vision Cognitive Skills: Attention, Memory, Problem Solve, Safety Awareness, Sequencing     Visit Diagnosis: Muscle weakness (generalized)  Unsteadiness on feet  Other abnormalities of gait and mobility  Hemiplegia and hemiparesis following cerebral infarction affecting right dominant side (HCC)  Other symptoms and  signs involving cognitive functions following cerebral infarction    Problem List Patient Active Problem List   Diagnosis Date Noted   CVA (cerebral vascular accident) ([place]) 07/09/2021   Stroke-like symptoms 07/08/2021   Benign essential HTN    Dyslipidemia    Prediabetes 06/27/2021   Mixed hyperlipidemia 06/27/2021   Stroke ([place]) 06/26/2021   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, [place], [place], [address] Phone: [phone]   Fax:  [phone]  Name: Sandra Becker MRN: [id number] Date of Birth: [birth year]

## 2021-08-13 NOTE — Therapy (Signed)
[facility] [address], [place], [place], [address] Phone: [phone]   Fax:  [phone]  Physical Therapy Treatment  Patient Details  Name: Sandra Becker MRN: [id number] Date of Birth: August 20, 1940 Referring Provider (PT): Bonnielee Haff, MD   Encounter Date: 08/13/2021   PT End of Session - 08/13/21 1234     Visit Number 7    Number of Visits 17    Date for PT Re-Evaluation 09/13/21    Authorization Type Medicare/AARP    PT Start Time 1148    PT Stop Time 1231    PT Time Calculation (min) 43 min    Equipment Utilized During Treatment Gait belt    Activity Tolerance Patient tolerated treatment well    Behavior During Therapy Flat affect             Past Medical History:  Diagnosis Date   Breast cancer ([place]) 1993   bilateral mastectomy; no chemo or radiation   Glaucoma    Hyperlipidemia    Hypertension     Past Surgical History:  Procedure Laterality Date   MASTECTOMY Bilateral [place]    There were no vitals filed for this visit.   Subjective Assessment - 08/13/21 1151     Subjective Doing fine. Husband reports that she did 4-5 trials of walking for longer durations. Ankle swelling went down almost immediately after using ice. Have been using heat on the thigh.    Patient is accompained by: Family member    Pertinent History breast CA with B mastectomy, HLD, HTN    Diagnostic tests 06/25/21 brain MRI showed acute infarct left basal ganglia; 07/08/21 brain MRI showed interval expansions of L basal ganglia infarct    Patient Stated Goals "work on everything" including stability and walking tolerance    Currently in Pain? Yes    Pain Score 3     Pain Location Hip    Pain Orientation Left    Pain Descriptors / Indicators Aching    Pain Type Chronic pain                               [facility] Adult PT Treatment/Exercise -  08/13/21 0001       Ambulation/Gait   Ambulation Distance (Feet) 186 Feet    Assistive device Straight cane   quad tip   Gait Pattern Step-to pattern;Step-through pattern;Decreased step length - right;Decreased step length - left;Lateral trunk lean to right;Trunk flexed    Ambulation Surface Level    Stairs Yes    Stairs Assistance 4: Min guard    Stair Management Technique One rail Right;Step to pattern;Forwards;With cane    Number of Stairs --   clinic stairs x 2   Gait Comments stair navigation with heavy cueing for L LE dominant step to pattern; gait training with CGA and intermittent cues for cane sequencing      Neuro Re-ed    Neuro Re-ed Details  alt toe tap on 6" step 10x, on cone 2x10 with CGA; sidestepping and backwards walking with CGA 2x33ft   cues for foot placement to avoid knocking down cone     Knee/Hip Exercises: Seated   Sit to Sand 2 sets;5 reps;without UE support   leaning nose over target on floor; reviewed steps to set up transfer; min A required  PT Short Term Goals - 08/06/21 1242       PT SHORT TERM GOAL #1   Title Patient to perform HEP with family supervision for improved strength, balance, transfers, posture, and mobility.    Time 3    Period Weeks    Status Achieved    Target Date 08/09/21               PT Long Term Goals - 07/25/21 1013       PT LONG TERM GOAL #1   Title Patient to perform advanced HEP with family supervision for improved strength, balance, transfers, posture, and mobility.    Time 8    Period Weeks    Status On-going    Target Date 09/13/21      PT LONG TERM GOAL #2   Title Patient to complete TUG in <30 sec with LRAD in order to decrease risk of falls.    Time 8    Period Weeks    Status On-going    Target Date 09/13/21      PT LONG TERM GOAL #3   Title Patient to demonstrate 5xSTS test in <20 sec in order to decrease risk of falls.    Time 8    Period Weeks    Status On-going     Target Date 09/13/21      PT LONG TERM GOAL #4   Title Patient to demonstrate B LE strength >/=4/5.    Time 8    Period Weeks    Status On-going    Target Date 08/30/21      PT LONG TERM GOAL #5   Title Patient/family to report tolerance for ambulating to dining fall with LRAD with good safety and no fatigue limiting.    Time 8    Period Weeks    Status On-going    Target Date 09/13/21                   Plan - 08/13/21 1234     Clinical Impression Statement Patient arrived to session with husband, ambulating with RW with good stability. Husband reports continued HEP compliance and ambulation for fitness. Patient denies ankle pain today.  Reviewed STS transfers with visual target as cue for anterior trunk lean. Slightly more difficulty demonstrated with STS today. Performed stair navigation with heavy cueing for L LE-dominant step-to pattern. Performed gait training with CGA and intermittent cues for cane sequencing; patient with good stability. Slightly increased challenge with dynamic standing activities; patient no longer required HHA with toe taps however with posterior LOB requiring min A with backwards walking. Patient tolerated session well. Continues to require skilled PT services to address balance and gait.    Comorbidities breast CA with B mastectomy, HLD, HTN    PT Treatment/Interventions ADLs/Self Care Home Management;Canalith Repostioning;Cryotherapy;Electrical Stimulation;DME Instruction;Ultrasound;Moist Heat;Iontophoresis 4mg /ml Dexamethasone;Gait training;Stair training;Functional mobility training;Therapeutic activities;Therapeutic exercise;Balance training;Neuromuscular re-education;Manual techniques;Patient/family education;Passive range of motion;Dry needling;Energy conservation;Vestibular;Taping    PT Next Visit Plan progress STS transfers, gait with quad tip cane, R hip stretching and STM, balance activities; add to HEP as appropriate    Consulted and Agree  with Plan of Care Patient;Family member/caregiver    Family Member Consulted husband             Patient will benefit from skilled therapeutic intervention in order to improve the following deficits and impairments:  Abnormal gait, Decreased range of motion, Difficulty walking, Increased fascial restricitons, Decreased endurance, Decreased safety awareness, Dizziness, Increased muscle spasms,  Pain, Decreased activity tolerance, Decreased balance, Impaired flexibility, Improper body mechanics, Postural dysfunction, Decreased strength  Visit Diagnosis: Muscle weakness (generalized)  Unsteadiness on feet  Other abnormalities of gait and mobility  Pain in left hip     Problem List Patient Active Problem List   Diagnosis Date Noted   CVA (cerebral vascular accident) ([place]) 07/09/2021   Stroke-like symptoms 07/08/2021   Benign essential HTN    Dyslipidemia    Prediabetes 06/27/2021   Mixed hyperlipidemia 06/27/2021   Stroke ([place]) 06/26/2021   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, [place], [place], [address] Phone: [phone]   Fax:  [phone]  Name: Sandra Becker MRN: [id number] Date of Birth: 1941/01/09

## 2021-08-14 NOTE — Therapy (Signed)
[facility] [address] [place], [place], [address] Phone: [phone]   Fax:  [phone]  Speech Language Pathology Treatment  Patient Details  Name: Sandra Becker MRN: [id number] Date of Birth: July 09, 1941 Referring Provider (SLP): Leonides Sake (ref), Kelton Pillar, MD (doc)   Encounter Date: 08/13/2021   End of Session - 08/14/21 0054     Visit Number 4    Number of Visits 25    Date for SLP Re-Evaluation 10/17/21    SLP Start Time 7253    SLP Stop Time  6644    SLP Time Calculation (min) 40 min    Activity Tolerance Patient tolerated treatment well             Past Medical History:  Diagnosis Date   Breast cancer ([place]) 1993   bilateral mastectomy; no chemo or radiation   Glaucoma    Hyperlipidemia    Hypertension     Past Surgical History:  Procedure Laterality Date   MASTECTOMY Bilateral [place]    There were no vitals filed for this visit.   Subjective Assessment - 08/14/21 0051     Subjective Sandra Becker "We worked on teh [employer] together today, adn did a simple crossword puzzle."    Patient is accompained by: Family member   husband Sandra Becker   Currently in Pain? Yes    Pain Score 3     Pain Location Hip    Pain Orientation Left                   ADULT SLP TREATMENT - 08/14/21 0001       General Information   Behavior/Cognition Cooperative;Alert;Agitated      Treatment Provided   Treatment provided Cognitive-Linquistic      Cognitive-Linquistic Treatment   Treatment focused on Aphasia    Skilled Treatment Husband brought pt's iPAd with pictures and pt generated phrases 90% of the time re: pictures, and was able to answer questions by SLP functionally  95% of the time. Pt with much long sighing if she could not immediately think of the word to use. Three times she looked at [facility] to assist her but just said, "I don't  know." SLP needed to encourage pt to cont with suggesting she use a compensation which pt did successfully 40%. Overall, pt's speech was functional today.      Assessment / Recommendations / Plan   Plan Continue with current plan of care      Progression Toward Goals   Progression toward goals Progressing toward goals                SLP Short Term Goals - 08/14/21 0055       SLP SHORT TERM GOAL #1   Title pt will verbalize 10 personally relevant words (family names, etc) 80% success x3 sessions    Baseline 08-13-21    Time 4    Period Weeks    Status On-going    Target Date 08/17/21      SLP SHORT TERM GOAL #2   Title pt will request repeat if necessary in 90% of opportunities in 3 sessions    Time 4    Period Weeks    Status On-going    Target Date 08/17/21      SLP SHORT TERM GOAL #3   Title pt will undergo standardized aphasia assessment    Status  Achieved    Target Date 08/03/21      SLP SHORT TERM GOAL #4   Title [name] will complete a language based QOL measure    Time 2    Period Weeks    Status On-going    Target Date 08/03/21              SLP Long Term Goals - 08/14/21 0056       SLP LONG TERM GOAL #1   Title in the last 1-2 weeks of ST pt will score higher/better on a language based QOL measure than the first 2 weeks of ST    Time 12    Period Weeks    Status On-going    Target Date 10/17/21      SLP LONG TERM GOAL #2   Title pt will verbalize subject verb object when describing pictures 90% of the time in 3 sessions    Time 8    Period Weeks    Status On-going    Target Date 10/17/21      SLP LONG TERM GOAL #3   Title pt will demo understanding of simple conversation of 10 minutes with modified independence    Time 8    Period Weeks    Status On-going      SLP LONG TERM GOAL #4   Title [name] will use verbalization for wants/needs in 85% of opportunities between 3 sessions, as reported by pt/husband    Time 8    Period Weeks     Status New    Target Date 10/17/21      SLP LONG TERM GOAL #5   Title pt will functionally participate in 10 minutes simple conversation x3 sessions    Time 12    Period Weeks    Status On-going    Target Date 10/17/21              Plan - 08/14/21 0055     Clinical Impression Statement Pt presents today with at moderate aphasia (receptive better than expressive) as tested by Quick Aphasia Battery (QAB).  Today pt described personal pictures functionally with some coaxing from SLP to participate. SLP cont to believe pt will benefit from skilled ST targeting receptive and expressive language to be able to communicate effectively at home and in the community.    Speech Therapy Frequency 2x / week    Duration 12 weeks    Treatment/Interventions Environmental controls;Functional tasks;Compensatory techniques;Multimodal communcation approach;SLP instruction and feedback;Cueing hierarchy;Language facilitation;Cognitive reorganization;Patient/family education;Internal/external aids    Potential to Achieve Goals Good    Consulted and Agree with Plan of Care Patient;Family member/caregiver    Family Member Consulted husband             Patient will benefit from skilled therapeutic intervention in order to improve the following deficits and impairments:   Aphasia  Cognitive communication deficit    Problem List Patient Active Problem List   Diagnosis Date Noted   CVA (cerebral vascular accident) ([place]) 07/09/2021   Stroke-like symptoms 07/08/2021   Benign essential HTN    Dyslipidemia    Prediabetes 06/27/2021   Mixed hyperlipidemia 06/27/2021   Stroke ([place]) 06/26/2021   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, [place], [place], [address] Phone:  [phone]  Fax:  [phone]   Name: Sandra Becker MRN: [id number] Date of Birth: April 06, 1941

## 2021-08-15 ENCOUNTER — Encounter: Payer: Self-pay | Admitting: Occupational Therapy

## 2021-08-15 ENCOUNTER — Encounter: Payer: Self-pay | Admitting: Physical Therapy

## 2021-08-15 ENCOUNTER — Ambulatory Visit: Payer: Medicare Other

## 2021-08-15 ENCOUNTER — Other Ambulatory Visit: Payer: Self-pay

## 2021-08-15 ENCOUNTER — Ambulatory Visit: Payer: Medicare Other | Admitting: Physical Therapy

## 2021-08-15 ENCOUNTER — Ambulatory Visit: Payer: Medicare Other | Admitting: Occupational Therapy

## 2021-08-15 DIAGNOSIS — R4701 Aphasia: Secondary | ICD-10-CM

## 2021-08-15 DIAGNOSIS — R2681 Unsteadiness on feet: Secondary | ICD-10-CM

## 2021-08-15 DIAGNOSIS — I69318 Other symptoms and signs involving cognitive functions following cerebral infarction: Secondary | ICD-10-CM

## 2021-08-15 DIAGNOSIS — R2689 Other abnormalities of gait and mobility: Secondary | ICD-10-CM

## 2021-08-15 DIAGNOSIS — M6281 Muscle weakness (generalized): Secondary | ICD-10-CM

## 2021-08-15 DIAGNOSIS — I69351 Hemiplegia and hemiparesis following cerebral infarction affecting right dominant side: Secondary | ICD-10-CM

## 2021-08-15 DIAGNOSIS — R41841 Cognitive communication deficit: Secondary | ICD-10-CM

## 2021-08-15 NOTE — Therapy (Signed)
[facility] [address], [place], [place], [address] Phone: [phone]   Fax:  [phone]  Speech Language Pathology Treatment  Patient Details  Name: Sandra Becker MRN: [id number] Date of Birth: May 04, 1941 Referring Provider (SLP): Leonides Sake (ref), Kelton Pillar, MD (doc)   Encounter Date: 08/15/2021   End of Session - 08/15/21 1618     Visit Number 5    Number of Visits 25    Date for SLP Re-Evaluation 10/17/21    SLP Start Time 1233    SLP Stop Time  7948    SLP Time Calculation (min) 42 min    Activity Tolerance Patient tolerated treatment well             Past Medical History:  Diagnosis Date   Breast cancer ([place]) 1993   bilateral mastectomy; no chemo or radiation   Glaucoma    Hyperlipidemia    Hypertension     Past Surgical History:  Procedure Laterality Date   MASTECTOMY Bilateral [place]    There were no vitals filed for this visit.   Subjective Assessment - 08/15/21 1319     Subjective "We did the wordle again and also looked at more pictures."    Patient is accompained by: Family member   Sandra Becker   Currently in Pain? Yes    Pain Score 3     Pain Location Hip    Pain Orientation Right                   ADULT SLP TREATMENT - 08/15/21 1320       General Information   Behavior/Cognition Cooperative;Alert;Pleasant mood   rare agitation with word finding     Treatment Provided   Treatment provided Cognitive-Linquistic      Cognitive-Linquistic Treatment   Treatment focused on Aphasia    Skilled Treatment SLP introduced and educated Sandra Becker about semantic feature analysis (SFA). SLP used common items and pt req'd usual min-mod A for production of responses for SFA. SLP encouraged husband to work on this framework with pt at home and use same cues SLP provided for pt.      Assessment / Recommendations / Plan    Plan Continue with current plan of care      Progression Toward Goals   Progression toward goals Progressing toward goals              SLP Education - 08/15/21 1617     Education Details cueing methods for SFA    Person(s) Educated Patient;Spouse    Methods Explanation;Demonstration    Comprehension Verbalized understanding              SLP Short Term Goals - 08/15/21 1618       SLP SHORT TERM GOAL #1   Title pt will verbalize 10 personally relevant words (family names, etc) 80% success x3 sessions    Baseline 08-13-21, 08-15-21    Status Partially Met    Target Date 08/17/21      SLP SHORT TERM GOAL #2   Title pt will request repeat if necessary in 90% of opportunities in 3 sessions    Status Deferred    Target Date 08/17/21      SLP SHORT TERM GOAL #3   Title pt will undergo standardized aphasia assessment    Status Achieved    Target Date 08/03/21  SLP SHORT TERM GOAL #4   Title Sandra Becker will complete a language based QOL measure    Status Deferred   due to pt's psychological status about speaking   Target Date 08/03/21              SLP Long Term Goals - 08/15/21 1619       SLP LONG TERM GOAL #1   Title in the last 1-2 weeks of ST pt will score higher/better on a language based QOL measure than the first 2 weeks of ST    Time 12    Period Weeks    Status On-going    Target Date 10/17/21      SLP LONG TERM GOAL #2   Title pt will verbalize subject verb object when describing pictures 90% of the time in 3 sessions    Time 8    Period Weeks    Status On-going    Target Date 10/17/21      SLP LONG TERM GOAL #3   Title pt will demo understanding of simple conversation of 10 minutes with modified independence    Time 8    Period Weeks    Status On-going    Target Date 09/14/21      SLP LONG TERM GOAL #4   Title Sandra Becker will use verbalization for wants/needs in 85% of opportunities between 3 sessions, as reported by pt/husband    Time 8     Period Weeks    Status New    Target Date 10/17/21      SLP LONG TERM GOAL #5   Title pt will functionally participate in 10 minutes simple conversation x3 sessions    Time 12    Period Weeks    Status On-going    Target Date 10/17/21              Plan - 08/15/21 1618     Clinical Impression Statement Pt presents today with at moderate aphasia (receptive better than expressive) as tested by Quick Aphasia Battery (QAB).  Today pt described personal pictures functionally with some coaxing from SLP to participate. SLP cont to believe pt will benefit from skilled ST targeting receptive and expressive language to be able to communicate effectively at home and in the community.    Speech Therapy Frequency 2x / week    Duration 12 weeks    Treatment/Interventions Environmental controls;Functional tasks;Compensatory techniques;Multimodal communcation approach;SLP instruction and feedback;Cueing hierarchy;Language facilitation;Cognitive reorganization;Patient/family education;Internal/external aids    Potential to Achieve Goals Good    Consulted and Agree with Plan of Care Patient;Family member/caregiver    Family Member Consulted husband             Patient will benefit from skilled therapeutic intervention in order to improve the following deficits and impairments:   Aphasia  Cognitive communication deficit    Problem List Patient Active Problem List   Diagnosis Date Noted   CVA (cerebral vascular accident) ([place]) 07/09/2021   Stroke-like symptoms 07/08/2021   Benign essential HTN    Dyslipidemia    Prediabetes 06/27/2021   Mixed hyperlipidemia 06/27/2021   Stroke ([place]) 06/26/2021   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, [place], [place],  [address] Phone: [phone]   Fax:  [phone]   Name: Sandra Becker  Sandra Becker MRN: [id number] Date of Birth: 09-Feb-1941

## 2021-08-15 NOTE — Therapy (Signed)
[facility] [address] [place], [place], [address] Phone: [phone]   Fax:  [phone]  Occupational Therapy Treatment  Patient Details  Name: Sandra Becker MRN: [id number] Date of Birth: Dec 15, 1940 Referring Provider (OT): Bonnielee Haff, MD   Encounter Date: 08/15/2021   OT End of Session - 08/15/21 1224     Visit Number 7    Number of Visits 17    Date for OT Re-Evaluation 09/13/21    Authorization Type Medicare A and B    Authorization - Visit Number 7    Authorization - Number of Visits 10    Progress Note Due on Visit 10    OT Start Time 1152    OT Stop Time 1232    OT Time Calculation (min) 40 min    Equipment Utilized During Treatment gait belt    Activity Tolerance Patient tolerated treatment well    Behavior During Therapy Flat affect             Past Medical History:  Diagnosis Date   Breast cancer ([place]) 1993   bilateral mastectomy; no chemo or radiation   Glaucoma    Hyperlipidemia    Hypertension     Past Surgical History:  Procedure Laterality Date   MASTECTOMY Bilateral [place]    There were no vitals filed for this visit.   Subjective Assessment - 08/15/21 1155     Subjective  Pt husband reports pt is doing well with theraputty and [place] HEP.  He reports she is improving with self-feeding and ability to cut food.    Patient is accompanied by: Family member   husband   Pertinent History breast cancer s/p bilateral mastectomy, glaucoma, HTN, and TIA    Patient Stated Goals "to improve in all areas"    Currently in Pain? Yes    Pain Score 3     Pain Location Hip    Pain Orientation Left;Right    Pain Descriptors / Indicators Aching    Pain Type Chronic pain    Pain Onset More than a month ago               ADLs/bathroom transfers:  Pt completed ambulatory bathroom transfer with RW with distant supervision.  Once  pt seated on toilet, therapist stepped out and pt able to complete all hygiene and clothing management at Mod I level for remainder of task.  Engaged in discussion about bathing.  Pt reports still bathing at sink and that she is not yet ready for showering.  Pt reports understanding of DME and ability to focus on bathroom transfers as part of OT session.    Dynamic standing balance: Engaged in balance activity with bouncing ball in standing while challenging balance reactions and endurance.  Increased challenge to bouncing to R and L of body to increase facilitation of weight shifting and balance reactions as needed for ADLs.  Further upgraded task to incorporating side stepping and then bouncing ball.  Pt required CGA throughout dynamic balance activity, noting much improved balance and weight shifting this session.  Pt completed all sit > stands with supervision this session.  Completed FMC/GMC assessment.  Pt improved Box and blocks score with RUE form 20 on eval to 29 this date.  Pt also demonstrating improved [place] with 9 hole peg test with improved score of 50.9 seconds compared to 58.79 on eval.  OT Short Term Goals - 08/15/21 1203       OT SHORT TERM GOAL #1   Title Pt will demonstrate increased fine motor coordination for utensil use with reports of decreased spillage    Time 4    Period Weeks    Status Achieved    Target Date 08/16/21      OT SHORT TERM GOAL #2   Title Pt will complete sit > stand over 3 different sessions with supervision to increase independence with toilet transfers.    Time 4    Period Weeks    Status Achieved      OT SHORT TERM GOAL #3   Title Pt will be independent in use of HEP for [facility]    Time 4    Period Weeks    Status Achieved      OT SHORT TERM GOAL #4   Title Pt will demonstrate improved RUE functional use for ADLs as evidenced by increasing box/ blocks score by 5 blocks    Baseline R: 20 and L: 30   R: 29 on  08/15/2021   Time 4    Period Weeks    Status Achieved               OT Long Term Goals - 08/15/21 1201       OT LONG TERM GOAL #1   Title Pt will demonstrate improved fine motor coordination for ADLs as evidenced by decreasing 9 hole peg test score for RUE by 15 secs    Baseline R: 58.79 and L: 34.81  -- 49.9 on R on 08/15/21    Time 8    Period Weeks    Status On-going    Target Date 09/13/21      OT LONG TERM GOAL #2   Title Pt will demonstrate improved [place] as needed for handwriting by writing grocery list with 100% legibility utilizing adaptive techniques as needed.    Time 8    Period Weeks    Status On-going      OT LONG TERM GOAL #3   Title Pt will demonstate understanding of energy conservation strategies to increase safe participation in ADLs and IADLs.    Time 8    Period Weeks    Status On-going      OT LONG TERM GOAL #4   Title Pt will report increased ability to complete toilet transfers without physical assistance from caregiver.    Time 8    Period Weeks    Status On-going                   Plan - 08/15/21 1225     Clinical Impression Statement Pt continues to have flat affect, possible depression per spouse, however with improved engagement throughout session this date.  Pt tolerated standing and weight shifting activity with CGA for standing balance.  Pt demonstrating improved safety awareness and ability to complete self-care tasks at supervision - Mod I level with use of RW during mobility.    OT Occupational Profile and History Detailed Assessment- Review of Records and additional review of physical, cognitive, psychosocial history related to current functional performance    Occupational performance deficits (Please refer to evaluation for details): ADL's;IADL's;Leisure    Body Structure / Function / Physical Skills ADL;Balance;Coordination;Decreased knowledge of precautions;Continence;Decreased knowledge of use of  DME;Dexterity;Endurance;Flexibility;FMC;GMC;IADL;Mobility;Pain;ROM;Strength;UE functional use;Vision    Cognitive Skills Attention;Memory;Problem Solve;Safety Awareness;Sequencing    Rehab Potential Good    Clinical Decision Making Limited treatment  options, no task modification necessary    Comorbidities Affecting Occupational Performance: May have comorbidities impacting occupational performance    Modification or Assistance to Complete Evaluation  No modification of tasks or assist necessary to complete eval    OT Frequency 2x / week    OT Duration 8 weeks    OT Treatment/Interventions Self-care/ADL training;Cryotherapy;Electrical Stimulation;Moist Heat;Ultrasound;Iontophoresis;Therapeutic exercise;Neuromuscular education;Energy conservation;DME and/or AE instruction;Therapist, nutritional;Therapeutic activities;Cognitive remediation/compensation;Visual/perceptual remediation/compensation;Patient/family education;Balance training;Psychosocial skills training;Coping strategies training    Plan R attention, RUE GMC (ataxia), standing balance with weight shifting, sit > stand    Consulted and Agree with Plan of Care Patient    Family Member Consulted husband             Patient will benefit from skilled therapeutic intervention in order to improve the following deficits and impairments:   Body Structure / Function / Physical Skills: ADL, Balance, Coordination, Decreased knowledge of precautions, Continence, Decreased knowledge of use of DME, Dexterity, Endurance, Flexibility, FMC, GMC, IADL, Mobility, Pain, ROM, Strength, UE functional use, Vision Cognitive Skills: Attention, Memory, Problem Solve, Safety Awareness, Sequencing     Visit Diagnosis: Muscle weakness (generalized)  Unsteadiness on feet  Other abnormalities of gait and mobility  Hemiplegia and hemiparesis following cerebral infarction affecting right dominant side (HCC)  Other symptoms and signs involving cognitive  functions following cerebral infarction    Problem List Patient Active Problem List   Diagnosis Date Noted   CVA (cerebral vascular accident) ([place]) 07/09/2021   Stroke-like symptoms 07/08/2021   Benign essential HTN    Dyslipidemia    Prediabetes 06/27/2021   Mixed hyperlipidemia 06/27/2021   Stroke ([place]) 06/26/2021   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 [place], [place], [address] Phone: [phone]   Fax:  [phone]  Name: Sandra Becker MRN: [id number] Date of Birth: [birth year]

## 2021-08-15 NOTE — Therapy (Addendum)
[facility] [address], [place], [place], [address] Phone: [phone]   Fax:  [phone]  Physical Therapy Treatment  Patient Details  Name: Sandra Becker MRN: [id number] Date of Birth: [birth year] Referring Provider (PT): Bonnielee Haff, MD  Progress Note Reporting Period 07/19/21 to 08/15/21  See note below for Objective Data and Assessment of Progress/Goals.    Encounter Date: 08/15/2021   PT End of Session - 08/15/21 1638     Visit Number 8    Number of Visits 17    Date for PT Re-Evaluation 09/13/21    Authorization Type Medicare/AARP    PT Start Time 1319    PT Stop Time 1401    PT Time Calculation (min) 42 min    Equipment Utilized During Treatment Gait belt    Activity Tolerance Patient tolerated treatment well;Patient limited by pain    Behavior During Therapy Flat affect             Past Medical History:  Diagnosis Date   Breast cancer ([place]) 1993   bilateral mastectomy; no chemo or radiation   Glaucoma    Hyperlipidemia    Hypertension     Past Surgical History:  Procedure Laterality Date   MASTECTOMY Bilateral [place]    There were no vitals filed for this visit.   Subjective Assessment - 08/15/21 1323     Subjective Husband reports that the patient did a "long long walk" yesterday with her walker- patient reports good stability but fatigued after this.    Patient is accompained by: Family member    Pertinent History breast CA with B mastectomy, HLD, HTN    Diagnostic tests 06/25/21 brain MRI showed acute infarct left basal ganglia; 07/08/21 brain MRI showed interval expansions of L basal ganglia infarct    Patient Stated Goals "work on everything" including stability and walking tolerance    Currently in Pain? Yes    Pain Score 3     Pain Location Hip    Pain Orientation Right    Pain Descriptors / Indicators Aching    Pain  Type Chronic pain                               [facility] Adult PT Treatment/Exercise - 08/15/21 0001       Neuro Re-ed    Neuro Re-ed Details  sitting anterior trunk lean to reach mat in front 5-10x before STS;      Knee/Hip Exercises: Aerobic   Nustep L5 x 3 min, L3 x 3 min (UEs/LEs)      Knee/Hip Exercises: Standing   Forward Step Up Right;Left;1 set;10 reps;Hand Hold: 1;Step Height: 4";Step Height: 6"    Forward Step Up Limitations 6" R LE, discontinued on L LE d/t c/o R foot pain   CGA-min A     Knee/Hip Exercises: Seated   Clamshell with TheraBand Red;Green   10x each   Marching Strengthening;Both;1 set;10 reps;Limitations    Marching Limitations red loop around toes    Sit to Sand 5 reps;without UE support;3 sets   last set of 3 reps without airex under bottom; reaching arms to mat in front; continued cueing for proper transfer set up; educated husband on using this technique at home  PT Education - 08/15/21 1635     Education Details discussion with husband about patient's fitness activities at home; educated husband on modifying STS for improved success; advised to ice R ankle as needed for swelling    Person(s) Educated Patient;Spouse    Methods Explanation;Demonstration;Tactile cues;Verbal cues    Comprehension Verbalized understanding;Returned demonstration              PT Short Term Goals - 08/06/21 1242       PT SHORT TERM GOAL #1   Title Patient to perform HEP with family supervision for improved strength, balance, transfers, posture, and mobility.    Time 3    Period Weeks    Status Achieved    Target Date 08/09/21               PT Long Term Goals - 07/25/21 1013       PT LONG TERM GOAL #1   Title Patient to perform advanced HEP with family supervision for improved strength, balance, transfers, posture, and mobility.    Time 8    Period Weeks    Status On-going    Target Date 09/13/21      PT  LONG TERM GOAL #2   Title Patient to complete TUG in <30 sec with LRAD in order to decrease risk of falls.    Time 8    Period Weeks    Status On-going    Target Date 09/13/21      PT LONG TERM GOAL #3   Title Patient to demonstrate 5xSTS test in <20 sec in order to decrease risk of falls.    Time 8    Period Weeks    Status On-going    Target Date 09/13/21      PT LONG TERM GOAL #4   Title Patient to demonstrate B LE strength >/=4/5.    Time 8    Period Weeks    Status On-going    Target Date 08/30/21      PT LONG TERM GOAL #5   Title Patient/family to report tolerance for ambulating to dining fall with LRAD with good safety and no fatigue limiting.    Time 8    Period Weeks    Status On-going    Target Date 09/13/21                   Plan - 08/15/21 1638     Clinical Impression Statement Patient arrived to session with husband with report of continued practice with prolonged ambulation with RW. Worked on STS transfers using tactile target for hopeful improvement in anterior trunk lean. Patient able to demonstrate STS with ease once elevated, however requiring min A to stand at normal height chair. Patient reported R foot pain after completing a set of step ups; upon inspection R lateral malleolus slightly edematous and TTP without redness or warmth. Advised patient and husband to ice ankle for edema control. Remainder of session focused on sitting hip strengthening as a result of pain. No further complaints at end of session.    Comorbidities breast CA with B mastectomy, HLD, HTN    PT Treatment/Interventions ADLs/Self Care Home Management;Canalith Repostioning;Cryotherapy;Electrical Stimulation;DME Instruction;Ultrasound;Moist Heat;Iontophoresis 59m/ml Dexamethasone;Gait training;Stair training;Functional mobility training;Therapeutic activities;Therapeutic exercise;Balance training;Neuromuscular re-education;Manual techniques;Patient/family education;Passive range of  motion;Dry needling;Energy conservation;Vestibular;Taping    PT Next Visit Plan progress STS transfers, gait with quad tip cane, R hip stretching and STM, balance activities; add to HEP as appropriate    Consulted and Agree  with Plan of Care Patient;Family member/caregiver    Family Member Consulted husband             Patient will benefit from skilled therapeutic intervention in order to improve the following deficits and impairments:  Abnormal gait, Decreased range of motion, Difficulty walking, Increased fascial restricitons, Decreased endurance, Decreased safety awareness, Dizziness, Increased muscle spasms, Pain, Decreased activity tolerance, Decreased balance, Impaired flexibility, Improper body mechanics, Postural dysfunction, Decreased strength  Visit Diagnosis: Muscle weakness (generalized)  Unsteadiness on feet  Other abnormalities of gait and mobility     Problem List Patient Active Problem List   Diagnosis Date Noted   CVA (cerebral vascular accident) ([place]) 07/09/2021   Stroke-like symptoms 07/08/2021   Benign essential HTN    Dyslipidemia    Prediabetes 06/27/2021   Mixed hyperlipidemia 06/27/2021   Stroke ([place]) 06/26/2021   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 [place], [place], [address] Phone: [phone]   Fax:  [phone]  Name: EDDYE BROXTERMAN MRN: [id number] Date of Birth: 07/25/1941   PHYSICAL THERAPY DISCHARGE SUMMARY  Visits from Start of Care: 8  Current functional level related to goals / functional outcomes: Unable to assess goals. Patient has moved to a rehab facility and is receiving therapy services there   Remaining deficits: Unable to assess   Education / Equipment: HEP  Plan: Patient  agrees to discharge.  Patient goals were not met. Patient is being discharged due to a transfer in therapy services.     Janene Harvey, PT, DPT 09/10/21 1:57 PM

## 2021-08-20 ENCOUNTER — Inpatient Hospital Stay: Payer: Medicare Other | Admitting: Adult Health

## 2021-08-20 ENCOUNTER — Ambulatory Visit: Payer: Medicare Other | Admitting: Physical Therapy

## 2021-08-20 ENCOUNTER — Encounter: Payer: Medicare Other | Admitting: Occupational Therapy

## 2021-08-20 NOTE — Progress Notes (Deleted)
[facility] [address] [place]. [place] [address] [phone]       [place] NOTE  Sandra Becker Date of Birth:  1940-11-23 Medical Record Number:  [id number]   Reason for Referral:  hospital stroke follow up    SUBJECTIVE:   CHIEF COMPLAINT:  No chief complaint on file.   HPI:   Sandra Becker is a 81 year old female with history of hypertension, breast cancer status post mastectomy presented to ED on 05/03/2021 for episode of speech difficulty, right-sided weakness, right facial droop which resolved once arrived to ED. BP 204/92.  Personally reviewed hospitalization pertinent progress notes, lab work and imaging.  Evaluated by stroke team for left hemispheric TIA likely from small vessel disease.  CTA head/neck largely unremarkable.  MRI negative for acute stroke, chronic right cerebellar hemisphere infarct.  EF 60 to 65%.  EEG negative.  LDL 69.  A1c 6.0.  Recommended DAPT for 3 weeks and aspirin alone and continuation of home dose Crestor 5 mg daily.  Returned on 06/24/2021 with RUE weakness, aphasia and dysarthria with MRI showing left BG/CR infarct likely due to small vessel disease given location however due to scattered distribution, cardioembolic source cannot be completely ruled out and recommended 30-day cardiac event monitor to rule out A. fib.  MRA negative.  EEG negative.  Recommend aspirin and Brilinta for 30 days then Plavix alone and increase Crestor from 5 mg to 10 mg daily. Discharged to CIR then back to ALF on 12/10.  Returned to ED on 07/08/2021 for same symptoms of RUE weakness, aphasia and slurred speech with syncopal event. Repeat MR brain evolution of prior left BG/CR infarct, no new/acute findings.  EEG suggestive of cortical dysfunction arising from left temporal lobe nonspecific possibly secondary to underlying infarct but no definite seizures. Felt symptoms likely recrudescence of prior stroke symptoms in setting of  syncope.  Returned back to baseline with mild expressive aphasia.  Orthostatic vitals negative. Changes to BP meds prior to d/c for poorly controlled HTN.  Continued recommending aspirin and Brilinta for 30 days and Plavix alone and Crestor 10 mg daily.  PT/OT recommended outpatient PT/OT and discharged home.   Today, 08/20/2021, patient being seen for initial hospital follow-up accompanied by husband.  Continues to work with PT/OT/SLP.  Residual right-sided weakness ***. Ambulates with RW -denies any recent falls. Aphasia ***. 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 Sima Matas.  Has follow-up with PMR Dr. Letta Pate 2/2.  Denies new or worsening stroke/TIA symptoms.  Completed 30 days of aspirin and Brilinta and currently on Plavix alone as well as Crestor without side effects.  Blood pressure today ***. Recently seen by cardiology -cardiac monitor started - if negative, plans on pursuing loop recorder placement per recent cards OV note.          PERTINENT IMAGING/LABS  Hospitalization 07/08/2021 Code Stroke CT- head No acute abnormality. Small vessel disease. Atrophy. ASPECTS 10.    CTA head & neck- Negative for intracranial large vessel occlusion.  Intracranial atherosclerotic disease with mild irregularity of the middle anterior and posterior cerebral arteries bilaterally.  Minimal atherosclerotic disease in the carotid arteries no carotid or vertebral artery stenosis in the neck. MRI interval expansion of previously identified left basal ganglia and corona radiata infarct.  No associated hemorrhage or significant regional mass-effect.  Underlying age-related cerebral atrophy with moderate chronic microvascular ischemic disease. 2D Echo pending LDL 39 HgbA1c 5.7   Hospitalization 06/24/2021 CT  head - Redemonstrated chronic lacunar infarcts within the bilateral deep gray nuclei and right cerebellar hemisphere.  MRI head - Acute infarct in the  left basal ganglia. No intracranial large vessel occlusion or severe stenosis.  MRA head Mild diffuse narrowing in the left P2 segment. 2D Echo  EF 60-65%, normal ventricular function.  Aortic valve is mild-moderately calcified EEG- within normal limits. No seizures or epileptiform discharges were seen throughout the recording. LDL 69 HgbA1c 6.0   Hospitalization 05/03/2021 CT head -No acute CT finding. Chronic small-vessel ischemic changes throughout the brain as outlined above. -ASPECTS is 10. CTA head and neck: No intracranial large vessel occlusion or correctable proximal stenosis. Some distal vessel atherosclerotic irregularity.  Both carotid bifurcations are widely patent.  Aortic Atherosclerosis (ICD10-I70.0). MRI  1. No acute intracranial abnormality.2. 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 3. 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 2D Echo EF 97-67%, Grade I diastolic dysfunction (impaired relaxation EEG negative for seizure activity  LDL 69 HgbA1c 6.0      ROS:   14 system review of systems performed and negative with exception of ***  PMH:  Past Medical History:  Diagnosis Date   Breast cancer ([place]) 1993   bilateral mastectomy; no chemo or radiation   Glaucoma    Hyperlipidemia    Hypertension     PSH:  Past Surgical History:  Procedure Laterality Date   MASTECTOMY Bilateral 1993   TONSILLECTOMY AND ADENOIDECTOMY  1947   TUBAL LIGATION  1974    Social History:  Social History   Socioeconomic History   Marital status: Married    Spouse name: Not on file   Number of children: 2   Years of education: Not on file   Highest education level: Not on file  Occupational History   Not on file  Tobacco Use   Smoking status: Former    Packs/day: 1.00    Years: 5.00    Pack years: 5.00    Types: Cigarettes    Quit date: 07/30/1963    Years since quitting: 58.0   Smokeless tobacco: Never  Vaping Use   Vaping  Use: Never used  Substance and Sexual Activity   Alcohol use: Not Currently    Alcohol/week: 21.0 standard drinks    Types: 21 Glasses of wine per week    Comment: occ   Drug use: No   Sexual activity: Not on file  Other Topics Concern   Not on file  Social History Narrative   Not on file   Social Determinants of Health   Financial Resource Strain: Not on file  Food Insecurity: Not on file  Transportation Needs: Not on file  Physical Activity: Not on file  Stress: Not on file  Social Connections: Not on file  Intimate Partner Violence: Not on file    Family History:  Family History  Problem Relation Age of Onset   Breast cancer Mother    Colon cancer Neg Hx     Medications:   Current Outpatient Medications on File Prior to Visit  Medication Sig Dispense Refill   acetaminophen (TYLENOL) 325 MG tablet Take 2 tablets (650 mg total) by mouth every 4 (four) hours as needed for mild pain (or temp > 37.5 C (99.5 F)).     amLODipine (NORVASC) 5 MG tablet Take 1 tablet (5 mg total) by mouth daily. 30 tablet 1   brinzolamide (AZOPT) 1 % ophthalmic suspension Place 1 drop into both eyes 2 (two)  times daily.      Calcium Carbonate (CALCIUM 500 PO) Take 1 tablet by mouth every morning.     citalopram (CELEXA) 10 MG tablet Take 1 tablet (10 mg total) by mouth daily. (Patient taking differently: Take 20 mg by mouth daily.) 30 tablet 2   clopidogrel (PLAVIX) 75 MG tablet Plavix 75 mg daily beginning 07/26/2021 30 tablet 1   melatonin 3 MG TABS tablet 1 tablet at bedtime as needed     Multiple Vitamin (MULTIVITAMIN WITH MINERALS) TABS tablet Take 1 tablet by mouth every morning.     Omega-3 Fatty Acids (FISH OIL) 1000 MG CPDR Take 1,000 mg by mouth every morning.     rosuvastatin (CRESTOR) 10 MG tablet Take 1 tablet (10 mg total) by mouth daily. (Patient taking differently: Take 5 mg by mouth daily.) 30 tablet 0   timolol (TIMOPTIC) 0.5 % ophthalmic solution Place 1 drop into both eyes 2  (two) times daily.     valsartan (DIOVAN) 320 MG tablet Take 1 tablet (320 mg total) by mouth daily. 30 tablet 0   No current facility-administered medications on file prior to visit.    Allergies:   Allergies  Allergen Reactions   Crab [Shellfish Allergy] Other (See Comments)    From skin test   Fosamax [Alendronate Sodium] Other (See Comments)    Difficult swallowing   Hydrochlorothiazide Other (See Comments)    hyponatremia   Losartan Cough   Raloxifene Other (See Comments)    Unknown reaction   Ace Inhibitors Cough    Reaction to losartan (pt tolerates valsartan)      OBJECTIVE:  Physical Exam  There were no vitals filed for this visit. There is no height or weight on file to calculate BMI. No results found.  Depression screen [facility] 2/9 07/18/2021  Decreased Interest 0  Down, Depressed, Hopeless 0  PHQ - 2 Score 0  Altered sleeping 0  Tired, decreased energy 0  Change in appetite 0  Feeling bad or failure about yourself  0  Trouble concentrating 0  Moving slowly or fidgety/restless 0  Suicidal thoughts 0  PHQ-9 Score 0  Difficult doing work/chores Not difficult at all     General: well developed, well nourished, seated, in no evident distress Head: head normocephalic and atraumatic.   Neck: supple with no carotid or supraclavicular bruits Cardiovascular: regular rate and rhythm, no murmurs Musculoskeletal: no deformity Skin:  no rash/petichiae Vascular:  Normal pulses all extremities   Neurologic Exam Mental Status: Awake and fully alert. Oriented to place and time. Recent and remote memory intact. Attention span, concentration and fund of knowledge appropriate. Mood and affect appropriate.  Cranial Nerves: Fundoscopic exam reveals sharp disc margins. Pupils equal, briskly reactive to light. Extraocular movements full without nystagmus. Visual fields full to confrontation. Hearing intact. Facial sensation intact. Face, tongue, palate moves normally and  symmetrically.  Motor: Normal bulk and tone. Normal strength in all tested extremity muscles Sensory.: intact to touch , pinprick , position and vibratory sensation.  Coordination: Rapid alternating movements normal in all extremities. Finger-to-nose and heel-to-shin performed accurately bilaterally. Gait and Station: Arises from chair without difficulty. Stance is normal. Gait demonstrates normal stride length and balance with ***. Tandem walk and heel toe ***.  Reflexes: 1+ and symmetric. Toes downgoing.     NIHSS  *** Modified [place]  ***      ASSESSMENT: Sandra Becker is a 81 y.o. year old female with TIA 05/03/2021, L BG/CR infarct 06/24/2021 and  recrudescence of prior stroke symptoms in setting of syncopal event 07/08/2021. Stroke etiology likely in setting of small vessel disease but unable to rule out cardioembolic source.  Vascular risk factors include HTN, HLD, prior cerebellar stroke on imaging, advanced age, CAD, and migraines.      PLAN:  Ischemic stroke, TIA: Residual deficit: ***. Continue clopidogrel 75 mg daily  and Crestor 10 mg daily for secondary stroke prevention.  Discussed secondary stroke prevention measures and importance of close PCP follow up for aggressive stroke risk factor management. I have gone over the pathophysiology of stroke, warning signs and symptoms, risk factors and their management in some detail with instructions to go to the closest emergency room for symptoms of concern. HTN: BP goal <130/90.  Stable on *** per PCP HLD: LDL goal <70. Recent LDL 39 on Crestor 10 mg daily.  DMII: A1c goal<7.0. Recent A1c ***.     Follow up in *** or call earlier if needed   CC:  [facility] provider: Dr. Leonie Man PCP: Kelton Pillar, MD    I spent *** minutes of face-to-face and non-face-to-face time with patient.  This included previsit chart review including review of recent hospitalization, lab review, study review, order entry, electronic health record  documentation, patient education regarding recent stroke including etiology, secondary stroke prevention measures and importance of managing stroke risk factors, residual deficits and typical recovery time and answered all other questions to patient satisfaction   Frann Rider, AGNP-BC  [facility] [address] [place], [place] [address]  Phone [phone] Fax [phone] Note: This document was prepared with digital dictation and possible smart phrase technology. Any transcriptional errors that result from this process are unintentional.

## 2021-08-21 ENCOUNTER — Telehealth: Payer: Self-pay

## 2021-08-21 NOTE — Telephone Encounter (Signed)
Husband called stating pt  severe depressed , and the medication citalopram is not working and they cant get pt to drink the rest of her meds . Please advise .

## 2021-08-22 ENCOUNTER — Ambulatory Visit: Payer: Medicare Other | Admitting: Physical Therapy

## 2021-08-22 ENCOUNTER — Ambulatory Visit: Payer: Medicare Other

## 2021-08-22 ENCOUNTER — Ambulatory Visit: Payer: Medicare Other | Admitting: Occupational Therapy

## 2021-08-22 NOTE — Telephone Encounter (Signed)
Okay I notified pt husband and he said her PCP Would recommend to go to [facility] where she will have primary care as well as psychological  care.

## 2021-08-23 ENCOUNTER — Inpatient Hospital Stay: Payer: Medicare Other | Admitting: Adult Health

## 2021-08-23 ENCOUNTER — Inpatient Hospital Stay (HOSPITAL_BASED_OUTPATIENT_CLINIC_OR_DEPARTMENT_OTHER)
Admission: EM | Admit: 2021-08-23 | Discharge: 2021-08-28 | DRG: 641 | Disposition: A | Discharge status: Skilled Nursing Facility | Payer: Medicare Other | Attending: Internal Medicine | Admitting: Internal Medicine

## 2021-08-23 ENCOUNTER — Other Ambulatory Visit: Payer: Self-pay

## 2021-08-23 ENCOUNTER — Encounter (HOSPITAL_COMMUNITY): Payer: Self-pay | Admitting: Family Medicine

## 2021-08-23 DIAGNOSIS — R531 Weakness: Secondary | ICD-10-CM

## 2021-08-23 DIAGNOSIS — Z853 Personal history of malignant neoplasm of breast: Secondary | ICD-10-CM | POA: Diagnosis not present

## 2021-08-23 DIAGNOSIS — E876 Hypokalemia: Secondary | ICD-10-CM | POA: Diagnosis present

## 2021-08-23 DIAGNOSIS — Z20822 Contact with and (suspected) exposure to covid-19: Secondary | ICD-10-CM | POA: Diagnosis not present

## 2021-08-23 DIAGNOSIS — R1310 Dysphagia, unspecified: Secondary | ICD-10-CM | POA: Diagnosis present

## 2021-08-23 DIAGNOSIS — I69354 Hemiplegia and hemiparesis following cerebral infarction affecting left non-dominant side: Secondary | ICD-10-CM

## 2021-08-23 DIAGNOSIS — F32A Depression, unspecified: Secondary | ICD-10-CM | POA: Diagnosis present

## 2021-08-23 DIAGNOSIS — Z87891 Personal history of nicotine dependence: Secondary | ICD-10-CM

## 2021-08-23 DIAGNOSIS — E785 Hyperlipidemia, unspecified: Secondary | ICD-10-CM | POA: Diagnosis present

## 2021-08-23 DIAGNOSIS — D72829 Elevated white blood cell count, unspecified: Secondary | ICD-10-CM | POA: Diagnosis present

## 2021-08-23 DIAGNOSIS — I10 Essential (primary) hypertension: Secondary | ICD-10-CM | POA: Diagnosis present

## 2021-08-23 DIAGNOSIS — E871 Hypo-osmolality and hyponatremia: Secondary | ICD-10-CM | POA: Diagnosis not present

## 2021-08-23 DIAGNOSIS — I639 Cerebral infarction, unspecified: Secondary | ICD-10-CM | POA: Diagnosis present

## 2021-08-23 DIAGNOSIS — Z91013 Allergy to seafood: Secondary | ICD-10-CM | POA: Diagnosis not present

## 2021-08-23 DIAGNOSIS — D72828 Other elevated white blood cell count: Secondary | ICD-10-CM | POA: Diagnosis present

## 2021-08-23 DIAGNOSIS — Z9013 Acquired absence of bilateral breasts and nipples: Secondary | ICD-10-CM

## 2021-08-23 DIAGNOSIS — Z803 Family history of malignant neoplasm of breast: Secondary | ICD-10-CM | POA: Diagnosis not present

## 2021-08-23 LAB — BASIC METABOLIC PANEL
Anion gap: 9 (ref 5–15)
Anion gap: 8 (ref 5–15)
BUN: 7 mg/dL — ABNORMAL LOW (ref 8–23)
BUN: 6 mg/dL — ABNORMAL LOW (ref 8–23)
CO2: 24 mmol/L (ref 22–32)
CO2: 25 mmol/L (ref 22–32)
Calcium: 8.4 mg/dL — ABNORMAL LOW (ref 8.9–10.3)
Calcium: 8.2 mg/dL — ABNORMAL LOW (ref 8.9–10.3)
Chloride: 90 mmol/L — ABNORMAL LOW (ref 98–111)
Chloride: 92 mmol/L — ABNORMAL LOW (ref 98–111)
Creatinine, Ser: 0.37 mg/dL — ABNORMAL LOW (ref 0.44–1.00)
Creatinine, Ser: 0.3 mg/dL — ABNORMAL LOW (ref 0.44–1.00)
GFR, Estimated: >60 mL/min (ref >60)
GFR, Estimated: >60 mL/min (ref >60)
Glucose, Bld: 109 mg/dL — ABNORMAL HIGH (ref 70–99)
Glucose, Bld: 106 mg/dL — ABNORMAL HIGH (ref 70–99)
Potassium: 3 mmol/L — ABNORMAL LOW (ref 3.5–5.1)
Potassium: 2.9 mmol/L — ABNORMAL LOW (ref 3.5–5.1)
Sodium: 123 mmol/L — ABNORMAL LOW (ref 135–145)
Sodium: 125 mmol/L — ABNORMAL LOW (ref 135–145)

## 2021-08-23 LAB — URINALYSIS, ROUTINE W REFLEX MICROSCOPIC
Bilirubin Urine: NEGATIVE
Glucose, UA: NEGATIVE mg/dL
Hgb urine dipstick: NEGATIVE
Nitrite: NEGATIVE
Protein, ur: NEGATIVE mg/dL
Specific Gravity, Urine: <1.005 — ABNORMAL LOW (ref 1.005–1.030)
pH: 6.5 (ref 5.0–8.0)

## 2021-08-23 LAB — CBC WITH DIFFERENTIAL/PLATELET
Abs Immature Granulocytes: 0.08 10*3/uL — ABNORMAL HIGH (ref 0.00–0.07)
Basophils Absolute: 0 10*3/uL (ref 0.0–0.1)
Basophils Relative: 0 %
Eosinophils Absolute: 0 10*3/uL (ref 0.0–0.5)
Eosinophils Relative: 0 %
HCT: 38.8 % (ref 36.0–46.0)
Hemoglobin: 14.2 g/dL (ref 12.0–15.0)
Immature Granulocytes: 1 %
Lymphocytes Relative: 11 %
Lymphs Abs: 1.4 10*3/uL (ref 0.7–4.0)
MCH: 30 pg (ref 26.0–34.0)
MCHC: 36.6 g/dL — ABNORMAL HIGH (ref 30.0–36.0)
MCV: 81.9 fL (ref 80.0–100.0)
Monocytes Absolute: 1.3 10*3/uL — ABNORMAL HIGH (ref 0.1–1.0)
Monocytes Relative: 10 %
Neutro Abs: 9.4 10*3/uL — ABNORMAL HIGH (ref 1.7–7.7)
Neutrophils Relative %: 78 %
Platelets: 423 10*3/uL — ABNORMAL HIGH (ref 150–400)
RBC: 4.74 MIL/uL (ref 3.87–5.11)
RDW: 12.2 % (ref 11.5–15.5)
WBC: 12.2 10*3/uL — ABNORMAL HIGH (ref 4.0–10.5)
nRBC: 0 % (ref 0.0–0.2)

## 2021-08-23 LAB — COMPREHENSIVE METABOLIC PANEL
ALT: 24 U/L (ref 0–44)
AST: 19 U/L (ref 15–41)
Albumin: 4.3 g/dL (ref 3.5–5.0)
Alkaline Phosphatase: 35 U/L — ABNORMAL LOW (ref 38–126)
Anion gap: 9 (ref 5–15)
BUN: 10 mg/dL (ref 8–23)
CO2: 25 mmol/L (ref 22–32)
Calcium: 9.1 mg/dL (ref 8.9–10.3)
Chloride: 82 mmol/L — ABNORMAL LOW (ref 98–111)
Creatinine, Ser: 0.56 mg/dL (ref 0.44–1.00)
GFR, Estimated: >60 mL/min (ref >60)
Glucose, Bld: 142 mg/dL — ABNORMAL HIGH (ref 70–99)
Potassium: 3.6 mmol/L (ref 3.5–5.1)
Sodium: 116 mmol/L — CL (ref 135–145)
Total Bilirubin: 0.7 mg/dL (ref 0.3–1.2)
Total Protein: 6.8 g/dL (ref 6.5–8.1)

## 2021-08-23 LAB — RESP PANEL BY RT-PCR (FLU A&B, COVID) ARPGX2
Influenza A by PCR: NEGATIVE
Influenza B by PCR: NEGATIVE
SARS Coronavirus 2 by RT PCR: NEGATIVE

## 2021-08-23 LAB — OSMOLALITY, URINE: Osmolality, Ur: 135 mOsm/kg — ABNORMAL LOW (ref 300–900)

## 2021-08-23 LAB — SODIUM, URINE, RANDOM: Sodium, Ur: <10 mmol/L

## 2021-08-23 MED ORDER — AMLODIPINE BESYLATE 5 MG PO TABS
5.0000 mg | ORAL_TABLET | Freq: Every day | ORAL | Status: DC
  Administered 2021-08-24 – 2021-08-28 (×5): 5 mg via ORAL
  Filled 2021-08-23 (×5): qty 1

## 2021-08-23 MED ORDER — ENOXAPARIN SODIUM 40 MG/0.4ML IJ SOSY
40.0000 mg | PREFILLED_SYRINGE | INTRAMUSCULAR | Status: DC
  Administered 2021-08-23 – 2021-08-27 (×5): 40 mg via SUBCUTANEOUS
  Filled 2021-08-23 (×5): qty 0.4

## 2021-08-23 MED ORDER — SODIUM CHLORIDE 0.9 % IV SOLN: INTRAVENOUS | Status: DC

## 2021-08-23 MED ORDER — ACETAMINOPHEN 325 MG PO TABS: 650.0000 mg | ORAL_TABLET | Freq: Four times a day (QID) | ORAL | Status: DC | PRN

## 2021-08-23 MED ORDER — ONDANSETRON HCL 4 MG/2ML IJ SOLN: 4.0000 mg | Freq: Four times a day (QID) | INTRAMUSCULAR | Status: DC | PRN

## 2021-08-23 MED ORDER — SENNOSIDES-DOCUSATE SODIUM 8.6-50 MG PO TABS: 1.0000 | ORAL_TABLET | Freq: Every evening | ORAL | Status: DC | PRN

## 2021-08-23 MED ORDER — BRINZOLAMIDE 1 % OP SUSP
1.0000 [drp] | Freq: Two times a day (BID) | OPHTHALMIC | Status: DC
  Administered 2021-08-23 – 2021-08-28 (×10): 1 [drp] via OPHTHALMIC
  Filled 2021-08-23: qty 10

## 2021-08-23 MED ORDER — SODIUM CHLORIDE 0.9% FLUSH
3.0000 mL | Freq: Two times a day (BID) | INTRAVENOUS | Status: DC
  Administered 2021-08-24 – 2021-08-27 (×6): 3 mL via INTRAVENOUS

## 2021-08-23 MED ORDER — ROSUVASTATIN CALCIUM 10 MG PO TABS
10.0000 mg | ORAL_TABLET | Freq: Every day | ORAL | Status: DC
  Administered 2021-08-24 – 2021-08-28 (×5): 10 mg via ORAL
  Filled 2021-08-23 (×5): qty 1

## 2021-08-23 MED ORDER — ONDANSETRON HCL 4 MG PO TABS: 4.0000 mg | ORAL_TABLET | Freq: Four times a day (QID) | ORAL | Status: DC | PRN

## 2021-08-23 MED ORDER — ACETAMINOPHEN 650 MG RE SUPP: 650.0000 mg | Freq: Four times a day (QID) | RECTAL | Status: DC | PRN

## 2021-08-23 MED ORDER — SODIUM CHLORIDE 0.9 % IV SOLN
Freq: Once | INTRAVENOUS | Status: AC
Start: 2021-08-23 — End: 2021-08-23

## 2021-08-23 MED ORDER — TIMOLOL MALEATE 0.5 % OP SOLN
1.0000 [drp] | Freq: Two times a day (BID) | OPHTHALMIC | Status: DC
  Administered 2021-08-24 – 2021-08-28 (×9): 1 [drp] via OPHTHALMIC
  Filled 2021-08-23: qty 5

## 2021-08-23 MED ORDER — POTASSIUM CHLORIDE CRYS ER 20 MEQ PO TBCR
20.0000 meq | EXTENDED_RELEASE_TABLET | Freq: Three times a day (TID) | ORAL | Status: DC
  Administered 2021-08-23 – 2021-08-24 (×2): 20 meq via ORAL
  Filled 2021-08-23 (×2): qty 1

## 2021-08-23 MED ORDER — CLOPIDOGREL BISULFATE 75 MG PO TABS
75.0000 mg | ORAL_TABLET | Freq: Every day | ORAL | Status: DC
Start: 2021-08-24 — End: 2021-08-28
  Administered 2021-08-24 – 2021-08-28 (×5): 75 mg via ORAL
  Filled 2021-08-23 (×5): qty 1

## 2021-08-23 MED ORDER — MELATONIN 3 MG PO TABS
3.0000 mg | ORAL_TABLET | Freq: Every evening | ORAL | Status: DC | PRN
  Administered 2021-08-27: 3 mg via ORAL
  Filled 2021-08-23: qty 1

## 2021-08-23 NOTE — ED Notes (Signed)
Assisted patient to bathroom,unable to void.

## 2021-08-23 NOTE — H&P (Addendum)
History and Physical    Sandra Becker YKD:[id number] DOB: 05-04-1941 DOA: 08/23/2021  PCP: Kelton Pillar, MD   Patient coming from: ILF   Chief Complaint: General weakness, fatigue   HPI: Sandra Becker is a pleasant 81 y.o. female with medical history significant for hypertension, hyperlipidemia, and ischemic stroke in November 2022, now presenting to the emergency department with general weakness and fatigue.  Patient had been progressing well with therapy after the recent CVA until 4 days ago.  For the past 4 days, she has had increased fatigue and generalized weakness.  She has had decreased appetite for the past few days and drink a lot of water yesterday.  She was previously on chlorthalidone but that was stopped last month.  She started Celexa approximately 1 month ago but does not believe it is helping at all with her depression.  She denies headache or new focal numbness or weakness.  [facility] ED Course: Upon arrival to the ED, patient is found to be afebrile, saturating well on room air, and with blood pressure 150/72.  Chemistry panel notable for glucose 142 and sodium 116.  CBC with WBC 12,200 and platelets 1 23,000.  COVID and influenza were negative.  Urine osmolality was 135 and urine sodium less than 10.  Patient was started on normal saline infusion and transferred to [facility] for admission.  Review of Systems:  All other systems reviewed and apart from HPI, are negative.  Past Medical History:  Diagnosis Date   Breast cancer ([place]) 1993   bilateral mastectomy; no chemo or radiation   Glaucoma    Hyperlipidemia    Hypertension     Past Surgical History:  Procedure Laterality Date   MASTECTOMY Bilateral [place]    Social History:   reports that she quit smoking about 58 years ago. Her smoking use included cigarettes. She has a 5.00 pack-year smoking history. She has never used smokeless  tobacco. She reports that she does not currently use alcohol after a past usage of about 21.0 standard drinks per week. She reports that she does not use drugs.  Allergies  Allergen Reactions   Crab [Shellfish Allergy] Other (See Comments)    From skin test   Fosamax [Alendronate Sodium] Other (See Comments)    Difficult swallowing   Hydrochlorothiazide Other (See Comments)    hyponatremia   Losartan Cough   Raloxifene Other (See Comments)    Unknown reaction   Ace Inhibitors Cough    Reaction to losartan (pt tolerates valsartan)    Family History  Problem Relation Age of Onset   Breast cancer Mother    Colon cancer Neg Hx      Prior to Admission medications   Medication Sig Start Date End Date Taking? Authorizing Provider  amLODipine (NORVASC) 5 MG tablet Take 1 tablet (5 mg total) by mouth daily. 07/10/21  Yes Bonnielee Haff, MD  brinzolamide (AZOPT) 1 % ophthalmic suspension Place 1 drop into both eyes 2 (two) times daily.    Yes [provider]  citalopram (CELEXA) 10 MG tablet Take 1 tablet (10 mg total) by mouth daily. Patient taking differently: Take 20 mg by mouth daily. 08/07/21  Yes Kirsteins, Luanna Salk, MD  clopidogrel (PLAVIX) 75 MG tablet Plavix 75 mg daily beginning 07/26/2021 07/05/21  Yes Angiulli, Lavon Paganini, PA-C  melatonin 3 MG TABS tablet 1 tablet at bedtime as needed   Yes [provider]  rosuvastatin (CRESTOR) 10 MG tablet Take 1 tablet (10 mg total) by mouth daily. Patient taking differently: Take 5 mg by mouth daily. 07/05/21  Yes Angiulli, Lavon Paganini, PA-C  timolol (TIMOPTIC) 0.5 % ophthalmic solution Place 1 drop into both eyes 2 (two) times daily. 02/12/21  Yes [provider]  valsartan (DIOVAN) 320 MG tablet Take 1 tablet (320 mg total) by mouth daily. 07/05/21  Yes Angiulli, Lavon Paganini, PA-C  acetaminophen (TYLENOL) 325 MG tablet Take 2 tablets (650 mg total) by mouth every 4 (four) hours as needed for mild pain (or temp > 37.5 C  (99.5 F)). 07/05/21   Cathlyn Parsons, PA-C    Physical Exam: Vitals:   08/23/21 1300 08/23/21 1400 08/23/21 1759 08/23/21 1841  BP: (!) 165/75 (!) 141/73 140/80 (!) 158/72  Pulse:  88 90 86  Resp:  16 18 18   Temp:    97.8 F (36.6 C)  TempSrc:    Oral  SpO2:  99% 98% 98%  Weight:      Height:        Constitutional: NAD, calm  Eyes: PERTLA, lids and conjunctivae normal ENMT: Mucous membranes are moist. Posterior pharynx clear of any exudate or lesions.   Neck: supple, no masses  Respiratory:  no wheezing, no crackles. No accessory muscle use.  Cardiovascular: S1 & S2 heard, regular rate and rhythm. No extremity edema.   Abdomen: No distension, no tenderness, soft. Bowel sounds active.  Musculoskeletal: no clubbing / cyanosis. No joint deformity upper and lower extremities.   Skin: no significant rashes, lesions, ulcers. Warm, dry, well-perfused. Neurologic: CN 2-12 grossly intact. Moving all extremities. Alert and oriented.  Psychiatric: Calm. Cooperative.    Labs and Imaging on Admission: I have personally reviewed following labs and imaging studies  CBC: Recent Labs  Lab 08/23/21 1030  WBC 12.2*  NEUTROABS 9.4*  HGB 14.2  HCT 38.8  MCV 81.9  PLT 182*   Basic Metabolic Panel: Recent Labs  Lab 08/23/21 1030 08/23/21 2000  NA 116* 123*  K 3.6 3.0*  CL 82* 90*  CO2 25 24  GLUCOSE 142* 109*  BUN 10 7*  CREATININE 0.56 0.37*  CALCIUM 9.1 8.4*   GFR: Estimated Creatinine Clearance: 50.5 mL/min (A) (by C-G formula based on SCr of 0.37 mg/dL (L)). Liver Function Tests: Recent Labs  Lab 08/23/21 1030  AST 19  ALT 24  ALKPHOS 35*  BILITOT 0.7  PROT 6.8  ALBUMIN 4.3   No results for input(s): LIPASE, AMYLASE in the last 168 hours. No results for input(s): AMMONIA in the last 168 hours. Coagulation Profile: No results for input(s): INR, PROTIME in the last 168 hours. Cardiac Enzymes: No results for input(s): CKTOTAL, CKMB, CKMBINDEX, TROPONINI in the  last 168 hours. BNP (last 3 results) No results for input(s): PROBNP in the last 8760 hours. HbA1C: No results for input(s): HGBA1C in the last 72 hours. CBG: No results for input(s): GLUCAP in the last 168 hours. Lipid Profile: No results for input(s): CHOL, HDL, LDLCALC, TRIG, CHOLHDL, LDLDIRECT in the last 72 hours. Thyroid Function Tests: No results for input(s): TSH, T4TOTAL, FREET4, T3FREE, THYROIDAB in the last 72 hours. Anemia Panel: No results for input(s): VITAMINB12, FOLATE, FERRITIN, TIBC, IRON, RETICCTPCT in the last 72 hours. Urine analysis:    Component Value Date/Time   COLORURINE COLORLESS (A) 08/23/2021 1130   APPEARANCEUR CLEAR 08/23/2021 1130   LABSPEC <1.005 (L) 08/23/2021 1130   PHURINE 6.5 08/23/2021 1130  GLUCOSEU NEGATIVE 08/23/2021 1130   HGBUR NEGATIVE 08/23/2021 1130   BILIRUBINUR NEGATIVE 08/23/2021 1130   KETONESUR TRACE (A) 08/23/2021 1130   PROTEINUR NEGATIVE 08/23/2021 1130   UROBILINOGEN 0.2 04/27/2011 2131   NITRITE NEGATIVE 08/23/2021 1130   LEUKOCYTESUR SMALL (A) 08/23/2021 1130   Sepsis Labs: @LABRCNTIP (procalcitonin:4,lacticidven:4) ) Recent Results (from the past 240 hour(s))  Resp Panel by RT-PCR (Flu A&B, Covid) Nasopharyngeal Swab     Status: None   Collection Time: 08/23/21 11:40 AM   Specimen: Nasopharyngeal Swab; Nasopharyngeal(NP) swabs in vial transport medium  Result Value Ref Range Status   SARS Coronavirus 2 by RT PCR NEGATIVE NEGATIVE Final    Comment: (NOTE) SARS-CoV-2 target nucleic acids are NOT DETECTED.  The SARS-CoV-2 RNA is generally detectable in upper respiratory specimens during the acute phase of infection. The lowest concentration of SARS-CoV-2 viral copies this assay can detect is 138 copies/mL. A negative result does not preclude SARS-Cov-2 infection and should not be used as the sole basis for treatment or other patient management decisions. A negative result may occur with  improper specimen  collection/handling, submission of specimen other than nasopharyngeal swab, presence of viral mutation(s) within the areas targeted by this assay, and inadequate number of viral copies(<138 copies/mL). A negative result must be combined with clinical observations, patient history, and epidemiological information. The expected result is Negative.  Fact Sheet for Patients:  EntrepreneurPulse.com.au  Fact Sheet for Healthcare Providers:  IncredibleEmployment.be  This test is no t yet approved or cleared by the [place] FDA and  has been authorized for detection and/or diagnosis of SARS-CoV-2 by FDA under an Emergency Use Authorization (EUA). This EUA will remain  in effect (meaning this test can be used) for the duration of the COVID-19 declaration under Section 564(b)(1) of the Act, 21 U.S.C.section 360bbb-3(b)(1), unless the authorization is terminated  or revoked sooner.       Influenza A by PCR NEGATIVE NEGATIVE Final   Influenza B by PCR NEGATIVE NEGATIVE Final    Comment: (NOTE) The Xpert Xpress SARS-CoV-2/FLU/RSV plus assay is intended as an aid in the diagnosis of influenza from Nasopharyngeal swab specimens and should not be used as a sole basis for treatment. Nasal washings and aspirates are unacceptable for Xpert Xpress SARS-CoV-2/FLU/RSV testing.  Fact Sheet for Patients: EntrepreneurPulse.com.au  Fact Sheet for Healthcare Providers: IncredibleEmployment.be  This test is not yet approved or cleared by the [place] FDA and has been authorized for detection and/or diagnosis of SARS-CoV-2 by FDA under an Emergency Use Authorization (EUA). This EUA will remain in effect (meaning this test can be used) for the duration of the COVID-19 declaration under Section 564(b)(1) of the Act, 21 U.S.C. section 360bbb-3(b)(1), unless the authorization is terminated or revoked.  Performed at [facility], [address], [place], [place] [address]      Radiological Exams on Admission: No results found.  Assessment/Plan   1. Hyponatremia  - Presents with 4 days of increased fatigue and general weakness and found to have serum sodium of 116, down from 131 last month  - No severe symptoms, appears euvolemic, urine osm 135 and urine sodium <10  - Give 1 liter NS then repeat urine sodium and osm, restrict free water, hold valsartan and Celexa, follow serial chem panels    ADDENDUM: Repeat sodium level is 123. Will stop IVF for now and continue frequent chem panels.    2. Hypertension  - Hold ARB while correcting sodium, continue amlodipine  3. Hx of CVA  - Hx of ischemic CVA in Nov 2022  - Had been progressing well with therapy until 4 days prior to admission  - Continue Plavix and Crestor     DVT prophylaxis: Lovenox  Code Status: Full  Level of Care: Level of care: Progressive Family Communication: Husband and son at bedside  Disposition Plan:  Patient is from: ILF  Anticipated d/c is to: TBD Anticipated d/c date is: 1/28 or 08/26/21  Patient currently: Pending improvement in sodium, may need PT eval if gen weakness fails to improve with sodium correction   Consults called: none  Admission status: Observation     Vianne Bulls, MD [facility]  08/23/2021, 8:39 PM

## 2021-08-23 NOTE — ED Provider Notes (Signed)
Emergency Department Provider Note   I have reviewed the triage vital signs and the nursing notes.   HISTORY  Chief Complaint Labs Only   HPI Sandra Becker is a 81 y.o. female with past medical history reviewed below including prior strokes presents to the emergency department with her husband.  The husband provides most of the history.  He states that she has been having some sudden increased weakness and mobility issues.  They are currently in an independent living situation and are seeking placement into a rehab center.  The facility sent them here, apparently for blood work, in order to make this transition.  The patient is not any fevers.  No change to medications.  She been eating and drinking normally although husband states slightly reduced in the last several days.  He noticed an abrupt change 3 days ago.  She has not been excessively drinking.  Denies fluid retention.  She is not having any pain.    Past Medical History:  Diagnosis Date   Breast cancer [facility]) 1993   bilateral mastectomy; no chemo or radiation   Glaucoma    Hyperlipidemia    Hypertension     Review of Systems  Constitutional: No fever/chills. Positive generalized weakness.  Cardiovascular: Denies chest pain. Respiratory: Denies shortness of breath. Gastrointestinal: No abdominal pain. Genitourinary: Negative for dysuria. Musculoskeletal: Negative for back pain. Skin: Negative for rash. Neurological: Negative for headaches.   ____________________________________________   PHYSICAL EXAM:  VITAL SIGNS: ED Triage Vitals  Enc Vitals Group     BP 08/23/21 1000 129/63     Pulse Rate 08/23/21 1000 81     Resp 08/23/21 1000 16     Temp 08/23/21 1000 97.9 F (36.6 C)     Temp src --      SpO2 08/23/21 1000 99 %     Weight 08/23/21 0959 147 lb (66.7 kg)     Height 08/23/21 0959 5\' 5"  (1.651 m)   Constitutional: Alert and oriented. Well appearing and in no acute distress. Slow to respond at  times but appropriate.  Eyes: Conjunctivae are normal.  Head: Atraumatic. Nose: No congestion/rhinnorhea. Mouth/Throat: Mucous membranes are moist.  Neck: No stridor.   Cardiovascular: Normal rate, regular rhythm. Good peripheral circulation. Grossly normal heart sounds.   Respiratory: Normal respiratory effort.  No retractions. Lungs CTAB. Gastrointestinal: Soft and nontender. No distention.  Musculoskeletal: No gross deformities of extremities. Neurologic:  Normal speech and language.  Skin:  Skin is warm, dry and intact. No rash noted.   ____________________________________________   LABS (all labs ordered are listed, but only abnormal results are displayed)  Labs Reviewed  COMPREHENSIVE METABOLIC PANEL - Abnormal; Notable for the following components:      Result Value   Sodium 116 (*)    Chloride 82 (*)    Glucose, Bld 142 (*)    Alkaline Phosphatase 35 (*)    All other components within normal limits  CBC WITH DIFFERENTIAL/PLATELET - Abnormal; Notable for the following components:   WBC 12.2 (*)    MCHC 36.6 (*)    Platelets 423 (*)    Neutro Abs 9.4 (*)    Monocytes Absolute 1.3 (*)    Abs Immature Granulocytes 0.08 (*)    All other components within normal limits  URINALYSIS, ROUTINE W REFLEX MICROSCOPIC - Abnormal; Notable for the following components:   Color, Urine COLORLESS (*)    Specific Gravity, Urine <1.005 (*)    Ketones, ur TRACE (*)  Leukocytes,Ua SMALL (*)    Bacteria, UA RARE (*)    All other components within normal limits  OSMOLALITY, URINE - Abnormal; Notable for the following components:   Osmolality, Ur 135 (*)    All other components within normal limits  RESP PANEL BY RT-PCR (FLU A&B, COVID) ARPGX2  URINE CULTURE  SODIUM, URINE, RANDOM    ____________________________________________   PROCEDURES  Procedure(s) performed:   Procedures  CRITICAL CARE Performed by: Margette Fast Total critical care time: 35 minutes Critical care  time was exclusive of separately billable procedures and treating other patients. Critical care was necessary to treat or prevent imminent or life-threatening deterioration. Critical care was time spent personally by me on the following activities: development of treatment plan with patient and/or surrogate as well as nursing, discussions with consultants, evaluation of patient's response to treatment, examination of patient, obtaining history from patient or surrogate, ordering and performing treatments and interventions, ordering and review of laboratory studies, ordering and review of radiographic studies, pulse oximetry and re-evaluation of patient's condition.  Nanda Quinton, MD Emergency Medicine  ____________________________________________   INITIAL IMPRESSION / ASSESSMENT AND PLAN / ED COURSE  Pertinent labs & imaging results that were available during my care of the patient were reviewed by me and considered in my medical decision making (see chart for details).   This patient is Presenting for Evaluation of weakness, which does require a range of treatment options, and is a complaint that involves a high risk of morbidity and mortality.  The Differential Diagnoses include electrolyte abnormality, AKI, metabolic encephalopathy, ICH, CVA.   Critical Interventions- IVF and meds/rapid assessment.    Medications  0.9 %  sodium chloride infusion ( Intravenous New Bag/Given 08/23/21 1239)    Reassessment after intervention: Patient continues to look well.    I did obtain Additional Historical Information from the patient's husband at bedside.  I decided to review pertinent External Data, and in summary no recent ED visits.   Clinical Laboratory Tests Ordered, included CBC and chemistry.  The patient's sodium is come back at 116, much lower than her prior values in the low 130s/high 120s.  This may explain her sudden worsening weakness.  Patient with very mild leukocytosis.  No severe  anemia.  COVID and flu are negative.  No evidence of urinary tract infection.  Radiologic Tests: Considered CT imaging of the head chest x-ray but patient has an intact neurologic exam although somewhat slower, per husband.  Lungs are clear with normal vital signs including oxygen saturation.  Will defer these imagings for now.  Cardiac Monitor Tracing which shows NSR.  Social Determinants of Health Risk former smoker.   Reevaluation with update and discussion with patient and husband regarding the lab findings. May explain some symptoms. Plan for admit for sodium correction. No neuro findings to prompt hypertonic saline.   Consult complete with Hospitalist.   Discussed patient's case with [facility], Dr. Olevia Bowens to request admission. Patient and family (if present) updated with plan.    Medical Decision Making: Summary:  Patient presents to the ED with fatigue and weakness (generally). No focal neuro deficits on my exam. Na resulted with level of 116. Patient does not appear volume overloaded. Will begin gentle IVF infusion (NS) and admit.   Disposition: admit  ____________________________________________  FINAL CLINICAL IMPRESSION(S) / ED DIAGNOSES  Final diagnoses:  Hyponatremia  Generalized weakness     Note:  This document was prepared using Dragon voice recognition software and may include unintentional dictation  errors.  Nanda Quinton, MD, [facility] Emergency Medicine    Tamekia Rotter, [facility], MD 08/23/21 [phone]

## 2021-08-23 NOTE — Progress Notes (Signed)
Patient arrived in room.  Denies pain, N/V/D.  Cardiac monitoring started.  Husband [name] and son Quita Skye at bedside.    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  Per husband, deficits include expressive aphasia and "slow movements."  Patient has been receiving "neurotherapy," and has "passed benchmarks" in PT, SLP, until 4 days ago when "she started going downhill."  Per husband, patient drank "a lot of water yesterday, so much that it started coming up again."  Angie Fava, RN

## 2021-08-23 NOTE — Plan of Care (Signed)

## 2021-08-23 NOTE — ED Triage Notes (Signed)
Pt to ED from [facility] requesting blood and urine test. Reports pt is transitioning to their rehab unit and the facility sent them here to obtain. Pt voicing no complaints.

## 2021-08-23 NOTE — Plan of Care (Signed)
Plan of Care Note for accepted transfer  Patient: Sandra Becker    LEZ:[id number]  DOA: 08/23/2021     Facility requesting transfer: [facility]. Requesting Provider: Nanda Quinton, ND. Reason for transfer: Hyponatremia. Facility course: The patient was sent from her facility to be evaluated due to weakness for several days.  Work-up showed hyponatremia of 116 mmol/L.  She is not on diuretics.  According to wife to call Dr. Laverta Baltimore, she has not had polydipsia.  Accepted to progressive bed.  I added a urine osmolality and urine sodium.  Plan of care: The patient is accepted for admission to Progressive unit, at [facility].   Author: Reubin Milan, MD  08/23/2021  Check [email or web address] for on-call coverage.  Nursing staff, Please call [place] number on Amion as soon as patient's arrival, so appropriate admitting provider can evaluate the pt.

## 2021-08-24 DIAGNOSIS — D72828 Other elevated white blood cell count: Secondary | ICD-10-CM

## 2021-08-24 DIAGNOSIS — D72829 Elevated white blood cell count, unspecified: Secondary | ICD-10-CM | POA: Diagnosis present

## 2021-08-24 DIAGNOSIS — F32A Depression, unspecified: Secondary | ICD-10-CM

## 2021-08-24 DIAGNOSIS — R531 Weakness: Secondary | ICD-10-CM

## 2021-08-24 DIAGNOSIS — E876 Hypokalemia: Secondary | ICD-10-CM | POA: Diagnosis not present

## 2021-08-24 DIAGNOSIS — E871 Hypo-osmolality and hyponatremia: Secondary | ICD-10-CM | POA: Diagnosis not present

## 2021-08-24 DIAGNOSIS — I10 Essential (primary) hypertension: Secondary | ICD-10-CM | POA: Diagnosis not present

## 2021-08-24 LAB — CBC
HCT: 37.7 % (ref 36.0–46.0)
Hemoglobin: 13.6 g/dL (ref 12.0–15.0)
MCH: 30.7 pg (ref 26.0–34.0)
MCHC: 36.1 g/dL — ABNORMAL HIGH (ref 30.0–36.0)
MCV: 85.1 fL (ref 80.0–100.0)
Platelets: 365 10*3/uL (ref 150–400)
RBC: 4.43 MIL/uL (ref 3.87–5.11)
RDW: 12.6 % (ref 11.5–15.5)
WBC: 8.2 10*3/uL (ref 4.0–10.5)
nRBC: 0 % (ref 0.0–0.2)

## 2021-08-24 LAB — BASIC METABOLIC PANEL
Anion gap: 10 (ref 5–15)
Anion gap: 9 (ref 5–15)
Anion gap: 9 (ref 5–15)
BUN: 6 mg/dL — ABNORMAL LOW (ref 8–23)
BUN: 6 mg/dL — ABNORMAL LOW (ref 8–23)
BUN: 27 mg/dL — ABNORMAL HIGH (ref 8–23)
CO2: 23 mmol/L (ref 22–32)
CO2: 26 mmol/L (ref 22–32)
CO2: 25 mmol/L (ref 22–32)
Calcium: 8 mg/dL — ABNORMAL LOW (ref 8.9–10.3)
Calcium: 8.1 mg/dL — ABNORMAL LOW (ref 8.9–10.3)
Calcium: 8.1 mg/dL — ABNORMAL LOW (ref 8.9–10.3)
Chloride: 92 mmol/L — ABNORMAL LOW (ref 98–111)
Chloride: 91 mmol/L — ABNORMAL LOW (ref 98–111)
Chloride: 109 mmol/L (ref 98–111)
Creatinine, Ser: 0.38 mg/dL — ABNORMAL LOW (ref 0.44–1.00)
Creatinine, Ser: 0.43 mg/dL — ABNORMAL LOW (ref 0.44–1.00)
Creatinine, Ser: 0.75 mg/dL (ref 0.44–1.00)
GFR, Estimated: >60 mL/min (ref >60)
GFR, Estimated: >60 mL/min (ref >60)
GFR, Estimated: >60 mL/min (ref >60)
Glucose, Bld: 104 mg/dL — ABNORMAL HIGH (ref 70–99)
Glucose, Bld: 108 mg/dL — ABNORMAL HIGH (ref 70–99)
Glucose, Bld: 130 mg/dL — ABNORMAL HIGH (ref 70–99)
Potassium: 2.8 mmol/L — ABNORMAL LOW (ref 3.5–5.1)
Potassium: 3 mmol/L — ABNORMAL LOW (ref 3.5–5.1)
Potassium: 3.4 mmol/L — ABNORMAL LOW (ref 3.5–5.1)
Sodium: 125 mmol/L — ABNORMAL LOW (ref 135–145)
Sodium: 126 mmol/L — ABNORMAL LOW (ref 135–145)
Sodium: 143 mmol/L (ref 135–145)

## 2021-08-24 LAB — SODIUM, URINE, RANDOM: Sodium, Ur: <10 mmol/L

## 2021-08-24 LAB — MAGNESIUM: Magnesium: 1.9 mg/dL (ref 1.7–2.4)

## 2021-08-24 LAB — OSMOLALITY, URINE: Osmolality, Ur: 128 mOsm/kg — ABNORMAL LOW (ref 300–900)

## 2021-08-24 LAB — URINE CULTURE: Culture: 20000 — AB

## 2021-08-24 MED ORDER — POTASSIUM CHLORIDE CRYS ER 20 MEQ PO TBCR
40.0000 meq | EXTENDED_RELEASE_TABLET | Freq: Once | ORAL | Status: AC
  Administered 2021-08-24: 40 meq via ORAL
  Filled 2021-08-24: qty 2

## 2021-08-24 NOTE — Hospital Course (Addendum)
Patient is an 81 year old female past medical history of hypertension, hyperlipidemia, CVA in November and depression who presented to the emergency room with weakness and fatigue x4 days.  In the emergency room, patient was found to have a white count of 12,000 and sodium of 116 with urine osmolality of 135 and urine sodium less than 10.  Admitted to the hospitalist service.  Patient given normal saline and repeat sodium up to 123.  Over next few days, sodium has improved and by day of discharge up to 132.

## 2021-08-24 NOTE — Assessment & Plan Note (Addendum)
Improving.  Sensitive to Lasix and dropped, but with fluids, improved again.  By day of discharge, sodium at 132.  Recommending repeat sodium check on Thursday, 2/2

## 2021-08-24 NOTE — Plan of Care (Signed)

## 2021-08-24 NOTE — Assessment & Plan Note (Signed)
Likely stress margination due to hyponatremia.  Resolved and follow-up labs without antibiotic intervention

## 2021-08-24 NOTE — Assessment & Plan Note (Signed)
Continue statin. 

## 2021-08-24 NOTE — Progress Notes (Signed)
[facility] Progress Note  Patient: Sandra Becker    [id number]  DOA: 08/23/2021    Date of Service: the patient was seen and examined on 08/24/2021  Brief hospital course: Patient is an 81 year old female past medical history of hypertension, hyperlipidemia, CVA in November and depression who presented to the emergency room with weakness and fatigue x4 days.  In the emergency room, patient was found to have a white count of 12,000 and sodium of 116 with urine osmolality of 135 and urine sodium less than 10.  Admitted to the hospitalist service.  Patient given normal saline and repeat sodium up to 123.  Saline stopped and subsequent sodium levels note continued improvement with most recent being at 126.  Assessment and Plan: * Hyponatremia-resolved as of 08/24/2021, (present on admission) Improving.  Follow-up hemoglobin at 11 AM markedly improved at 143.  Recheck labs in the morning.  Patient notes clinical improvement.  Benign essential HTN- (present on admission) Blood pressure trending back upward.  Restarting home medications.  Leukocytosis-resolved as of 08/24/2021, (present on admission) Likely stress margination due to hyponatremia.  Resolved and follow-up labs without antibiotic intervention  Hypokalemia Secondary to saline administration.  Replacing.  Checking magnesium level.  Dyslipidemia- (present on admission) Continue statin.    Body mass index is 24.07 kg/m.        Consultants: None  Procedures: None  Antimicrobials: None  Code Status: Full code   Subjective: Patient feeling better, a little stronger  Objective: Vitals reviewed, blood pressure starting to elevate with systolic in the 160F Vitals:   08/24/21 0541 08/24/21 1212  BP: 140/62 (!) 152/75  Pulse: 84 84  Resp: 19 16  Temp: 98.1 F (36.7 C) 98.3 F (36.8 C)  SpO2: 99% 99%    Intake/Output Summary (Last 24 hours) at 08/24/2021 1356 Last data filed at 08/24/2021 1300 Gross per  24 hour  Intake 483 ml  Output 400 ml  Net 83 ml   Filed Weights   08/23/21 0959 08/24/21 0500  Weight: 66.7 kg 65.6 kg   Body mass index is 24.07 kg/m.  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 Anticipate discharge 1/28.  Physical therapy to see.      Family Communication: Husband and son at the bedside DVT Prophylaxis: enoxaparin (LOVENOX) injection 40 mg Start: 08/23/21 2200    Author: Annita Brod ,MD 08/24/2021 1:56 PM  To reach On-call, see care teams to locate the attending and reach out via [email or web address]. Between 7PM-7AM, please contact night-coverage If you still have difficulty reaching the attending provider, please page the [facility] (Director on Call) for [facility] on amion for assistance.

## 2021-08-24 NOTE — Assessment & Plan Note (Signed)
Secondary to saline administration.  Replacing.  Checking magnesium level.

## 2021-08-24 NOTE — Assessment & Plan Note (Addendum)
Blood pressure trending back upward, following fluid resuscitation.  Restarted on home medications.

## 2021-08-25 DIAGNOSIS — D72828 Other elevated white blood cell count: Secondary | ICD-10-CM | POA: Diagnosis not present

## 2021-08-25 DIAGNOSIS — I10 Essential (primary) hypertension: Secondary | ICD-10-CM | POA: Diagnosis not present

## 2021-08-25 DIAGNOSIS — R531 Weakness: Secondary | ICD-10-CM | POA: Diagnosis not present

## 2021-08-25 DIAGNOSIS — E871 Hypo-osmolality and hyponatremia: Secondary | ICD-10-CM | POA: Diagnosis not present

## 2021-08-25 DIAGNOSIS — F32A Depression, unspecified: Secondary | ICD-10-CM | POA: Diagnosis not present

## 2021-08-25 LAB — BASIC METABOLIC PANEL
Anion gap: 8 (ref 5–15)
Anion gap: 9 (ref 5–15)
Anion gap: 6 (ref 5–15)
BUN: 11 mg/dL (ref 8–23)
BUN: 8 mg/dL (ref 8–23)
BUN: 11 mg/dL (ref 8–23)
CO2: 24 mmol/L (ref 22–32)
CO2: 26 mmol/L (ref 22–32)
CO2: 28 mmol/L (ref 22–32)
Calcium: 8.5 mg/dL — ABNORMAL LOW (ref 8.9–10.3)
Calcium: 8.5 mg/dL — ABNORMAL LOW (ref 8.9–10.3)
Calcium: 8.6 mg/dL — ABNORMAL LOW (ref 8.9–10.3)
Chloride: 95 mmol/L — ABNORMAL LOW (ref 98–111)
Chloride: 92 mmol/L — ABNORMAL LOW (ref 98–111)
Chloride: 94 mmol/L — ABNORMAL LOW (ref 98–111)
Creatinine, Ser: 0.42 mg/dL — ABNORMAL LOW (ref 0.44–1.00)
Creatinine, Ser: 0.41 mg/dL — ABNORMAL LOW (ref 0.44–1.00)
Creatinine, Ser: 0.5 mg/dL (ref 0.44–1.00)
GFR, Estimated: >60 mL/min (ref >60)
GFR, Estimated: >60 mL/min (ref >60)
GFR, Estimated: >60 mL/min (ref >60)
Glucose, Bld: 94 mg/dL (ref 70–99)
Glucose, Bld: 145 mg/dL — ABNORMAL HIGH (ref 70–99)
Glucose, Bld: 102 mg/dL — ABNORMAL HIGH (ref 70–99)
Potassium: 3.8 mmol/L (ref 3.5–5.1)
Potassium: 3.8 mmol/L (ref 3.5–5.1)
Potassium: 3.8 mmol/L (ref 3.5–5.1)
Sodium: 127 mmol/L — ABNORMAL LOW (ref 135–145)
Sodium: 127 mmol/L — ABNORMAL LOW (ref 135–145)
Sodium: 128 mmol/L — ABNORMAL LOW (ref 135–145)

## 2021-08-25 LAB — CBC
HCT: 39.5 % (ref 36.0–46.0)
Hemoglobin: 13.7 g/dL (ref 12.0–15.0)
MCH: 30.4 pg (ref 26.0–34.0)
MCHC: 34.7 g/dL (ref 30.0–36.0)
MCV: 87.8 fL (ref 80.0–100.0)
Platelets: 349 10*3/uL (ref 150–400)
RBC: 4.5 MIL/uL (ref 3.87–5.11)
RDW: 13.1 % (ref 11.5–15.5)
WBC: 11.6 10*3/uL — ABNORMAL HIGH (ref 4.0–10.5)
nRBC: 0 % (ref 0.0–0.2)

## 2021-08-25 MED ORDER — FUROSEMIDE 10 MG/ML IJ SOLN
20.0000 mg | Freq: Once | INTRAMUSCULAR | Status: AC
  Administered 2021-08-25: 20 mg via INTRAVENOUS
  Filled 2021-08-25: qty 2

## 2021-08-25 NOTE — Progress Notes (Addendum)
[facility] Progress Note  Patient: Sandra Becker    VQQ:[id number]  DOA: 08/23/2021    Date of Service: the patient was seen and examined on 08/25/2021  Brief hospital course: Patient is an 81 year old female past medical history of hypertension, hyperlipidemia, CVA in November and depression who presented to the emergency room with weakness and fatigue x4 days.  In the emergency room, patient was found to have a white count of 12,000 and sodium of 116 with urine osmolality of 135 and urine sodium less than 10.  Admitted to the hospitalist service.  Patient given normal saline and repeat sodium up to 123.  Saline stopped and subsequent sodium levels note continued improvement with most recent being at 126.  Patient had an afternoon sodium on 1/27 of 142, but this looks to be a lab error.  Repeat sodium this morning at 127.   Assessment and Plan: * Hyponatremia- (present on admission) Improving.  Sodium of 143 on 1/27 likely lab error.  Sodium x2 today at 127.  We will give 1 dose of Lasix and recheck..  Benign essential HTN- (present on admission) Blood pressure trending back upward.  Restarting home medications.  Leukocytosis-resolved as of 08/24/2021, (present on admission) Likely stress margination due to hyponatremia.  Resolved and follow-up labs without antibiotic intervention  Generalized weakness Physical therapy to see  Hypokalemia Secondary to saline administration.  Replacing.  Checking magnesium level.  Dyslipidemia- (present on admission) Continue statin.    Body mass index is 24.07 kg/m.        Consultants: None  Procedures: None  Antimicrobials: None  Code Status: Full code   Subjective: Patient feeling better, a little stronger  Objective: Vitals reviewed, blood pressure starting to elevate with systolic in the 433I Vitals:   08/25/21 0519 08/25/21 1202  BP: (!) 125/54 136/72  Pulse: 60 84  Resp: 18 18  Temp: 98 F (36.7 C) 97.7 F  (36.5 C)  SpO2: 100% 100%    Intake/Output Summary (Last 24 hours) at 08/25/2021 1430 Last data filed at 08/25/2021 1020 Gross per 24 hour  Intake 420 ml  Output 400 ml  Net 20 ml    Filed Weights   08/23/21 0959 08/24/21 0500  Weight: 66.7 kg 65.6 kg   Body mass index is 24.07 kg/m.  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 Anticipate discharge 1/28.  Physical therapy to see.      Family Communication: Husband and son at the bedside DVT Prophylaxis: enoxaparin (LOVENOX) injection 40 mg Start: 08/23/21 2200    Author: Annita Brod ,MD 08/25/2021 2:30 PM  To reach On-call, see care teams to locate the attending and reach out via [email or web address]. Between 7PM-7AM, please contact night-coverage If you still have difficulty reaching the attending provider, please page the [facility] (Director on Call) for [facility] on amion for assistance.

## 2021-08-25 NOTE — Plan of Care (Signed)
°  Problem: Activity: Goal: Risk for activity intolerance will decrease Outcome: Progressing   Problem: Nutrition: Goal: Adequate nutrition will be maintained Outcome: Progressing   Problem: Coping: Goal: Level of anxiety will decrease Outcome: Progressing   Problem: Pain Managment: Goal: General experience of comfort will improve Outcome: Progressing   Problem: Skin Integrity: Goal: Risk for impaired skin integrity will decrease Outcome: Progressing   

## 2021-08-25 NOTE — Evaluation (Signed)
Physical Therapy Evaluation Patient Details Name: Sandra Becker MRN: [id number] DOB: 1941-06-01 Today's Date: 08/25/2021  History of Present Illness  Patient is an 81 year old female presenting to ED with weakness and fatigue for 4 days. 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   Clinical Impression  Pt is an 81 y.o. female with above HPI resulting in the deficits listed below (see PT Problem List). Pt performed sit to stand transfers with MIN guard for safety and cues for safe hand placement. Pt ambulated total of ~84ft with MIN guard for safety. Pt with decreased activity tolerance and reporting increased fatigue following limited ambulation. Pt will have assist from her husband upon d/c and reports she has son who checks in intermittently.  Pt will benefit from skilled PT to maximize functional mobility to increase independence.         Recommendations for follow up therapy are one component of a multi-disciplinary discharge planning process, led by the attending physician.  Recommendations may be updated based on patient status, additional functional criteria and insurance authorization.  Follow Up Recommendations Home health PT    Assistance Recommended at Discharge Frequent or constant Supervision/Assistance  Patient can return home with the following  A little help with walking and/or transfers;A little help with bathing/dressing/bathroom;Assistance with cooking/housework;Assist for transportation;Help with stairs or ramp for entrance;Direct supervision/assist for medications management;Direct supervision/assist for financial management    Equipment Recommendations None recommended by PT  Recommendations for Other Services       Functional Status Assessment Patient has had a recent decline in their functional status and demonstrates the ability to make significant  improvements in function in a reasonable and predictable amount of time.     Precautions / Restrictions Precautions Precautions: Fall Restrictions Weight Bearing Restrictions: No      Mobility  Bed Mobility Overal bed mobility: Needs Assistance Bed Mobility: Supine to Sit     Supine to sit: Min guard     General bed mobility comments: HOB flat, increased time.    Transfers Overall transfer level: Needs assistance Equipment used: Rolling walker (2 wheels) Transfers: Sit to/from Stand Sit to Stand: Min assist           General transfer comment: cues for safe hand placement to power up to stand. slightly increased time in standing to steady.    Ambulation/Gait Ambulation/Gait assistance: Min guard Gait Distance (Feet): 24 Feet Assistive device: Rolling walker (2 wheels) Gait Pattern/deviations: Step-through pattern, Decreased stride length Gait velocity: decr     General Gait Details: Decreased gait speed, no overt LOB observed. Pt reporting increased fatigue following limited ambulation, intermittent groaning due to fatigue, denies pain.  Stairs            Wheelchair Mobility    Modified Rankin (Stroke Patients Only)       Balance Overall balance assessment: Needs assistance Sitting-balance support: Feet supported Sitting balance-Leahy Scale: Good     Standing balance support: Bilateral upper extremity supported, During functional activity Standing balance-Leahy Scale: Fair                               Pertinent Vitals/Pain Pain Assessment Pain Assessment: No/denies pain    Home Living Family/patient expects to be discharged to:: Private residence Living Arrangements: Spouse/significant other Available Help at Discharge: Family Type of Home: Apartment Home Access: Elevator  Home Layout: One level Home Equipment: Conservation officer, nature (2 wheels);Wheelchair - Sport and exercise psychologist Comments: Lives ILF at [facility],  endorses history of falls, cant recall when last one was. Reports she was doing PT after stroke, but is not doing at this time.    Prior Function Prior Level of Function : Needs assist       Physical Assist : Mobility (physical);ADLs (physical) Mobility (physical): Transfers;Gait ADLs (physical): Bathing;Dressing;Grooming Mobility Comments: RW at baseline. States she also uses w/c but unable to tell PT when stating "it depends" denies using for only community distances. Husband assists supervision for transfers and ambulation when needed ADLs Comments: Pt unable to recall all things husband helps with. Reports she was (I) with ADLs and mobility prior to stroke in November.     Hand Dominance   Dominant Hand: Right    Extremity/Trunk Assessment   Upper Extremity Assessment Upper Extremity Assessment: RUE deficits/detail RUE Deficits / Details: fair grip strength on R, less compared to L. Shoulder flexion 3+/5    Lower Extremity Assessment Lower Extremity Assessment: RLE deficits/detail;LLE deficits/detail RLE Deficits / Details: grossly 4/5 throughout LLE Deficits / Details: grossly 4/5 throughout    Cervical / Trunk Assessment Cervical / Trunk Assessment: Normal  Communication   Communication: Expressive difficulties  Cognition Arousal/Alertness: Awake/alert Behavior During Therapy: Flat affect Overall Cognitive Status: No family/caregiver present to determine baseline cognitive functioning                                 General Comments: Pt states that she has had difficulties with memory since stroke, she is aware that she forgets things. Unable to recall DOB and current yr. Oriented to [facility] and name. Difficulty remember some PLOF and home living environment information        General Comments      Exercises     Assessment/Plan    PT Assessment Patient needs continued PT services  PT Problem List Decreased strength;Decreased  activity tolerance;Decreased balance;Decreased mobility;Decreased knowledge of use of DME       PT Treatment Interventions DME instruction;Gait training;Stair training;Functional mobility training;Therapeutic activities;Therapeutic exercise;Balance training;Patient/family education    PT Goals (Current goals can be found in the Care Plan section)  Acute Rehab PT Goals Patient Stated Goal: Get home and feel better PT Goal Formulation: With patient Time For Goal Achievement: 09/08/21 Potential to Achieve Goals: Good    Frequency Min 3X/week     Co-evaluation               AM-PAC PT "6 Clicks" Mobility  Outcome Measure Help needed turning from your back to your side while in a flat bed without using bedrails?: A Little Help needed moving from lying on your back to sitting on the side of a flat bed without using bedrails?: A Little Help needed moving to and from a bed to a chair (including a wheelchair)?: A Little Help needed standing up from a chair using your arms (e.g., wheelchair or bedside chair)?: A Little Help needed to walk in hospital room?: A Little Help needed climbing 3-5 steps with a railing? : A Little 6 Click Score: 18    End of Session Equipment Utilized During Treatment: Gait belt Activity Tolerance: Patient tolerated treatment well Patient left: in chair;with call bell/phone within reach;with chair alarm set Nurse Communication: Mobility status PT Visit Diagnosis: Unsteadiness on feet (R26.81);Muscle weakness (generalized) (M62.81);History of falling (Z91.81)  Time: 1552-0802 PT Time Calculation (min) (ACUTE ONLY): 24 min   Charges:   PT Evaluation $PT Eval Low Complexity: 1 Low PT Treatments $Therapeutic Activity: 8-22 mins        Festus Barren PT, DPT  Acute Rehabilitation Services  Office [phone]  08/25/2021, 11:49 AM

## 2021-08-25 NOTE — Assessment & Plan Note (Addendum)
Patient already set up for skilled nursing at [facility] before coming to hospital.

## 2021-08-26 DIAGNOSIS — I10 Essential (primary) hypertension: Secondary | ICD-10-CM | POA: Diagnosis present

## 2021-08-26 DIAGNOSIS — E876 Hypokalemia: Secondary | ICD-10-CM | POA: Diagnosis present

## 2021-08-26 DIAGNOSIS — I69354 Hemiplegia and hemiparesis following cerebral infarction affecting left non-dominant side: Secondary | ICD-10-CM | POA: Diagnosis not present

## 2021-08-26 DIAGNOSIS — Z9013 Acquired absence of bilateral breasts and nipples: Secondary | ICD-10-CM | POA: Diagnosis not present

## 2021-08-26 DIAGNOSIS — Z803 Family history of malignant neoplasm of breast: Secondary | ICD-10-CM | POA: Diagnosis not present

## 2021-08-26 DIAGNOSIS — E871 Hypo-osmolality and hyponatremia: Secondary | ICD-10-CM | POA: Diagnosis present

## 2021-08-26 DIAGNOSIS — Z91013 Allergy to seafood: Secondary | ICD-10-CM | POA: Diagnosis not present

## 2021-08-26 DIAGNOSIS — Z20822 Contact with and (suspected) exposure to covid-19: Secondary | ICD-10-CM | POA: Diagnosis present

## 2021-08-26 DIAGNOSIS — F32A Depression, unspecified: Secondary | ICD-10-CM | POA: Diagnosis present

## 2021-08-26 DIAGNOSIS — Z853 Personal history of malignant neoplasm of breast: Secondary | ICD-10-CM | POA: Diagnosis not present

## 2021-08-26 DIAGNOSIS — Z87891 Personal history of nicotine dependence: Secondary | ICD-10-CM | POA: Diagnosis not present

## 2021-08-26 DIAGNOSIS — R531 Weakness: Secondary | ICD-10-CM | POA: Diagnosis not present

## 2021-08-26 DIAGNOSIS — D72828 Other elevated white blood cell count: Secondary | ICD-10-CM | POA: Diagnosis present

## 2021-08-26 DIAGNOSIS — R1310 Dysphagia, unspecified: Secondary | ICD-10-CM | POA: Diagnosis present

## 2021-08-26 DIAGNOSIS — E785 Hyperlipidemia, unspecified: Secondary | ICD-10-CM | POA: Diagnosis present

## 2021-08-26 LAB — CBC
HCT: 45.1 % (ref 36.0–46.0)
Hemoglobin: 15.8 g/dL — ABNORMAL HIGH (ref 12.0–15.0)
MCH: 30.3 pg (ref 26.0–34.0)
MCHC: 35 g/dL (ref 30.0–36.0)
MCV: 86.6 fL (ref 80.0–100.0)
Platelets: 352 10*3/uL (ref 150–400)
RBC: 5.21 MIL/uL — ABNORMAL HIGH (ref 3.87–5.11)
RDW: 12.9 % (ref 11.5–15.5)
WBC: 10.3 10*3/uL (ref 4.0–10.5)
nRBC: 0 % (ref 0.0–0.2)

## 2021-08-26 LAB — BASIC METABOLIC PANEL
Anion gap: 9 (ref 5–15)
Anion gap: 8 (ref 5–15)
BUN: 14 mg/dL (ref 8–23)
BUN: 16 mg/dL (ref 8–23)
CO2: 26 mmol/L (ref 22–32)
CO2: 27 mmol/L (ref 22–32)
Calcium: 8.5 mg/dL — ABNORMAL LOW (ref 8.9–10.3)
Calcium: 8.6 mg/dL — ABNORMAL LOW (ref 8.9–10.3)
Chloride: 89 mmol/L — ABNORMAL LOW (ref 98–111)
Chloride: 93 mmol/L — ABNORMAL LOW (ref 98–111)
Creatinine, Ser: 0.56 mg/dL (ref 0.44–1.00)
Creatinine, Ser: 0.65 mg/dL (ref 0.44–1.00)
GFR, Estimated: >60 mL/min (ref >60)
GFR, Estimated: >60 mL/min (ref >60)
Glucose, Bld: 106 mg/dL — ABNORMAL HIGH (ref 70–99)
Glucose, Bld: 117 mg/dL — ABNORMAL HIGH (ref 70–99)
Potassium: 3.8 mmol/L (ref 3.5–5.1)
Potassium: 3.7 mmol/L (ref 3.5–5.1)
Sodium: 124 mmol/L — ABNORMAL LOW (ref 135–145)
Sodium: 128 mmol/L — ABNORMAL LOW (ref 135–145)

## 2021-08-26 MED ORDER — SODIUM CHLORIDE 0.9 % IV SOLN: INTRAVENOUS | Status: AC

## 2021-08-26 NOTE — Plan of Care (Signed)

## 2021-08-26 NOTE — Progress Notes (Signed)
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  In the emergency room, patient was found to have a white count of 12,000 and sodium of 116 with urine osmolality of 135 and urine sodium less than 10.  Admitted to the hospitalist service.  Patient given normal saline and repeat sodium up to 123.  Saline stopped and subsequent sodium levels note continued improvement with most recent being at 126.  Patient had an afternoon sodium on 1/27 of 142, but this looks to be a lab error.  Sodium up to 128 x 1/28 and then received dose of Lasix.  Sodium dropped to 124 on 1/29 and started back on IV fluids and by afternoon, sodium up to 128 again.   Assessment and Plan: * Hyponatremia- (present on admission) Improving.  Sensitive to Lasix and dropped, but with eyelids, improved again.  We will continue fluids for a few more hours and stop and recheck level tomorrow morning  Benign essential HTN- (present on admission) Blood pressure trending back upward.  Restarting home medications.  Leukocytosis-resolved as of 08/24/2021, (present on admission) Likely stress margination due to hyponatremia.  Resolved and follow-up labs without antibiotic intervention  Generalized weakness Physical therapy recommending home health.  Hypokalemia Secondary to saline administration.  Replacing.  Checking magnesium level.  Dyslipidemia- (present on admission) Continue statin.    Body mass index is 23.33 kg/m.        Consultants: None  Procedures: None  Antimicrobials: None  Code Status: Full code   Subjective: Patient feeling okay, fatigued  Objective: Vitals reviewed, blood  pressure starting to elevate with systolic in the 403K Vitals:   08/26/21 0438 08/26/21 1308  BP: 128/77 (!) 143/69  Pulse: 80 85  Resp: 17 16  Temp: 97.8 F (36.6 C) 97.8 F (36.6 C)  SpO2: 99% 98%    Intake/Output Summary (Last 24 hours) at 08/26/2021 1608 Last data filed at 08/25/2021 2200 Gross per 24 hour  Intake 120 ml  Output 200 ml  Net -80 ml    Filed Weights   08/23/21 0959 08/24/21 0500 08/26/21 0434  Weight: 66.7 kg 65.6 kg 63.6 kg   Body mass index is 23.33 kg/m.  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  Last sodium at 128.  Disposition:  Status is: Inpatient  Patient is inpatient due to need for IV fluids and monitoring      Family Communication: Husband and son at the bedside DVT Prophylaxis: enoxaparin (LOVENOX) injection 40 mg Start: 08/23/21 2200    Author: Annita Brod ,MD 08/26/2021 4:08 PM  To reach On-call, see care teams to locate the attending and reach out via [email or web address]. Between 7PM-7AM, please contact night-coverage If you still have difficulty reaching the attending provider, please page the [facility] (Director on Call) for [facility] on amion for assistance.

## 2021-08-26 NOTE — Progress Notes (Signed)
PT Cancellation Note  Patient Details Name: Sandra Becker MRN: [id number] DOB: 22-Dec-1940   Cancelled Treatment:    Reason Eval/Treat Not Completed: Patient declined, no reason specified    Doreatha Massed, PT Acute Rehabilitation  Office: [phone] Pager: [phone]

## 2021-08-27 ENCOUNTER — Ambulatory Visit: Payer: Medicare Other

## 2021-08-27 ENCOUNTER — Ambulatory Visit: Payer: Medicare Other | Admitting: Occupational Therapy

## 2021-08-27 ENCOUNTER — Ambulatory Visit: Payer: Medicare Other | Admitting: Physical Therapy

## 2021-08-27 DIAGNOSIS — E871 Hypo-osmolality and hyponatremia: Secondary | ICD-10-CM | POA: Diagnosis not present

## 2021-08-27 DIAGNOSIS — R531 Weakness: Secondary | ICD-10-CM | POA: Diagnosis not present

## 2021-08-27 DIAGNOSIS — F32A Depression, unspecified: Secondary | ICD-10-CM | POA: Diagnosis not present

## 2021-08-27 DIAGNOSIS — I10 Essential (primary) hypertension: Secondary | ICD-10-CM | POA: Diagnosis not present

## 2021-08-27 LAB — BASIC METABOLIC PANEL
Anion gap: 7 (ref 5–15)
BUN: 13 mg/dL (ref 8–23)
CO2: 25 mmol/L (ref 22–32)
Calcium: 8 mg/dL — ABNORMAL LOW (ref 8.9–10.3)
Chloride: 98 mmol/L (ref 98–111)
Creatinine, Ser: 0.39 mg/dL — ABNORMAL LOW (ref 0.44–1.00)
GFR, Estimated: >60 mL/min (ref >60)
Glucose, Bld: 105 mg/dL — ABNORMAL HIGH (ref 70–99)
Potassium: 3.5 mmol/L (ref 3.5–5.1)
Sodium: 130 mmol/L — ABNORMAL LOW (ref 135–145)

## 2021-08-27 LAB — SODIUM: Sodium: 129 mmol/L — ABNORMAL LOW (ref 135–145)

## 2021-08-27 MED ORDER — ORAL CARE MOUTH RINSE
15.0000 mL | Freq: Two times a day (BID) | OROMUCOSAL | Status: DC
  Administered 2021-08-27 – 2021-08-28 (×3): 15 mL via OROMUCOSAL

## 2021-08-27 MED ORDER — SODIUM CHLORIDE 0.9 % IV SOLN: INTRAVENOUS | Status: DC

## 2021-08-27 MED ORDER — IRBESARTAN 75 MG PO TABS
75.0000 mg | ORAL_TABLET | Freq: Every day | ORAL | Status: DC
  Administered 2021-08-27 – 2021-08-28 (×2): 75 mg via ORAL
  Filled 2021-08-27 (×2): qty 1

## 2021-08-27 NOTE — Plan of Care (Signed)
°  Problem: Clinical Measurements: Goal: Diagnostic test results will improve Outcome: Progressing   Problem: Elimination: Goal: Will not experience complications related to urinary retention Outcome: Progressing

## 2021-08-27 NOTE — Progress Notes (Signed)
Pt  experiencing difficulties while swallowing and at times experienced pain. Per husband this started when she had her stroke. MD Gevena Barre notified SPL ordered.

## 2021-08-27 NOTE — Progress Notes (Signed)
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  In the emergency room, patient was found to have a white count of 12,000 and sodium of 116 with urine osmolality of 135 and urine sodium less than 10.  Admitted to the hospitalist service.  Patient given normal saline and repeat sodium up to 123.  Saline stopped and subsequent sodium levels note continued improvement with most recent being at 126.  Patient had an afternoon sodium on 1/27 of 142, but this looks to be a lab error.  Sodium up to 128 x 1/28 and then received dose of Lasix.  Sodium dropped to 124 on 1/29 and started back on IV fluids and by afternoon, sodium up to 128 again and by 1/30, up to 130.   Assessment and Plan: * Hyponatremia- (present on admission) Improving.  Sensitive to Lasix and dropped, but with eyelids, improved again.  We will continue fluids for a few more hours and stop and recheck level tomorrow morning  Benign essential HTN- (present on admission) Blood pressure trending back upward.  Restarting home medications.  Leukocytosis-resolved as of 08/24/2021, (present on admission) Likely stress margination due to hyponatremia.  Resolved and follow-up labs without antibiotic intervention  Generalized weakness Physical therapy recommending home health.  According to patient's husband, has been set up for short-term skilled nursing although case management does not have records of this.  Will call facility as well as talk to husband.  Hypokalemia Secondary to saline administration.  Replacing.  Checking magnesium level.  Dyslipidemia- (present on admission) Continue statin.    Body mass index  is 23.74 kg/m.        Consultants: None  Procedures: None  Antimicrobials: None  Code Status: Full code   Subjective: Patient feeling okay, fatigued  Objective: Vitals reviewed, blood pressure starting to elevate with systolic in the 562Z Vitals:   08/27/21 1000 08/27/21 1400  BP: (!) 156/75 (!) 153/71  Pulse:  79  Resp:  16  Temp:  98.2 F (36.8 C)  SpO2:  99%    Intake/Output Summary (Last 24 hours) at 08/27/2021 1601 Last data filed at 08/27/2021 0512 Gross per 24 hour  Intake 60 ml  Output 1150 ml  Net -1090 ml    Filed Weights   08/24/21 0500 08/26/21 0434 08/27/21 0500  Weight: 65.6 kg 63.6 kg 64.7 kg   Body mass index is 23.74 kg/m.  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  Last sodium at 129  Disposition:  Status is: Inpatient  Patient is inpatient due to need for IV fluids and monitoring      Family Communication: Husband and son at the bedside DVT Prophylaxis: enoxaparin (LOVENOX) injection 40 mg Start: 08/23/21 2200    Author: Annita Brod ,MD 08/27/2021 4:01 PM  To reach On-call, see care teams to locate the  attending and reach out via [email or web address]. Between 7PM-7AM, please contact night-coverage If you still have difficulty reaching the attending provider, please page the [facility] (Director on Call) for [facility] on amion for assistance.

## 2021-08-27 NOTE — TOC Initial Note (Signed)
Transition of Care [facility]) - Initial/Assessment Note    Patient Details  Name: Sandra Becker MRN: [id number] Date of Birth: 1940/09/07  Transition of Care [facility]) CM/SW Contact:    Leeroy Cha, RN Phone Number: 08/27/2021, 9:28 AM  Clinical Narrative:                  Transition of Care [facility]) Screening Note   Patient Details  Name: Sandra Becker Date of Birth: Dec 19, 1940   Transition of [facility]) CM/SW Contact:    Leeroy Cha, RN Phone Number: 08/27/2021, 9:28 AM    Transition of Care Department [facility]) has reviewed patient and no TOC needs have been identified at this time. We will continue to monitor patient advancement through interdisciplinary progression rounds. If new patient transition needs arise, please place a TOC consult.    Expected Discharge Plan: Home/Self Care Barriers to Discharge: Continued Medical Work up   Patient Goals and CMS Choice Patient states their goals for this hospitalization and ongoing recovery are:: to go home CMS Medicare.gov Compare Post Acute Care list provided to:: Patient    Expected Discharge Plan and Services Expected Discharge Plan: Home/Self Care   Discharge Planning Services: CM Consult   Living arrangements for the past 2 months: [place]                                      Prior Living Arrangements/Services Living arrangements for the past 2 months: [place] Lives with:: Spouse Patient language and need for interpreter reviewed:: Yes Do you feel safe going back to the place where you live?: Yes            Criminal Activity/Legal Involvement Pertinent to Current Situation/Hospitalization: No - Comment as needed  Activities of Daily Living Home Assistive Devices/Equipment: Grab bars in shower ADL Screening (condition at time of admission) Patient's cognitive ability adequate to safely complete daily activities?: Yes Is the patient deaf or have  difficulty hearing?: No Does the patient have difficulty seeing, even when wearing glasses/contacts?: No Does the patient have difficulty concentrating, remembering, or making decisions?: No Patient able to express need for assistance with ADLs?: Yes Does the patient have difficulty dressing or bathing?: Yes Independently performs ADLs?: No Communication: Independent Dressing (OT): Needs assistance Is this a change from baseline?: Pre-admission baseline Grooming: Independent Feeding: Independent Bathing: Needs assistance Is this a change from baseline?: Pre-admission baseline Toileting: Needs assistance Is this a change from baseline?: Pre-admission baseline In/Out Bed: Needs assistance Is this a change from baseline?: Pre-admission baseline Walks in Home: Needs assistance Is this a change from baseline?: Pre-admission baseline Does the patient have difficulty walking or climbing stairs?: Yes Weakness of Legs: Both Weakness of Arms/Hands: Both  Permission Sought/Granted                  Emotional Assessment Appearance:: Appears stated age     Orientation: : Oriented to Self, Oriented to Place, Oriented to  Time, Oriented to Situation Alcohol / Substance Use: Not Applicable Psych Involvement: No (comment)  Admission diagnosis:  Hyponatremia [E87.1] Generalized weakness [R53.1] Patient Active Problem List   Diagnosis Date Noted   Hypokalemia 08/24/2021   Depression 08/24/2021   Generalized weakness    CVA (cerebral vascular accident) ([place]) 07/09/2021   Stroke-like symptoms 07/08/2021   Benign essential HTN    Dyslipidemia    Prediabetes 06/27/2021  Mixed hyperlipidemia 06/27/2021   Stroke ([address]) 06/26/2021   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

## 2021-08-27 NOTE — Progress Notes (Signed)
PT Cancellation Note  Patient Details Name: Sandra Becker MRN: [id number] DOB: 06-25-1941   Cancelled Treatment:    Reason Eval/Treat Not Completed: Patient declined, no reason specified Pt refuses and when asked why, states, "I just don't want to."   [place] 08/27/2021, 1:50 PM Arlyce Dice, DPT Acute Rehabilitation Services Pager: [phone] Office: [phone]

## 2021-08-27 NOTE — Plan of Care (Signed)
Pt alert, c/o difficulty to swallow, denies any pain, no s/s of respiratory distress and will continue to monitor.   Problem: Nutrition: Goal: Adequate nutrition will be maintained Outcome: Progressing   Problem: Activity: Goal: Risk for activity intolerance will decrease Outcome: Progressing

## 2021-08-28 DIAGNOSIS — I10 Essential (primary) hypertension: Secondary | ICD-10-CM | POA: Diagnosis not present

## 2021-08-28 DIAGNOSIS — E871 Hypo-osmolality and hyponatremia: Secondary | ICD-10-CM | POA: Diagnosis not present

## 2021-08-28 DIAGNOSIS — R1310 Dysphagia, unspecified: Secondary | ICD-10-CM | POA: Diagnosis not present

## 2021-08-28 DIAGNOSIS — R531 Weakness: Secondary | ICD-10-CM | POA: Diagnosis not present

## 2021-08-28 LAB — BASIC METABOLIC PANEL
Anion gap: 8 (ref 5–15)
BUN: 19 mg/dL (ref 8–23)
CO2: 25 mmol/L (ref 22–32)
Calcium: 8.6 mg/dL — ABNORMAL LOW (ref 8.9–10.3)
Chloride: 99 mmol/L (ref 98–111)
Creatinine, Ser: 0.37 mg/dL — ABNORMAL LOW (ref 0.44–1.00)
GFR, Estimated: >60 mL/min (ref >60)
Glucose, Bld: 103 mg/dL — ABNORMAL HIGH (ref 70–99)
Potassium: 3.5 mmol/L (ref 3.5–5.1)
Sodium: 132 mmol/L — ABNORMAL LOW (ref 135–145)

## 2021-08-28 LAB — RESP PANEL BY RT-PCR (FLU A&B, COVID) ARPGX2
Influenza A by PCR: NEGATIVE
Influenza B by PCR: NEGATIVE
SARS Coronavirus 2 by RT PCR: NEGATIVE

## 2021-08-28 MED ORDER — POLYETHYLENE GLYCOL 3350 17 G PO PACK
17.0000 g | PACK | Freq: Every day | ORAL | Status: DC
  Administered 2021-08-28: 17 g via ORAL
  Filled 2021-08-28: qty 1

## 2021-08-28 MED ORDER — SENNOSIDES-DOCUSATE SODIUM 8.6-50 MG PO TABS: 1.0000 | ORAL_TABLET | Freq: Every evening | ORAL | 1 refills | Status: DC | PRN

## 2021-08-28 NOTE — Evaluation (Signed)
Clinical/Bedside Swallow Evaluation Patient Details  Name: CHAMEKA MCMULLEN MRN: [id number] Date of Birth: 02-08-1941  Today's Date: 08/28/2021 Time: SLP Start Time (ACUTE ONLY): 1435 SLP Stop Time (ACUTE ONLY): 1505 SLP Time Calculation (min) (ACUTE ONLY): 30 min  Past Medical History:  Past Medical History:  Diagnosis Date   Breast cancer ([place]) 1993   bilateral mastectomy; no chemo or radiation   Glaucoma    Hyperlipidemia    Hypertension    Past Surgical History:  Past Surgical History:  Procedure Laterality Date   MASTECTOMY Bilateral 1993   TONSILLECTOMY AND ADENOIDECTOMY  1947   TUBAL LIGATION  1974   HPI:  Patient is an 81 year old female presenting to ED with weakness and fatigue for 4 days. 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    Assessment / Plan / Recommendation  Clinical Impression  Patient demonstrated clinical s/s of dysphagia as per this bedside swallow evaluation. Her spouse provided majority of history and stated that patient has not been able to swallow even liquids without difficulty. SLP observed patient with one sip of water, resulting in immediate and explosive cough and patient grimacing in pain. She was then reluctant to try nectar thick liquids, however after SLP explained it was to determine if it would help with her swallowing. She agreed and then took a sip of nectar thickened orange and did not exhibit any coughing or throat clearing. She commented that she noticed a difference and proceeded to drink entire 4 ounce cup. No immediate or delayed coughing or throat clearing observed. SLP provided education to patient, spouse, son regarding use of thickener, provided spouse with sample packets of nectar thick drink thickener. SLP also discussed option of objective swallow study (MBS) and all in agreement for patient to continue to trial the nectar thick  liquids, follow up with SLP at next venue of care and decision can be made at later time regarding need for OP MBS. SLP Visit Diagnosis: Dysphagia, unspecified (R13.10)    Aspiration Risk  Mild aspiration risk    Diet Recommendation Nectar-thick liquid;Regular   Liquid Administration via: Cup;Straw Medication Administration: Whole meds with liquid Supervision: Patient able to self feed;Full supervision/cueing for compensatory strategies Compensations: Minimize environmental distractions;Slow rate;Small sips/bites Postural Changes: Seated upright at 90 degrees    Other  Recommendations Oral Care Recommendations: Oral care BID    Recommendations for follow up therapy are one component of a multi-disciplinary discharge planning process, led by the attending physician.  Recommendations may be updated based on patient status, additional functional criteria and insurance authorization.  Follow up Recommendations Other (comment) (ST f/u at facility (SNF versus [facility]))      Assistance Recommended at Discharge Frequent or constant Supervision/Assistance  Functional Status Assessment Patient has had a recent decline in their functional status and demonstrates the ability to make significant improvements in function in a reasonable and predictable amount of time.  Frequency and Duration   N/A         Prognosis  N/A     Swallow Study   General Date of Onset: 08/28/21 HPI: Patient is an 81 year old female presenting to ED with weakness and fatigue for 4 days. 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 Type of Study: Bedside Swallow Evaluation Previous Swallow Assessment: BSE during previous admission 2022 Diet Prior to this Study: Regular;Thin liquids  Temperature Spikes Noted: No Respiratory Status: Room air History of Recent Intubation: No Behavior/Cognition: Alert;Cooperative Oral  Cavity Assessment: Within Functional Limits Oral Care Completed by SLP: No Oral Cavity - Dentition: Adequate natural dentition Self-Feeding Abilities: Able to feed self Patient Positioning: Upright in bed Baseline Vocal Quality: Low vocal intensity Volitional Cough: Weak Volitional Swallow: Able to elicit    Oral/Motor/Sensory Function Overall Oral Motor/Sensory Function: Within functional limits   Ice Chips     Thin Liquid Thin Liquid: Impaired Presentation: Cup;Self Fed Pharyngeal  Phase Impairments: Cough - Immediate Other Comments: immediate and explosive cough with one sip of thin liquid resulting in patient to grimace in pain    Nectar Thick Nectar Thick Liquid: Within functional limits Presentation: Cup Other Comments: patient able to take sips of nectar thick juice without any overt s/s aspiration or penetration. She commented that "it does make a difference"   Honey Thick Honey Thick Liquid: Not tested   Puree Puree: Not tested   Solid     Solid: Not tested      Sonia Baller, MA, CCC-SLP 08/28/21 4:36 PM

## 2021-08-28 NOTE — Plan of Care (Signed)
  Problem: Clinical Measurements: Goal: Diagnostic test results will improve Outcome: Progressing   Problem: Activity: Goal: Risk for activity intolerance will decrease Outcome: Progressing   Problem: Nutrition: Goal: Adequate nutrition will be maintained Outcome: Progressing   Problem: Coping: Goal: Level of anxiety will decrease Outcome: Progressing   

## 2021-08-28 NOTE — Progress Notes (Signed)
AVS and discharge instructions reviewed w/ pt, husband, and son at bedside. Pt and family members verbalized understanding and had no further questions.

## 2021-08-28 NOTE — Assessment & Plan Note (Addendum)
Patient, according to her husband, having some issues with eating some foods and does better with eating chewy foods like macaroni and cheese or meat loaf.  Questionable issues with choking on liquids.  Speech therapy evaluated patient and are recommending nectar thick liquids.  Speech therapy can see pt at SNF and can do formal MBS.

## 2021-08-28 NOTE — Progress Notes (Signed)
[facility] notified w/ update on pt. And made aware that the pt. Is on the way via family members.

## 2021-08-28 NOTE — TOC Transition Note (Signed)
Transition of Care [facility]) - CM/SW Discharge Note   Patient Details  Name: Sandra Becker MRN: [id number] Date of Birth: 1941/05/19  Transition of Care [facility]) CM/SW Contact:  Leeroy Cha, RN Phone Number: 08/28/2021, 3:04 PM   Clinical Narrative:    1505-awating covid test result if neg can go to [facility] area.  Transportation is through the husband.   Final next level of care: Skilled Nursing Facility Barriers to Discharge: Barriers Resolved   Patient Goals and CMS Choice Patient states their goals for this hospitalization and ongoing recovery are:: to go home CMS Medicare.gov Compare Post Acute Care list provided to:: Patient    Discharge Placement                       Discharge Plan and Services   Discharge Planning Services: CM Consult                                 Social Determinants of Health (SDOH) Interventions     Readmission Risk Interventions No flowsheet data found.

## 2021-08-28 NOTE — Discharge Summary (Addendum)
Physician Discharge Summary   Patient: Sandra Becker MRN: [id number] DOB: 12-Nov-1940  Admit date:     08/23/2021  Discharge date: 08/28/21  Discharge Physician: Annita Brod   PCP: Kelton Pillar, MD   Recommendations at discharge:   Medication change: Celexa discontinued Follow-up: Patient will have a repeat basic metabolic panel checked on 2/2 to evaluate sodium level Pt will need to be on a low sodium diet with nectar thick liquids.  Discharge Diagnoses: Principal Problem:   Hyponatremia Active Problems:   Benign essential HTN   Depression   Dyslipidemia   CVA (cerebral vascular accident) ([place])   Hypokalemia   Generalized weakness   Dysphagia  Resolved Problems:   Leukocytosis   Hospital Course: Patient is an 81 year old female past medical history of hypertension, hyperlipidemia, CVA in November and depression who presented to the emergency room with weakness and fatigue x4 days.  In the emergency room, patient was found to have a white count of 12,000 and sodium of 116 with urine osmolality of 135 and urine sodium less than 10.  Admitted to the hospitalist service.  Patient given normal saline and repeat sodium up to 123.  Over next few days, sodium has improved and by day of discharge up to 132.  Assessment and Plan: Cardiovascular and Mediastinum Benign essential HTN Assessment & Plan Blood pressure trending back upward, following fluid resuscitation.  Restarted on home medications.  Digestive Dysphagia Assessment & Plan Patient, according to her husband, having some issues with eating some foods and does better with eating chewy foods like macaroni and cheese or meat loaf.  Questionable issues with choking on liquids.  Speech therapy evaluated patient and are recommending nectar thick liquids.  Speech therapy can see pt at SNF and can do formal MBS.  Other Generalized weakness Assessment & Plan Patient already set up for skilled nursing at [facility]  before coming to hospital.  Hypokalemia Assessment & Plan Secondary to saline administration.  Replacing.  Checking magnesium level.  Dyslipidemia Assessment & Plan Continue statin.  * Hyponatremia Assessment & Plan Improving.  Sensitive to Lasix and dropped, but with fluids, improved again.  By day of discharge, sodium at 132.  Recommending repeat sodium check on Thursday, 2/2  Leukocytosis-resolved as of 08/24/2021 Assessment & Plan Likely stress margination due to hyponatremia.  Resolved and follow-up labs without antibiotic intervention    Consultants: None Procedures performed: None  Disposition:  Independent Living or patient is from, but she is actually being discharged to skilled nursing which was set up prior to her hospitalization. Diet recommendation:  Cardiac diet with nectar think liquids.  DISCHARGE MEDICATION: Allergies as of 08/28/2021       Reactions   Crab [shellfish Allergy] Other (See Comments)   From skin test   Fosamax [alendronate Sodium] Other (See Comments)   Difficult swallowing   Hydrochlorothiazide Other (See Comments)   hyponatremia   Losartan Cough   Raloxifene Other (See Comments)   Unknown reaction   Ace Inhibitors Cough   Reaction to losartan (pt tolerates valsartan)        Medication List     STOP taking these medications    citalopram 10 MG tablet Commonly known as: CELEXA       TAKE these medications    acetaminophen 325 MG tablet Commonly known as: TYLENOL Take 2 tablets (650 mg total) by mouth every 4 (four) hours as needed for mild pain (or temp > 37.5 C (99.5 F)).   amLODipine 5 MG  tablet Commonly known as: NORVASC Take 1 tablet (5 mg total) by mouth daily.   brinzolamide 1 % ophthalmic suspension Commonly known as: AZOPT Place 1 drop into both eyes 2 (two) times daily.   clopidogrel 75 MG tablet Commonly known as: Plavix Plavix 75 mg daily beginning 07/26/2021   melatonin 3 MG Tabs tablet 1 tablet at  bedtime as needed   rosuvastatin 10 MG tablet Commonly known as: CRESTOR Take 1 tablet (10 mg total) by mouth daily. What changed: how much to take   senna-docusate 8.6-50 MG tablet Commonly known as: Senokot-S Take 1 tablet by mouth at bedtime as needed for mild constipation.   timolol 0.5 % ophthalmic solution Commonly known as: TIMOPTIC Place 1 drop into both eyes 2 (two) times daily.   valsartan 320 MG tablet Commonly known as: DIOVAN Take 1 tablet (320 mg total) by mouth daily.         Discharge Exam: Filed Weights   08/24/21 0500 08/26/21 0434 08/27/21 0500  Weight: 65.6 kg 63.6 kg 64.7 kg   General: Alert and oriented x3, no acute distress Cardiovascular: Regular rate and rhythm, S1-S2 Lungs: Clear to auscultation bilaterally  Condition at discharge: improving  The results of significant diagnostics from this hospitalization (including imaging, microbiology, ancillary and laboratory) are listed below for reference.   Imaging Studies: No results found.  Microbiology: Results for orders placed or performed during the hospital encounter of 08/23/21  Urine Culture     Status: Abnormal   Collection Time: 08/23/21 11:30 AM   Specimen: Urine, Clean Catch  Result Value Ref Range Status   Specimen Description   Final    URINE, CLEAN CATCH Performed at [facility], [address] [place], [place] [address]    Special Requests   Final    NONE Performed at [facility], [address], [place], [place] [address]    Culture (A)  Final    20,000 COLONIES/mL MULTIPLE SPECIES PRESENT, SUGGEST RECOLLECTION   Report Status 08/24/2021 FINAL  Final  Resp Panel by RT-PCR (Flu A&B, Covid) Nasopharyngeal Swab     Status: None   Collection Time: 08/23/21 11:40 AM   Specimen: Nasopharyngeal Swab; Nasopharyngeal(NP) swabs in vial transport medium  Result Value Ref Range Status   SARS Coronavirus 2 by RT PCR NEGATIVE NEGATIVE Final     Comment: (NOTE) SARS-CoV-2 target nucleic acids are NOT DETECTED.  The SARS-CoV-2 RNA is generally detectable in upper respiratory specimens during the acute phase of infection. The lowest concentration of SARS-CoV-2 viral copies this assay can detect is 138 copies/mL. A negative result does not preclude SARS-Cov-2 infection and should not be used as the sole basis for treatment or other patient management decisions. A negative result may occur with  improper specimen collection/handling, submission of specimen other than nasopharyngeal swab, presence of viral mutation(s) within the areas targeted by this assay, and inadequate number of viral copies(<138 copies/mL). A negative result must be combined with clinical observations, patient history, and epidemiological information. The expected result is Negative.  Fact Sheet for Patients:  EntrepreneurPulse.com.au  Fact Sheet for Healthcare Providers:  IncredibleEmployment.be  This test is no t yet approved or cleared by the [place] FDA and  has been authorized for detection and/or diagnosis of SARS-CoV-2 by FDA under an Emergency Use Authorization (EUA). This EUA will remain  in effect (meaning this test can be used) for the duration of the COVID-19 declaration under Section 564(b)(1) of the Act, 21 U.S.C.section 360bbb-3(b)(1),  unless the authorization is terminated  or revoked sooner.       Influenza A by PCR NEGATIVE NEGATIVE Final   Influenza B by PCR NEGATIVE NEGATIVE Final    Comment: (NOTE) The Xpert Xpress SARS-CoV-2/FLU/RSV plus assay is intended as an aid in the diagnosis of influenza from Nasopharyngeal swab specimens and should not be used as a sole basis for treatment. Nasal washings and aspirates are unacceptable for Xpert Xpress SARS-CoV-2/FLU/RSV testing.  Fact Sheet for Patients: EntrepreneurPulse.com.au  Fact Sheet for Healthcare  Providers: IncredibleEmployment.be  This test is not yet approved or cleared by the [place] FDA and has been authorized for detection and/or diagnosis of SARS-CoV-2 by FDA under an Emergency Use Authorization (EUA). This EUA will remain in effect (meaning this test can be used) for the duration of the COVID-19 declaration under Section 564(b)(1) of the Act, 21 U.S.C. section 360bbb-3(b)(1), unless the authorization is terminated or revoked.  Performed at [facility], [address] [place], [place] [address]     Labs: CBC: Recent Labs  Lab 08/23/21 1030 08/24/21 0332 08/25/21 0329 08/26/21 0328  WBC 12.2* 8.2 11.6* 10.3  NEUTROABS 9.4*  --   --   --   HGB 14.2 13.6 13.7 15.8*  HCT 38.8 37.7 39.5 45.1  MCV 81.9 85.1 87.8 86.6  PLT 423* 365 349 474   Basic Metabolic Panel: Recent Labs  Lab 08/24/21 1055 08/25/21 0329 08/25/21 1451 08/26/21 0328 08/26/21 1316 08/27/21 0327 08/27/21 1241 08/28/21 0329  NA 143   < > 128* 124* 128* 130* 129* 132*  K 3.4*   < > 3.8 3.8 3.7 3.5  --  3.5  CL 109   < > 94* 89* 93* 98  --  99  CO2 25   < > 28 26 27 25   --  25  GLUCOSE 130*   < > 102* 106* 117* 105*  --  103*  BUN 27*   < > 11 14 16 13   --  19  CREATININE 0.75   < > 0.50 0.56 0.65 0.39*  --  0.37*  CALCIUM 8.1*   < > 8.6* 8.5* 8.6* 8.0*  --  8.6*  MG 1.9  --   --   --   --   --   --   --    < > = values in this interval not displayed.   Liver Function Tests: Recent Labs  Lab 08/23/21 1030  AST 19  ALT 24  ALKPHOS 35*  BILITOT 0.7  PROT 6.8  ALBUMIN 4.3   CBG: No results for input(s): GLUCAP in the last 168 hours.  Discharge time spent: less than 30 minutes.  Signed: Annita Brod, MD [facility] 08/28/2021

## 2021-08-28 NOTE — NC FL2 (Signed)
[place] LEVEL OF CARE SCREENING TOOL     IDENTIFICATION  Patient Name: Sandra Becker Birthdate: Oct 07, 1940 Sex: female Admission Date (Current Location): 08/23/2021  [facility] and [place] Number:  Herbalist and Address:  [facility],  [place], [place]      Provider Number: [id number]  Attending Physician Name and Address:  Annita Brod, MD  Relative Name and Phone Number:       Current Level of Care: Hospital Recommended Level of Care: [place] Prior Approval Number:    Date Approved/Denied:   PASRR Number: [id number]  Discharge Plan: SNF    Current Diagnoses: Patient Active Problem List   Diagnosis Date Noted   Hypokalemia 08/24/2021   Depression 08/24/2021   Generalized weakness    CVA (cerebral vascular accident) ([place]) 07/09/2021   Stroke-like symptoms 07/08/2021   Benign essential HTN    Dyslipidemia    Prediabetes 06/27/2021   Mixed hyperlipidemia 06/27/2021   Stroke ([place]) 06/26/2021   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   Essential hypertension 06/24/2021   TIA (transient ischemic attack) 05/03/2021   Hyponatremia     Orientation RESPIRATION BLADDER Height & Weight     Self, Time, Situation, Place  Normal Continent Weight: 64.7 kg Height:  5\' 5"  (165.1 cm) (per record)  BEHAVIORAL SYMPTOMS/MOOD NEUROLOGICAL BOWEL NUTRITION STATUS      Continent Diet (regular)  AMBULATORY STATUS COMMUNICATION OF NEEDS Skin   Extensive Assist Verbally Normal                       Personal Care Assistance Level of Assistance  Bathing, Feeding, Dressing Bathing Assistance: Limited assistance Feeding assistance: Limited assistance Dressing Assistance: Limited assistance     Functional Limitations Info  Sight, Hearing, Speech Sight Info: Adequate Hearing Info: Adequate Speech Info: Adequate    SPECIAL CARE FACTORS FREQUENCY  PT (By licensed PT), OT (By  licensed OT)     PT Frequency: 5 xweekly OT Frequency: 5 x weekly            Contractures Contractures Info: Not present    Additional Factors Info  Code Status Code Status Info: full             Current Medications (08/28/2021):  This is the current hospital active medication list Current Facility-Administered Medications  Medication Dose Route Frequency Provider Last Rate Last Admin   0.9 %  sodium chloride infusion   Intravenous Continuous Annita Brod, MD 50 mL/hr at 08/28/21 0542 Infusion Verify at 08/28/21 0542   acetaminophen (TYLENOL) tablet 650 mg  650 mg Oral Q6H PRN Opyd, Ilene Qua, MD       Or   acetaminophen (TYLENOL) suppository 650 mg  650 mg Rectal Q6H PRN Opyd, Ilene Qua, MD       amLODipine (NORVASC) tablet 5 mg  5 mg Oral Daily Opyd, Ilene Qua, MD   5 mg at 08/28/21 1044   brinzolamide (AZOPT) 1 % ophthalmic suspension 1 drop  1 drop Both Eyes BID Opyd, Ilene Qua, MD   1 drop at 08/28/21 1048   clopidogrel (PLAVIX) tablet 75 mg  75 mg Oral Daily Opyd, Ilene Qua, MD   75 mg at 08/28/21 1043   enoxaparin (LOVENOX) injection 40 mg  40 mg Subcutaneous Q24H Opyd, Ilene Qua, MD   40 mg at 08/27/21 2056   irbesartan (AVAPRO) tablet 75 mg  75 mg Oral Daily [place] Pink,  Trenton Gammon, MD   75 mg at 08/28/21 1045   MEDLINE mouth rinse  15 mL Mouth Rinse BID Annita Brod, MD   15 mL at 08/28/21 1047   melatonin tablet 3 mg  3 mg Oral QHS PRN Opyd, Ilene Qua, MD   3 mg at 08/27/21 2055   ondansetron (ZOFRAN) tablet 4 mg  4 mg Oral Q6H PRN Opyd, Ilene Qua, MD       Or   ondansetron (ZOFRAN) injection 4 mg  4 mg Intravenous Q6H PRN Opyd, Ilene Qua, MD       rosuvastatin (CRESTOR) tablet 10 mg  10 mg Oral Daily Opyd, Ilene Qua, MD   10 mg at 08/28/21 1042   senna-docusate (Senokot-S) tablet 1 tablet  1 tablet Oral QHS PRN Opyd, Ilene Qua, MD       sodium chloride flush (NS) 0.9 % injection 3 mL  3 mL Intravenous Q12H Opyd, Ilene Qua, MD   3 mL at 08/27/21 1002    timolol (TIMOPTIC) 0.5 % ophthalmic solution 1 drop  1 drop Both Eyes BID Opyd, Ilene Qua, MD   1 drop at 08/28/21 1051     Discharge Medications: Please see discharge summary for a list of discharge medications.  Relevant Imaging Results:  Relevant Lab Results:   Additional Information SSN: [id number]  Leeroy Cha, RN

## 2021-08-29 ENCOUNTER — Encounter: Payer: Medicare Other | Admitting: Occupational Therapy

## 2021-08-29 ENCOUNTER — Ambulatory Visit: Payer: Medicare Other | Admitting: Physical Therapy

## 2021-08-29 DIAGNOSIS — I69391 Dysphagia following cerebral infarction: Secondary | ICD-10-CM | POA: Diagnosis not present

## 2021-08-29 DIAGNOSIS — E871 Hypo-osmolality and hyponatremia: Secondary | ICD-10-CM | POA: Diagnosis not present

## 2021-08-29 DIAGNOSIS — R41841 Cognitive communication deficit: Secondary | ICD-10-CM | POA: Diagnosis not present

## 2021-08-29 DIAGNOSIS — R1312 Dysphagia, oropharyngeal phase: Secondary | ICD-10-CM | POA: Diagnosis not present

## 2021-08-29 DIAGNOSIS — I69320 Aphasia following cerebral infarction: Secondary | ICD-10-CM | POA: Diagnosis not present

## 2021-08-29 DIAGNOSIS — R278 Other lack of coordination: Secondary | ICD-10-CM | POA: Diagnosis not present

## 2021-08-29 DIAGNOSIS — R2689 Other abnormalities of gait and mobility: Secondary | ICD-10-CM | POA: Diagnosis not present

## 2021-08-29 DIAGNOSIS — G458 Other transient cerebral ischemic attacks and related syndromes: Secondary | ICD-10-CM | POA: Diagnosis not present

## 2021-08-29 DIAGNOSIS — R2681 Unsteadiness on feet: Secondary | ICD-10-CM | POA: Diagnosis not present

## 2021-08-30 ENCOUNTER — Non-Acute Institutional Stay (SKILLED_NURSING_FACILITY): Payer: Medicare Other | Admitting: Adult Health

## 2021-08-30 ENCOUNTER — Encounter: Payer: Self-pay | Admitting: Occupational Therapy

## 2021-08-30 ENCOUNTER — Encounter: Payer: Medicare Other | Admitting: Physical Medicine & Rehabilitation

## 2021-08-30 ENCOUNTER — Encounter: Payer: Self-pay | Admitting: Adult Health

## 2021-08-30 DIAGNOSIS — E871 Hypo-osmolality and hyponatremia: Secondary | ICD-10-CM | POA: Diagnosis not present

## 2021-08-30 DIAGNOSIS — R350 Frequency of micturition: Secondary | ICD-10-CM | POA: Diagnosis not present

## 2021-08-30 DIAGNOSIS — R2689 Other abnormalities of gait and mobility: Secondary | ICD-10-CM | POA: Diagnosis not present

## 2021-08-30 DIAGNOSIS — R4701 Aphasia: Secondary | ICD-10-CM

## 2021-08-30 DIAGNOSIS — E782 Mixed hyperlipidemia: Secondary | ICD-10-CM | POA: Diagnosis not present

## 2021-08-30 DIAGNOSIS — I69320 Aphasia following cerebral infarction: Secondary | ICD-10-CM | POA: Diagnosis not present

## 2021-08-30 DIAGNOSIS — I10 Essential (primary) hypertension: Secondary | ICD-10-CM | POA: Diagnosis not present

## 2021-08-30 DIAGNOSIS — R1312 Dysphagia, oropharyngeal phase: Secondary | ICD-10-CM

## 2021-08-30 DIAGNOSIS — R41841 Cognitive communication deficit: Secondary | ICD-10-CM | POA: Diagnosis not present

## 2021-08-30 DIAGNOSIS — I69391 Dysphagia following cerebral infarction: Secondary | ICD-10-CM | POA: Diagnosis not present

## 2021-08-30 DIAGNOSIS — R278 Other lack of coordination: Secondary | ICD-10-CM | POA: Diagnosis not present

## 2021-08-30 DIAGNOSIS — R2681 Unsteadiness on feet: Secondary | ICD-10-CM | POA: Diagnosis not present

## 2021-08-30 DIAGNOSIS — I639 Cerebral infarction, unspecified: Secondary | ICD-10-CM

## 2021-08-30 DIAGNOSIS — G458 Other transient cerebral ischemic attacks and related syndromes: Secondary | ICD-10-CM | POA: Diagnosis not present

## 2021-08-30 LAB — COMPREHENSIVE METABOLIC PANEL: Calcium: 9.1 (ref 8.7–10.7)

## 2021-08-30 LAB — BASIC METABOLIC PANEL
BUN: 14 (ref 5–18)
CO2: 26 — AB (ref 13–22)
Chloride: 97 — AB (ref 99–108)
Creatinine: 0.4 — AB (ref 0.5–1.1)
Glucose: 120
Potassium: 4.6 (ref 3.4–5.3)
Sodium: 135 — AB (ref 137–147)

## 2021-08-30 NOTE — Therapy (Signed)
[facility] [address], [address] [place], [place], [address] Phone: [phone]   Fax:  [phone]  Patient Details  Name: Sandra Becker MRN: [id number] Date of Birth: 05/20/1941 Referring Provider:  No ref. provider found  Encounter Date: 08/30/2021  OCCUPATIONAL THERAPY DISCHARGE SUMMARY  Visits from Start of Care: 7  Current functional level related to goals / functional outcomes: Pt is being discharged from OT services due to change in medical status.  Pt has been hospitalized with hyponatremia since last visit and then discharged to SNF at [facility].  Pt is to receive OT/PT/SLP services while at [facility] SN[facility].     Remaining deficits: R ataxia, decreased coordination/strength, decreased balance.     Education / Equipment: Pt has been provided education on balance reactions as well as provided with HEP for FMC and strengthening.     Patient agrees to discharge. Patient goals were not met. Patient is being discharged due to a change in medical status and receiving therapy services at SNF.     , , OT 08/30/2021, 3:18 PM  [facility] [address], [address] [place], [place], [address] Phone: [phone]   Fax:  [phone] 

## 2021-08-30 NOTE — Progress Notes (Signed)
Location:   [facility] Room Number: [place] of Service:  SNF ([phone]) Provider:  Royal Hawthorn, NP   Kelton Pillar, MD  Patient Care Team: Kelton Pillar, MD as PCP - General (Family Medicine) Jerline Pain, MD as PCP - Cardiology (Cardiology)  Extended Emergency Contact Information Primary Emergency Contact: Kadrmas,Maurice Address: [address]          [place], [place] [address] Johnnette Litter of [place] Phone: [phone] Mobile Phone: [phone] Relation: Spouse Secondary Emergency Contact: Biondo,Adam Mobile Phone: [phone] Relation: Son  Code Status:  Full Code Goals of care: Advanced Directive information Advanced Directives 08/23/2021  Does Patient Have a Medical Advance Directive? No  Type of Advance Directive -  Does patient want to make changes to medical advance directive? -  Copy of [place] in Chart? -  Would patient like information on creating a medical advance directive? No - Patient declined     Chief Complaint  Patient presents with   [facility] f/u     HPI:  Pt is a 81 y.o. female seen today for an acute visit for follow up s/p hospitalization  08/23/21-08/28/21. PMH significant of CVA, hypertension, hyperlipidemia, prediabetes, glaucoma, breast cancer status post bilateral mastectomyShe came to the ED with fatigue. NA was 116, urine osmo 135 and urine sodium <10.  She was given NS. Sodium improved to 132 at discharged. Celexa was discontinued which was started in Dec for depression. During her stay she was evaluated by ST for dysphagia, NTL were recommended and outpt ST. MBS has not been obtained thus far.  She was also admitted on 11/27 with slurred speech and right sided weakness and found to have a left basal ganglia infarct.  Pt was discharged with aspirin and Brillinta for 30 days then plavix. Then she was readmitted on 12/11 with abrupt worsening of  weakness and aphasia with no clear progression in CVA. She did have chronic hyponatremia and was changed from chlorthalidone to norvasc during the 12/11 hospitalization. Her husband is at the bedside and feels that since the stroke she has not made much progress. They would like to work with therapy and return him but it seems she may need more assistance and need to change her level of care.  She has not acute complaints today for the visit.   MMSE 18/30 08/29/21  Husband reports she has a hx of low back pain with herniated disc and hip pain but she denies any pain at this time.  Past Medical History:  Diagnosis Date   Breast cancer ([place]) 1993   bilateral mastectomy; no chemo or radiation   Glaucoma    Hyperlipidemia    Hypertension    Past Surgical History:  Procedure Laterality Date   MASTECTOMY Bilateral 1993   TONSILLECTOMY AND ADENOIDECTOMY  1947   TUBAL LIGATION  1974    Allergies  Allergen Reactions   Crab [Shellfish Allergy] Other (See Comments)    From skin test   Fosamax [Alendronate Sodium] Other (See Comments)    Difficult swallowing   Hydrochlorothiazide Other (See Comments)    hyponatremia   Losartan Cough   Raloxifene Other (See Comments)    Unknown reaction   Ace Inhibitors Cough    Reaction to losartan (pt tolerates valsartan)    Allergies as of 08/30/2021       Reactions   Crab [shellfish Allergy] Other (See Comments)   From skin test  Fosamax [alendronate Sodium] Other (See Comments)   Difficult swallowing   Hydrochlorothiazide Other (See Comments)   hyponatremia   Losartan Cough   Raloxifene Other (See Comments)   Unknown reaction   Ace Inhibitors Cough   Reaction to losartan (pt tolerates valsartan)        Medication List        Accurate as of August 30, 2021 11:26 AM. If you have any questions, ask your nurse or doctor.          acetaminophen 325 MG tablet Commonly known as: TYLENOL Take 2 tablets (650 mg total) by mouth every 4  (four) hours as needed for mild pain (or temp > 37.5 C (99.5 F)).   amLODipine 5 MG tablet Commonly known as: NORVASC Take 1 tablet (5 mg total) by mouth daily.   brinzolamide 1 % ophthalmic suspension Commonly known as: AZOPT Place 1 drop into both eyes 2 (two) times daily.   clopidogrel 75 MG tablet Commonly known as: Plavix Plavix 75 mg daily beginning 07/26/2021   melatonin 3 MG Tabs tablet 1 tablet at bedtime as needed   polyethylene glycol 17 g packet Commonly known as: MIRALAX / GLYCOLAX Take 17 g by mouth daily.   rosuvastatin 10 MG tablet Commonly known as: CRESTOR Take 1 tablet (10 mg total) by mouth daily.   senna-docusate 8.6-50 MG tablet Commonly known as: Senokot-S Take 1 tablet by mouth at bedtime as needed for mild constipation.   timolol 0.5 % ophthalmic solution Commonly known as: TIMOPTIC Place 1 drop into both eyes 2 (two) times daily.   valsartan 320 MG tablet Commonly known as: DIOVAN Take 1 tablet (320 mg total) by mouth daily.        Review of Systems  Constitutional:  Positive for activity change. Negative for appetite change, chills, diaphoresis, fatigue, fever and unexpected weight change.  HENT:  Positive for trouble swallowing. Negative for congestion.   Eyes:  Negative for visual disturbance.  Respiratory:  Negative for cough, shortness of breath and wheezing.   Cardiovascular:  Negative for chest pain, palpitations and leg swelling.  Gastrointestinal:  Negative for abdominal distention, abdominal pain, constipation and diarrhea.  Genitourinary:  Negative for difficulty urinating and dysuria.  Musculoskeletal:  Positive for gait problem. Negative for arthralgias, back pain, joint swelling and myalgias.       Hx of low back pain and hip pain  Neurological:  Positive for speech difficulty and weakness. Negative for dizziness, tremors, seizures, syncope, facial asymmetry, light-headedness, numbness and headaches.  Psychiatric/Behavioral:   Negative for agitation, behavioral problems, confusion and sleep disturbance.    Immunization History  Administered Date(s) Administered   H1N1 08/23/2008   Influenza Split 05/17/2008, 06/02/2009, 04/19/2011   Influenza,inj,quad, With Preservative 04/06/2014   Influenza-Unspecified 05/14/2012, 04/05/2013, 04/13/2015, 05/09/2016, 05/15/2017, 06/30/2018, 04/10/2019, 06/03/2020, 04/11/2021   Moderna Covid-19 Vaccine Bivalent Booster 33yrs & up 05/11/2021   Moderna SARS-COV2 Booster Vaccination 01/16/2021   Moderna Sars-Covid-2 Vaccination 08/10/2019, 09/07/2019, 06/13/2020, 01/16/2021   Pneumococcal Conjugate-13 04/06/2014   Pneumococcal Polysaccharide-23 08/20/2007   Td 11/27/1998, 09/25/2020   Tdap 08/20/2007   Zoster, Live 02/26/2006, 11/14/2016, 05/22/2017   Pertinent  Health Maintenance Due  Topic Date Due   INFLUENZA VACCINE  Completed   DEXA SCAN  Completed   Fall Risk 08/26/2021 08/27/2021 08/27/2021 08/28/2021 08/28/2021  Falls in the past year? - - - - -  Patient Fall Risk Level High fall risk High fall risk High fall risk High fall risk High fall risk  Functional Status Survey:    Vitals:   08/30/21 1113  BP: (!) 162/73  Pulse: 82  Resp: 18  Temp: 97.7 F (36.5 C)  SpO2: 100%  Weight: 140 lb 3.2 oz (63.6 kg)  Height: 5\' 5"  (1.651 m)   Body mass index is 23.33 kg/m. Physical Exam Vitals and nursing note reviewed.  Constitutional:      General: She is not in acute distress.    Appearance: She is not diaphoretic.  HENT:     Head: Normocephalic and atraumatic.     Nose: Nose normal.     Mouth/Throat:     Mouth: Mucous membranes are moist.     Pharynx: Oropharynx is clear.  Neck:     Vascular: No JVD.  Cardiovascular:     Rate and Rhythm: Normal rate and regular rhythm.     Heart sounds: No murmur heard. Pulmonary:     Effort: Pulmonary effort is normal. No respiratory distress.     Breath sounds: Normal breath sounds. No wheezing.  Abdominal:     General:  Abdomen is flat. Bowel sounds are normal.     Palpations: Abdomen is soft.  Musculoskeletal:        General: No swelling, tenderness, deformity or signs of injury.     Cervical back: No rigidity or tenderness.     Right lower leg: No edema.     Left lower leg: No edema.  Lymphadenopathy:     Cervical: No cervical adenopathy.  Skin:    General: Skin is warm and dry.  Neurological:     Mental Status: She is alert and oriented to person, place, and time.     Comments: Difficulty answering questions for orientation due to aphasia. Has right sided droop and right sided weakness.   Psychiatric:        Mood and Affect: Mood normal.    Labs reviewed: Recent Labs    06/26/21 0416 06/27/21 0056 06/28/21 0131 06/29/21 0629 08/24/21 1055 08/25/21 0329 08/26/21 1316 08/27/21 0327 08/27/21 1241 08/28/21 0329  NA 131* 129* 129*   < > 143   < > 128* 130* 129* 132*  K 3.6 3.4* 3.6   < > 3.4*   < > 3.7 3.5  --  3.5  CL 99 97* 97*   < > 109   < > 93* 98  --  99  CO2 25 24 25    < > 25   < > 27 25  --  25  GLUCOSE 113* 118* 114*   < > 130*   < > 117* 105*  --  103*  BUN 11 8 15    < > 27*   < > 16 13  --  19  CREATININE 0.53 0.53 0.61   < > 0.75   < > 0.65 0.39*  --  0.37*  CALCIUM 8.9 8.6* 8.9   < > 8.1*   < > 8.6* 8.0*  --  8.6*  MG 1.8 2.0 2.1  --  1.9  --   --   --   --   --   PHOS 3.0 2.5 3.4  --   --   --   --   --   --   --    < > = values in this interval not displayed.   Recent Labs    06/29/21 0629 07/08/21 1815 08/23/21 1030  AST 21 25 19   ALT 31 32 24  ALKPHOS 41 48 35*  BILITOT 0.7  0.8 0.7  PROT 6.2* 6.7 6.8  ALBUMIN 3.5 3.7 4.3   Recent Labs    06/29/21 0629 07/08/21 1815 07/08/21 1821 08/23/21 1030 08/24/21 0332 08/25/21 0329 08/26/21 0328  WBC 8.9 10.9*   < > 12.2* 8.2 11.6* 10.3  NEUTROABS 5.6 7.7  --  9.4*  --   --   --   HGB 13.2 14.2   < > 14.2 13.6 13.7 15.8*  HCT 39.8 42.4   < > 38.8 37.7 39.5 45.1  MCV 88.8 90.8   < > 81.9 85.1 87.8 86.6  PLT 360  414*   < > 423* 365 349 352   < > = values in this interval not displayed.   Lab Results  Component Value Date   TSH 1.117 06/25/2021   Lab Results  Component Value Date   HGBA1C 5.7 (H) 07/09/2021   Lab Results  Component Value Date   CHOL 120 07/09/2021   HDL 65 07/09/2021   LDLCALC 39 07/09/2021   TRIG 79 07/09/2021   CHOLHDL 1.8 07/09/2021    Significant Diagnostic Results in last 30 days:  No results found.  Assessment/Plan  1. Hyponatremia Off chlorthalidone and celexa NA 135 Recheck Monday 2/6  2. 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 Aphasia Noted on exam ST ordered  4. Oropharyngeal dysphagia On NTL at this time tolerating well Working with [place]  5. Essential hypertension BP slightly elevated Continue to monitor, could increase Norvasc  6. Mixed hyperlipidemia At goal Continue statin  7. Urinary frequency Resident also having frequency. The nurse reports this is a long term symptom. UA neg 1/26. Will order PVR with bladder scan  Family/ staff Communication: discussed with husband at the bedside  Labs/tests ordered:   BMP today, repeat Monday 2/6

## 2021-08-31 ENCOUNTER — Telehealth: Payer: Self-pay

## 2021-08-31 DIAGNOSIS — R278 Other lack of coordination: Secondary | ICD-10-CM | POA: Diagnosis not present

## 2021-08-31 DIAGNOSIS — I69320 Aphasia following cerebral infarction: Secondary | ICD-10-CM | POA: Diagnosis not present

## 2021-08-31 DIAGNOSIS — R2689 Other abnormalities of gait and mobility: Secondary | ICD-10-CM | POA: Diagnosis not present

## 2021-08-31 DIAGNOSIS — G458 Other transient cerebral ischemic attacks and related syndromes: Secondary | ICD-10-CM | POA: Diagnosis not present

## 2021-08-31 DIAGNOSIS — E871 Hypo-osmolality and hyponatremia: Secondary | ICD-10-CM | POA: Diagnosis not present

## 2021-08-31 DIAGNOSIS — I69391 Dysphagia following cerebral infarction: Secondary | ICD-10-CM | POA: Diagnosis not present

## 2021-08-31 DIAGNOSIS — R2681 Unsteadiness on feet: Secondary | ICD-10-CM | POA: Diagnosis not present

## 2021-08-31 DIAGNOSIS — R41841 Cognitive communication deficit: Secondary | ICD-10-CM | POA: Diagnosis not present

## 2021-08-31 DIAGNOSIS — M6389 Disorders of muscle in diseases classified elsewhere, multiple sites: Secondary | ICD-10-CM | POA: Diagnosis not present

## 2021-08-31 DIAGNOSIS — R1312 Dysphagia, oropharyngeal phase: Secondary | ICD-10-CM | POA: Diagnosis not present

## 2021-08-31 NOTE — Telephone Encounter (Signed)
Patient's husband called and stated someone called from the clinic. He stated that she is not going to be seeing [place] for the time being due to her being in [employer]

## 2021-09-01 DIAGNOSIS — R3589 Other polyuria: Secondary | ICD-10-CM | POA: Diagnosis not present

## 2021-09-01 DIAGNOSIS — N390 Urinary tract infection, site not specified: Secondary | ICD-10-CM | POA: Diagnosis not present

## 2021-09-02 ENCOUNTER — Telehealth: Payer: Self-pay | Admitting: Family

## 2021-09-02 DIAGNOSIS — I639 Cerebral infarction, unspecified: Secondary | ICD-10-CM | POA: Diagnosis not present

## 2021-09-02 NOTE — Telephone Encounter (Signed)
Facility Nurse called on call provider states patient not sleeping at night and agitated calling out frequently.Has been re-oriented and walked with staff several times on the hall way but still agitated.Request medication to calm her down.Ativan 0.25 mg tablet every 8 hrs as needed for agitation ordered. Urine culture indicates > 100,000 colonies of gram negative bacteria no sensitivity. Start on Cipro 500 mg tablet twice daily x 7 days along with Probiotic Florastor 250 mg capsule twice daily x 10 days.Please follow up in Am.

## 2021-09-03 ENCOUNTER — Encounter: Payer: Medicare Other | Admitting: Occupational Therapy

## 2021-09-03 ENCOUNTER — Ambulatory Visit: Payer: Medicare Other | Admitting: Physical Therapy

## 2021-09-03 ENCOUNTER — Encounter: Payer: Self-pay | Admitting: Internal Medicine

## 2021-09-03 ENCOUNTER — Non-Acute Institutional Stay (SKILLED_NURSING_FACILITY): Payer: Medicare Other | Admitting: Internal Medicine

## 2021-09-03 DIAGNOSIS — R1312 Dysphagia, oropharyngeal phase: Secondary | ICD-10-CM | POA: Diagnosis not present

## 2021-09-03 DIAGNOSIS — R41841 Cognitive communication deficit: Secondary | ICD-10-CM | POA: Diagnosis not present

## 2021-09-03 DIAGNOSIS — I639 Cerebral infarction, unspecified: Secondary | ICD-10-CM

## 2021-09-03 DIAGNOSIS — R2689 Other abnormalities of gait and mobility: Secondary | ICD-10-CM | POA: Diagnosis not present

## 2021-09-03 DIAGNOSIS — I10 Essential (primary) hypertension: Secondary | ICD-10-CM | POA: Diagnosis not present

## 2021-09-03 DIAGNOSIS — E871 Hypo-osmolality and hyponatremia: Secondary | ICD-10-CM

## 2021-09-03 DIAGNOSIS — N3000 Acute cystitis without hematuria: Secondary | ICD-10-CM

## 2021-09-03 DIAGNOSIS — M6389 Disorders of muscle in diseases classified elsewhere, multiple sites: Secondary | ICD-10-CM | POA: Diagnosis not present

## 2021-09-03 DIAGNOSIS — R4701 Aphasia: Secondary | ICD-10-CM | POA: Diagnosis not present

## 2021-09-03 DIAGNOSIS — E782 Mixed hyperlipidemia: Secondary | ICD-10-CM | POA: Diagnosis not present

## 2021-09-03 DIAGNOSIS — F32A Depression, unspecified: Secondary | ICD-10-CM

## 2021-09-03 DIAGNOSIS — I69320 Aphasia following cerebral infarction: Secondary | ICD-10-CM | POA: Diagnosis not present

## 2021-09-03 DIAGNOSIS — R2681 Unsteadiness on feet: Secondary | ICD-10-CM | POA: Diagnosis not present

## 2021-09-03 DIAGNOSIS — F419 Anxiety disorder, unspecified: Secondary | ICD-10-CM

## 2021-09-03 DIAGNOSIS — R278 Other lack of coordination: Secondary | ICD-10-CM | POA: Diagnosis not present

## 2021-09-03 DIAGNOSIS — I69391 Dysphagia following cerebral infarction: Secondary | ICD-10-CM | POA: Diagnosis not present

## 2021-09-03 DIAGNOSIS — G458 Other transient cerebral ischemic attacks and related syndromes: Secondary | ICD-10-CM | POA: Diagnosis not present

## 2021-09-03 LAB — COMPREHENSIVE METABOLIC PANEL: Calcium: 9.1 (ref 8.7–10.7)

## 2021-09-03 LAB — BASIC METABOLIC PANEL
Creatinine: 0.4 — AB (ref 0.5–1.1)
Glucose: 111

## 2021-09-03 NOTE — Progress Notes (Signed)
Provider:  Veleta Miners MD Location:    [place] Room Number: 315 Place of Service:  SNF ([phone])  PCP: Kelton Pillar, MD Patient Care Team: Kelton Pillar, MD as PCP - General (Family Medicine) Jerline Pain, MD as PCP - Cardiology (Cardiology)  Extended Emergency Contact Information Primary Emergency Contact: Downard,Maurice Address: [address]          [place], [place] [address] Johnnette Litter of [place] Phone: [phone] Mobile Phone: [phone] Relation: Spouse Secondary Emergency Contact: Selner,Adam Mobile Phone: [phone] Relation: Son  Code Status: Full Code Goals of Care: Advanced Directive information Advanced Directives 09/03/2021  Does Patient Have a Medical Advance Directive? No  Type of Advance Directive -  Does patient want to make changes to medical advance directive? -  Copy of [place] in Chart? -  Would patient like information on creating a medical advance directive? No - Patient declined      Chief Complaint  Patient presents with   New Admit To SNF    Admission to SNF    HPI: Patient is a 81 y.o. female seen today for admission to SNF for hyponatremia  Admitted in the hospital from 1/26 to 1/31 for hyponatremia  Patient has a history of CVA in 11/27 MRI revealed left basal ganglia infarct Had residual Aphasia and Right sided weakness Was treated with Brilinta for 30 days with Aspirin And then Plavix in 12/28 Cardiology did Monitoring for 1 week .Patient could not finish all 3 weeks  Initially noticed to have Mild Hyponatremia Chlorthalidone was  discontinued But then she was started on Celexa in Dec Husband noticed she was feeling weak tired could not dress herself few days before going to the hospital Sleeping all the time Was taken to ED  BMP showed Sodium of 116 Urine Sodium less then 10 Urine Osmolarity 135 She was hydrated Sodium now 132 Celexa was  stopped. Thought to be cause of Hyponatremia Dyphagia Noticed now Chokes on Thins  Now on Nectar thick Continues to with Aphasia and Cognition issues Also this weekend very anxious Ativan helped. Urinary frequency  Urine Positive for infection.  started on Cipro for UTI  Past Medical History:  Diagnosis Date   Breast cancer ([place]) 1993   bilateral mastectomy; no chemo or radiation   Glaucoma    Hyperlipidemia    Hypertension    Past Surgical History:  Procedure Laterality Date   MASTECTOMY Bilateral [place]    reports that she quit smoking about 58 years ago. Her smoking use included cigarettes. She has a 5.00 pack-year smoking history. She has never used smokeless tobacco. She reports that she does not currently use alcohol after a past usage of about 21.0 standard drinks per week. She reports that she does not use drugs. Social History   Socioeconomic History   Marital status: Married    Spouse name: Not on file   Number of children: 2   Years of education: Not on file   Highest education level: Not on file  Occupational History   Not on file  Tobacco Use   Smoking status: Former    Packs/day: 1.00    Years: 5.00    Pack years: 5.00    Types: Cigarettes    Quit date: 07/30/1963    Years since quitting: 58.1   Smokeless tobacco: Never  Vaping Use   Vaping Use: Never  used  Substance and Sexual Activity   Alcohol use: Not Currently    Alcohol/week: 21.0 standard drinks    Types: 21 Glasses of wine per week    Comment: occ   Drug use: No   Sexual activity: Not Currently  Other Topics Concern   Not on file  Social History Narrative   Not on file   Social Determinants of Health   Financial Resource Strain: Not on file  Food Insecurity: Not on file  Transportation Needs: Not on file  Physical Activity: Not on file  Stress: Not on file  Social Connections: Not on file  Intimate Partner Violence: Not on  file    Functional Status Survey:    Family History  Problem Relation Age of Onset   Breast cancer Mother    Colon cancer Neg Hx     Health Maintenance  Topic Date Due   Zoster Vaccines- Shingrix (1 of 2) Never done   TETANUS/TDAP  09/25/2030   Pneumonia Vaccine 30+ Years old  Completed   INFLUENZA VACCINE  Completed   DEXA SCAN  Completed   COVID-19 Vaccine  Completed   HPV VACCINES  Aged Out    Allergies  Allergen Reactions   Crab [Shellfish Allergy] Other (See Comments)    From skin test   Fosamax [Alendronate Sodium] Other (See Comments)    Difficult swallowing   Hydrochlorothiazide Other (See Comments)    hyponatremia   Losartan Cough   Raloxifene Other (See Comments)    Unknown reaction   Ace Inhibitors Cough    Reaction to losartan (pt tolerates valsartan)    Allergies as of 09/03/2021       Reactions   Crab [shellfish Allergy] Other (See Comments)   From skin test   Fosamax [alendronate Sodium] Other (See Comments)   Difficult swallowing   Hydrochlorothiazide Other (See Comments)   hyponatremia   Losartan Cough   Raloxifene Other (See Comments)   Unknown reaction   Ace Inhibitors Cough   Reaction to losartan (pt tolerates valsartan)        Medication List        Accurate as of September 03, 2021 10:16 AM. If you have any questions, ask your nurse or doctor.          acetaminophen 325 MG tablet Commonly known as: TYLENOL Take 2 tablets (650 mg total) by mouth every 4 (four) hours as needed for mild pain (or temp > 37.5 C (99.5 F)).   amLODipine 5 MG tablet Commonly known as: NORVASC Take 1 tablet (5 mg total) by mouth daily.   brinzolamide 1 % ophthalmic suspension Commonly known as: AZOPT Place 1 drop into both eyes 2 (two) times daily.   ciprofloxacin 500 MG tablet Commonly known as: CIPRO Take 500 mg by mouth 2 (two) times daily.   clopidogrel 75 MG tablet Commonly known as: Plavix Plavix 75 mg daily beginning 07/26/2021    LORazepam 0.5 MG tablet Commonly known as: ATIVAN Take 0.25 mg by mouth every 8 (eight) hours.   melatonin 3 MG Tabs tablet 1 tablet at bedtime as needed   polyethylene glycol 17 g packet Commonly known as: MIRALAX / GLYCOLAX Take 17 g by mouth daily.   rosuvastatin 10 MG tablet Commonly known as: CRESTOR Take 1 tablet (10 mg total) by mouth daily.   saccharomyces boulardii 250 MG capsule Commonly known as: FLORASTOR Take 250 mg by mouth 2 (two) times daily.   senna-docusate 8.6-50 MG tablet Commonly known as:  Senokot-S Take 1 tablet by mouth at bedtime as needed for mild constipation.   timolol 0.5 % ophthalmic solution Commonly known as: TIMOPTIC Place 1 drop into both eyes 2 (two) times daily.   valsartan 320 MG tablet Commonly known as: DIOVAN Take 1 tablet (320 mg total) by mouth daily.        Review of Systems  Constitutional:  Negative for activity change and appetite change.  HENT: Negative.    Respiratory:  Negative for cough and shortness of breath.   Cardiovascular:  Negative for leg swelling.  Gastrointestinal:  Negative for constipation.  Genitourinary: Negative.   Musculoskeletal:  Positive for gait problem. Negative for arthralgias and myalgias.  Skin: Negative.   Neurological:  Negative for dizziness and weakness.  Psychiatric/Behavioral:  Positive for confusion. Negative for dysphoric mood and sleep disturbance. The patient is nervous/anxious.    Vitals:   09/03/21 1000  BP: (!) 146/83  Pulse: 75  Resp: 20  Temp: 97.6 F (36.4 C)  SpO2: 95%  Weight: 140 lb 3.2 oz (63.6 kg)  Height: 5\' 5"  (1.651 m)   Body mass index is 23.33 kg/m. Physical Exam Vitals reviewed.  Constitutional:      Appearance: Normal appearance.  HENT:     Head: Normocephalic.     Nose: Nose normal.     Mouth/Throat:     Mouth: Mucous membranes are moist.     Pharynx: Oropharynx is clear.  Eyes:     Pupils: Pupils are equal, round, and reactive to light.   Cardiovascular:     Rate and Rhythm: Normal rate and regular rhythm.     Pulses: Normal pulses.     Heart sounds: Normal heart sounds. No murmur heard. Pulmonary:     Effort: Pulmonary effort is normal.     Breath sounds: Normal breath sounds.  Abdominal:     General: Abdomen is flat. Bowel sounds are normal.     Palpations: Abdomen is soft.  Musculoskeletal:        General: No swelling.     Cervical back: Neck supple.  Skin:    General: Skin is warm.  Neurological:     General: No focal deficit present.     Mental Status: She is alert.     Comments: Able to name objects but per Speech has aphasia nd Cognition issues No Focal deficits  Psychiatric:        Mood and Affect: Mood normal.        Thought Content: Thought content normal.    Labs reviewed: Basic Metabolic Panel: Recent Labs    06/26/21 0416 06/27/21 0056 06/28/21 0131 06/29/21 0629 08/24/21 1055 08/25/21 0329 08/26/21 1316 08/27/21 0327 08/27/21 1241 08/28/21 0329  NA 131* 129* 129*   < > 143   < > 128* 130* 129* 132*  K 3.6 3.4* 3.6   < > 3.4*   < > 3.7 3.5  --  3.5  CL 99 97* 97*   < > 109   < > 93* 98  --  99  CO2 25 24 25    < > 25   < > 27 25  --  25  GLUCOSE 113* 118* 114*   < > 130*   < > 117* 105*  --  103*  BUN 11 8 15    < > 27*   < > 16 13  --  19  CREATININE 0.53 0.53 0.61   < > 0.75   < > 0.65 0.39*  --  0.37*  CALCIUM 8.9 8.6* 8.9   < > 8.1*   < > 8.6* 8.0*  --  8.6*  MG 1.8 2.0 2.1  --  1.9  --   --   --   --   --   PHOS 3.0 2.5 3.4  --   --   --   --   --   --   --    < > = values in this interval not displayed.   Liver Function Tests: Recent Labs    06/29/21 0629 07/08/21 1815 08/23/21 1030  AST 21 25 19   ALT 31 32 24  ALKPHOS 41 48 35*  BILITOT 0.7 0.8 0.7  PROT 6.2* 6.7 6.8  ALBUMIN 3.5 3.7 4.3   No results for input(s): LIPASE, AMYLASE in the last 8760 hours. No results for input(s): AMMONIA in the last 8760 hours. CBC: Recent Labs    06/29/21 0629 07/08/21 1815  07/08/21 1821 08/23/21 1030 08/24/21 0332 08/25/21 0329 08/26/21 0328  WBC 8.9 10.9*   < > 12.2* 8.2 11.6* 10.3  NEUTROABS 5.6 7.7  --  9.4*  --   --   --   HGB 13.2 14.2   < > 14.2 13.6 13.7 15.8*  HCT 39.8 42.4   < > 38.8 37.7 39.5 45.1  MCV 88.8 90.8   < > 81.9 85.1 87.8 86.6  PLT 360 414*   < > 423* 365 349 352   < > = values in this interval not displayed.   Cardiac Enzymes: No results for input(s): CKTOTAL, CKMB, CKMBINDEX, TROPONINI in the last 8760 hours. BNP: Invalid input(s): POCBNP Lab Results  Component Value Date   HGBA1C 5.7 (H) 07/09/2021   Lab Results  Component Value Date   TSH 1.117 06/25/2021   No results found for: VITAMINB12 No results found for: FOLATE No results found for: IRON, TIBC, FERRITIN  Imaging and Procedures obtained prior to SNF admission: [place] (3-14 DAYS)  Result Date: 09/02/2021 Mobile cardiac telemetry 7 days 08/10/2021 - 08/18/2021: Dominant rhythm: Sinus. HR 58-118 bpm. Avg HR 79 bpm, in sinus rhythm. 25 episodes of atrial tachycardia, fastest at 156 bpm for 10 beats, longest for 13 beats at 116 bpm. 1.3% isolated SVE, <1% couplet/triplets. 0 episodes of VT. <1% isolated VE,  Couplets. No atrial fibrillation/atrial flutter/VT/high grade AV block, sinus pause >3sec noted. 0 patient triggered events.    Assessment/Plan 1. Hyponatremia Repeat Sodium Pending Addendum Sodium 133 Off Celexa now  2. Infarction of left basal ganglia (HCC) Doing well with therapy Does have cognitive and Speech issues Continue on Plavix and statin [place] Tele was negative for  any A fib run 3. Aphasia Working with speech  4. Oropharyngeal dysphagia On D2 diet right now  5. Essential hypertension On Diovan an d Norvasc  6. Mixed hyperlipidemia Continue on statin  7. Acute cystitis without hematuria More then 100 k of gram negative Rods Culture had 2 different Morphology not Identified On Cipro. Symptoms Improved. Will  leave her on it  8. Anxiety and depression Will continue ativan for now Husband intrested to try something else Cant do SSRi Possible Wellbutrin     Family/ staff Communication:   Labs/tests ordered:

## 2021-09-04 DIAGNOSIS — I69391 Dysphagia following cerebral infarction: Secondary | ICD-10-CM | POA: Diagnosis not present

## 2021-09-04 DIAGNOSIS — R278 Other lack of coordination: Secondary | ICD-10-CM | POA: Diagnosis not present

## 2021-09-04 DIAGNOSIS — R2689 Other abnormalities of gait and mobility: Secondary | ICD-10-CM | POA: Diagnosis not present

## 2021-09-04 DIAGNOSIS — I69320 Aphasia following cerebral infarction: Secondary | ICD-10-CM | POA: Diagnosis not present

## 2021-09-04 DIAGNOSIS — M6389 Disorders of muscle in diseases classified elsewhere, multiple sites: Secondary | ICD-10-CM | POA: Diagnosis not present

## 2021-09-04 DIAGNOSIS — R41841 Cognitive communication deficit: Secondary | ICD-10-CM | POA: Diagnosis not present

## 2021-09-04 DIAGNOSIS — E871 Hypo-osmolality and hyponatremia: Secondary | ICD-10-CM | POA: Diagnosis not present

## 2021-09-04 DIAGNOSIS — G458 Other transient cerebral ischemic attacks and related syndromes: Secondary | ICD-10-CM | POA: Diagnosis not present

## 2021-09-04 DIAGNOSIS — R1312 Dysphagia, oropharyngeal phase: Secondary | ICD-10-CM | POA: Diagnosis not present

## 2021-09-04 DIAGNOSIS — R2681 Unsteadiness on feet: Secondary | ICD-10-CM | POA: Diagnosis not present

## 2021-09-05 ENCOUNTER — Encounter: Payer: Medicare Other | Admitting: Occupational Therapy

## 2021-09-05 ENCOUNTER — Ambulatory Visit: Payer: Medicare Other | Admitting: Physical Therapy

## 2021-09-05 DIAGNOSIS — I69391 Dysphagia following cerebral infarction: Secondary | ICD-10-CM | POA: Diagnosis not present

## 2021-09-05 DIAGNOSIS — R2681 Unsteadiness on feet: Secondary | ICD-10-CM | POA: Diagnosis not present

## 2021-09-05 DIAGNOSIS — M6389 Disorders of muscle in diseases classified elsewhere, multiple sites: Secondary | ICD-10-CM | POA: Diagnosis not present

## 2021-09-05 DIAGNOSIS — R2689 Other abnormalities of gait and mobility: Secondary | ICD-10-CM | POA: Diagnosis not present

## 2021-09-05 DIAGNOSIS — R1312 Dysphagia, oropharyngeal phase: Secondary | ICD-10-CM | POA: Diagnosis not present

## 2021-09-05 DIAGNOSIS — E871 Hypo-osmolality and hyponatremia: Secondary | ICD-10-CM | POA: Diagnosis not present

## 2021-09-05 DIAGNOSIS — G458 Other transient cerebral ischemic attacks and related syndromes: Secondary | ICD-10-CM | POA: Diagnosis not present

## 2021-09-05 DIAGNOSIS — R278 Other lack of coordination: Secondary | ICD-10-CM | POA: Diagnosis not present

## 2021-09-05 DIAGNOSIS — I69320 Aphasia following cerebral infarction: Secondary | ICD-10-CM | POA: Diagnosis not present

## 2021-09-05 DIAGNOSIS — R41841 Cognitive communication deficit: Secondary | ICD-10-CM | POA: Diagnosis not present

## 2021-09-06 ENCOUNTER — Non-Acute Institutional Stay (SKILLED_NURSING_FACILITY): Payer: Medicare Other | Admitting: Adult Health

## 2021-09-06 ENCOUNTER — Encounter: Payer: Self-pay | Admitting: Adult Health

## 2021-09-06 DIAGNOSIS — E871 Hypo-osmolality and hyponatremia: Secondary | ICD-10-CM | POA: Diagnosis not present

## 2021-09-06 DIAGNOSIS — I639 Cerebral infarction, unspecified: Secondary | ICD-10-CM | POA: Diagnosis not present

## 2021-09-06 DIAGNOSIS — R1312 Dysphagia, oropharyngeal phase: Secondary | ICD-10-CM | POA: Diagnosis not present

## 2021-09-06 DIAGNOSIS — R410 Disorientation, unspecified: Secondary | ICD-10-CM | POA: Diagnosis not present

## 2021-09-06 DIAGNOSIS — N3000 Acute cystitis without hematuria: Secondary | ICD-10-CM | POA: Diagnosis not present

## 2021-09-06 DIAGNOSIS — I69320 Aphasia following cerebral infarction: Secondary | ICD-10-CM | POA: Diagnosis not present

## 2021-09-06 DIAGNOSIS — R41841 Cognitive communication deficit: Secondary | ICD-10-CM | POA: Diagnosis not present

## 2021-09-06 DIAGNOSIS — I69391 Dysphagia following cerebral infarction: Secondary | ICD-10-CM | POA: Diagnosis not present

## 2021-09-06 DIAGNOSIS — G458 Other transient cerebral ischemic attacks and related syndromes: Secondary | ICD-10-CM | POA: Diagnosis not present

## 2021-09-06 DIAGNOSIS — M6389 Disorders of muscle in diseases classified elsewhere, multiple sites: Secondary | ICD-10-CM | POA: Diagnosis not present

## 2021-09-06 DIAGNOSIS — R278 Other lack of coordination: Secondary | ICD-10-CM | POA: Diagnosis not present

## 2021-09-06 DIAGNOSIS — R2689 Other abnormalities of gait and mobility: Secondary | ICD-10-CM | POA: Diagnosis not present

## 2021-09-06 DIAGNOSIS — R2681 Unsteadiness on feet: Secondary | ICD-10-CM | POA: Diagnosis not present

## 2021-09-06 NOTE — Progress Notes (Signed)
Location:   [place] Room Number: [place] of Service:  SNF ([phone]) Provider:  Royal Hawthorn, NP  Kelton Pillar, MD  Patient Care Team: Kelton Pillar, MD as PCP - General (Family Medicine) Jerline Pain, MD as PCP - Cardiology (Cardiology)  Extended Emergency Contact Information Primary Emergency Contact: Ignatowski,Maurice Address: [address]          [place], [place] [address] Johnnette Litter of [place] Phone: [phone] Mobile Phone: [phone] Relation: Spouse Secondary Emergency Contact: Claxton,Adam Mobile Phone: [phone] Relation: Son  Code Status:  Full Code Goals of care: Advanced Directive information Advanced Directives 09/06/2021  Does Patient Have a Medical Advance Directive? No  Type of Advance Directive -  Does patient want to make changes to medical advance directive? -  Copy of [place] in Chart? -  Would patient like information on creating a medical advance directive? No - Patient declined     Chief Complaint  Patient presents with   Acute Visit    agitation    HPI:  Pt is a 81 y.o. female seen today for an acute visit for agitation. 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 The nurse reports she has been experiencing increased agitation. Examples are yelling for help frequently saying "help,help,help", calling to urinate but not needing to go frequently, having difficult sleeping at night, appearing anxious. These behaviors are better when her husband is there during  the day. She is using ativan prn which has helped. The nurse requests that it be scheduled but it is not being using q 8 prn as ordered. She has feelings of depression and was on celexa which was stopped due to low sodium. She is also drinking lots of  water. She is on cipro for UTI which was started after a UA C and S showed 2 organisms but none predominant on 2/5.  We have previously done a PVR with bladder scan and not found urinary retention. She denies any burning or bladder pain at this time.    Past Medical History:  Diagnosis Date   Breast cancer ([place]) 1993   bilateral mastectomy; no chemo or radiation   Glaucoma    Hyperlipidemia    Hypertension    Past Surgical History:  Procedure Laterality Date   MASTECTOMY Bilateral 1993   TONSILLECTOMY AND ADENOIDECTOMY  1947   TUBAL LIGATION  1974    Allergies  Allergen Reactions   Crab [Shellfish Allergy] Other (See Comments)    From skin test   Fosamax [Alendronate Sodium] Other (See Comments)    Difficult swallowing   Hydrochlorothiazide Other (See Comments)    hyponatremia   Losartan Cough   Raloxifene Other (See Comments)    Unknown reaction   Ace Inhibitors Cough    Reaction to losartan (pt tolerates valsartan)    Allergies as of 09/06/2021       Reactions   Crab [shellfish Allergy] Other (See Comments)   From skin test   Fosamax [alendronate Sodium] Other (See Comments)   Difficult swallowing   Hydrochlorothiazide Other (See Comments)   hyponatremia   Losartan Cough   Raloxifene Other (See Comments)   Unknown reaction   Ace Inhibitors Cough   Reaction to losartan (pt tolerates valsartan)        Medication List  Accurate as of September 06, 2021 12:40 PM. If you have any questions, ask your nurse or doctor.          acetaminophen 325 MG tablet Commonly known as: TYLENOL Take 2 tablets (650 mg total) by mouth every 4 (four) hours as needed for mild pain (or temp > 37.5 C (99.5 F)).   amLODipine 5 MG tablet Commonly known as: NORVASC Take 1 tablet (5 mg total) by mouth daily.   brinzolamide 1 % ophthalmic suspension Commonly known as: AZOPT Place 1 drop into both eyes 2 (two) times daily.   ciprofloxacin 500 MG tablet Commonly known as:  CIPRO Take 500 mg by mouth 2 (two) times daily.   clopidogrel 75 MG tablet Commonly known as: Plavix Plavix 75 mg daily beginning 07/26/2021   LORazepam 0.5 MG tablet Commonly known as: ATIVAN Take 0.25 mg by mouth every 8 (eight) hours as needed.   melatonin 3 MG Tabs tablet 1 tablet at bedtime as needed   polyethylene glycol 17 g packet Commonly known as: MIRALAX / GLYCOLAX Take 17 g by mouth daily.   rosuvastatin 10 MG tablet Commonly known as: CRESTOR Take 1 tablet (10 mg total) by mouth daily.   saccharomyces boulardii 250 MG capsule Commonly known as: FLORASTOR Take 250 mg by mouth 2 (two) times daily.   senna-docusate 8.6-50 MG tablet Commonly known as: Senokot-S Take 1 tablet by mouth at bedtime as needed for mild constipation.   timolol 0.5 % ophthalmic solution Commonly known as: TIMOPTIC Place 1 drop into both eyes 2 (two) times daily.   valsartan 320 MG tablet Commonly known as: DIOVAN Take 1 tablet (320 mg total) by mouth daily.        Review of Systems  Constitutional:  Positive for activity change. Negative for appetite change, chills, diaphoresis, fatigue, fever and unexpected weight change.  HENT:  Negative for congestion.   Respiratory:  Negative for cough, shortness of breath and wheezing.   Cardiovascular:  Negative for chest pain, palpitations and leg swelling.  Gastrointestinal:  Negative for abdominal distention, abdominal pain, constipation and diarrhea.  Genitourinary:  Negative for difficulty urinating, dysuria, flank pain, frequency, hematuria, pelvic pain and urgency.  Musculoskeletal:  Positive for gait problem. Negative for arthralgias, back pain, joint swelling and myalgias.  Neurological:  Positive for facial asymmetry, speech difficulty and weakness. Negative for dizziness, tremors, seizures, syncope, light-headedness, numbness and headaches.  Psychiatric/Behavioral:  Positive for agitation, behavioral problems, confusion, dysphoric  mood and sleep disturbance. The patient is nervous/anxious.    Immunization History  Administered Date(s) Administered   H1N1 08/23/2008   Influenza Split 05/17/2008, 06/02/2009, 04/19/2011   Influenza,inj,quad, With Preservative 04/06/2014   Influenza-Unspecified 05/14/2012, 04/05/2013, 04/13/2015, 05/09/2016, 05/15/2017, 06/30/2018, 04/10/2019, 06/03/2020, 04/11/2021   Moderna Covid-19 Vaccine Bivalent Booster 29yrs & up 05/11/2021   Moderna SARS-COV2 Booster Vaccination 01/16/2021   Moderna Sars-Covid-2 Vaccination 08/10/2019, 09/07/2019, 06/13/2020, 01/16/2021   Pneumococcal Conjugate-13 04/06/2014   Pneumococcal Polysaccharide-23 08/20/2007   Td 11/27/1998, 09/25/2020   Tdap 08/20/2007   Zoster, Live 02/26/2006, 11/14/2016, 05/22/2017   Pertinent  Health Maintenance Due  Topic Date Due   INFLUENZA VACCINE  Completed   DEXA SCAN  Completed   Fall Risk 08/26/2021 08/27/2021 08/27/2021 08/28/2021 08/28/2021  Falls in the past year? - - - - -  Patient Fall Risk Level High fall risk High fall risk High fall risk High fall risk High fall risk   Functional Status Survey:    Vitals:   09/06/21 1233  BP: 128/76  Pulse: 95  Resp: 14  Temp: 98.6 F (37 C)  SpO2: 95%  Weight: 139 lb 6.4 oz (63.2 kg)  Height: 5\' 5"  (1.651 m)   Body mass index is 23.2 kg/m. Physical Exam Vitals and nursing note reviewed.  Constitutional:      General: She is not in acute distress.    Appearance: She is not diaphoretic.  HENT:     Head: Normocephalic and atraumatic.  Neck:     Vascular: No JVD.  Cardiovascular:     Rate and Rhythm: Normal rate and regular rhythm.     Heart sounds: No murmur heard. Pulmonary:     Effort: Pulmonary effort is normal. No respiratory distress.     Breath sounds: Normal breath sounds. No wheezing.  Abdominal:     General: Abdomen is flat. Bowel sounds are normal. There is no distension.     Palpations: Abdomen is soft.     Tenderness: There is no right CVA  tenderness or left CVA tenderness.  Skin:    General: Skin is warm and dry.  Neurological:     Mental Status: She is alert.     Comments: Right sided weakness, aphasia, flat affect. Able to answer questions slowly and f/c.     Labs reviewed: Recent Labs    06/26/21 0416 06/27/21 0056 06/28/21 0131 06/29/21 0629 08/24/21 1055 08/25/21 0329 08/26/21 1316 08/27/21 0327 08/27/21 1241 08/28/21 0329 08/30/21 0000 09/03/21 0000  NA 131* 129* 129*   < > 143   < > 128* 130* 129* 132* 135*  --   K 3.6 3.4* 3.6   < > 3.4*   < > 3.7 3.5  --  3.5 4.6  --   CL 99 97* 97*   < > 109   < > 93* 98  --  99 97*  --   CO2 25 24 25    < > 25   < > 27 25  --  25 26*  --   GLUCOSE 113* 118* 114*   < > 130*   < > 117* 105*  --  103*  --   --   BUN 11 8 15    < > 27*   < > 16 13  --  19 14  --   CREATININE 0.53 0.53 0.61   < > 0.75   < > 0.65 0.39*  --  0.37* 0.4* 0.4*  CALCIUM 8.9 8.6* 8.9   < > 8.1*   < > 8.6* 8.0*  --  8.6* 9.1 9.1  MG 1.8 2.0 2.1  --  1.9  --   --   --   --   --   --   --   PHOS 3.0 2.5 3.4  --   --   --   --   --   --   --   --   --    < > = values in this interval not displayed.   Recent Labs    06/29/21 0629 07/08/21 1815 08/23/21 1030  AST 21 25 19   ALT 31 32 24  ALKPHOS 41 48 35*  BILITOT 0.7 0.8 0.7  PROT 6.2* 6.7 6.8  ALBUMIN 3.5 3.7 4.3   Recent Labs    06/29/21 0629 07/08/21 1815 07/08/21 1821 08/23/21 1030 08/24/21 0332 08/25/21 0329 08/26/21 0328  WBC 8.9 10.9*   < > 12.2* 8.2 11.6* 10.3  NEUTROABS 5.6 7.7  --  9.4*  --   --   --  HGB 13.2 14.2   < > 14.2 13.6 13.7 15.8*  HCT 39.8 42.4   < > 38.8 37.7 39.5 45.1  MCV 88.8 90.8   < > 81.9 85.1 87.8 86.6  PLT 360 414*   < > 423* 365 349 352   < > = values in this interval not displayed.   Lab Results  Component Value Date   TSH 1.117 06/25/2021   Lab Results  Component Value Date   HGBA1C 5.7 (H) 07/09/2021   Lab Results  Component Value Date   CHOL 120 07/09/2021   HDL 65 07/09/2021    LDLCALC 39 07/09/2021   TRIG 79 07/09/2021   CHOLHDL 1.8 07/09/2021    Significant Diagnostic Results in last 30 days:  LONG TERM MONITOR-LIVE TELEMETRY (3-14 DAYS)  Result Date: 09/02/2021 Mobile cardiac telemetry 7 days 08/10/2021 - 08/18/2021: Dominant rhythm: Sinus. HR 58-118 bpm. Avg HR 79 bpm, in sinus rhythm. 25 episodes of atrial tachycardia, fastest at 156 bpm for 10 beats, longest for 13 beats at 116 bpm. 1.3% isolated SVE, <1% couplet/triplets. 0 episodes of VT. <1% isolated VE,  Couplets. No atrial fibrillation/atrial flutter/VT/high grade AV block, sinus pause >3sec noted. 0 patient triggered events.    Assessment/Plan  1. Hyponatremia NA 133 09/03/21 Reduce water intake due to excessive amts, frequency, and low sodium to 1800 cc FR Repeat BMP in am   2. Acute cystitis without hematuria Not clear if she had a UTI but did improve clinically with cipro Continues with frequency and perseveration which seems better when her husband is there. May be associated with her CVA and delirium. Will try reducing water intake. Not clear if myrbetriq would help.   3. Acute delirium Continue ativan as needed. They are not using it regularly enough to schedule it. May hope is that over time she will be able to taper from it. In the future she may need to try another antidepressant as well for anxiety and depression but due to her low sodium she will have limited options. F/U next week.   4. Infarction of left basal ganglia (HCC) Making some gains per speech therapy Continues to work with PT and OT May need skilled care.   Family/ staff Communication: discussed with resident and her husband   Labs/tests ordered:   f/u BMP In am

## 2021-09-07 DIAGNOSIS — R41841 Cognitive communication deficit: Secondary | ICD-10-CM | POA: Diagnosis not present

## 2021-09-07 DIAGNOSIS — R2681 Unsteadiness on feet: Secondary | ICD-10-CM | POA: Diagnosis not present

## 2021-09-07 DIAGNOSIS — M6389 Disorders of muscle in diseases classified elsewhere, multiple sites: Secondary | ICD-10-CM | POA: Diagnosis not present

## 2021-09-07 DIAGNOSIS — G458 Other transient cerebral ischemic attacks and related syndromes: Secondary | ICD-10-CM | POA: Diagnosis not present

## 2021-09-07 DIAGNOSIS — R278 Other lack of coordination: Secondary | ICD-10-CM | POA: Diagnosis not present

## 2021-09-07 DIAGNOSIS — R2689 Other abnormalities of gait and mobility: Secondary | ICD-10-CM | POA: Diagnosis not present

## 2021-09-07 DIAGNOSIS — I69391 Dysphagia following cerebral infarction: Secondary | ICD-10-CM | POA: Diagnosis not present

## 2021-09-07 DIAGNOSIS — R1312 Dysphagia, oropharyngeal phase: Secondary | ICD-10-CM | POA: Diagnosis not present

## 2021-09-07 DIAGNOSIS — E871 Hypo-osmolality and hyponatremia: Secondary | ICD-10-CM | POA: Diagnosis not present

## 2021-09-07 DIAGNOSIS — I69320 Aphasia following cerebral infarction: Secondary | ICD-10-CM | POA: Diagnosis not present

## 2021-09-07 LAB — BASIC METABOLIC PANEL
BUN: 9 (ref 4–21)
CO2: 23 — AB (ref 13–22)
Chloride: 101 (ref 99–108)
Creatinine: 0.4 — AB (ref 0.5–1.1)
Glucose: 108
Potassium: 4.4 (ref 3.4–5.3)
Sodium: 136 — AB (ref 137–147)

## 2021-09-07 LAB — COMPREHENSIVE METABOLIC PANEL: Calcium: 8.8 (ref 8.7–10.7)

## 2021-09-10 ENCOUNTER — Non-Acute Institutional Stay (SKILLED_NURSING_FACILITY): Payer: Medicare Other | Admitting: Internal Medicine

## 2021-09-10 ENCOUNTER — Ambulatory Visit: Payer: Medicare Other | Admitting: Physical Therapy

## 2021-09-10 ENCOUNTER — Encounter: Payer: Self-pay | Admitting: Internal Medicine

## 2021-09-10 ENCOUNTER — Encounter: Payer: Medicare Other | Admitting: Occupational Therapy

## 2021-09-10 DIAGNOSIS — R2681 Unsteadiness on feet: Secondary | ICD-10-CM | POA: Diagnosis not present

## 2021-09-10 DIAGNOSIS — I639 Cerebral infarction, unspecified: Secondary | ICD-10-CM | POA: Diagnosis not present

## 2021-09-10 DIAGNOSIS — F419 Anxiety disorder, unspecified: Secondary | ICD-10-CM | POA: Diagnosis not present

## 2021-09-10 DIAGNOSIS — E871 Hypo-osmolality and hyponatremia: Secondary | ICD-10-CM | POA: Diagnosis not present

## 2021-09-10 DIAGNOSIS — F32A Depression, unspecified: Secondary | ICD-10-CM

## 2021-09-10 DIAGNOSIS — R4189 Other symptoms and signs involving cognitive functions and awareness: Secondary | ICD-10-CM

## 2021-09-10 DIAGNOSIS — R1312 Dysphagia, oropharyngeal phase: Secondary | ICD-10-CM | POA: Diagnosis not present

## 2021-09-10 DIAGNOSIS — R350 Frequency of micturition: Secondary | ICD-10-CM | POA: Diagnosis not present

## 2021-09-10 DIAGNOSIS — R4701 Aphasia: Secondary | ICD-10-CM | POA: Diagnosis not present

## 2021-09-10 DIAGNOSIS — R2689 Other abnormalities of gait and mobility: Secondary | ICD-10-CM | POA: Diagnosis not present

## 2021-09-10 DIAGNOSIS — I69391 Dysphagia following cerebral infarction: Secondary | ICD-10-CM | POA: Diagnosis not present

## 2021-09-10 DIAGNOSIS — I10 Essential (primary) hypertension: Secondary | ICD-10-CM

## 2021-09-10 DIAGNOSIS — R278 Other lack of coordination: Secondary | ICD-10-CM | POA: Diagnosis not present

## 2021-09-10 DIAGNOSIS — R4689 Other symptoms and signs involving appearance and behavior: Secondary | ICD-10-CM | POA: Diagnosis not present

## 2021-09-10 DIAGNOSIS — E782 Mixed hyperlipidemia: Secondary | ICD-10-CM

## 2021-09-10 DIAGNOSIS — I69320 Aphasia following cerebral infarction: Secondary | ICD-10-CM | POA: Diagnosis not present

## 2021-09-10 DIAGNOSIS — M6389 Disorders of muscle in diseases classified elsewhere, multiple sites: Secondary | ICD-10-CM | POA: Diagnosis not present

## 2021-09-10 DIAGNOSIS — G458 Other transient cerebral ischemic attacks and related syndromes: Secondary | ICD-10-CM | POA: Diagnosis not present

## 2021-09-10 DIAGNOSIS — R41841 Cognitive communication deficit: Secondary | ICD-10-CM | POA: Diagnosis not present

## 2021-09-10 NOTE — Progress Notes (Addendum)
Location:   [facility] Room Number: [place] of Service:  SNF [phone]) Provider:  Veleta Miners MD  Kelton Pillar, MD  Patient Care Team: Kelton Pillar, MD as PCP - General (Family Medicine) Jerline Pain, MD as PCP - Cardiology (Cardiology)  Extended Emergency Contact Information Primary Emergency Contact: Kulik,Maurice Address: [address]          [place], [place] [address] Johnnette Litter of [place] Phone: [phone] Mobile Phone: [phone] Relation: Spouse Secondary Emergency Contact: Masini,Adam Mobile Phone: [phone] Relation: Son  Code Status:  Full Code Goals of care: Advanced Directive information Advanced Directives 09/10/2021  Does Patient Have a Medical Advance Directive? No  Type of Advance Directive -  Does patient want to make changes to medical advance directive? -  Copy of [place] in Chart? -  Would patient like information on creating a medical advance directive? No - Patient declined     Chief Complaint  Patient presents with   Acute Visit    HPI:  Pt is a 81 y.o. female seen today for an acute visit for Behavior issues  Admitted in the hospital from 1/26 to 1/31 for hyponatremia   Patient has a history of CVA in 11/27 MRI revealed left basal ganglia infarct Had residual Aphasia and Right sided weakness Was treated with Brilinta for 30 days with Aspirin And then Plavix in 12/28 Cardiology did Monitoring for 1 week .Patient could not finish all 3 weeks   Initially noticed to have Mild Hyponatremia Chlorthalidone was  discontinued But then she was started on Celexa in Dec BMP showed Sodium of 116 Urine Sodium less then 10 Urine Osmolarity 135 She was hydrated Sodium now 132 Celexa was stopped. Thought to be cause of Hyponatremia  Since being here she has been having Some behavior issue and Cognition issues  including going ot the bathroom 20-25 times.   Washing her hands  Not able to take care of her ADLS without Cueing She denied any acute issues Denied Dysuria or abdominal pain or nausea Able to get up with her walker Husband in the room  He thinks he can't take take of her in apartment    Past Medical History:  Diagnosis Date   Breast cancer ([place]) 1993   bilateral mastectomy; no chemo or radiation   Glaucoma    Hyperlipidemia    Hypertension    Past Surgical History:  Procedure Laterality Date   MASTECTOMY Bilateral 1993   TONSILLECTOMY AND [place]    Allergies  Allergen Reactions   Crab [Shellfish Allergy] Other (See Comments)    From skin test   Fosamax [Alendronate Sodium] Other (See Comments)    Difficult swallowing   Hydrochlorothiazide Other (See Comments)    hyponatremia   Losartan Cough   Raloxifene Other (See Comments)    Unknown reaction   Ace Inhibitors Cough    Reaction to losartan (pt tolerates valsartan)    Allergies as of 09/10/2021       Reactions   Crab [shellfish Allergy] Other (See Comments)   From skin test   Fosamax [alendronate Sodium] Other (See Comments)   Difficult swallowing   Hydrochlorothiazide Other (See Comments)   hyponatremia   Losartan Cough   Raloxifene Other (See Comments)   Unknown reaction   Ace Inhibitors Cough   Reaction to losartan (pt tolerates valsartan)        Medication List  Accurate as of September 10, 2021  9:59 AM. If you have any questions, ask your nurse or doctor.          acetaminophen 325 MG tablet Commonly known as: TYLENOL Take 2 tablets (650 mg total) by mouth every 4 (four) hours as needed for mild pain (or temp > 37.5 C (99.5 F)).   amLODipine 5 MG tablet Commonly known as: NORVASC Take 1 tablet (5 mg total) by mouth daily.   brinzolamide 1 % ophthalmic suspension Commonly known as: AZOPT Place 1 drop into both eyes 2 (two) times daily.   clopidogrel 75 MG tablet Commonly known as:  Plavix Plavix 75 mg daily beginning 07/26/2021   LORazepam 0.5 MG tablet Commonly known as: ATIVAN Take 0.25 mg by mouth every 8 (eight) hours as needed.   melatonin 3 MG Tabs tablet 1 tablet at bedtime as needed   polyethylene glycol 17 g packet Commonly known as: MIRALAX / GLYCOLAX Take 17 g by mouth daily.   rosuvastatin 10 MG tablet Commonly known as: CRESTOR Take 1 tablet (10 mg total) by mouth daily.   saccharomyces boulardii 250 MG capsule Commonly known as: FLORASTOR Take 250 mg by mouth 2 (two) times daily.   senna-docusate 8.6-50 MG tablet Commonly known as: Senokot-S Take 1 tablet by mouth at bedtime as needed for mild constipation.   timolol 0.5 % ophthalmic solution Commonly known as: TIMOPTIC Place 1 drop into both eyes 2 (two) times daily.   valsartan 320 MG tablet Commonly known as: DIOVAN Take 1 tablet (320 mg total) by mouth daily.        Review of Systems  Constitutional:  Positive for activity change. Negative for appetite change.  HENT: Negative.    Respiratory:  Negative for cough and shortness of breath.   Cardiovascular:  Negative for leg swelling.  Gastrointestinal:  Negative for constipation.  Genitourinary:  Positive for frequency and urgency.  Musculoskeletal:  Positive for gait problem. Negative for arthralgias and myalgias.  Skin: Negative.   Neurological:  Negative for dizziness and weakness.  Psychiatric/Behavioral:  Positive for confusion, dysphoric mood and sleep disturbance. The patient is nervous/anxious.    Immunization History  Administered Date(s) Administered   H1N1 08/23/2008   Influenza Split 05/17/2008, 06/02/2009, 04/19/2011   Influenza,inj,quad, With Preservative 04/06/2014   Influenza-Unspecified 05/14/2012, 04/05/2013, 04/13/2015, 05/09/2016, 05/15/2017, 06/30/2018, 04/10/2019, 06/03/2020, 04/11/2021   Moderna Covid-19 Vaccine Bivalent Booster 42yrs & up 05/11/2021   Moderna SARS-COV2 Booster Vaccination  01/16/2021   Moderna Sars-Covid-2 Vaccination 08/10/2019, 09/07/2019, 06/13/2020, 01/16/2021   Pneumococcal Conjugate-13 04/06/2014   Pneumococcal Polysaccharide-23 08/20/2007   Td 11/27/1998, 09/25/2020   Tdap 08/20/2007   Zoster, Live 02/26/2006, 11/14/2016, 05/22/2017   Pertinent  Health Maintenance Due  Topic Date Due   INFLUENZA VACCINE  Completed   DEXA SCAN  Completed   Fall Risk 08/26/2021 08/27/2021 08/27/2021 08/28/2021 08/28/2021  Falls in the past year? - - - - -  Patient Fall Risk Level High fall risk High fall risk High fall risk High fall risk High fall risk   Functional Status Survey:    Vitals:   09/10/21 0949  BP: (!) 149/77  Pulse: 82  Resp: 18  Temp: 98.1 F (36.7 C)  SpO2: 96%  Weight: 139 lb 6.4 oz (63.2 kg)  Height: 5\' 5"  (1.651 m)   Body mass index is 23.2 kg/m. Physical Exam Vitals reviewed.  Constitutional:      Appearance: Normal appearance.  HENT:     Head: Normocephalic.  Nose: Nose normal.     Mouth/Throat:     Mouth: Mucous membranes are moist.     Pharynx: Oropharynx is clear.  Eyes:     Pupils: Pupils are equal, round, and reactive to light.  Cardiovascular:     Rate and Rhythm: Normal rate and regular rhythm.     Pulses: Normal pulses.     Heart sounds: Normal heart sounds. No murmur heard. Pulmonary:     Effort: Pulmonary effort is normal.     Breath sounds: Normal breath sounds.  Abdominal:     General: Abdomen is flat. Bowel sounds are normal.     Palpations: Abdomen is soft.  Musculoskeletal:        General: No swelling.     Cervical back: Neck supple.  Skin:    General: Skin is warm.  Neurological:     General: No focal deficit present.     Mental Status: She is alert.     Comments: Has Aphasia Can walk with Mild assist and her walker  Psychiatric:        Mood and Affect: Mood normal.        Thought Content: Thought content normal.    Labs reviewed: Recent Labs    06/26/21 0416 06/27/21 0056 06/28/21 0131  06/29/21 0629 08/24/21 1055 08/25/21 0329 08/26/21 1316 08/27/21 0327 08/27/21 1241 08/28/21 0329 08/30/21 0000 09/03/21 0000 09/07/21 0000  NA 131* 129* 129*   < > 143   < > 128* 130*   < > 132* 135*  --  136*  K 3.6 3.4* 3.6   < > 3.4*   < > 3.7 3.5  --  3.5 4.6  --  4.4  CL 99 97* 97*   < > 109   < > 93* 98  --  99 97*  --  101  CO2 25 24 25    < > 25   < > 27 25  --  25 26*  --  23*  GLUCOSE 113* 118* 114*   < > 130*   < > 117* 105*  --  103*  --   --   --   BUN 11 8 15    < > 27*   < > 16 13  --  19 14  --  9  CREATININE 0.53 0.53 0.61   < > 0.75   < > 0.65 0.39*  --  0.37* 0.4* 0.4* 0.4*  CALCIUM 8.9 8.6* 8.9   < > 8.1*   < > 8.6* 8.0*  --  8.6* 9.1 9.1 8.8  MG 1.8 2.0 2.1  --  1.9  --   --   --   --   --   --   --   --   PHOS 3.0 2.5 3.4  --   --   --   --   --   --   --   --   --   --    < > = values in this interval not displayed.   Recent Labs    06/29/21 0629 07/08/21 1815 08/23/21 1030  AST 21 25 19   ALT 31 32 24  ALKPHOS 41 48 35*  BILITOT 0.7 0.8 0.7  PROT 6.2* 6.7 6.8  ALBUMIN 3.5 3.7 4.3   Recent Labs    06/29/21 0629 07/08/21 1815 07/08/21 1821 08/23/21 1030 08/24/21 0332 08/25/21 0329 08/26/21 0328  WBC 8.9 10.9*   < > 12.2* 8.2 11.6* 10.3  NEUTROABS 5.6  7.7  --  9.4*  --   --   --   HGB 13.2 14.2   < > 14.2 13.6 13.7 15.8*  HCT 39.8 42.4   < > 38.8 37.7 39.5 45.1  MCV 88.8 90.8   < > 81.9 85.1 87.8 86.6  PLT 360 414*   < > 423* 365 349 352   < > = values in this interval not displayed.   Lab Results  Component Value Date   TSH 1.117 06/25/2021   Lab Results  Component Value Date   HGBA1C 5.7 (H) 07/09/2021   Lab Results  Component Value Date   CHOL 120 07/09/2021   HDL 65 07/09/2021   LDLCALC 39 07/09/2021   TRIG 79 07/09/2021   CHOLHDL 1.8 07/09/2021    Significant Diagnostic Results in last 30 days:  LONG TERM MONITOR-LIVE TELEMETRY (3-14 DAYS)  Result Date: 09/02/2021 Mobile cardiac telemetry 7 days 08/10/2021 - 08/18/2021:  Dominant rhythm: Sinus. HR 58-118 bpm. Avg HR 79 bpm, in sinus rhythm. 25 episodes of atrial tachycardia, fastest at 156 bpm for 10 beats, longest for 13 beats at 116 bpm. 1.3% isolated SVE, <1% couplet/triplets. 0 episodes of VT. <1% isolated VE,  Couplets. No atrial fibrillation/atrial flutter/VT/high grade AV block, sinus pause >3sec noted. 0 patient triggered events.    Assessment/Plan 1. Cognitive and behavioral changes D/w Speech and Nurses she is having behavior issues due to her Cognition  Also Late effect of her CVA Unable to do her ADLS witout Cueing  Will start her on Seroquel 12.5 mg BID Also on ativan  0.5 mg Prn which is helping  2. Infarction of left basal ganglia (HCC) On Statin and Plavix Cardiac Tele done only for one week was negative for A Fib Needs to follow with Neurology 3. Hyponatremia Repeat Sodium is stable Off SSRi and on Fluid restriction  4. Aphasia Working with therapy  5. Oropharyngeal dysphagia On D 2 right now  6. Essential hypertension On Diovan and Norvasc  7. Mixed hyperlipidemia Statin  8. Anxiety and depression Start her on Remeron 7.5 in 2 days once see how she does with Seroquel  9. Urinary frequency Wsa treated for UTI  Now seems more Behavior issue ? Urology or trial of Mybetriq if continues    Family/ staff Communication: Husband in the Room  Labs/tests ordered:

## 2021-09-11 DIAGNOSIS — I69391 Dysphagia following cerebral infarction: Secondary | ICD-10-CM | POA: Diagnosis not present

## 2021-09-11 DIAGNOSIS — I69320 Aphasia following cerebral infarction: Secondary | ICD-10-CM | POA: Diagnosis not present

## 2021-09-11 DIAGNOSIS — R1312 Dysphagia, oropharyngeal phase: Secondary | ICD-10-CM | POA: Diagnosis not present

## 2021-09-11 DIAGNOSIS — G458 Other transient cerebral ischemic attacks and related syndromes: Secondary | ICD-10-CM | POA: Diagnosis not present

## 2021-09-11 DIAGNOSIS — M6389 Disorders of muscle in diseases classified elsewhere, multiple sites: Secondary | ICD-10-CM | POA: Diagnosis not present

## 2021-09-11 DIAGNOSIS — R278 Other lack of coordination: Secondary | ICD-10-CM | POA: Diagnosis not present

## 2021-09-11 DIAGNOSIS — E871 Hypo-osmolality and hyponatremia: Secondary | ICD-10-CM | POA: Diagnosis not present

## 2021-09-11 DIAGNOSIS — R2681 Unsteadiness on feet: Secondary | ICD-10-CM | POA: Diagnosis not present

## 2021-09-11 DIAGNOSIS — R2689 Other abnormalities of gait and mobility: Secondary | ICD-10-CM | POA: Diagnosis not present

## 2021-09-11 DIAGNOSIS — R41841 Cognitive communication deficit: Secondary | ICD-10-CM | POA: Diagnosis not present

## 2021-09-11 NOTE — Addendum Note (Signed)
Addended by: Georgina Snell on: 09/11/2021 12:16 PM   Modules accepted: Level of Service

## 2021-09-12 ENCOUNTER — Encounter: Payer: Medicare Other | Admitting: Occupational Therapy

## 2021-09-12 ENCOUNTER — Ambulatory Visit: Payer: Medicare Other | Admitting: Physical Therapy

## 2021-09-12 DIAGNOSIS — R41841 Cognitive communication deficit: Secondary | ICD-10-CM | POA: Diagnosis not present

## 2021-09-12 DIAGNOSIS — R2689 Other abnormalities of gait and mobility: Secondary | ICD-10-CM | POA: Diagnosis not present

## 2021-09-12 DIAGNOSIS — I69320 Aphasia following cerebral infarction: Secondary | ICD-10-CM | POA: Diagnosis not present

## 2021-09-12 DIAGNOSIS — R278 Other lack of coordination: Secondary | ICD-10-CM | POA: Diagnosis not present

## 2021-09-12 DIAGNOSIS — I69391 Dysphagia following cerebral infarction: Secondary | ICD-10-CM | POA: Diagnosis not present

## 2021-09-12 DIAGNOSIS — M6389 Disorders of muscle in diseases classified elsewhere, multiple sites: Secondary | ICD-10-CM | POA: Diagnosis not present

## 2021-09-12 DIAGNOSIS — R1312 Dysphagia, oropharyngeal phase: Secondary | ICD-10-CM | POA: Diagnosis not present

## 2021-09-12 DIAGNOSIS — G458 Other transient cerebral ischemic attacks and related syndromes: Secondary | ICD-10-CM | POA: Diagnosis not present

## 2021-09-12 DIAGNOSIS — E871 Hypo-osmolality and hyponatremia: Secondary | ICD-10-CM | POA: Diagnosis not present

## 2021-09-12 DIAGNOSIS — R2681 Unsteadiness on feet: Secondary | ICD-10-CM | POA: Diagnosis not present

## 2021-09-13 ENCOUNTER — Non-Acute Institutional Stay (SKILLED_NURSING_FACILITY): Payer: Medicare Other | Admitting: Adult Health

## 2021-09-13 ENCOUNTER — Encounter: Payer: Self-pay | Admitting: Adult Health

## 2021-09-13 DIAGNOSIS — F32A Depression, unspecified: Secondary | ICD-10-CM | POA: Diagnosis not present

## 2021-09-13 DIAGNOSIS — I69391 Dysphagia following cerebral infarction: Secondary | ICD-10-CM | POA: Diagnosis not present

## 2021-09-13 DIAGNOSIS — F419 Anxiety disorder, unspecified: Secondary | ICD-10-CM

## 2021-09-13 DIAGNOSIS — R410 Disorientation, unspecified: Secondary | ICD-10-CM | POA: Diagnosis not present

## 2021-09-13 DIAGNOSIS — I679 Cerebrovascular disease, unspecified: Secondary | ICD-10-CM | POA: Diagnosis not present

## 2021-09-13 DIAGNOSIS — I69320 Aphasia following cerebral infarction: Secondary | ICD-10-CM | POA: Diagnosis not present

## 2021-09-13 DIAGNOSIS — M6389 Disorders of muscle in diseases classified elsewhere, multiple sites: Secondary | ICD-10-CM | POA: Diagnosis not present

## 2021-09-13 DIAGNOSIS — R2689 Other abnormalities of gait and mobility: Secondary | ICD-10-CM | POA: Diagnosis not present

## 2021-09-13 DIAGNOSIS — R1312 Dysphagia, oropharyngeal phase: Secondary | ICD-10-CM | POA: Diagnosis not present

## 2021-09-13 DIAGNOSIS — R278 Other lack of coordination: Secondary | ICD-10-CM | POA: Diagnosis not present

## 2021-09-13 DIAGNOSIS — R41841 Cognitive communication deficit: Secondary | ICD-10-CM | POA: Diagnosis not present

## 2021-09-13 DIAGNOSIS — R2681 Unsteadiness on feet: Secondary | ICD-10-CM | POA: Diagnosis not present

## 2021-09-13 DIAGNOSIS — G458 Other transient cerebral ischemic attacks and related syndromes: Secondary | ICD-10-CM | POA: Diagnosis not present

## 2021-09-13 DIAGNOSIS — E871 Hypo-osmolality and hyponatremia: Secondary | ICD-10-CM | POA: Diagnosis not present

## 2021-09-13 NOTE — Progress Notes (Signed)
Location:    [facility] Room Number: [place] of Service:  SNF ([phone]) Provider: Royal Hawthorn, NP   Kelton Pillar, MD  Patient Care Team: Kelton Pillar, MD as PCP - General (Family Medicine) Jerline Pain, MD as PCP - Cardiology (Cardiology)  Extended Emergency Contact Information Primary Emergency Contact: Devenport,Maurice Address: [address]          [place], [place] [address] Johnnette Litter of [place] Phone: [phone] Mobile Phone: [phone] Relation: Spouse Secondary Emergency Contact: Jech,Adam Mobile Phone: [phone] Relation: Son  Code Status:  Full Code Goals of care: Advanced Directive information Advanced Directives 09/13/2021  Does Patient Have a Medical Advance Directive? No  Type of Advance Directive -  Does patient want to make changes to medical advance directive? -  Copy of [place] in Chart? -  Would patient like information on creating a medical advance directive? No - Patient declined     Chief Complaint  Patient presents with   Acute Visit     f/u agitation skilled     HPI:  Pt is a 81 y.o. female seen today for an acute visit for follow up regarding agitation/delirium.  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 She was sent to the hospital for low sodium 116 thought to due to celexa 08/23/21 and is now in rehab at [facility]. Last NA 136 09/07/21. She will start on Remeron 2/16 and already started seroquel 2/13 by Dr Lyndel Safe to treat depression, agitation, lack of sleep, and delirium. She was having periods of fixation on urination and perseveration.  The nurse reports the her behaviors have improved. She has not needed to use prn as often. Continues to call out for help frequently. Needs redirection and cuing. Urinating less frequently. She had a careplan and will likely move to memory care.   Past  Medical History:  Diagnosis Date   Breast cancer ([place]) 1993   bilateral mastectomy; no chemo or radiation   Glaucoma    Hyperlipidemia    Hypertension    Past Surgical History:  Procedure Laterality Date   MASTECTOMY Bilateral 1993   TONSILLECTOMY AND ADENOIDECTOMY  1947   TUBAL LIGATION  1974    Allergies  Allergen Reactions   Crab [Shellfish Allergy] Other (See Comments)    From skin test   Fosamax [Alendronate Sodium] Other (See Comments)    Difficult swallowing   Hydrochlorothiazide Other (See Comments)    hyponatremia   Losartan Cough   Raloxifene Other (See Comments)    Unknown reaction   Ace Inhibitors Cough    Reaction to losartan (pt tolerates valsartan)    Allergies as of 09/13/2021       Reactions   Crab [shellfish Allergy] Other (See Comments)   From skin test   Fosamax [alendronate Sodium] Other (See Comments)   Difficult swallowing   Hydrochlorothiazide Other (See Comments)   hyponatremia   Losartan Cough   Raloxifene Other (See Comments)   Unknown reaction   Ace Inhibitors Cough   Reaction to losartan (pt tolerates valsartan)        Medication List        Accurate as of September 13, 2021  9:18 AM. If you have any questions, ask your nurse or doctor.          acetaminophen 325 MG tablet Commonly known as: TYLENOL Take 2 tablets (650 mg total) by mouth every 4 (  four) hours as needed for mild pain (or temp > 37.5 C (99.5 F)).   amLODipine 5 MG tablet Commonly known as: NORVASC Take 1 tablet (5 mg total) by mouth daily.   brinzolamide 1 % ophthalmic suspension Commonly known as: AZOPT Place 1 drop into both eyes 2 (two) times daily.   clopidogrel 75 MG tablet Commonly known as: Plavix Plavix 75 mg daily beginning 07/26/2021   LORazepam 0.5 MG tablet Commonly known as: ATIVAN Take 0.25 mg by mouth every 8 (eight) hours as needed.   melatonin 3 MG Tabs tablet 1 tablet at bedtime as needed   mirtazapine 7.5 MG tablet Commonly  known as: REMERON Take 7.5 mg by mouth at bedtime.   polyethylene glycol 17 g packet Commonly known as: MIRALAX / GLYCOLAX Take 17 g by mouth daily.   QUEtiapine 12.5 mg Tabs tablet Commonly known as: SEROQUEL Take 12.5 mg by mouth 2 (two) times daily.   rosuvastatin 10 MG tablet Commonly known as: CRESTOR Take 1 tablet (10 mg total) by mouth daily.   senna-docusate 8.6-50 MG tablet Commonly known as: Senokot-S Take 1 tablet by mouth at bedtime as needed for mild constipation.   timolol 0.5 % ophthalmic solution Commonly known as: TIMOPTIC Place 1 drop into both eyes 2 (two) times daily.   valsartan 320 MG tablet Commonly known as: DIOVAN Take 1 tablet (320 mg total) by mouth daily.        Review of Systems  Constitutional:  Negative for activity change, appetite change, chills, diaphoresis, fatigue, fever and unexpected weight change.  HENT:  Negative for congestion.   Respiratory:  Negative for cough, shortness of breath and wheezing.   Cardiovascular:  Negative for chest pain, palpitations and leg swelling.  Gastrointestinal:  Negative for abdominal distention, abdominal pain, constipation and diarrhea.  Genitourinary:  Positive for frequency (improved). Negative for difficulty urinating and dysuria.  Musculoskeletal:  Positive for gait problem. Negative for arthralgias, back pain, joint swelling and myalgias.  Neurological:  Positive for facial asymmetry, speech difficulty and weakness. Negative for dizziness, tremors, seizures, syncope, light-headedness, numbness and headaches.  Psychiatric/Behavioral:  Positive for agitation, behavioral problems and confusion.    Immunization History  Administered Date(s) Administered   H1N1 08/23/2008   Influenza Split 05/17/2008, 06/02/2009, 04/19/2011   Influenza,inj,quad, With Preservative 04/06/2014   Influenza-Unspecified 05/14/2012, 04/05/2013, 04/13/2015, 05/09/2016, 05/15/2017, 06/30/2018, 04/10/2019, 06/03/2020,  04/11/2021   Moderna Covid-19 Vaccine Bivalent Booster 75yrs & up 05/11/2021   Moderna SARS-COV2 Booster Vaccination 01/16/2021   Moderna Sars-Covid-2 Vaccination 08/10/2019, 09/07/2019, 06/13/2020, 01/16/2021   Pneumococcal Conjugate-13 04/06/2014   Pneumococcal Polysaccharide-23 08/20/2007   Td 11/27/1998, 09/25/2020   Tdap 08/20/2007   Zoster, Live 02/26/2006, 11/14/2016, 05/22/2017   Pertinent  Health Maintenance Due  Topic Date Due   INFLUENZA VACCINE  Completed   DEXA SCAN  Completed   Fall Risk 08/26/2021 08/27/2021 08/27/2021 08/28/2021 08/28/2021  Falls in the past year? - - - - -  Patient Fall Risk Level High fall risk High fall risk High fall risk High fall risk High fall risk   Functional Status Survey:    Vitals:   09/13/21 0913  BP: (!) 160/79  Pulse: 88  Resp: 14  Temp: 98.1 F (36.7 C)  SpO2: 98%  Weight: 142 lb 12.8 oz (64.8 kg)  Height: 5\' 5"  (1.651 m)   Body mass index is 23.76 kg/m. Physical Exam Vitals and nursing note reviewed.  Constitutional:      Appearance: Normal appearance.  Cardiovascular:  Rate and Rhythm: Normal rate and regular rhythm.  Pulmonary:     Effort: Pulmonary effort is normal.     Breath sounds: Normal breath sounds.  Neurological:     Mental Status: She is alert.     Comments: Right sided weakness. Aphasia.   Psychiatric:     Comments: flat    Labs reviewed: Recent Labs    06/26/21 0416 06/27/21 0056 06/28/21 0131 06/29/21 0629 08/24/21 1055 08/25/21 0329 08/26/21 1316 08/27/21 0327 08/27/21 1241 08/28/21 0329 08/30/21 0000 09/03/21 0000 09/07/21 0000  NA 131* 129* 129*   < > 143   < > 128* 130*   < > 132* 135*  --  136*  K 3.6 3.4* 3.6   < > 3.4*   < > 3.7 3.5  --  3.5 4.6  --  4.4  CL 99 97* 97*   < > 109   < > 93* 98  --  99 97*  --  101  CO2 25 24 25    < > 25   < > 27 25  --  25 26*  --  23*  GLUCOSE 113* 118* 114*   < > 130*   < > 117* 105*  --  103*  --   --   --   BUN 11 8 15    < > 27*   < > 16 13   --  19 14  --  9  CREATININE 0.53 0.53 0.61   < > 0.75   < > 0.65 0.39*  --  0.37* 0.4* 0.4* 0.4*  CALCIUM 8.9 8.6* 8.9   < > 8.1*   < > 8.6* 8.0*  --  8.6* 9.1 9.1 8.8  MG 1.8 2.0 2.1  --  1.9  --   --   --   --   --   --   --   --   PHOS 3.0 2.5 3.4  --   --   --   --   --   --   --   --   --   --    < > = values in this interval not displayed.   Recent Labs    06/29/21 0629 07/08/21 1815 08/23/21 1030  AST 21 25 19   ALT 31 32 24  ALKPHOS 41 48 35*  BILITOT 0.7 0.8 0.7  PROT 6.2* 6.7 6.8  ALBUMIN 3.5 3.7 4.3   Recent Labs    06/29/21 0629 07/08/21 1815 07/08/21 1821 08/23/21 1030 08/24/21 0332 08/25/21 0329 08/26/21 0328  WBC 8.9 10.9*   < > 12.2* 8.2 11.6* 10.3  NEUTROABS 5.6 7.7  --  9.4*  --   --   --   HGB 13.2 14.2   < > 14.2 13.6 13.7 15.8*  HCT 39.8 42.4   < > 38.8 37.7 39.5 45.1  MCV 88.8 90.8   < > 81.9 85.1 87.8 86.6  PLT 360 414*   < > 423* 365 349 352   < > = values in this interval not displayed.   Lab Results  Component Value Date   TSH 1.117 06/25/2021   Lab Results  Component Value Date   HGBA1C 5.7 (H) 07/09/2021   Lab Results  Component Value Date   CHOL 120 07/09/2021   HDL 65 07/09/2021   LDLCALC 39 07/09/2021   TRIG 79 07/09/2021   CHOLHDL 1.8 07/09/2021    Significant Diagnostic Results in last 30 days:  LONG TERM MONITOR-LIVE  TELEMETRY (3-14 DAYS)  Result Date: 09/02/2021 Mobile cardiac telemetry 7 days 08/10/2021 - 08/18/2021: Dominant rhythm: Sinus. HR 58-118 bpm. Avg HR 79 bpm, in sinus rhythm. 25 episodes of atrial tachycardia, fastest at 156 bpm for 10 beats, longest for 13 beats at 116 bpm. 1.3% isolated SVE, <1% couplet/triplets. 0 episodes of VT. <1% isolated VE,  Couplets. No atrial fibrillation/atrial flutter/VT/high grade AV block, sinus pause >3sec noted. 0 patient triggered events.    Assessment/Plan  1. Anxiety and depression Mild improvement with seroquel. Just starting Remeron. No s/e. Seems to be doing a little  better. Continue to monitor.    2. Acute delirium With underlying cerebrovascular disease (noted on MRI 12/11/2) from CVA.  Continue seroquel  Continue prn ativan  Family/ staff Communication: nurse.   Labs/tests ordered:   NA

## 2021-09-14 ENCOUNTER — Encounter: Payer: Self-pay | Admitting: Adult Health

## 2021-09-14 DIAGNOSIS — G458 Other transient cerebral ischemic attacks and related syndromes: Secondary | ICD-10-CM | POA: Diagnosis not present

## 2021-09-14 DIAGNOSIS — I69391 Dysphagia following cerebral infarction: Secondary | ICD-10-CM | POA: Diagnosis not present

## 2021-09-14 DIAGNOSIS — R1312 Dysphagia, oropharyngeal phase: Secondary | ICD-10-CM | POA: Diagnosis not present

## 2021-09-14 DIAGNOSIS — R41841 Cognitive communication deficit: Secondary | ICD-10-CM | POA: Diagnosis not present

## 2021-09-14 DIAGNOSIS — E871 Hypo-osmolality and hyponatremia: Secondary | ICD-10-CM | POA: Diagnosis not present

## 2021-09-14 DIAGNOSIS — R278 Other lack of coordination: Secondary | ICD-10-CM | POA: Diagnosis not present

## 2021-09-14 DIAGNOSIS — I69320 Aphasia following cerebral infarction: Secondary | ICD-10-CM | POA: Diagnosis not present

## 2021-09-14 DIAGNOSIS — M6389 Disorders of muscle in diseases classified elsewhere, multiple sites: Secondary | ICD-10-CM | POA: Diagnosis not present

## 2021-09-17 ENCOUNTER — Ambulatory Visit: Payer: Medicare Other | Admitting: Physical Therapy

## 2021-09-17 ENCOUNTER — Encounter: Payer: Self-pay | Admitting: Internal Medicine

## 2021-09-17 ENCOUNTER — Non-Acute Institutional Stay (SKILLED_NURSING_FACILITY): Payer: Medicare Other | Admitting: Internal Medicine

## 2021-09-17 ENCOUNTER — Encounter: Payer: Medicare Other | Admitting: Occupational Therapy

## 2021-09-17 DIAGNOSIS — R2681 Unsteadiness on feet: Secondary | ICD-10-CM | POA: Diagnosis not present

## 2021-09-17 DIAGNOSIS — F419 Anxiety disorder, unspecified: Secondary | ICD-10-CM | POA: Diagnosis not present

## 2021-09-17 DIAGNOSIS — R4189 Other symptoms and signs involving cognitive functions and awareness: Secondary | ICD-10-CM | POA: Diagnosis not present

## 2021-09-17 DIAGNOSIS — R2689 Other abnormalities of gait and mobility: Secondary | ICD-10-CM | POA: Diagnosis not present

## 2021-09-17 DIAGNOSIS — G458 Other transient cerebral ischemic attacks and related syndromes: Secondary | ICD-10-CM | POA: Diagnosis not present

## 2021-09-17 DIAGNOSIS — E871 Hypo-osmolality and hyponatremia: Secondary | ICD-10-CM | POA: Diagnosis not present

## 2021-09-17 DIAGNOSIS — R41841 Cognitive communication deficit: Secondary | ICD-10-CM | POA: Diagnosis not present

## 2021-09-17 DIAGNOSIS — I679 Cerebrovascular disease, unspecified: Secondary | ICD-10-CM

## 2021-09-17 DIAGNOSIS — R4689 Other symptoms and signs involving appearance and behavior: Secondary | ICD-10-CM | POA: Diagnosis not present

## 2021-09-17 DIAGNOSIS — F32A Depression, unspecified: Secondary | ICD-10-CM | POA: Diagnosis not present

## 2021-09-17 DIAGNOSIS — I69391 Dysphagia following cerebral infarction: Secondary | ICD-10-CM | POA: Diagnosis not present

## 2021-09-17 DIAGNOSIS — R1312 Dysphagia, oropharyngeal phase: Secondary | ICD-10-CM | POA: Diagnosis not present

## 2021-09-17 DIAGNOSIS — I69320 Aphasia following cerebral infarction: Secondary | ICD-10-CM | POA: Diagnosis not present

## 2021-09-17 DIAGNOSIS — I10 Essential (primary) hypertension: Secondary | ICD-10-CM

## 2021-09-17 DIAGNOSIS — R278 Other lack of coordination: Secondary | ICD-10-CM | POA: Diagnosis not present

## 2021-09-17 NOTE — Progress Notes (Signed)
Location:   [facility] Room Number: [place] of Service:  SNF [phone]) Provider:  Veleta Miners MD  Kelton Pillar, MD  Patient Care Team: Kelton Pillar, MD as PCP - General (Family Medicine) Jerline Pain, MD as PCP - Cardiology (Cardiology)  Extended Emergency Contact Information Primary Emergency Contact: Scheu,Maurice Address: [address]          [place],  [address] Johnnette Litter of [place] Phone: [phone] Mobile Phone: [phone] Relation: Spouse Secondary Emergency Contact: Macneill,Adam Mobile Phone: [phone] Relation: Son  Code Status:  Full Code Goals of care: Advanced Directive information Advanced Directives 09/17/2021  Does Patient Have a Medical Advance Directive? No  Type of Advance Directive -  Does patient want to make changes to medical advance directive? -  Copy of [place] in Chart? -  Would patient like information on creating a medical advance directive? No - Patient declined     Chief Complaint  Patient presents with   Acute Visit    Follow up    HPI:  Pt is a 81 y.o. female seen today for an acute visit for Follow up as requested by her Husband  Admitted in the hospital from 1/26 to 1/31 for hyponatremia due to celexa   Patient has a history of CVA in 11/27 MRI revealed left basal ganglia infarct Had residual Aphasia and Right sided weakness Was treated with Brilinta for 30 days with Aspirin And then Plavix in 12/28 Cardiology did Monitoring for 1 week .Patient could not finish all 3 weeks   Since being here she has been having Some behavior issue and Cognition issues  including going ot the bathroom 20-25 times.  Washing her hands  Not able to take care of her ADLS without Cueing  Started on Seroquel and Remeron at night Since then patient doing well. Still has some behavior issues especially at night but today in room was smiling. Interacting  answering more. This is all improvement for her Able to walk with mild assist Still not resting through the night  Husband in the room says he does not think he can take care of her in his apartment  Past Medical History:  Diagnosis Date   Breast cancer ([place]) 1993   bilateral mastectomy; no chemo or radiation   Glaucoma    Hyperlipidemia    Hypertension    Past Surgical History:  Procedure Laterality Date   MASTECTOMY Bilateral Waterflow    Allergies  Allergen Reactions   Crab [Shellfish Allergy] Other (See Comments)    From skin test   Fosamax [Alendronate Sodium] Other (See Comments)    Difficult swallowing   Hydrochlorothiazide Other (See Comments)    hyponatremia   Losartan Cough   Raloxifene Other (See Comments)    Unknown reaction   Ace Inhibitors Cough    Reaction to losartan (pt tolerates valsartan)    Allergies as of 09/17/2021       Reactions   Crab [shellfish Allergy] Other (See Comments)   From skin test   Fosamax [alendronate Sodium] Other (See Comments)   Difficult swallowing   Hydrochlorothiazide Other (See Comments)   hyponatremia   Losartan Cough   Raloxifene Other (See Comments)   Unknown reaction   Ace Inhibitors Cough   Reaction to losartan (pt tolerates valsartan)        Medication List  Accurate as of September 17, 2021  2:04 PM. If you have any questions, ask your nurse or doctor.          acetaminophen 325 MG tablet Commonly known as: TYLENOL Take 2 tablets (650 mg total) by mouth every 4 (four) hours as needed for mild pain (or temp > 37.5 C (99.5 F)).   amLODipine 5 MG tablet Commonly known as: NORVASC Take 1 tablet (5 mg total) by mouth daily.   brinzolamide 1 % ophthalmic suspension Commonly known as: AZOPT Place 1 drop into both eyes 2 (two) times daily.   clopidogrel 75 MG tablet Commonly known as: Plavix Plavix 75 mg daily beginning 07/26/2021    LORazepam 0.5 MG tablet Commonly known as: ATIVAN Take 0.25 mg by mouth every 8 (eight) hours.   melatonin 3 MG Tabs tablet 1 tablet at bedtime as needed   mirtazapine 7.5 MG tablet Commonly known as: REMERON Take 7.5 mg by mouth at bedtime.   polyethylene glycol 17 g packet Commonly known as: MIRALAX / GLYCOLAX Take 17 g by mouth daily.   QUEtiapine 12.5 mg Tabs tablet Commonly known as: SEROQUEL Take 12.5 mg by mouth 2 (two) times daily.   rosuvastatin 10 MG tablet Commonly known as: CRESTOR Take 1 tablet (10 mg total) by mouth daily.   senna-docusate 8.6-50 MG tablet Commonly known as: Senokot-S Take 1 tablet by mouth at bedtime as needed for mild constipation.   timolol 0.5 % ophthalmic solution Commonly known as: TIMOPTIC Place 1 drop into both eyes 2 (two) times daily.   valsartan 320 MG tablet Commonly known as: DIOVAN Take 1 tablet (320 mg total) by mouth daily.        Review of Systems  Constitutional:  Negative for activity change and appetite change.  HENT: Negative.    Respiratory:  Negative for cough and shortness of breath.   Cardiovascular:  Negative for leg swelling.  Gastrointestinal:  Negative for constipation.  Genitourinary:  Positive for frequency.  Musculoskeletal:  Positive for gait problem. Negative for arthralgias and myalgias.  Skin: Negative.   Neurological:  Negative for dizziness and weakness.  Psychiatric/Behavioral:  Positive for confusion and sleep disturbance. Negative for dysphoric mood. The patient is nervous/anxious.    Immunization History  Administered Date(s) Administered   H1N1 08/23/2008   Influenza Split 05/17/2008, 06/02/2009, 04/19/2011   Influenza,inj,quad, With Preservative 04/06/2014   Influenza-Unspecified 05/14/2012, 04/05/2013, 04/13/2015, 05/09/2016, 05/15/2017, 06/30/2018, 04/10/2019, 06/03/2020, 04/11/2021   Moderna Covid-19 Vaccine Bivalent Booster 70yrs & up 05/11/2021   Moderna SARS-COV2 Booster  Vaccination 01/16/2021   Moderna Sars-Covid-2 Vaccination 08/10/2019, 09/07/2019, 06/13/2020, 01/16/2021   Pneumococcal Conjugate-13 04/06/2014   Pneumococcal Polysaccharide-23 08/20/2007   Td 11/27/1998, 09/25/2020   Tdap 08/20/2007   Zoster, Live 02/26/2006, 11/14/2016, 05/22/2017   Pertinent  Health Maintenance Due  Topic Date Due   INFLUENZA VACCINE  Completed   DEXA SCAN  Completed   Fall Risk 08/26/2021 08/27/2021 08/27/2021 08/28/2021 08/28/2021  Falls in the past year? - - - - -  Patient Fall Risk Level High fall risk High fall risk High fall risk High fall risk High fall risk   Functional Status Survey:    Vitals:   09/17/21 1347  BP: 123/71  Pulse: 86  Resp: 14  Temp: 98.4 F (36.9 C)  SpO2: 96%  Weight: 142 lb 12.8 oz (64.8 kg)  Height: 5\' 5"  (1.651 m)   Body mass index is 23.76 kg/m. Physical Exam Vitals reviewed.  Constitutional:  Appearance: Normal appearance.  HENT:     Head: Normocephalic.     Nose: Nose normal.     Mouth/Throat:     Mouth: Mucous membranes are moist.     Pharynx: Oropharynx is clear.  Eyes:     Pupils: Pupils are equal, round, and reactive to light.  Cardiovascular:     Rate and Rhythm: Normal rate and regular rhythm.     Pulses: Normal pulses.     Heart sounds: Normal heart sounds. No murmur heard. Pulmonary:     Effort: Pulmonary effort is normal.     Breath sounds: Normal breath sounds.  Abdominal:     General: Abdomen is flat. Bowel sounds are normal.     Palpations: Abdomen is soft.  Musculoskeletal:        General: No swelling.     Cervical back: Neck supple.  Skin:    General: Skin is warm.  Neurological:     General: No focal deficit present.     Mental Status: She is alert.     Comments: Walking with her walker Aphasia seems more improved today  Psychiatric:        Mood and Affect: Mood normal.        Thought Content: Thought content normal.    Labs reviewed: Recent Labs    06/26/21 0416 06/27/21 0056  06/28/21 0131 06/29/21 0629 08/24/21 1055 08/25/21 0329 08/26/21 1316 08/27/21 0327 08/27/21 1241 08/28/21 0329 08/30/21 0000 09/03/21 0000 09/07/21 0000  NA 131* 129* 129*   < > 143   < > 128* 130*   < > 132* 135*  --  136*  K 3.6 3.4* 3.6   < > 3.4*   < > 3.7 3.5  --  3.5 4.6  --  4.4  CL 99 97* 97*   < > 109   < > 93* 98  --  99 97*  --  101  CO2 25 24 25    < > 25   < > 27 25  --  25 26*  --  23*  GLUCOSE 113* 118* 114*   < > 130*   < > 117* 105*  --  103*  --   --   --   BUN 11 8 15    < > 27*   < > 16 13  --  19 14  --  9  CREATININE 0.53 0.53 0.61   < > 0.75   < > 0.65 0.39*  --  0.37* 0.4* 0.4* 0.4*  CALCIUM 8.9 8.6* 8.9   < > 8.1*   < > 8.6* 8.0*  --  8.6* 9.1 9.1 8.8  MG 1.8 2.0 2.1  --  1.9  --   --   --   --   --   --   --   --   PHOS 3.0 2.5 3.4  --   --   --   --   --   --   --   --   --   --    < > = values in this interval not displayed.   Recent Labs    06/29/21 0629 07/08/21 1815 08/23/21 1030  AST 21 25 19   ALT 31 32 24  ALKPHOS 41 48 35*  BILITOT 0.7 0.8 0.7  PROT 6.2* 6.7 6.8  ALBUMIN 3.5 3.7 4.3   Recent Labs    06/29/21 0629 07/08/21 1815 07/08/21 1821 08/23/21 1030 08/24/21 0332 08/25/21 0329 08/26/21 2694  WBC 8.9 10.9*   < > 12.2* 8.2 11.6* 10.3  NEUTROABS 5.6 7.7  --  9.4*  --   --   --   HGB 13.2 14.2   < > 14.2 13.6 13.7 15.8*  HCT 39.8 42.4   < > 38.8 37.7 39.5 45.1  MCV 88.8 90.8   < > 81.9 85.1 87.8 86.6  PLT 360 414*   < > 423* 365 349 352   < > = values in this interval not displayed.   Lab Results  Component Value Date   TSH 1.117 06/25/2021   Lab Results  Component Value Date   HGBA1C 5.7 (H) 07/09/2021   Lab Results  Component Value Date   CHOL 120 07/09/2021   HDL 65 07/09/2021   LDLCALC 39 07/09/2021   TRIG 79 07/09/2021   CHOLHDL 1.8 07/09/2021    Significant Diagnostic Results in last 30 days:  LONG TERM MONITOR-LIVE TELEMETRY (3-14 DAYS)  Result Date: 09/02/2021 Mobile cardiac telemetry 7 days 08/10/2021 -  08/18/2021: Dominant rhythm: Sinus. HR 58-118 bpm. Avg HR 79 bpm, in sinus rhythm. 25 episodes of atrial tachycardia, fastest at 156 bpm for 10 beats, longest for 13 beats at 116 bpm. 1.3% isolated SVE, <1% couplet/triplets. 0 episodes of VT. <1% isolated VE,  Couplets. No atrial fibrillation/atrial flutter/VT/high grade AV block, sinus pause >3sec noted. 0 patient triggered events.    Assessment/Plan 1. Cognitive and behavioral changes Doing well with low dose of Seroquel Also Ativan PRN D/w Speech and Nurses she is having behavior issues due to her Cognition  Also Late effect of her CVA Unable to do her ADLS witout Cueing   2. Anxiety and depression Change Remeron to 15 mg HS to help with sleeping at night and her mood  3. Cerebrovascular disease Infarction of left basal ganglia (HCC) On Statin and Plavix Cardiac Tele done only for one week was negative for A Fib Has Follow up with Neurology  4. Hyponatremia Off SSRI and on Fluid restriction  5. Essential hypertension On Diovan and Norvasc 6 Mixed hyperlipidemia Statin 7 Urinary frequency Was  treated for UTI  Now seems more Behavior issue  Family/ staff Communication:   Labs/tests ordered:

## 2021-09-18 DIAGNOSIS — R2689 Other abnormalities of gait and mobility: Secondary | ICD-10-CM | POA: Diagnosis not present

## 2021-09-18 DIAGNOSIS — R2681 Unsteadiness on feet: Secondary | ICD-10-CM | POA: Diagnosis not present

## 2021-09-18 DIAGNOSIS — E871 Hypo-osmolality and hyponatremia: Secondary | ICD-10-CM | POA: Diagnosis not present

## 2021-09-18 DIAGNOSIS — M6389 Disorders of muscle in diseases classified elsewhere, multiple sites: Secondary | ICD-10-CM | POA: Diagnosis not present

## 2021-09-18 DIAGNOSIS — G458 Other transient cerebral ischemic attacks and related syndromes: Secondary | ICD-10-CM | POA: Diagnosis not present

## 2021-09-18 DIAGNOSIS — R41841 Cognitive communication deficit: Secondary | ICD-10-CM | POA: Diagnosis not present

## 2021-09-18 DIAGNOSIS — R1312 Dysphagia, oropharyngeal phase: Secondary | ICD-10-CM | POA: Diagnosis not present

## 2021-09-18 DIAGNOSIS — I69391 Dysphagia following cerebral infarction: Secondary | ICD-10-CM | POA: Diagnosis not present

## 2021-09-18 DIAGNOSIS — I69320 Aphasia following cerebral infarction: Secondary | ICD-10-CM | POA: Diagnosis not present

## 2021-09-18 DIAGNOSIS — R278 Other lack of coordination: Secondary | ICD-10-CM | POA: Diagnosis not present

## 2021-09-19 ENCOUNTER — Ambulatory Visit (INDEPENDENT_AMBULATORY_CARE_PROVIDER_SITE_OTHER): Payer: Medicare Other | Admitting: Neurology

## 2021-09-19 ENCOUNTER — Encounter: Payer: Medicare Other | Admitting: Occupational Therapy

## 2021-09-19 ENCOUNTER — Encounter: Payer: Self-pay | Admitting: Neurology

## 2021-09-19 ENCOUNTER — Ambulatory Visit: Payer: Medicare Other | Admitting: Physical Therapy

## 2021-09-19 VITALS — BP 106/66 | HR 95 | Ht 65.0 in | Wt 142.8 lb

## 2021-09-19 DIAGNOSIS — R1312 Dysphagia, oropharyngeal phase: Secondary | ICD-10-CM | POA: Diagnosis not present

## 2021-09-19 DIAGNOSIS — R410 Disorientation, unspecified: Secondary | ICD-10-CM

## 2021-09-19 DIAGNOSIS — F32A Depression, unspecified: Secondary | ICD-10-CM | POA: Diagnosis not present

## 2021-09-19 DIAGNOSIS — I639 Cerebral infarction, unspecified: Secondary | ICD-10-CM | POA: Diagnosis not present

## 2021-09-19 DIAGNOSIS — I69320 Aphasia following cerebral infarction: Secondary | ICD-10-CM | POA: Diagnosis not present

## 2021-09-19 DIAGNOSIS — E7800 Pure hypercholesterolemia, unspecified: Secondary | ICD-10-CM | POA: Insufficient documentation

## 2021-09-19 DIAGNOSIS — R32 Unspecified urinary incontinence: Secondary | ICD-10-CM | POA: Insufficient documentation

## 2021-09-19 DIAGNOSIS — M810 Age-related osteoporosis without current pathological fracture: Secondary | ICD-10-CM | POA: Insufficient documentation

## 2021-09-19 DIAGNOSIS — E559 Vitamin D deficiency, unspecified: Secondary | ICD-10-CM | POA: Insufficient documentation

## 2021-09-19 DIAGNOSIS — I6990 Unspecified sequelae of unspecified cerebrovascular disease: Secondary | ICD-10-CM | POA: Insufficient documentation

## 2021-09-19 DIAGNOSIS — R41841 Cognitive communication deficit: Secondary | ICD-10-CM | POA: Diagnosis not present

## 2021-09-19 DIAGNOSIS — M1990 Unspecified osteoarthritis, unspecified site: Secondary | ICD-10-CM | POA: Insufficient documentation

## 2021-09-19 DIAGNOSIS — I69391 Dysphagia following cerebral infarction: Secondary | ICD-10-CM | POA: Diagnosis not present

## 2021-09-19 DIAGNOSIS — H9190 Unspecified hearing loss, unspecified ear: Secondary | ICD-10-CM | POA: Insufficient documentation

## 2021-09-19 NOTE — Patient Instructions (Addendum)
Continue current medications Continue with physical and occupation therapy  Follow up with Dr Lyndel Safe, can consider increase in Mirtazapine  Follow up in 3 months, at that time we will perform a memory exam. Today visit was for post stroke

## 2021-09-19 NOTE — Progress Notes (Signed)
[facility]  PATIENT: Sandra Becker DOB: 08-05-1940  REQUESTING CLINICIAN: Rosalin Hawking, MD HISTORY FROM: Patient but mostly husband  REASON FOR VISIT: Hospital follow up/stroke    HISTORICAL  CHIEF COMPLAINT:  Chief Complaint  Patient presents with   Hospitalization Follow-up    Rm 12. Accompanied by husband. Hospital f/u for stroke.    HISTORY OF PRESENT ILLNESS:  This is a 81 year old woman with past medical history of hypertension, hyperlipidemia, TIA who was admitted in the hospital in November for acute left basal ganglia stroke.  Patient initially presented with slurred speech and right-sided weakness.  MRI of her brain showed a left basal ganglia infarct, etiology likely small vessel disease but cannot rule out cardioembolic source.  Echocardiogram was done showing EF 60 to 65%, no documented A-fib.  Her EEG was normal no seizures.  Her stroke labs show LDL of 69 and hemoglobin A1c of 6.0.  She was recommended to start aspirin and Brilinta for 30 days then to continue with Plavix thereafter.  She was also recommended for a cardiac monitor, no evidence of atrial fibrillation.  After the initial hospitalization she was sent to rehab.   She presented to the ED a couple weeks later after slumping over, MRI did not show any acute stroke but show extension of her previous basal ganglia, corona radiata stroke.  Her episode was felt to be recrudescence of stroke symptoms. In January 26, she presented to the hospital for generalized weakness found to be hyponatremic to 116.  Husband said that they felt the hyponatremia was secondary to a her antidepressant Celexa which was switched to mirtazapine.  She was also noted to have a UTI which was treated with antibiotics.  Husband noted that patient now stays at [facility] rehab, she is doing physical therapy..  There is still some issue with confusion and abnormal behavior.  She left the room 1 night and start  wandering, patient does not remember the event.  Husband said the patient get fixed on matter and wont let them go.  She has a propensity to go to the bathroom multiple times during the day even when she does not need to use it and there is also short-term confusion and some worsening in memory.  Per patient she thinks she is doing all right he does not have any pain currently has no complaint and feels that her speech is fair.   OTHER MEDICAL CONDITIONS: Hypertension, hyperlipidemia    REVIEW OF SYSTEMS: Full 14 system review of systems performed and negative with exception of: as noted in the HPI  ALLERGIES: Allergies  Allergen Reactions   Crab [Shellfish Allergy] Other (See Comments)    From skin test   Fosamax [Alendronate Sodium] Other (See Comments)    Difficult swallowing   Hydrochlorothiazide Other (See Comments)    hyponatremia   Losartan Cough   Raloxifene Other (See Comments)    Unknown reaction   Ace Inhibitors Cough    Reaction to losartan (pt tolerates valsartan)    HOME MEDICATIONS: Outpatient Medications Prior to Visit  Medication Sig Dispense Refill   acetaminophen (TYLENOL) 325 MG tablet Take 2 tablets (650 mg total) by mouth every 4 (four) hours as needed for mild pain (or temp > 37.5 C (99.5 F)).     amLODipine (NORVASC) 5 MG tablet Take 1 tablet (5 mg total) by mouth daily. 30 tablet 1   brinzolamide (AZOPT) 1 % ophthalmic suspension Place 1 drop into both eyes 2 (two)  times daily.      clopidogrel (PLAVIX) 75 MG tablet Plavix 75 mg daily beginning 07/26/2021 30 tablet 1   LORazepam (ATIVAN) 0.5 MG tablet Take 0.25 mg by mouth every 8 (eight) hours.     melatonin 3 MG TABS tablet 1 tablet at bedtime as needed     mirtazapine (REMERON) 7.5 MG tablet Take 15 mg by mouth at bedtime.     polyethylene glycol (MIRALAX / GLYCOLAX) 17 g packet Take 17 g by mouth daily.     QUEtiapine (SEROQUEL) 12.5 mg TABS tablet Take 12.5 mg by mouth 2 (two) times daily.      rosuvastatin (CRESTOR) 10 MG tablet Take 1 tablet (10 mg total) by mouth daily. 30 tablet 0   senna-docusate (SENOKOT-S) 8.6-50 MG tablet Take 1 tablet by mouth at bedtime as needed for mild constipation. 10 tablet 1   timolol (TIMOPTIC) 0.5 % ophthalmic solution Place 1 drop into both eyes 2 (two) times daily.     valsartan (DIOVAN) 320 MG tablet Take 1 tablet (320 mg total) by mouth daily. 30 tablet 0   No facility-administered medications prior to visit.    PAST MEDICAL HISTORY: Past Medical History:  Diagnosis Date   Breast cancer ([place]) 1993   bilateral mastectomy; no chemo or radiation   Glaucoma    Hyperlipidemia    Hypertension     PAST SURGICAL HISTORY: Past Surgical History:  Procedure Laterality Date   MASTECTOMY Bilateral 1993   TONSILLECTOMY AND ADENOIDECTOMY  1947   TUBAL LIGATION  1974    FAMILY HISTORY: Family History  Problem Relation Age of Onset   Breast cancer Mother    Colon cancer Neg Hx     SOCIAL HISTORY: Social History   Socioeconomic History   Marital status: Married    Spouse name: Not on file   Number of children: 2   Years of education: Not on file   Highest education level: Not on file  Occupational History   Not on file  Tobacco Use   Smoking status: Former    Packs/day: 1.00    Years: 5.00    Pack years: 5.00    Types: Cigarettes    Quit date: 07/30/1963    Years since quitting: 58.1   Smokeless tobacco: Never  Vaping Use   Vaping Use: Never used  Substance and Sexual Activity   Alcohol use: Not Currently    Alcohol/week: 21.0 standard drinks    Types: 21 Glasses of wine per week    Comment: occ   Drug use: No   Sexual activity: Not Currently  Other Topics Concern   Not on file  Social History Narrative   Not on file   Social Determinants of Health   Financial Resource Strain: Not on file  Food Insecurity: Not on file  Transportation Needs: Not on file  Physical Activity: Not on file  Stress: Not on file  Social  Connections: Not on file  Intimate Partner Violence: Not on file    PHYSICAL EXAM  GENERAL EXAM/CONSTITUTIONAL: Vitals:  Vitals:   09/19/21 1113  BP: 106/66  Pulse: 95  Weight: 142 lb 12.8 oz (64.8 kg)  Height: 5\' 5"  (1.651 m)   Body mass index is 23.76 kg/m. Wt Readings from Last 3 Encounters:  09/19/21 142 lb 12.8 oz (64.8 kg)  09/17/21 142 lb 12.8 oz (64.8 kg)  09/13/21 142 lb 12.8 oz (64.8 kg)   Patient is in no distress; well developed, nourished and groomed; neck  is supple  CARDIOVASCULAR: Examination of carotid arteries is normal; no carotid bruits Regular rate and rhythm, no murmurs Examination of peripheral vascular system by observation and palpation is normal  EYES: Pupils round and reactive to light, Visual fields full to confrontation, Extraocular movements intacts,   MUSCULOSKELETAL: Gait, strength, tone, movements noted in Neurologic exam below  NEUROLOGIC: MENTAL STATUS:  No flowsheet data found. awake, alert, oriented to person  She has a flat affect, not engaged in the visit  normal attention and concentration Follow simple commands   CRANIAL NERVE:  2nd, 3rd, 4th, 6th - pupils equal and reactive to light, visual fields full to confrontation, extraocular muscles intact, no nystagmus 5th - facial sensation symmetric 7th - facial strength symmetric 8th - hearing intact 9th - palate elevates symmetrically, uvula midline 11th - shoulder shrug symmetric 12th - tongue protrusion midline  MOTOR:  normal bulk and tone, full strength in the BUE, BLE but there is subtle right upper extremity weakness, mild pronator on the right arm and satelliting around the right hand.   SENSORY:  normal and symmetric to light touch   COORDINATION:  finger-nose-finger, fine finger movements normal  REFLEXES:  deep tendon reflexes present and symmetric  GAIT/STATION:  Deferred      DIAGNOSTIC DATA (LABS, IMAGING, TESTING) - I reviewed patient records,  labs, notes, testing and imaging myself where available.  Lab Results  Component Value Date   WBC 10.3 08/26/2021   HGB 15.8 (H) 08/26/2021   HCT 45.1 08/26/2021   MCV 86.6 08/26/2021   PLT 352 08/26/2021      Component Value Date/Time   NA 136 (A) 09/07/2021 0000   K 4.4 09/07/2021 0000   CL 101 09/07/2021 0000   CO2 23 (A) 09/07/2021 0000   GLUCOSE 103 (H) 08/28/2021 0329   BUN 9 09/07/2021 0000   CREATININE 0.4 (A) 09/07/2021 0000   CREATININE 0.37 (L) 08/28/2021 0329   CALCIUM 8.8 09/07/2021 0000   PROT 6.8 08/23/2021 1030   ALBUMIN 4.3 08/23/2021 1030   AST 19 08/23/2021 1030   ALT 24 08/23/2021 1030   ALKPHOS 35 (L) 08/23/2021 1030   BILITOT 0.7 08/23/2021 1030   GFRNONAA >60 08/28/2021 0329   GFRAA >60 09/25/2018 1429   Lab Results  Component Value Date   CHOL 120 07/09/2021   HDL 65 07/09/2021   LDLCALC 39 07/09/2021   TRIG 79 07/09/2021   CHOLHDL 1.8 07/09/2021   Lab Results  Component Value Date   HGBA1C 5.7 (H) 07/09/2021   No results found for: [id number] Lab Results  Component Value Date   TSH 1.117 06/25/2021    MRI Brain without contrast  1. Interval expansion of previously identified left basal ganglia/corona radiata infarct, now measuring up to 3.6 cm. No associated hemorrhage or significant regional mass effect. 2. No other acute intracranial abnormality. 3. Underlying age-related cerebral atrophy with moderate chronic microvascular ischemic disease.   CTA Head and Neck 07/08/2022 1. Negative for intracranial large vessel occlusion 2. 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 3. Minimal atherosclerotic disease in the carotid arteries. No carotid or vertebral artery stenosis in the neck.    ASSESSMENT AND PLAN  81 y.o. year old female with vascular risk factor including hypertension, hyperlipidemia, recently diagnosed with a left basal ganglia/corona  radiata stroke who is presenting for poststroke follow-up.  Since her stroke patient had multiple ED visits, for recrudescence of her symptoms, hyponatremia, and currently  she is a resident at the [facility].  She completed her course of aspirin and Brilinta, currently she is on Plavix 75 mg.  On exam she appeared to be withdrawn and has a flat affect, she is depressed, she was on Celexa but due to hyponatremia it was switched to mirtazapine.  I believe her baseline depression is likely worsened by her recent stroke. For stroke prevention we will continue Plavix, continue Norvasc, and Crestor and continue physical therapy.  We will also recommend to continue all other medications.  At the end of the visit husband noted about her memory problem, I have reviewed a note from Dr. Lyndel Safe mentioning evaluation for memory.  I informed patient that this referral was made initially by Dr. Erlinda Hong for poststroke hospital follow-up.  I will be happy to see her in a few month to do a more focused memory evaluation.  I have explained to the husband that her current condition is likely from her depression rather than dementia.  Husband noted prior to her stroke in November she was active and independent therefore I do not expect her to develop dementia in 3 to 47-months.  If her mood is not improving, can consider increasing the mirtazapine or having patient seen by a psychiatrist.  This was explained to the husband and he is comfortable with plan.  He reports he will bring him back in a few months.  I have also informed him that I will fax my note to Dr. Lyndel Safe.   Follow-up in 87-months.    1. Infarction of left basal ganglia (HCC)   2. Confusion   3. Depression, unspecified depression type      Patient Instructions  Continue current medications Continue with physical and occupation therapy  Follow up with Dr Lyndel Safe, can consider increase in Mirtazapine  Follow up in 3 months, at that time we will perform a memory exam.  Today visit was for post stroke   No orders of the defined types were placed in this encounter.   No orders of the defined types were placed in this encounter.   Return in about 3 months (around 12/17/2021).  I have spent a total of 75 minutes dedicated to this patient today, preparing to see patient, examining the patient, ordering tests and/or medications, and counseling the patient including review of tests; performing a medically appropriate examination and evaluation; ordering medication, test, and procedures; counseling and educating the patient/family/caregiver; independent independently interpreting result and communicating results to the family/patient/caregiver; and documenting clinical information in the electronic medical record.   Alric Ran, MD 09/19/2021, 6:29 PM  [facility] [address] [place], [place] [address] [phone]

## 2021-09-20 DIAGNOSIS — I69391 Dysphagia following cerebral infarction: Secondary | ICD-10-CM | POA: Diagnosis not present

## 2021-09-20 DIAGNOSIS — M6389 Disorders of muscle in diseases classified elsewhere, multiple sites: Secondary | ICD-10-CM | POA: Diagnosis not present

## 2021-09-20 DIAGNOSIS — R2689 Other abnormalities of gait and mobility: Secondary | ICD-10-CM | POA: Diagnosis not present

## 2021-09-20 DIAGNOSIS — R278 Other lack of coordination: Secondary | ICD-10-CM | POA: Diagnosis not present

## 2021-09-20 DIAGNOSIS — E871 Hypo-osmolality and hyponatremia: Secondary | ICD-10-CM | POA: Diagnosis not present

## 2021-09-20 DIAGNOSIS — R1312 Dysphagia, oropharyngeal phase: Secondary | ICD-10-CM | POA: Diagnosis not present

## 2021-09-20 DIAGNOSIS — R41841 Cognitive communication deficit: Secondary | ICD-10-CM | POA: Diagnosis not present

## 2021-09-20 DIAGNOSIS — G458 Other transient cerebral ischemic attacks and related syndromes: Secondary | ICD-10-CM | POA: Diagnosis not present

## 2021-09-20 DIAGNOSIS — I69320 Aphasia following cerebral infarction: Secondary | ICD-10-CM | POA: Diagnosis not present

## 2021-09-20 DIAGNOSIS — R2681 Unsteadiness on feet: Secondary | ICD-10-CM | POA: Diagnosis not present

## 2021-09-21 DIAGNOSIS — R2689 Other abnormalities of gait and mobility: Secondary | ICD-10-CM | POA: Diagnosis not present

## 2021-09-21 DIAGNOSIS — R1312 Dysphagia, oropharyngeal phase: Secondary | ICD-10-CM | POA: Diagnosis not present

## 2021-09-21 DIAGNOSIS — R2681 Unsteadiness on feet: Secondary | ICD-10-CM | POA: Diagnosis not present

## 2021-09-21 DIAGNOSIS — M6389 Disorders of muscle in diseases classified elsewhere, multiple sites: Secondary | ICD-10-CM | POA: Diagnosis not present

## 2021-09-21 DIAGNOSIS — E871 Hypo-osmolality and hyponatremia: Secondary | ICD-10-CM | POA: Diagnosis not present

## 2021-09-21 DIAGNOSIS — R278 Other lack of coordination: Secondary | ICD-10-CM | POA: Diagnosis not present

## 2021-09-21 DIAGNOSIS — R41841 Cognitive communication deficit: Secondary | ICD-10-CM | POA: Diagnosis not present

## 2021-09-21 DIAGNOSIS — I69391 Dysphagia following cerebral infarction: Secondary | ICD-10-CM | POA: Diagnosis not present

## 2021-09-21 DIAGNOSIS — G458 Other transient cerebral ischemic attacks and related syndromes: Secondary | ICD-10-CM | POA: Diagnosis not present

## 2021-09-21 DIAGNOSIS — I69320 Aphasia following cerebral infarction: Secondary | ICD-10-CM | POA: Diagnosis not present

## 2021-09-24 DIAGNOSIS — G458 Other transient cerebral ischemic attacks and related syndromes: Secondary | ICD-10-CM | POA: Diagnosis not present

## 2021-09-24 DIAGNOSIS — I69391 Dysphagia following cerebral infarction: Secondary | ICD-10-CM | POA: Diagnosis not present

## 2021-09-24 DIAGNOSIS — R2681 Unsteadiness on feet: Secondary | ICD-10-CM | POA: Diagnosis not present

## 2021-09-24 DIAGNOSIS — R2689 Other abnormalities of gait and mobility: Secondary | ICD-10-CM | POA: Diagnosis not present

## 2021-09-24 DIAGNOSIS — M6389 Disorders of muscle in diseases classified elsewhere, multiple sites: Secondary | ICD-10-CM | POA: Diagnosis not present

## 2021-09-24 DIAGNOSIS — I69320 Aphasia following cerebral infarction: Secondary | ICD-10-CM | POA: Diagnosis not present

## 2021-09-24 DIAGNOSIS — R278 Other lack of coordination: Secondary | ICD-10-CM | POA: Diagnosis not present

## 2021-09-24 DIAGNOSIS — R41841 Cognitive communication deficit: Secondary | ICD-10-CM | POA: Diagnosis not present

## 2021-09-24 DIAGNOSIS — E871 Hypo-osmolality and hyponatremia: Secondary | ICD-10-CM | POA: Diagnosis not present

## 2021-09-24 DIAGNOSIS — R1312 Dysphagia, oropharyngeal phase: Secondary | ICD-10-CM | POA: Diagnosis not present

## 2021-09-25 DIAGNOSIS — R278 Other lack of coordination: Secondary | ICD-10-CM | POA: Diagnosis not present

## 2021-09-25 DIAGNOSIS — I69391 Dysphagia following cerebral infarction: Secondary | ICD-10-CM | POA: Diagnosis not present

## 2021-09-25 DIAGNOSIS — E871 Hypo-osmolality and hyponatremia: Secondary | ICD-10-CM | POA: Diagnosis not present

## 2021-09-25 DIAGNOSIS — R2681 Unsteadiness on feet: Secondary | ICD-10-CM | POA: Diagnosis not present

## 2021-09-25 DIAGNOSIS — G458 Other transient cerebral ischemic attacks and related syndromes: Secondary | ICD-10-CM | POA: Diagnosis not present

## 2021-09-25 DIAGNOSIS — M6389 Disorders of muscle in diseases classified elsewhere, multiple sites: Secondary | ICD-10-CM | POA: Diagnosis not present

## 2021-09-25 DIAGNOSIS — R1312 Dysphagia, oropharyngeal phase: Secondary | ICD-10-CM | POA: Diagnosis not present

## 2021-09-25 DIAGNOSIS — I69320 Aphasia following cerebral infarction: Secondary | ICD-10-CM | POA: Diagnosis not present

## 2021-09-25 DIAGNOSIS — R41841 Cognitive communication deficit: Secondary | ICD-10-CM | POA: Diagnosis not present

## 2021-09-25 DIAGNOSIS — R2689 Other abnormalities of gait and mobility: Secondary | ICD-10-CM | POA: Diagnosis not present

## 2021-09-26 DIAGNOSIS — I69391 Dysphagia following cerebral infarction: Secondary | ICD-10-CM | POA: Diagnosis not present

## 2021-09-26 DIAGNOSIS — R41841 Cognitive communication deficit: Secondary | ICD-10-CM | POA: Diagnosis not present

## 2021-09-26 DIAGNOSIS — R278 Other lack of coordination: Secondary | ICD-10-CM | POA: Diagnosis not present

## 2021-09-26 DIAGNOSIS — R1312 Dysphagia, oropharyngeal phase: Secondary | ICD-10-CM | POA: Diagnosis not present

## 2021-09-26 DIAGNOSIS — E871 Hypo-osmolality and hyponatremia: Secondary | ICD-10-CM | POA: Diagnosis not present

## 2021-09-26 DIAGNOSIS — I69320 Aphasia following cerebral infarction: Secondary | ICD-10-CM | POA: Diagnosis not present

## 2021-09-26 DIAGNOSIS — M6389 Disorders of muscle in diseases classified elsewhere, multiple sites: Secondary | ICD-10-CM | POA: Diagnosis not present

## 2021-09-26 DIAGNOSIS — G458 Other transient cerebral ischemic attacks and related syndromes: Secondary | ICD-10-CM | POA: Diagnosis not present

## 2021-09-27 DIAGNOSIS — R2689 Other abnormalities of gait and mobility: Secondary | ICD-10-CM | POA: Diagnosis not present

## 2021-09-27 DIAGNOSIS — R2681 Unsteadiness on feet: Secondary | ICD-10-CM | POA: Diagnosis not present

## 2021-09-27 DIAGNOSIS — M6389 Disorders of muscle in diseases classified elsewhere, multiple sites: Secondary | ICD-10-CM | POA: Diagnosis not present

## 2021-09-27 DIAGNOSIS — I69320 Aphasia following cerebral infarction: Secondary | ICD-10-CM | POA: Diagnosis not present

## 2021-09-27 DIAGNOSIS — G458 Other transient cerebral ischemic attacks and related syndromes: Secondary | ICD-10-CM | POA: Diagnosis not present

## 2021-09-27 DIAGNOSIS — R278 Other lack of coordination: Secondary | ICD-10-CM | POA: Diagnosis not present

## 2021-09-27 DIAGNOSIS — R1312 Dysphagia, oropharyngeal phase: Secondary | ICD-10-CM | POA: Diagnosis not present

## 2021-09-27 DIAGNOSIS — I69391 Dysphagia following cerebral infarction: Secondary | ICD-10-CM | POA: Diagnosis not present

## 2021-09-27 DIAGNOSIS — R41841 Cognitive communication deficit: Secondary | ICD-10-CM | POA: Diagnosis not present

## 2021-09-27 DIAGNOSIS — E871 Hypo-osmolality and hyponatremia: Secondary | ICD-10-CM | POA: Diagnosis not present

## 2021-09-28 DIAGNOSIS — R41841 Cognitive communication deficit: Secondary | ICD-10-CM | POA: Diagnosis not present

## 2021-09-28 DIAGNOSIS — I69320 Aphasia following cerebral infarction: Secondary | ICD-10-CM | POA: Diagnosis not present

## 2021-09-28 DIAGNOSIS — I69391 Dysphagia following cerebral infarction: Secondary | ICD-10-CM | POA: Diagnosis not present

## 2021-09-28 DIAGNOSIS — R1312 Dysphagia, oropharyngeal phase: Secondary | ICD-10-CM | POA: Diagnosis not present

## 2021-10-01 ENCOUNTER — Non-Acute Institutional Stay (SKILLED_NURSING_FACILITY): Payer: Medicare Other | Admitting: Internal Medicine

## 2021-10-01 ENCOUNTER — Encounter: Payer: Self-pay | Admitting: Internal Medicine

## 2021-10-01 DIAGNOSIS — R4189 Other symptoms and signs involving cognitive functions and awareness: Secondary | ICD-10-CM | POA: Diagnosis not present

## 2021-10-01 DIAGNOSIS — R278 Other lack of coordination: Secondary | ICD-10-CM | POA: Diagnosis not present

## 2021-10-01 DIAGNOSIS — I69391 Dysphagia following cerebral infarction: Secondary | ICD-10-CM | POA: Diagnosis not present

## 2021-10-01 DIAGNOSIS — I69320 Aphasia following cerebral infarction: Secondary | ICD-10-CM | POA: Diagnosis not present

## 2021-10-01 DIAGNOSIS — R41841 Cognitive communication deficit: Secondary | ICD-10-CM | POA: Diagnosis not present

## 2021-10-01 DIAGNOSIS — E782 Mixed hyperlipidemia: Secondary | ICD-10-CM | POA: Diagnosis not present

## 2021-10-01 DIAGNOSIS — I679 Cerebrovascular disease, unspecified: Secondary | ICD-10-CM | POA: Diagnosis not present

## 2021-10-01 DIAGNOSIS — F32A Depression, unspecified: Secondary | ICD-10-CM | POA: Diagnosis not present

## 2021-10-01 DIAGNOSIS — E871 Hypo-osmolality and hyponatremia: Secondary | ICD-10-CM

## 2021-10-01 DIAGNOSIS — I10 Essential (primary) hypertension: Secondary | ICD-10-CM

## 2021-10-01 DIAGNOSIS — R350 Frequency of micturition: Secondary | ICD-10-CM | POA: Diagnosis not present

## 2021-10-01 DIAGNOSIS — R4689 Other symptoms and signs involving appearance and behavior: Secondary | ICD-10-CM | POA: Diagnosis not present

## 2021-10-01 DIAGNOSIS — R2689 Other abnormalities of gait and mobility: Secondary | ICD-10-CM | POA: Diagnosis not present

## 2021-10-01 DIAGNOSIS — G458 Other transient cerebral ischemic attacks and related syndromes: Secondary | ICD-10-CM | POA: Diagnosis not present

## 2021-10-01 DIAGNOSIS — F419 Anxiety disorder, unspecified: Secondary | ICD-10-CM

## 2021-10-01 DIAGNOSIS — R2681 Unsteadiness on feet: Secondary | ICD-10-CM | POA: Diagnosis not present

## 2021-10-01 DIAGNOSIS — R1312 Dysphagia, oropharyngeal phase: Secondary | ICD-10-CM | POA: Diagnosis not present

## 2021-10-01 NOTE — Progress Notes (Signed)
Location:   [facility] Room Number: [place] of Service:  SNF [phone]) Provider:  Veleta Miners MD  Kelton Pillar, MD  Patient Care Team: Kelton Pillar, MD as PCP - General (Family Medicine) Jerline Pain, MD as PCP - Cardiology (Cardiology)  Extended Emergency Contact Information Primary Emergency Contact: Ask,Maurice Address: [address]          [place],  [address] [place] of [place] Phone: [phone] Mobile Phone: [phone] Relation: Spouse Secondary Emergency Contact: Mcelhinney,Adam Mobile Phone: [phone] Relation: Son  Code Status:  Full Code  Goals of care: Advanced Directive information Advanced Directives 10/01/2021  Does Patient Have a Medical Advance Directive? No  Type of Advance Directive -  Does patient want to make changes to medical advance directive? -  Copy of [place] in Chart? -  Would patient like information on creating a medical advance directive? No - Patient declined     Chief Complaint  Patient presents with   Acute Visit    HPI:  Pt is a 81 y.o. female seen today for an acute visit for Follow up of her mood at request of the Husband  Patient has a history of CVA in 11/27 MRI revealed left basal ganglia infarct Had residual Aphasia and Right sided weakness Was treated with Brilinta for 30 days with Aspirin And then Plavix in 12/28 Cardiology did Monitoring for 1 week .Patient could not finish all 3 weeks Admitted in the hospital from 1/26 to 1/31 for hyponatremia due to celexa   Since been in [facility] she was having behavior issues including going to bathroom 20-25 times Washing her hands many times Screaming sometimes Also was depressed Started oN Remeron and Seroquel Doing much better. Sleeping through the night Mood better. No More behavior issues Walk with her walker very well No Falls Wt Readings from Last 3 Encounters:   10/01/21 144 lb 6.4 oz (65.5 kg)  09/19/21 142 lb 12.8 oz (64.8 kg)  09/17/21 142 lb 12.8 oz (64.8 kg)     Past Medical History:  Diagnosis Date   Breast cancer ([place]) 1993   bilateral mastectomy; no chemo or radiation   Glaucoma    Hyperlipidemia    Hypertension    Past Surgical History:  Procedure Laterality Date   MASTECTOMY Bilateral [place]    Allergies  Allergen Reactions   Crab [Shellfish Allergy] Other (See Comments)    From skin test   Fosamax [Alendronate Sodium] Other (See Comments)    Difficult swallowing   Hydrochlorothiazide Other (See Comments)    hyponatremia   Losartan Cough   Raloxifene Other (See Comments)    Unknown reaction   Ace Inhibitors Cough    Reaction to losartan (pt tolerates valsartan)    Allergies as of 10/01/2021       Reactions   Crab [shellfish Allergy] Other (See Comments)   From skin test   Fosamax [alendronate Sodium] Other (See Comments)   Difficult swallowing   Hydrochlorothiazide Other (See Comments)   hyponatremia   Losartan Cough   Raloxifene Other (See Comments)   Unknown reaction   Ace Inhibitors Cough   Reaction to losartan (pt tolerates valsartan)        Medication List        Accurate as of October 01, 2021  9:52 AM. If you have any questions, ask your nurse or doctor.  acetaminophen 325 MG tablet Commonly known as: TYLENOL Take 2 tablets (650 mg total) by mouth every 4 (four) hours as needed for mild pain (or temp > 37.5 C (99.5 F)).   amLODipine 5 MG tablet Commonly known as: NORVASC Take 1 tablet (5 mg total) by mouth daily.   brinzolamide 1 % ophthalmic suspension Commonly known as: AZOPT Place 1 drop into both eyes 2 (two) times daily.   clopidogrel 75 MG tablet Commonly known as: Plavix Plavix 75 mg daily beginning 07/26/2021   LORazepam 0.5 MG tablet Commonly known as: ATIVAN Take 0.25 mg by mouth every 8 (eight) hours  as needed for anxiety.   melatonin 3 MG Tabs tablet 1 tablet at bedtime as needed   mirtazapine 7.5 MG tablet Commonly known as: REMERON Take 15 mg by mouth at bedtime.   polyethylene glycol 17 g packet Commonly known as: MIRALAX / GLYCOLAX Take 17 g by mouth daily.   QUEtiapine 12.5 mg Tabs tablet Commonly known as: SEROQUEL Take 12.5 mg by mouth 2 (two) times daily.   rosuvastatin 10 MG tablet Commonly known as: CRESTOR Take 1 tablet (10 mg total) by mouth daily.   senna-docusate 8.6-50 MG tablet Commonly known as: Senokot-S Take 1 tablet by mouth at bedtime as needed for mild constipation.   timolol 0.5 % ophthalmic solution Commonly known as: TIMOPTIC Place 1 drop into both eyes 2 (two) times daily.   valsartan 320 MG tablet Commonly known as: DIOVAN Take 1 tablet (320 mg total) by mouth daily.        Review of Systems  Constitutional:  Negative for activity change and appetite change.  HENT: Negative.    Respiratory:  Negative for cough and shortness of breath.   Cardiovascular:  Negative for leg swelling.  Gastrointestinal:  Negative for constipation.  Genitourinary:  Positive for frequency.  Musculoskeletal:  Positive for gait problem. Negative for arthralgias and myalgias.  Skin: Negative.   Neurological:  Negative for dizziness and weakness.  Psychiatric/Behavioral:  Positive for behavioral problems and confusion. Negative for dysphoric mood and sleep disturbance.    Immunization History  Administered Date(s) Administered   H1N1 08/23/2008   Influenza Split 05/17/2008, 06/02/2009, 04/19/2011   Influenza,inj,quad, With Preservative 04/06/2014   Influenza-Unspecified 05/14/2012, 04/05/2013, 04/13/2015, 05/09/2016, 05/15/2017, 06/30/2018, 04/10/2019, 06/03/2020, 04/11/2021   Moderna Covid-19 Vaccine Bivalent Booster 9yr & up 05/11/2021   Moderna SARS-COV2 Booster Vaccination 01/16/2021   Moderna Sars-Covid-2 Vaccination 08/10/2019, 09/07/2019,  06/13/2020, 01/16/2021   Pneumococcal Conjugate-13 04/06/2014   Pneumococcal Polysaccharide-23 08/20/2007   Td 11/27/1998, 09/25/2020   Tdap 08/20/2007   Zoster, Live 02/26/2006, 11/14/2016, 05/22/2017   Pertinent  Health Maintenance Due  Topic Date Due   INFLUENZA VACCINE  Completed   DEXA SCAN  Completed   Fall Risk 08/26/2021 08/27/2021 08/27/2021 08/28/2021 08/28/2021  Falls in the past year? - - - - -  Patient Fall Risk Level High fall risk High fall risk High fall risk High fall risk High fall risk   Functional Status Survey:    Vitals:   10/01/21 0944  BP: 134/78  Pulse: 100  Resp: 18  Temp: (!) 97.3 F (36.3 C)  SpO2: 96%  Weight: 144 lb 6.4 oz (65.5 kg)  Height: '5\' 5"'$  (1.651 m)   Body mass index is 24.03 kg/m. Physical Exam Vitals reviewed.  Constitutional:      Appearance: Normal appearance.  HENT:     Head: Normocephalic.     Nose: Nose normal.     Mouth/Throat:  Mouth: Mucous membranes are moist.     Pharynx: Oropharynx is clear.  Eyes:     Pupils: Pupils are equal, round, and reactive to light.  Cardiovascular:     Rate and Rhythm: Normal rate and regular rhythm.     Pulses: Normal pulses.     Heart sounds: Normal heart sounds. No murmur heard. Pulmonary:     Effort: Pulmonary effort is normal.     Breath sounds: Normal breath sounds.  Abdominal:     General: Abdomen is flat. Bowel sounds are normal.     Palpations: Abdomen is soft.  Musculoskeletal:        General: No swelling.     Cervical back: Neck supple.  Skin:    General: Skin is warm.  Neurological:     Mental Status: She is alert.     Comments: Alert Very Pleasant Walking with her walker Stable gait Slightly weak on her Right UE  Psychiatric:        Mood and Affect: Mood normal.        Thought Content: Thought content normal.    Labs reviewed: Recent Labs    06/26/21 0416 06/27/21 0056 06/28/21 0131 06/29/21 0629 08/24/21 1055 08/25/21 0329 08/26/21 1316 08/27/21 0327  08/27/21 1241 08/28/21 0329 08/30/21 0000 09/03/21 0000 09/07/21 0000  NA 131* 129* 129*   < > 143   < > 128* 130*   < > 132* 135*  --  136*  K 3.6 3.4* 3.6   < > 3.4*   < > 3.7 3.5  --  3.5 4.6  --  4.4  CL 99 97* 97*   < > 109   < > 93* 98  --  99 97*  --  101  CO2 '25 24 25   '$ < > 25   < > 27 25  --  25 26*  --  23*  GLUCOSE 113* 118* 114*   < > 130*   < > 117* 105*  --  103*  --   --   --   BUN '11 8 15   '$ < > 27*   < > 16 13  --  19 14  --  9  CREATININE 0.53 0.53 0.61   < > 0.75   < > 0.65 0.39*  --  0.37* 0.4* 0.4* 0.4*  CALCIUM 8.9 8.6* 8.9   < > 8.1*   < > 8.6* 8.0*  --  8.6* 9.1 9.1 8.8  MG 1.8 2.0 2.1  --  1.9  --   --   --   --   --   --   --   --   PHOS 3.0 2.5 3.4  --   --   --   --   --   --   --   --   --   --    < > = values in this interval not displayed.   Recent Labs    06/29/21 0629 07/08/21 1815 08/23/21 1030  AST '21 25 19  '$ ALT 31 32 24  ALKPHOS 41 48 35*  BILITOT 0.7 0.8 0.7  PROT 6.2* 6.7 6.8  ALBUMIN 3.5 3.7 4.3   Recent Labs    06/29/21 0629 07/08/21 1815 07/08/21 1821 08/23/21 1030 08/24/21 0332 08/25/21 0329 08/26/21 0328  WBC 8.9 10.9*   < > 12.2* 8.2 11.6* 10.3  NEUTROABS 5.6 7.7  --  9.4*  --   --   --   HGB  13.2 14.2   < > 14.2 13.6 13.7 15.8*  HCT 39.8 42.4   < > 38.8 37.7 39.5 45.1  MCV 88.8 90.8   < > 81.9 85.1 87.8 86.6  PLT 360 414*   < > 423* 365 349 352   < > = values in this interval not displayed.   Lab Results  Component Value Date   TSH 1.117 06/25/2021   Lab Results  Component Value Date   HGBA1C 5.7 (H) 07/09/2021   Lab Results  Component Value Date   CHOL 120 07/09/2021   HDL 65 07/09/2021   LDLCALC 39 07/09/2021   TRIG 79 07/09/2021   CHOLHDL 1.8 07/09/2021    Significant Diagnostic Results in last 30 days:  No results found.  Assessment/Plan 1. Cerebrovascular disease Was seen by Neurology Continue on Plavix Also on Statin No New recommendations  2. Hyponatremia Recheck BMP Next week  3. Essential  hypertension BP controlled on Valsartan and Norvasc  4. Anxiety and depression Doing well on Remeron and low dose of Seroquel Will leave at the same dose for now Adjusting well to SNF side of [facility]  5. Cognitive and behavioral changes Husband not able to take care of her in her apartment Reassured him that she should be Ok to stay in the SNF for now as she is needing help with her ADLS Will continue her in SNF  Not able to do her ADLS due to her Cognition Check MMSE 6. Mixed hyperlipidemia LDL good level with Statin  7. Urinary frequency Mostly resolved Thought to be due to [employer]    Family/ staff Communication:   Labs/tests ordered:   CBC,BMP and MMSE  Total time spent in this patient care encounter was  _45  minutes; greater than 50% of the visit spent counseling patient and staff, reviewing records , Labs and coordinating care for problems addressed at this encounter.

## 2021-10-02 DIAGNOSIS — E871 Hypo-osmolality and hyponatremia: Secondary | ICD-10-CM | POA: Diagnosis not present

## 2021-10-02 DIAGNOSIS — R278 Other lack of coordination: Secondary | ICD-10-CM | POA: Diagnosis not present

## 2021-10-02 DIAGNOSIS — R1312 Dysphagia, oropharyngeal phase: Secondary | ICD-10-CM | POA: Diagnosis not present

## 2021-10-02 DIAGNOSIS — I69391 Dysphagia following cerebral infarction: Secondary | ICD-10-CM | POA: Diagnosis not present

## 2021-10-02 DIAGNOSIS — G458 Other transient cerebral ischemic attacks and related syndromes: Secondary | ICD-10-CM | POA: Diagnosis not present

## 2021-10-02 DIAGNOSIS — R41841 Cognitive communication deficit: Secondary | ICD-10-CM | POA: Diagnosis not present

## 2021-10-02 DIAGNOSIS — M6389 Disorders of muscle in diseases classified elsewhere, multiple sites: Secondary | ICD-10-CM | POA: Diagnosis not present

## 2021-10-02 DIAGNOSIS — R2681 Unsteadiness on feet: Secondary | ICD-10-CM | POA: Diagnosis not present

## 2021-10-02 DIAGNOSIS — R2689 Other abnormalities of gait and mobility: Secondary | ICD-10-CM | POA: Diagnosis not present

## 2021-10-02 DIAGNOSIS — I69320 Aphasia following cerebral infarction: Secondary | ICD-10-CM | POA: Diagnosis not present

## 2021-10-03 DIAGNOSIS — R1312 Dysphagia, oropharyngeal phase: Secondary | ICD-10-CM | POA: Diagnosis not present

## 2021-10-03 DIAGNOSIS — R41841 Cognitive communication deficit: Secondary | ICD-10-CM | POA: Diagnosis not present

## 2021-10-03 DIAGNOSIS — I69391 Dysphagia following cerebral infarction: Secondary | ICD-10-CM | POA: Diagnosis not present

## 2021-10-03 DIAGNOSIS — I69320 Aphasia following cerebral infarction: Secondary | ICD-10-CM | POA: Diagnosis not present

## 2021-10-04 DIAGNOSIS — R2681 Unsteadiness on feet: Secondary | ICD-10-CM | POA: Diagnosis not present

## 2021-10-04 DIAGNOSIS — I69391 Dysphagia following cerebral infarction: Secondary | ICD-10-CM | POA: Diagnosis not present

## 2021-10-04 DIAGNOSIS — I69320 Aphasia following cerebral infarction: Secondary | ICD-10-CM | POA: Diagnosis not present

## 2021-10-04 DIAGNOSIS — E871 Hypo-osmolality and hyponatremia: Secondary | ICD-10-CM | POA: Diagnosis not present

## 2021-10-04 DIAGNOSIS — R41841 Cognitive communication deficit: Secondary | ICD-10-CM | POA: Diagnosis not present

## 2021-10-04 DIAGNOSIS — R278 Other lack of coordination: Secondary | ICD-10-CM | POA: Diagnosis not present

## 2021-10-04 DIAGNOSIS — M6389 Disorders of muscle in diseases classified elsewhere, multiple sites: Secondary | ICD-10-CM | POA: Diagnosis not present

## 2021-10-04 DIAGNOSIS — G458 Other transient cerebral ischemic attacks and related syndromes: Secondary | ICD-10-CM | POA: Diagnosis not present

## 2021-10-04 DIAGNOSIS — R2689 Other abnormalities of gait and mobility: Secondary | ICD-10-CM | POA: Diagnosis not present

## 2021-10-04 DIAGNOSIS — R1312 Dysphagia, oropharyngeal phase: Secondary | ICD-10-CM | POA: Diagnosis not present

## 2021-10-08 DIAGNOSIS — R531 Weakness: Secondary | ICD-10-CM | POA: Diagnosis not present

## 2021-10-08 DIAGNOSIS — E871 Hypo-osmolality and hyponatremia: Secondary | ICD-10-CM | POA: Diagnosis not present

## 2021-10-08 DIAGNOSIS — R278 Other lack of coordination: Secondary | ICD-10-CM | POA: Diagnosis not present

## 2021-10-08 DIAGNOSIS — R2681 Unsteadiness on feet: Secondary | ICD-10-CM | POA: Diagnosis not present

## 2021-10-08 DIAGNOSIS — R2689 Other abnormalities of gait and mobility: Secondary | ICD-10-CM | POA: Diagnosis not present

## 2021-10-08 DIAGNOSIS — G458 Other transient cerebral ischemic attacks and related syndromes: Secondary | ICD-10-CM | POA: Diagnosis not present

## 2021-10-08 LAB — BASIC METABOLIC PANEL
BUN: 11 (ref 4–21)
CO2: 24 — AB (ref 13–22)
Chloride: 100 (ref 99–108)
Creatinine: 0.5 (ref 0.5–1.1)
Glucose: 117
Potassium: 4.2 mEq/L (ref 3.5–5.1)
Sodium: 137 (ref 137–147)

## 2021-10-08 LAB — COMPREHENSIVE METABOLIC PANEL: Calcium: 9.6 (ref 8.7–10.7)

## 2021-10-10 DIAGNOSIS — I69391 Dysphagia following cerebral infarction: Secondary | ICD-10-CM | POA: Diagnosis not present

## 2021-10-10 DIAGNOSIS — I69320 Aphasia following cerebral infarction: Secondary | ICD-10-CM | POA: Diagnosis not present

## 2021-10-10 DIAGNOSIS — R1312 Dysphagia, oropharyngeal phase: Secondary | ICD-10-CM | POA: Diagnosis not present

## 2021-10-10 DIAGNOSIS — R41841 Cognitive communication deficit: Secondary | ICD-10-CM | POA: Diagnosis not present

## 2021-10-11 DIAGNOSIS — R41841 Cognitive communication deficit: Secondary | ICD-10-CM | POA: Diagnosis not present

## 2021-10-11 DIAGNOSIS — E871 Hypo-osmolality and hyponatremia: Secondary | ICD-10-CM | POA: Diagnosis not present

## 2021-10-11 DIAGNOSIS — R278 Other lack of coordination: Secondary | ICD-10-CM | POA: Diagnosis not present

## 2021-10-11 DIAGNOSIS — I69391 Dysphagia following cerebral infarction: Secondary | ICD-10-CM | POA: Diagnosis not present

## 2021-10-11 DIAGNOSIS — M6389 Disorders of muscle in diseases classified elsewhere, multiple sites: Secondary | ICD-10-CM | POA: Diagnosis not present

## 2021-10-11 DIAGNOSIS — G458 Other transient cerebral ischemic attacks and related syndromes: Secondary | ICD-10-CM | POA: Diagnosis not present

## 2021-10-11 DIAGNOSIS — I69320 Aphasia following cerebral infarction: Secondary | ICD-10-CM | POA: Diagnosis not present

## 2021-10-11 DIAGNOSIS — R1312 Dysphagia, oropharyngeal phase: Secondary | ICD-10-CM | POA: Diagnosis not present

## 2021-10-12 DIAGNOSIS — G458 Other transient cerebral ischemic attacks and related syndromes: Secondary | ICD-10-CM | POA: Diagnosis not present

## 2021-10-12 DIAGNOSIS — E871 Hypo-osmolality and hyponatremia: Secondary | ICD-10-CM | POA: Diagnosis not present

## 2021-10-12 DIAGNOSIS — R41841 Cognitive communication deficit: Secondary | ICD-10-CM | POA: Diagnosis not present

## 2021-10-12 DIAGNOSIS — I69320 Aphasia following cerebral infarction: Secondary | ICD-10-CM | POA: Diagnosis not present

## 2021-10-12 DIAGNOSIS — R1312 Dysphagia, oropharyngeal phase: Secondary | ICD-10-CM | POA: Diagnosis not present

## 2021-10-12 DIAGNOSIS — I69391 Dysphagia following cerebral infarction: Secondary | ICD-10-CM | POA: Diagnosis not present

## 2021-10-12 DIAGNOSIS — M6389 Disorders of muscle in diseases classified elsewhere, multiple sites: Secondary | ICD-10-CM | POA: Diagnosis not present

## 2021-10-12 DIAGNOSIS — R278 Other lack of coordination: Secondary | ICD-10-CM | POA: Diagnosis not present

## 2021-10-12 DIAGNOSIS — I10 Essential (primary) hypertension: Secondary | ICD-10-CM | POA: Diagnosis not present

## 2021-10-12 DIAGNOSIS — R531 Weakness: Secondary | ICD-10-CM | POA: Diagnosis not present

## 2021-10-12 LAB — BASIC METABOLIC PANEL
BUN: 10 (ref 4–21)
CO2: 29 — AB (ref 13–22)
Chloride: 100 (ref 99–108)
Creatinine: 0.5 (ref 0.5–1.1)
Glucose: 101
Potassium: 4.3 mEq/L (ref 3.5–5.1)
Sodium: 138 (ref 137–147)

## 2021-10-12 LAB — COMPREHENSIVE METABOLIC PANEL: Calcium: 9.9 (ref 8.7–10.7)

## 2021-10-15 DIAGNOSIS — M6389 Disorders of muscle in diseases classified elsewhere, multiple sites: Secondary | ICD-10-CM | POA: Diagnosis not present

## 2021-10-15 DIAGNOSIS — R278 Other lack of coordination: Secondary | ICD-10-CM | POA: Diagnosis not present

## 2021-10-15 DIAGNOSIS — I69320 Aphasia following cerebral infarction: Secondary | ICD-10-CM | POA: Diagnosis not present

## 2021-10-15 DIAGNOSIS — R1312 Dysphagia, oropharyngeal phase: Secondary | ICD-10-CM | POA: Diagnosis not present

## 2021-10-15 DIAGNOSIS — E871 Hypo-osmolality and hyponatremia: Secondary | ICD-10-CM | POA: Diagnosis not present

## 2021-10-15 DIAGNOSIS — G458 Other transient cerebral ischemic attacks and related syndromes: Secondary | ICD-10-CM | POA: Diagnosis not present

## 2021-10-15 DIAGNOSIS — R41841 Cognitive communication deficit: Secondary | ICD-10-CM | POA: Diagnosis not present

## 2021-10-15 DIAGNOSIS — I69391 Dysphagia following cerebral infarction: Secondary | ICD-10-CM | POA: Diagnosis not present

## 2021-10-16 DIAGNOSIS — R41841 Cognitive communication deficit: Secondary | ICD-10-CM | POA: Diagnosis not present

## 2021-10-16 DIAGNOSIS — R1312 Dysphagia, oropharyngeal phase: Secondary | ICD-10-CM | POA: Diagnosis not present

## 2021-10-16 DIAGNOSIS — I69391 Dysphagia following cerebral infarction: Secondary | ICD-10-CM | POA: Diagnosis not present

## 2021-10-16 DIAGNOSIS — I69320 Aphasia following cerebral infarction: Secondary | ICD-10-CM | POA: Diagnosis not present

## 2021-10-17 DIAGNOSIS — I69320 Aphasia following cerebral infarction: Secondary | ICD-10-CM | POA: Diagnosis not present

## 2021-10-17 DIAGNOSIS — R41841 Cognitive communication deficit: Secondary | ICD-10-CM | POA: Diagnosis not present

## 2021-10-17 DIAGNOSIS — R1312 Dysphagia, oropharyngeal phase: Secondary | ICD-10-CM | POA: Diagnosis not present

## 2021-10-17 DIAGNOSIS — I69391 Dysphagia following cerebral infarction: Secondary | ICD-10-CM | POA: Diagnosis not present

## 2021-10-18 DIAGNOSIS — R1312 Dysphagia, oropharyngeal phase: Secondary | ICD-10-CM | POA: Diagnosis not present

## 2021-10-18 DIAGNOSIS — I69320 Aphasia following cerebral infarction: Secondary | ICD-10-CM | POA: Diagnosis not present

## 2021-10-18 DIAGNOSIS — I69391 Dysphagia following cerebral infarction: Secondary | ICD-10-CM | POA: Diagnosis not present

## 2021-10-18 DIAGNOSIS — R41841 Cognitive communication deficit: Secondary | ICD-10-CM | POA: Diagnosis not present

## 2021-10-19 DIAGNOSIS — I69391 Dysphagia following cerebral infarction: Secondary | ICD-10-CM | POA: Diagnosis not present

## 2021-10-19 DIAGNOSIS — R41841 Cognitive communication deficit: Secondary | ICD-10-CM | POA: Diagnosis not present

## 2021-10-19 DIAGNOSIS — I69320 Aphasia following cerebral infarction: Secondary | ICD-10-CM | POA: Diagnosis not present

## 2021-10-19 DIAGNOSIS — R1312 Dysphagia, oropharyngeal phase: Secondary | ICD-10-CM | POA: Diagnosis not present

## 2021-10-22 DIAGNOSIS — R1312 Dysphagia, oropharyngeal phase: Secondary | ICD-10-CM | POA: Diagnosis not present

## 2021-10-22 DIAGNOSIS — R41841 Cognitive communication deficit: Secondary | ICD-10-CM | POA: Diagnosis not present

## 2021-10-22 DIAGNOSIS — I69391 Dysphagia following cerebral infarction: Secondary | ICD-10-CM | POA: Diagnosis not present

## 2021-10-22 DIAGNOSIS — I69320 Aphasia following cerebral infarction: Secondary | ICD-10-CM | POA: Diagnosis not present

## 2021-10-26 DIAGNOSIS — G458 Other transient cerebral ischemic attacks and related syndromes: Secondary | ICD-10-CM | POA: Diagnosis not present

## 2021-10-26 DIAGNOSIS — E871 Hypo-osmolality and hyponatremia: Secondary | ICD-10-CM | POA: Diagnosis not present

## 2021-10-26 DIAGNOSIS — R278 Other lack of coordination: Secondary | ICD-10-CM | POA: Diagnosis not present

## 2021-10-26 DIAGNOSIS — M6389 Disorders of muscle in diseases classified elsewhere, multiple sites: Secondary | ICD-10-CM | POA: Diagnosis not present

## 2021-11-06 DIAGNOSIS — R350 Frequency of micturition: Secondary | ICD-10-CM | POA: Diagnosis not present

## 2021-11-09 ENCOUNTER — Encounter: Payer: Self-pay | Admitting: Cardiology

## 2021-11-09 ENCOUNTER — Ambulatory Visit: Payer: Medicare Other | Admitting: Cardiology

## 2021-11-09 ENCOUNTER — Encounter: Payer: Self-pay | Admitting: Adult Health

## 2021-11-09 VITALS — BP 124/67 | HR 80 | Temp 97.8°F | Resp 16 | Ht 65.0 in | Wt 148.0 lb

## 2021-11-09 DIAGNOSIS — I639 Cerebral infarction, unspecified: Secondary | ICD-10-CM

## 2021-11-09 NOTE — Progress Notes (Signed)
? ? ?Patient referred by Kelton Pillar, MD for stroke ? ?Subjective:  ? ?Sandra Becker, female    DOB: 1940/07/30, 81 y.o.   MRN: [id number] ? ? ?Chief Complaint  ?Patient presents with  ? Cerebrovascular Accident  ? Follow-up  ?  3 month  ? ? ? ?HPI ? ?81 y.o. Caucasian female with hypertension, hyperlipidemia, prediabetes, glaucoma, breast cancer s/p b/l mastectomy, with stroke (06/2021). ? ?Patient is here with her husband today.  Patient is in the memory unit for last few months.  She does not have any complaints today.  She does indeed have amnesia about several events related to her stroke. ? ?Discharge summary 07/10/2021: ?Recently admitted on 11/27 for acute onset slurred speech and right-sided weakness.  MRI revealed left basal ganglia infarct.  Felt to be due to small vessel disease versus cardioembolic source.  Recent echo showing EF 60 to 26%, grade 1 diastolic dysfunction, moderate mitral annular calcification, mild to moderate aortic valve sclerosis/calcification.  Recent labs showing A1c 5.7, LDL 75.  Patient was started on aspirin 81 mg daily and Brilinta 90 mg twice daily x30 days, then continue Plavix alone starting 07/25/2021.  Neurology had recommended 30-day cardiac monitor as an outpatient to rule out A. fib.  Patient was discharged to [facility] on 12/1 and returned to her independent living facility on 12/10.  She returned to the ED on 12/11 due to abrupt worsening of her aphasia and right-sided weakness.  Patient was seen by neurology and was hospitalized.  ? ?Hospital course: ?Right-sided weakness with aphasia in the setting of recent stroke ?MRI did not reveal any new infarct did show expansion of the previously seen left basal ganglia infarct.  Patient had stroke work-up recently including an echocardiogram which showed normal systolic function with grade 1 diastolic dysfunction.   ?Reason for presentation is not entirely clear.  Does not appear that she had a new stroke.  Presyncope could  have been responsible. Orthostatic vital signs were unremarkable. ?LDL was 75 during previous admission, now it is 39.  HbA1c 5.7.   ?Urine drug screen unremarkable.   ?Patient was on aspirin and Brilinta to be taken till 12/27 following which patient to start taking Plavix. ?Patient is on Crestor. ?PT OT SLP evaluation ?EEG did not show any epileptiform activity.   ?Discussed with Dr. Erlinda Hong with neurology.  Okay for discharge home today.  Seen by PT and OT and outpatient therapy is recommended. ?  ?Hyponatremia ?This appears to be chronic.  Stable for the most part.  Chlorthalidone was recently discontinued.   ?  ?Essential hypertension ?Blood pressure is noted to be poorly controlled.  Continue with ARB.  Amlodipine added.  Will need outpatient follow-up for same.   ?  ?History of glaucoma ?Continue with eyedrops. ?  ?Prediabetes ?HbA1c 5.7 ? ?Patient is here today with her husband. She is in a wheelchair. She requires assistance with standing up and walking. She is participating in neuro rehab. She is struggling with memory issues, sleep and emotional stress. She has not undergone any monitor testing for Afib.  ? ? ?Current Outpatient Medications:  ?  acetaminophen (TYLENOL) 325 MG tablet, Take 2 tablets (650 mg total) by mouth every 4 (four) hours as needed for mild pain (or temp > 37.5 C (99.5 F))., Disp: , Rfl:  ?  amLODipine (NORVASC) 5 MG tablet, Take 1 tablet (5 mg total) by mouth daily., Disp: 30 tablet, Rfl: 1 ?  brinzolamide (AZOPT) 1 % ophthalmic suspension, Place  1 drop into both eyes 2 (two) times daily. , Disp: , Rfl:  ?  clopidogrel (PLAVIX) 75 MG tablet, Plavix 75 mg daily beginning 07/26/2021, Disp: 30 tablet, Rfl: 1 ?  LORazepam (ATIVAN) 0.5 MG tablet, Take 0.25 mg by mouth every 8 (eight) hours as needed for anxiety., Disp: , Rfl:  ?  melatonin 3 MG TABS tablet, 1 tablet at bedtime as needed, Disp: , Rfl:  ?  mirtazapine (REMERON) 7.5 MG tablet, Take 15 mg by mouth at bedtime., Disp: , Rfl:  ?   polyethylene glycol (MIRALAX / GLYCOLAX) 17 g packet, Take 17 g by mouth daily., Disp: , Rfl:  ?  QUEtiapine (SEROQUEL) 12.5 mg TABS tablet, Take 12.5 mg by mouth 2 (two) times daily., Disp: , Rfl:  ?  rosuvastatin (CRESTOR) 10 MG tablet, Take 1 tablet (10 mg total) by mouth daily., Disp: 30 tablet, Rfl: 0 ?  senna-docusate (SENOKOT-S) 8.6-50 MG tablet, Take 1 tablet by mouth at bedtime as needed for mild constipation., Disp: 10 tablet, Rfl: 1 ?  timolol (TIMOPTIC) 0.5 % ophthalmic solution, Place 1 drop into both eyes 2 (two) times daily., Disp: , Rfl:  ?  valsartan (DIOVAN) 320 MG tablet, Take 1 tablet (320 mg total) by mouth daily., Disp: 30 tablet, Rfl: 0 ? ?Cardiovascular and other pertinent studies: ? ?Mobile cardiac telemetry 7 days 08/10/2021 - 08/18/2021: ?Dominant rhythm: Sinus. ?HR 58-118 bpm. Avg HR 79 bpm, in sinus rhythm. ?25 episodes of atrial tachycardia, fastest at 156 bpm for 10 beats, longest for 13 beats at 116 bpm. ?1.3% isolated SVE, <1% couplet/triplets. ?0 episodes of VT. ?<1% isolated VE,  Couplets. ?No atrial fibrillation/atrial flutter/VT/high grade AV block, sinus pause >3sec noted. ?0 patient triggered events.  ?  ? ?EKG 08/10/2021: ?Sinus rhythm 81 bpm ?Possible old inferior infarct  ?Nonspecific ST-T changes ? ?Echocardiogram 07/09/2021: ? 1. Left ventricular ejection fraction, by estimation, is 60 to 65%. The  ?left ventricle has normal function. The left ventricle has no regional  ?wall motion abnormalities. There is mild left ventricular hypertrophy.  ?Left ventricular diastolic parameters  ?are consistent with Grade I diastolic dysfunction (impaired relaxation).  ? 2. Right ventricular systolic function is normal. The right ventricular  ?size is normal. Tricuspid regurgitation signal is inadequate for assessing  ?PA pressure.  ? 3. No evidence of mitral valve regurgitation.  ? 4. The aortic valve is calcified. Aortic valve regurgitation is mild.  ? 5. The inferior vena cava is normal  in size with greater than 50%  ?respiratory variability, suggesting right atrial pressure of 3 mmHg.  ? ?MRI brain 07/08/2021: ?1. Interval expansion of previously identified left basal ?ganglia/corona radiata infarct, now measuring up to 3.6 cm. No ?associated hemorrhage or significant regional mass effect. ?2. No other acute intracranial abnormality. ?3. 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 ? ?CTA head/neck 05/03/2021: ?No intracranial large vessel occlusion or correctable proximal ?stenosis. Some distal vessel atherosclerotic irregularity. ?  ?Both carotid bifurcations are widely patent. ?  ?Aortic Atherosclerosis ?  ? ? ?Recent labs: ?07/10/2021: ?Glucose 109, BUN/Cr 9/0.5. EGFR >60. Na/K 131/3.3. ?H/H 14/41. MCV 87. Platelets 370 ?HbA1C 5.7% ?Chol 120, TG 79, HDL 65, LDL 39 ? ? ?Review of Systems  ?Cardiovascular:  Negative for chest pain, dyspnea on exertion, leg swelling, palpitations and syncope.  ?Neurological:  Positive for loss of balance and weakness.  ?Psychiatric/Behavioral:  Positive for memory loss. The patient is nervous/anxious.   ? ?   ? ? ?Vitals:  ?  11/09/21 1029  ?BP: 124/67  ?Pulse: 80  ?Resp: 16  ?Temp: 97.8 ?F (36.6 ?C)  ?SpO2: 96%  ? ? ? ?Body mass index is 24.63 kg/m?. Danley Danker Weights  ? 11/09/21 1029  ?Weight: 148 lb (67.1 kg)  ? ? ? ?Objective:  ? Physical Exam ?Vitals and nursing note reviewed.  ?Constitutional:   ?   General: She is not in acute distress. ?Neck:  ?   Vascular: No JVD.  ?Cardiovascular:  ?   Rate and Rhythm: Normal rate and regular rhythm.  ?   Heart sounds: Normal heart sounds. No murmur heard. ?Pulmonary:  ?   Effort: Pulmonary effort is normal.  ?   Breath sounds: Normal breath sounds. No wheezing or rales.  ?Musculoskeletal:  ?   Right lower leg: No edema.  ?   Left lower leg: No edema.  ?Neurological:  ?   General: No focal deficit present.  ?   Mental Status: She is oriented to person, place, and time.  ? ?    ? ?Assessment & Recommendations:  ? ? ?81 y.o. Caucasian female with hypertension, hyperlipidemia, prediabetes, glaucoma, breast cancer s/p b/l mastectomy, with stroke (06/2021). ? ?Stroke: ?H/o left b

## 2021-11-09 NOTE — Progress Notes (Signed)
?Location:   [facility] ?Nursing Home Room Number: 270 ?Place of Service:  SNF (31) ?Provider:  Royal Hawthorn, NP ? ?Kelton Pillar, MD ? ?Patient Care Team: ?Kelton Pillar, MD as PCP - General (Family Medicine) ?Jerline Pain, MD as PCP - Cardiology (Cardiology) ? ?Extended Emergency Contact Information ?Primary Emergency Contact: Mehra,Maurice ?Address: [address] ?         [place], [place] [address] [place] ?Home Phone: [phone] ?Mobile Phone: [phone] ?Relation: Spouse ?Secondary Emergency Contact: Renfro,Adam ?Mobile Phone: [phone] ?Relation: Son ? ?Code Status:  DNR ?Goals of care: Advanced Directive information ? ?  11/09/2021  ?  9:00 AM  ?Advanced Directives  ?Does Patient Have a Medical Advance Directive? Yes  ?Type of Advance Directive Living will  ?Does patient want to make changes to medical advance directive? No - Patient declined  ?Would patient like information on creating a medical advance directive? No - Patient declined  ? ? ? ?Chief Complaint  ?Patient presents with  ? Medical Management of Chronic Issues  ? Quality Metric Gaps  ?  Verified Matrix and [facility] patient is due for Shingrix. ?  ? ? ?HPI:  ?Pt is a 81 y.o. female seen today for medical management of chronic diseases.   ? ? ?Past Medical History:  ?Diagnosis Date  ? Breast cancer ([place]) 1993  ? bilateral mastectomy; no chemo or radiation  ? Glaucoma   ? Hyperlipidemia   ? Hypertension   ? ?Past Surgical History:  ?Procedure Laterality Date  ? MASTECTOMY Bilateral 1993  ? TONSILLECTOMY AND ADENOIDECTOMY  1947  ? TUBAL LIGATION  1974  ? ? ?Allergies  ?Allergen Reactions  ? Crab [Shellfish Allergy] Other (See Comments)  ?  From skin test  ? Fosamax [Alendronate Sodium] Other (See Comments)  ?  Difficult swallowing  ? Hydrochlorothiazide Other (See Comments)  ?  hyponatremia  ? Losartan Cough  ? Raloxifene Other (See Comments)  ?  Unknown reaction  ? Ace Inhibitors  Cough  ?  Reaction to losartan (pt tolerates valsartan)  ? ? ?Allergies as of 11/09/2021   ? ?   Reactions  ? Crab [shellfish Allergy] Other (See Comments)  ? From skin test  ? Fosamax [alendronate Sodium] Other (See Comments)  ? Difficult swallowing  ? Hydrochlorothiazide Other (See Comments)  ? hyponatremia  ? Losartan Cough  ? Raloxifene Other (See Comments)  ? Unknown reaction  ? Ace Inhibitors Cough  ? Reaction to losartan (pt tolerates valsartan)  ? ?  ? ?  ?Medication List  ?  ? ?  ? Accurate as of November 09, 2021  9:04 AM. If you have any questions, ask your nurse or doctor.  ?  ?  ? ?  ? ?acetaminophen 325 MG tablet ?Commonly known as: TYLENOL ?Take 2 tablets (650 mg total) by mouth every 4 (four) hours as needed for mild pain (or temp > 37.5 C (99.5 F)). ?  ?amLODipine 5 MG tablet ?Commonly known as: NORVASC ?Take 1 tablet (5 mg total) by mouth daily. ?  ?brinzolamide 1 % ophthalmic suspension ?Commonly known as: AZOPT ?Place 1 drop into both eyes 2 (two) times daily. ?  ?clopidogrel 75 MG tablet ?Commonly known as: Plavix ?Plavix 75 mg daily beginning 07/26/2021 ?  ?LORazepam 0.5 MG tablet ?Commonly known as: ATIVAN ?Take 0.25 mg by mouth every 8 (eight) hours as needed for anxiety. ?  ?melatonin 3 MG Tabs tablet ?1 tablet at bedtime as needed ?  ?  mirtazapine 7.5 MG tablet ?Commonly known as: REMERON ?Take 15 mg by mouth at bedtime. ?  ?polyethylene glycol 17 g packet ?Commonly known as: MIRALAX / GLYCOLAX ?Take 17 g by mouth daily. ?  ?QUEtiapine 12.5 mg Tabs tablet ?Commonly known as: SEROQUEL ?Take 12.5 mg by mouth 2 (two) times daily. ?  ?rosuvastatin 10 MG tablet ?Commonly known as: CRESTOR ?Take 1 tablet (10 mg total) by mouth daily. ?  ?senna-docusate 8.6-50 MG tablet ?Commonly known as: Senokot-S ?Take 1 tablet by mouth at bedtime as needed for mild constipation. ?  ?timolol 0.5 % ophthalmic solution ?Commonly known as: TIMOPTIC ?Place 1 drop into both eyes 2 (two) times daily. ?  ?valsartan 320 MG  tablet ?Commonly known as: DIOVAN ?Take 1 tablet (320 mg total) by mouth daily. ?  ? ?  ? ? ?Review of Systems ? ?Immunization History  ?Administered Date(s) Administered  ? H1N1 08/23/2008  ? Influenza Split 05/17/2008, 06/02/2009, 04/19/2011  ? Influenza,inj,quad, With Preservative 04/06/2014  ? Influenza-Unspecified 05/14/2012, 04/05/2013, 04/13/2015, 05/09/2016, 05/15/2017, 06/30/2018, 04/10/2019, 06/03/2020, 04/11/2021  ? Moderna Covid-19 Vaccine Bivalent Booster 71yr & up 05/11/2021  ? Moderna SARS-COV2 Booster Vaccination 01/16/2021  ? Moderna Sars-Covid-2 Vaccination 08/10/2019, 09/07/2019, 06/13/2020, 01/16/2021  ? Pneumococcal Conjugate-13 04/06/2014  ? Pneumococcal Polysaccharide-23 08/20/2007  ? Td 11/27/1998, 09/25/2020  ? Tdap 08/20/2007  ? Zoster, Live 02/26/2006, 11/14/2016, 05/22/2017  ? ?Pertinent  Health Maintenance Due  ?Topic Date Due  ? INFLUENZA VACCINE  02/26/2022  ? DEXA SCAN  Completed  ? ? ?  08/26/2021  ?  8:15 PM 08/27/2021  ?  7:05 AM 08/27/2021  ?  8:58 PM 08/28/2021  ?  7:46 AM 08/28/2021  ?  8:21 AM  ?Fall Risk  ?Patient Fall Risk Level High fall risk High fall risk High fall risk High fall risk High fall risk  ? ?Functional Status Survey: ?  ? ?Vitals:  ? 11/09/21 0855  ?BP: (!) 144/80  ?Pulse: 78  ?Resp: 18  ?Temp: 97.9 ?F (36.6 ?C)  ?SpO2: 97%  ?Weight: 149 lb (67.6 kg)  ?Height: '5\' 5"'$  (1.651 m)  ? ?Body mass index is 24.79 kg/m?.Marland Kitchen?Physical Exam ? ?Labs reviewed: ?Recent Labs  ?  06/26/21 ?0416 06/27/21 ?0725312/01/22 ?0131 06/29/21 ?0629 08/24/21 ?1055 08/25/21 ?0329 08/26/21 ?1316 08/27/21 ?0327 08/27/21 ?1241 08/28/21 ?0329 08/30/21 ?0000 09/07/21 ?0000 10/08/21 ?0000 10/12/21 ?0000  ?NA 131* 129* 129*   < > 143   < > 128* 130*   < > 132*   < > 136* 137 138  ?K 3.6 3.4* 3.6   < > 3.4*   < > 3.7 3.5  --  3.5   < > 4.4 4.2 4.3  ?CL 99 97* 97*   < > 109   < > 93* 98  --  99   < > 101 100 100  ?CO2 '25 24 25   '$ < > 25   < > 27 25  --  25   < > 23* 24* 29*  ?GLUCOSE 113* 118* 114*   < >  130*   < > 117* 105*  --  103*  --   --   --   --   ?BUN '11 8 15   '$ < > 27*   < > 16 13  --  19   < > '9 11 10  '$ ?CREATININE 0.53 0.53 0.61   < > 0.75   < > 0.65 0.39*  --  0.37*   < > 0.4*  0.5 0.5  ?CALCIUM 8.9 8.6* 8.9   < > 8.1*   < > 8.6* 8.0*  --  8.6*   < > 8.8 9.6 9.9  ?MG 1.8 2.0 2.1  --  1.9  --   --   --   --   --   --   --   --   --   ?PHOS 3.0 2.5 3.4  --   --   --   --   --   --   --   --   --   --   --   ? < > = values in this interval not displayed.  ? ?Recent Labs  ?  06/29/21 ?3016 07/08/21 ?1815 08/23/21 ?1030  ?AST '21 25 19  '$ ?ALT 31 32 24  ?ALKPHOS 41 48 35*  ?BILITOT 0.7 0.8 0.7  ?PROT 6.2* 6.7 6.8  ?ALBUMIN 3.5 3.7 4.3  ? ?Recent Labs  ?  06/29/21 ?0109 07/08/21 ?1815 07/08/21 ?1821 08/23/21 ?1030 08/24/21 ?3235 08/25/21 ?5732 08/26/21 ?2025  ?WBC 8.9 10.9*   < > 12.2* 8.2 11.6* 10.3  ?NEUTROABS 5.6 7.7  --  9.4*  --   --   --   ?HGB 13.2 14.2   < > 14.2 13.6 13.7 15.8*  ?HCT 39.8 42.4   < > 38.8 37.7 39.5 45.1  ?MCV 88.8 90.8   < > 81.9 85.1 87.8 86.6  ?PLT 360 414*   < > 423* 365 349 352  ? < > = values in this interval not displayed.  ? ?Lab Results  ?Component Value Date  ? TSH 1.117 06/25/2021  ? ?Lab Results  ?Component Value Date  ? HGBA1C 5.7 (H) 07/09/2021  ? ?Lab Results  ?Component Value Date  ? CHOL 120 07/09/2021  ? HDL 65 07/09/2021  ? [place] 39 07/09/2021  ? TRIG 79 07/09/2021  ? CHOLHDL 1.8 07/09/2021  ? ? ?Significant Diagnostic Results in last 30 days:  ?No results found. ? ?Assessment/Plan ?There are no diagnoses linked to this encounter. ? ? ?Family/ staff Communication:  ? ?Labs/tests ordered:   ? ?  ?

## 2021-11-12 NOTE — Progress Notes (Signed)
This encounter was created in error - please disregard.

## 2021-11-16 ENCOUNTER — Encounter: Payer: Self-pay | Admitting: Adult Health

## 2021-11-16 ENCOUNTER — Non-Acute Institutional Stay (SKILLED_NURSING_FACILITY): Payer: Medicare Other | Admitting: Adult Health

## 2021-11-16 DIAGNOSIS — I10 Essential (primary) hypertension: Secondary | ICD-10-CM | POA: Diagnosis not present

## 2021-11-16 DIAGNOSIS — R1312 Dysphagia, oropharyngeal phase: Secondary | ICD-10-CM | POA: Diagnosis not present

## 2021-11-16 DIAGNOSIS — E7800 Pure hypercholesterolemia, unspecified: Secondary | ICD-10-CM | POA: Diagnosis not present

## 2021-11-16 DIAGNOSIS — I639 Cerebral infarction, unspecified: Secondary | ICD-10-CM | POA: Diagnosis not present

## 2021-11-16 DIAGNOSIS — F329 Major depressive disorder, single episode, unspecified: Secondary | ICD-10-CM | POA: Diagnosis not present

## 2021-11-16 NOTE — Progress Notes (Signed)
?Location:  [facility] ?Nursing Home Room Number: 921/J ?Place of Service:  SNF (31) ?Provider:  Royal Hawthorn, NP ? ?Patient Care Team: ?Kelton Pillar, MD as PCP - General (Family Medicine) ?Jerline Pain, MD as PCP - Cardiology (Cardiology) ? ?Extended Emergency Contact Information ?Primary Emergency Contact: Carnegie,Maurice ?Address: [address] ?         [place], [place] [address] [place] ?Home Phone: [phone] ?Mobile Phone: [phone] ?Relation: Spouse ?Secondary Emergency Contact: Das,Adam ?Mobile Phone: [phone] ?Relation: Son ? ?Code Status:  DNR ?Goals of care: Advanced Directive information ? ?  11/16/2021  ? 10:17 AM  ?Advanced Directives  ?Does Patient Have a Medical Advance Directive? Yes  ?Type of Advance Directive Living will  ?Does patient want to make changes to medical advance directive? No - Patient declined  ? ? ? ?Chief Complaint  ?Patient presents with  ? Medical Management of Chronic Issues  ?  Routine visit.   ? Quality Metric Gaps  ?  Discuss the need for Shingrix vaccine, or post pone if patient refuses.   ? ? ?HPI:  ?Pt is a 81 y.o. female seen today for medical management of chronic diseases.   ? ?hx of left basal ganglia CVA (hospital admit 06/24/21) with right sided weakness, aphasia, and dysphagia. On plavix. Ambulatory with a walker. Has made progress with swallow function and speech. Now on a regular diet. No reported issues.  ? ?Resident had some issues of feeling dizzy last month and had orthostatic vitals done with minimal drop. She denies further dizziness.  ? ?Also has UA done for urinary frequency which was negative last month. She still reports some frequency. No bladder pain or difficulty emptying. She has had some issues in the past with perseveration on urination and agitation which improved with remeron and serqouel. Has not needed prn ativan in the past week. MMSE 09/2021 23/30 with poor oriented and  recall.  ? ?Reviewed records and she has had shingrix, recorded updated.  ? ?Hx of low sodium due to celexa, now NA 138 10/12/21 ? ?Bps reviewed in matrix.  ?Blood Pressure: 134 / 79 mmHg ? ?Blood Pressure: 144 / 80 mmHg ? ?Blood Pressure: 151 / 83 mmHg ? ?Blood Pressure: 148 / 74 mmHg ? ?Blood Pressure: 158 / 74 mmHg ? ?HLD on crestor.  ? ?Lab Results  ?Component Value Date  ? CHOL 120 07/09/2021  ? HDL 65 07/09/2021  ? [place] 39 07/09/2021  ? TRIG 79 07/09/2021  ? CHOLHDL 1.8 07/09/2021  ? ? ?Past Medical History:  ?Diagnosis Date  ? Breast cancer ([place]) 1993  ? bilateral mastectomy; no chemo or radiation  ? Glaucoma   ? Hyperlipidemia   ? Hypertension   ? ?Past Surgical History:  ?Procedure Laterality Date  ? MASTECTOMY Bilateral 1993  ? TONSILLECTOMY AND ADENOIDECTOMY  1947  ? TUBAL LIGATION  1974  ? ? ?Allergies  ?Allergen Reactions  ? Crab [Shellfish Allergy] Other (See Comments)  ?  From skin test  ? Fosamax [Alendronate Sodium] Other (See Comments)  ?  Difficult swallowing  ? Hydrochlorothiazide Other (See Comments)  ?  hyponatremia  ? Losartan Cough  ? Raloxifene Other (See Comments)  ?  Unknown reaction  ? Ace Inhibitors Cough  ?  Reaction to losartan (pt tolerates valsartan)  ? ? ?Outpatient Encounter Medications as of 11/16/2021  ?Medication Sig  ? acetaminophen (TYLENOL) 325 MG tablet Take 2 tablets (650 mg total) by mouth every 4 (four)  hours as needed for mild pain (or temp > 37.5 C (99.5 F)).  ? amLODipine (NORVASC) 5 MG tablet Take 1 tablet (5 mg total) by mouth daily.  ? brinzolamide (AZOPT) 1 % ophthalmic suspension Place 1 drop into both eyes 2 (two) times daily.   ? clopidogrel (PLAVIX) 75 MG tablet Plavix 75 mg daily beginning 07/26/2021  ? LORazepam (ATIVAN) 0.5 MG tablet Take 0.25 mg by mouth every 8 (eight) hours as needed for anxiety.  ? melatonin 3 MG TABS tablet 1 tablet at bedtime as needed  ? mirtazapine (REMERON) 7.5 MG tablet Take 15 mg by mouth at bedtime.  ? polyethylene glycol  (MIRALAX / GLYCOLAX) 17 g packet Take 17 g by mouth daily.  ? QUEtiapine (SEROQUEL) 12.5 mg TABS tablet Take 25 mg by mouth 2 (two) times daily.  ? rosuvastatin (CRESTOR) 10 MG tablet Take 1 tablet (10 mg total) by mouth daily.  ? senna-docusate (SENOKOT-S) 8.6-50 MG tablet Take 1 tablet by mouth at bedtime as needed for mild constipation.  ? timolol (TIMOPTIC) 0.5 % ophthalmic solution Place 1 drop into both eyes 2 (two) times daily.  ? valsartan (DIOVAN) 320 MG tablet Take 1 tablet (320 mg total) by mouth daily.  ? ?No facility-administered encounter medications on file as of 11/16/2021.  ? ? ?Review of Systems  ?Constitutional:  Negative for activity change, appetite change, chills, diaphoresis, fatigue, fever and unexpected weight change.  ?HENT:  Negative for congestion.   ?Respiratory:  Negative for cough, shortness of breath and wheezing.   ?Cardiovascular:  Negative for chest pain, palpitations and leg swelling.  ?Gastrointestinal:  Negative for abdominal distention, abdominal pain, constipation and diarrhea.  ?Genitourinary:  Positive for frequency. Negative for difficulty urinating and dysuria.  ?Musculoskeletal:  Positive for gait problem. Negative for arthralgias, back pain, joint swelling and myalgias.  ?Neurological:  Positive for speech difficulty and weakness. Negative for dizziness, tremors, seizures, syncope, facial asymmetry, light-headedness, numbness and headaches.  ?Psychiatric/Behavioral:  Positive for confusion. Negative for agitation and behavioral problems.   ? ?Immunization History  ?Administered Date(s) Administered  ? H1N1 08/23/2008  ? Influenza Split 05/17/2008, 06/02/2009, 04/19/2011  ? Influenza,inj,quad, With Preservative 04/06/2014  ? Influenza-Unspecified 05/14/2012, 04/05/2013, 04/13/2015, 05/09/2016, 05/15/2017, 06/30/2018, 04/10/2019, 06/03/2020, 04/11/2021  ? Moderna Covid-19 Vaccine Bivalent Booster 32yr & up 05/11/2021  ? Moderna SARS-COV2 Booster Vaccination 01/16/2021  ?  Moderna Sars-Covid-2 Vaccination 08/10/2019, 09/07/2019, 06/13/2020, 01/16/2021  ? Pneumococcal Conjugate-13 04/06/2014  ? Pneumococcal Polysaccharide-23 08/20/2007  ? Td 11/27/1998, 09/25/2020  ? Tdap 08/20/2007  ? Zoster, Live 02/26/2006, 11/14/2016, 05/22/2017  ? ?Pertinent  Health Maintenance Due  ?Topic Date Due  ? INFLUENZA VACCINE  02/26/2022  ? DEXA SCAN  Completed  ? ? ?  08/26/2021  ?  8:15 PM 08/27/2021  ?  7:05 AM 08/27/2021  ?  8:58 PM 08/28/2021  ?  7:46 AM 08/28/2021  ?  8:21 AM  ?Fall Risk  ?Patient Fall Risk Level High fall risk High fall risk High fall risk High fall risk High fall risk  ? ?Functional Status Survey: ?  ? ?Vitals:  ? 11/16/21 1012  ?BP: 134/79  ?Pulse: 84  ?Resp: 17  ?Temp: (!) 97.5 ?F (36.4 ?C)  ?SpO2: 98%  ?Weight: 149 lb (67.6 kg)  ?Height: '5\' 5"'$  (1.651 m)  ? ?Body mass index is 24.79 kg/m?.Marland Kitchen?Physical Exam ?Vitals and nursing note reviewed.  ?Constitutional:   ?   General: She is not in acute distress. ?   Appearance: She is  not diaphoretic.  ?HENT:  ?   Head: Normocephalic and atraumatic.  ?   Mouth/Throat:  ?   Mouth: Mucous membranes are moist.  ?   Pharynx: Oropharynx is clear.  ?Eyes:  ?   Conjunctiva/sclera: Conjunctivae normal.  ?   Pupils: Pupils are equal, round, and reactive to light.  ?Neck:  ?   Vascular: No JVD.  ?Cardiovascular:  ?   Rate and Rhythm: Normal rate and regular rhythm.  ?   Heart sounds: No murmur heard. ?Pulmonary:  ?   Effort: Pulmonary effort is normal. No respiratory distress.  ?   Breath sounds: Normal breath sounds. No wheezing.  ?Abdominal:  ?   General: Abdomen is flat. Bowel sounds are normal.  ?   Palpations: Abdomen is soft.  ?Musculoskeletal:  ?   Cervical back: No rigidity or tenderness.  ?   Right lower leg: No edema.  ?   Left lower leg: No edema.  ?Lymphadenopathy:  ?   Cervical: No cervical adenopathy.  ?Skin: ?   General: Skin is warm and dry.  ?Neurological:  ?   Mental Status: She is alert.  ?   Comments: Alert and oriented to self and  placed. Forgetful about the details of care  ?Slightly weaker on the right side.   ? ? ?Labs reviewed: ?Recent Labs  ?  06/26/21 ?0416 06/27/21 ?4656 06/28/21 ?0131 06/29/21 ?8127 08/24/21 ?1055 08/25/21 ?5170

## 2021-11-22 DIAGNOSIS — Z23 Encounter for immunization: Secondary | ICD-10-CM | POA: Diagnosis not present

## 2021-12-06 ENCOUNTER — Encounter: Payer: Self-pay | Admitting: Adult Health

## 2021-12-06 ENCOUNTER — Non-Acute Institutional Stay (SKILLED_NURSING_FACILITY): Payer: Medicare Other | Admitting: Adult Health

## 2021-12-06 DIAGNOSIS — F419 Anxiety disorder, unspecified: Secondary | ICD-10-CM | POA: Diagnosis not present

## 2021-12-06 DIAGNOSIS — I10 Essential (primary) hypertension: Secondary | ICD-10-CM | POA: Diagnosis not present

## 2021-12-06 DIAGNOSIS — E871 Hypo-osmolality and hyponatremia: Secondary | ICD-10-CM | POA: Diagnosis not present

## 2021-12-06 DIAGNOSIS — F32A Depression, unspecified: Secondary | ICD-10-CM | POA: Diagnosis not present

## 2021-12-06 DIAGNOSIS — I951 Orthostatic hypotension: Secondary | ICD-10-CM

## 2021-12-06 NOTE — Progress Notes (Signed)
?Location:  [facility] ?Nursing Home Room Number: [id number] ?Place of Service:  SNF (31) ?Provider:  Royal Hawthorn, NP  ? ?Patient Care Team: ?Kelton Pillar, MD as PCP - General (Family Medicine) ?Jerline Pain, MD as PCP - Cardiology (Cardiology) ? ?Extended Emergency Contact Information ?Primary Emergency Contact: Varelas,Maurice ?Address: [address] ?         [place], [place] [address] [place] ?Home Phone: [phone] ?Mobile Phone: [phone] ?Relation: Spouse ?Secondary Emergency Contact: Loeb,Adam ?Mobile Phone: [phone] ?Relation: Son ? ?Code Status:  DNR ?Goals of care: Advanced Directive information ? ?  12/06/2021  ? 11:10 AM  ?Advanced Directives  ?Does Patient Have a Medical Advance Directive? Yes  ?Type of Advance Directive Living will;Healthcare Power of [place];Out of facility DNR (pink MOST or yellow form)  ?Does patient want to make changes to medical advance directive? No - Patient declined  ?Copy of [place] in Chart? Yes - validated most recent copy scanned in chart (See row information)  ? ? ? ?Chief Complaint  ?Patient presents with  ? Acute Visit  ?  Orthostatic Hypotension   ? ? ?HPI:  ?Pt is a 81 y.o. female seen today for an acute visit for orthostatic hypotension. 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 Nurse wrote an SBAR indicating that her BP was 57/37 HR 61 (taken by machine) after a fall with no injury. They put her feet up and rechecked it and got 110/62 HR 71.  She felt dizzy at the time.  ?They have been taking her bp each day as follows: ?136/74 sitting, 144/75 standing ?136/72 sitting, 137/76 standing.  ?She has not had any further low bps or dizziness. She is eating and drinking well.  ?Currently she is on seroquel for agitation/delirium which was started in rehab several months ago. She has adjusted well to memory care. No issues with agitation. She does  have some sleep issues. Melatonin was increased and her sleep improved.  ? ? ?Past Medical History:  ?Diagnosis Date  ? Breast cancer ([place]) 1993  ? bilateral mastectomy; no chemo or radiation  ? Glaucoma   ? Hyperlipidemia   ? Hypertension   ? ?Past Surgical History:  ?Procedure Laterality Date  ? MASTECTOMY Bilateral 1993  ? TONSILLECTOMY AND ADENOIDECTOMY  1947  ? TUBAL LIGATION  1974  ? ? ?Allergies  ?Allergen Reactions  ? Crab [Shellfish Allergy] Other (See Comments)  ?  From skin test  ? Fosamax [Alendronate Sodium] Other (See Comments)  ?  Difficult swallowing  ? Hydrochlorothiazide Other (See Comments)  ?  hyponatremia  ? Losartan Cough  ? Raloxifene Other (See Comments)  ?  Unknown reaction  ? Ace Inhibitors Cough  ?  Reaction to losartan (pt tolerates valsartan)  ? ? ?Outpatient Encounter Medications as of 12/06/2021  ?Medication Sig  ? acetaminophen (TYLENOL) 325 MG tablet Take 2 tablets (650 mg total) by mouth every 4 (four) hours as needed for mild pain (or temp > 37.5 C (99.5 F)).  ? amLODipine (NORVASC) 5 MG tablet Take 1 tablet (5 mg total) by mouth daily.  ? brinzolamide (AZOPT) 1 % ophthalmic suspension Place 1 drop into both eyes 2 (two) times daily.   ? clopidogrel (PLAVIX) 75 MG tablet Plavix 75 mg daily beginning 07/26/2021  ? LORazepam (ATIVAN) 0.5 MG tablet Take 0.25 mg by mouth every 8 (eight) hours as needed for anxiety.  ? melatonin 3 MG TABS tablet  Take 6 mg by mouth at bedtime.  ? mirtazapine (REMERON) 15 MG tablet Take 15 mg by mouth at bedtime.  ? polyethylene glycol (MIRALAX / GLYCOLAX) 17 g packet Take 17 g by mouth daily as needed.  ? QUEtiapine (SEROQUEL) 12.5 mg TABS tablet Take 12.5 mg by mouth 2 (two) times daily.  ? rosuvastatin (CRESTOR) 10 MG tablet Take 1 tablet (10 mg total) by mouth daily.  ? senna-docusate (SENOKOT-S) 8.6-50 MG tablet Take 1 tablet by mouth at bedtime as needed for mild constipation.  ? timolol (TIMOPTIC) 0.5 % ophthalmic solution Place 1 drop into both  eyes 2 (two) times daily.  ? valsartan (DIOVAN) 320 MG tablet Take 1 tablet (320 mg total) by mouth daily.  ? Vitamins A & D (VITAMIN A & D) ointment Apply 1 application. topically as needed for dry skin.  ? [DISCONTINUED] mirtazapine (REMERON) 7.5 MG tablet Take 15 mg by mouth at bedtime.  ? ?No facility-administered encounter medications on file as of 12/06/2021.  ? ? ?Review of Systems ? ?Immunization History  ?Administered Date(s) Administered  ? H1N1 08/23/2008  ? Influenza Split 05/17/2008, 06/02/2009, 04/19/2011  ? Influenza,inj,quad, With Preservative 04/06/2014  ? Influenza-Unspecified 05/14/2012, 04/05/2013, 04/13/2015, 05/09/2016, 05/15/2017, 06/30/2018, 04/10/2019, 06/03/2020, 04/11/2021  ? Moderna Covid-19 Vaccine Bivalent Booster 67yr & up 05/11/2021  ? Moderna SARS-COV2 Booster Vaccination 01/16/2021  ? Moderna Sars-Covid-2 Vaccination 08/10/2019, 09/07/2019, 06/13/2020, 01/16/2021  ? Pneumococcal Conjugate-13 04/06/2014  ? Pneumococcal Polysaccharide-23 08/20/2007  ? Td 11/27/1998, 09/25/2020  ? Tdap 08/20/2007  ? Zoster Recombinat (Shingrix) 11/14/2016, 05/22/2017  ? Zoster, Live 02/26/2006, 11/14/2016, 05/22/2017  ? ?Pertinent  Health Maintenance Due  ?Topic Date Due  ? INFLUENZA VACCINE  02/26/2022  ? DEXA SCAN  Completed  ? ? ?  08/26/2021  ?  8:15 PM 08/27/2021  ?  7:05 AM 08/27/2021  ?  8:58 PM 08/28/2021  ?  7:46 AM 08/28/2021  ?  8:21 AM  ?Fall Risk  ?Patient Fall Risk Level High fall risk High fall risk High fall risk High fall risk High fall risk  ? ?Functional Status Survey: ?  ? ?Vitals:  ? 12/06/21 1107  ?BP: (!) 147/84  ?Pulse: 79  ?Resp: 16  ?Temp: 97.7 ?F (36.5 ?C)  ?SpO2: 96%  ?Weight: 149 lb (67.6 kg)  ?Height: '5\' 5"'$  (1.651 m)  ? ?Body mass index is 24.79 kg/m?.Marland Kitchen?Physical Exam ? ?Labs reviewed: ?Recent Labs  ?  06/26/21 ?0416 06/27/21 ?0474212/01/22 ?0131 06/29/21 ?0629 08/24/21 ?1055 08/25/21 ?0329 08/26/21 ?1316 08/27/21 ?0327 08/27/21 ?1241 08/28/21 ?0329 08/30/21 ?0000 09/07/21 ?0000  10/08/21 ?0000 10/12/21 ?0000  ?NA 131* 129* 129*   < > 143   < > 128* 130*   < > 132*   < > 136* 137 138  ?K 3.6 3.4* 3.6   < > 3.4*   < > 3.7 3.5  --  3.5   < > 4.4 4.2 4.3  ?CL 99 97* 97*   < > 109   < > 93* 98  --  99   < > 101 100 100  ?CO2 '25 24 25   '$ < > 25   < > 27 25  --  25   < > 23* 24* 29*  ?GLUCOSE 113* 118* 114*   < > 130*   < > 117* 105*  --  103*  --   --   --   --   ?BUN '11 8 15   '$ < > 27*   < >  16 13  --  19   < > '9 11 10  '$ ?CREATININE 0.53 0.53 0.61   < > 0.75   < > 0.65 0.39*  --  0.37*   < > 0.4* 0.5 0.5  ?CALCIUM 8.9 8.6* 8.9   < > 8.1*   < > 8.6* 8.0*  --  8.6*   < > 8.8 9.6 9.9  ?MG 1.8 2.0 2.1  --  1.9  --   --   --   --   --   --   --   --   --   ?PHOS 3.0 2.5 3.4  --   --   --   --   --   --   --   --   --   --   --   ? < > = values in this interval not displayed.  ? ?Recent Labs  ?  06/29/21 ?0034 07/08/21 ?1815 08/23/21 ?1030  ?AST '21 25 19  '$ ?ALT 31 32 24  ?ALKPHOS 41 48 35*  ?BILITOT 0.7 0.8 0.7  ?PROT 6.2* 6.7 6.8  ?ALBUMIN 3.5 3.7 4.3  ? ?Recent Labs  ?  06/29/21 ?9179 07/08/21 ?1815 07/08/21 ?1821 08/23/21 ?1030 08/24/21 ?1505 08/25/21 ?6979 08/26/21 ?4801  ?WBC 8.9 10.9*   < > 12.2* 8.2 11.6* 10.3  ?NEUTROABS 5.6 7.7  --  9.4*  --   --   --   ?HGB 13.2 14.2   < > 14.2 13.6 13.7 15.8*  ?HCT 39.8 42.4   < > 38.8 37.7 39.5 45.1  ?MCV 88.8 90.8   < > 81.9 85.1 87.8 86.6  ?PLT 360 414*   < > 423* 365 349 352  ? < > = values in this interval not displayed.  ? ?Lab Results  ?Component Value Date  ? TSH 1.117 06/25/2021  ? ?Lab Results  ?Component Value Date  ? HGBA1C 5.7 (H) 07/09/2021  ? ?Lab Results  ?Component Value Date  ? CHOL 120 07/09/2021  ? HDL 65 07/09/2021  ? [place] 39 07/09/2021  ? TRIG 79 07/09/2021  ? CHOLHDL 1.8 07/09/2021  ? ? ?Significant Diagnostic Results in last 30 days:  ?No results found. ? ?Assessment/Plan ? ?1. Orthostatic hypotension ?One time episode. BPs are WNL at this time ?Will check labs ?Encourage to rise slowly.  ?WIll reduce seroquel to 12.5 mg qhs due to  behaviors improved and she likely no longer needs it, addition to possible s/e. ?If reoccurs may need to f/u with cardiology ? ?2. Essential hypertension ?BPs WNL now ?Could consider dose reduction of norv

## 2021-12-07 DIAGNOSIS — I10 Essential (primary) hypertension: Secondary | ICD-10-CM | POA: Diagnosis not present

## 2021-12-07 LAB — BASIC METABOLIC PANEL
BUN: 13 (ref 4–21)
CO2: 26 — AB (ref 13–22)
Chloride: 102 (ref 99–108)
Creatinine: 0.5 (ref 0.5–1.1)
Glucose: 96
Potassium: 4.4 mEq/L (ref 3.5–5.1)
Sodium: 137 (ref 137–147)

## 2021-12-07 LAB — COMPREHENSIVE METABOLIC PANEL: Calcium: 9.8 (ref 8.7–10.7)

## 2021-12-11 ENCOUNTER — Encounter: Payer: Self-pay | Admitting: Orthopedic Surgery

## 2021-12-11 ENCOUNTER — Non-Acute Institutional Stay (SKILLED_NURSING_FACILITY): Payer: Medicare Other | Admitting: Orthopedic Surgery

## 2021-12-11 DIAGNOSIS — K5901 Slow transit constipation: Secondary | ICD-10-CM

## 2021-12-11 DIAGNOSIS — I679 Cerebrovascular disease, unspecified: Secondary | ICD-10-CM

## 2021-12-11 DIAGNOSIS — R1312 Dysphagia, oropharyngeal phase: Secondary | ICD-10-CM | POA: Diagnosis not present

## 2021-12-11 DIAGNOSIS — F419 Anxiety disorder, unspecified: Secondary | ICD-10-CM | POA: Diagnosis not present

## 2021-12-11 DIAGNOSIS — I10 Essential (primary) hypertension: Secondary | ICD-10-CM

## 2021-12-11 DIAGNOSIS — R4689 Other symptoms and signs involving appearance and behavior: Secondary | ICD-10-CM

## 2021-12-11 DIAGNOSIS — I951 Orthostatic hypotension: Secondary | ICD-10-CM

## 2021-12-11 DIAGNOSIS — E871 Hypo-osmolality and hyponatremia: Secondary | ICD-10-CM

## 2021-12-11 DIAGNOSIS — F32A Depression, unspecified: Secondary | ICD-10-CM | POA: Diagnosis not present

## 2021-12-11 DIAGNOSIS — E7800 Pure hypercholesterolemia, unspecified: Secondary | ICD-10-CM | POA: Diagnosis not present

## 2021-12-11 DIAGNOSIS — R4189 Other symptoms and signs involving cognitive functions and awareness: Secondary | ICD-10-CM | POA: Diagnosis not present

## 2021-12-11 NOTE — Progress Notes (Signed)
?Location:  [facility] ?Nursing Home Room Number: 638/G ?Place of Service:  SNF (31) ?Provider: Yvonna Alanis, NP ? ?Patient Care Team: ?Virgie Dad, MD as PCP - General (Internal Medicine) ?Jerline Pain, MD as PCP - Cardiology (Cardiology) ? ?Extended Emergency Contact Information ?Primary Emergency Contact: Seebeck,Maurice ?Address: [address] ?         [place], [place] [address] [place] ?Home Phone: [phone] ?Mobile Phone: [phone] ?Relation: Spouse ?Secondary Emergency Contact: Nebergall,Adam ?Mobile Phone: [phone] ?Relation: Son ? ?Code Status:  DNR ?Goals of care: Advanced Directive information ? ?  12/11/2021  ? 10:21 AM  ?Advanced Directives  ?Does Patient Have a Medical Advance Directive? Yes  ?Type of Advance Directive Living will;Healthcare Power of [place];Out of facility DNR (pink MOST or yellow form)  ?Does patient want to make changes to medical advance directive? No - Patient declined  ?Copy of [place] in Chart? Yes - validated most recent copy scanned in chart (See row information)  ? ? ? ?Chief Complaint  ?Patient presents with  ? Medical Management of Chronic Issues  ?  Routine visit.  ? ? ?HPI:  ?Pt is a 81 y.o. female seen today for medical management of chronic diseases.  ? ?She currently resides on the memory care unit at [facility] due to cognitive changes. PMH: HTN, HLD, CVA (05/2021), HTN, prediabetes, dysphagia, OA, osteoporosis, depression, and generalized weakness.  ? ?Cognitive changes- moved to memory care due to behaviors, MMSE 23/30 09/2021, remains on Seroquel and Remeron ?CVA- h/o CVA 06/24/2021- MRI revealed infarct to left basal ganglia, she had right sided weakness and aphasia- improved, remains on Plavix and statin ?HTN- BUN/creat 13/0.5 12/07/2021, remains on amlodipine ?Hypotension- dizziness 05/09, orthostatic blood pressures normal, no further episodes ?Hyponatremia- Na+ 137 (05/12)>  138 (03/17)> 137 (03/13) ?HLD- LDL 39 07/09/2021, remains on statin ?Anxiety/depression- no mood changes or panic attacks, remains on Remeron and Ativan prn ?Dysphagia- no recent aspirations, remains on regular diet with hand cut foods ?Constipation- LBM 05/14, remains on miralax and senna prn ? ?Recent blood pressures: ? 05/12- 127/73, 130/72 ? 05/03- 147/84 ? ?Recent weights: ? 05/01- not listed ? 04/02- 149 lbs ? 03/01- 144.4 lbs ? 02/01- 140.2 lbs ?  ? ? ? ?Past Medical History:  ?Diagnosis Date  ? Breast cancer ([place]) 1993  ? bilateral mastectomy; no chemo or radiation  ? Glaucoma   ? Hyperlipidemia   ? Hypertension   ? ?Past Surgical History:  ?Procedure Laterality Date  ? MASTECTOMY Bilateral 1993  ? TONSILLECTOMY AND ADENOIDECTOMY  1947  ? TUBAL LIGATION  1974  ? ? ?Allergies  ?Allergen Reactions  ? Crab [Shellfish Allergy] Other (See Comments)  ?  From skin test  ? Fosamax [Alendronate Sodium] Other (See Comments)  ?  Difficult swallowing  ? Hydrochlorothiazide Other (See Comments)  ?  hyponatremia  ? Losartan Cough  ? Raloxifene Other (See Comments)  ?  Unknown reaction  ? Ace Inhibitors Cough  ?  Reaction to losartan (pt tolerates valsartan)  ? ? ?Outpatient Encounter Medications as of 12/11/2021  ?Medication Sig  ? acetaminophen (TYLENOL) 325 MG tablet Take 2 tablets (650 mg total) by mouth every 4 (four) hours as needed for mild pain (or temp > 37.5 C (99.5 F)).  ? amLODipine (NORVASC) 5 MG tablet Take 1 tablet (5 mg total) by mouth daily.  ? brinzolamide (AZOPT) 1 % ophthalmic suspension Place 1 drop into both eyes 2 (two)  times daily.   ? clopidogrel (PLAVIX) 75 MG tablet Plavix 75 mg daily beginning 07/26/2021  ? LORazepam (ATIVAN) 0.5 MG tablet Take 0.25 mg by mouth every 8 (eight) hours as needed for anxiety.  ? melatonin 3 MG TABS tablet Take 6 mg by mouth at bedtime.  ? mirtazapine (REMERON) 15 MG tablet Take 15 mg by mouth at bedtime.  ? polyethylene glycol (MIRALAX / GLYCOLAX) 17 g packet Take 17  g by mouth daily as needed.  ? QUEtiapine (SEROQUEL) 12.5 mg TABS tablet Take 12.5 mg by mouth daily.  ? rosuvastatin (CRESTOR) 10 MG tablet Take 1 tablet (10 mg total) by mouth daily.  ? senna-docusate (SENOKOT-S) 8.6-50 MG tablet Take 1 tablet by mouth at bedtime as needed for mild constipation.  ? timolol (TIMOPTIC) 0.5 % ophthalmic solution Place 1 drop into both eyes 2 (two) times daily.  ? valsartan (DIOVAN) 320 MG tablet Take 1 tablet (320 mg total) by mouth daily.  ? Vitamins A & D (VITAMIN A & D) ointment Apply 1 application. topically as needed for dry skin.  ? ?No facility-administered encounter medications on file as of 12/11/2021.  ? ? ?Review of Systems  ?Unable to perform ROS: Dementia  ? ?Immunization History  ?Administered Date(s) Administered  ? H1N1 08/23/2008  ? Influenza Split 05/17/2008, 06/02/2009, 04/19/2011  ? Influenza,inj,quad, With Preservative 04/06/2014  ? Influenza-Unspecified 05/14/2012, 04/05/2013, 04/13/2015, 05/09/2016, 05/15/2017, 06/30/2018, 04/10/2019, 06/03/2020, 04/11/2021  ? Moderna Covid-19 Vaccine Bivalent Booster 42yr & up 05/11/2021  ? Moderna SARS-COV2 Booster Vaccination 01/16/2021  ? Moderna Sars-Covid-2 Vaccination 08/10/2019, 09/07/2019, 06/13/2020, 01/16/2021  ? Pneumococcal Conjugate-13 04/06/2014  ? Pneumococcal Polysaccharide-23 08/20/2007  ? Td 11/27/1998, 09/25/2020  ? Tdap 08/20/2007  ? Zoster Recombinat (Shingrix) 11/14/2016, 05/22/2017  ? Zoster, Live 02/26/2006, 11/14/2016, 05/22/2017  ? ?Pertinent  Health Maintenance Due  ?Topic Date Due  ? INFLUENZA VACCINE  02/26/2022  ? DEXA SCAN  Completed  ? ? ?  08/26/2021  ?  8:15 PM 08/27/2021  ?  7:05 AM 08/27/2021  ?  8:58 PM 08/28/2021  ?  7:46 AM 08/28/2021  ?  8:21 AM  ?Fall Risk  ?Patient Fall Risk Level High fall risk High fall risk High fall risk High fall risk High fall risk  ? ?Functional Status Survey: ?  ? ?Vitals:  ? 12/11/21 1014  ?BP: 127/73  ?Pulse: (!) 101  ?Resp: 16  ?Temp: 97.7 ?F (36.5 ?C)  ?SpO2:  96%  ?Weight: 149 lb (67.6 kg)  ?Height: '5\' 5"'$  (1.651 m)  ? ?Body mass index is 24.79 kg/m?.Marland Kitchen?Physical Exam ?Vitals reviewed.  ?Constitutional:   ?   General: She is not in acute distress. ?HENT:  ?   Head: Normocephalic.  ?   Right Ear: There is no impacted cerumen.  ?   Left Ear: There is no impacted cerumen.  ?   Ears:  ?   Comments: Bilateral hearing aids ?   Nose: Nose normal.  ?   Mouth/Throat:  ?   Mouth: Mucous membranes are moist.  ?Eyes:  ?   General:     ?   Right eye: No discharge.     ?   Left eye: No discharge.  ?   Comments: glasses  ?Cardiovascular:  ?   Rate and Rhythm: Normal rate and regular rhythm.  ?   Pulses: Normal pulses.  ?   Heart sounds: Normal heart sounds.  ?Pulmonary:  ?   Effort: Pulmonary effort is normal. No respiratory distress.  ?  Breath sounds: Normal breath sounds. No wheezing.  ?Abdominal:  ?   General: Bowel sounds are normal. There is no distension.  ?   Palpations: Abdomen is soft.  ?   Tenderness: There is no abdominal tenderness.  ?Musculoskeletal:  ?   Cervical back: Neck supple.  ?   Right lower leg: No edema.  ?   Left lower leg: No edema.  ?Feet:  ?   Right foot:  ?   Protective Sensation: 10 sites tested.  10 sites sensed.  ?   Skin integrity: Skin integrity normal.  ?   Toenail Condition: Right toenails are normal.  ?   Left foot:  ?   Protective Sensation: 10 sites tested.  10 sites sensed.  ?   Skin integrity: Skin integrity normal.  ?   Toenail Condition: Left toenails are normal.  ?Skin: ?   General: Skin is warm and dry.  ?   Capillary Refill: Capillary refill takes less than 2 seconds.  ?Neurological:  ?   General: No focal deficit present.  ?   Mental Status: She is alert. Mental status is at baseline.  ?   Motor: Weakness present.  ?   Gait: Gait abnormal.  ?   Comments: Walker, right hand grip 4/5, left hand grip 5/5  ?Psychiatric:  ?   Comments: Very pleasant, follows commands, alert to self/person/place  ? ? ?Labs reviewed: ?Recent Labs  ?  06/26/21 ?0416  06/27/21 ?1324 06/28/21 ?0131 06/29/21 ?0629 08/24/21 ?1055 08/25/21 ?0329 08/26/21 ?1316 08/27/21 ?0327 08/27/21 ?1241 08/28/21 ?0329 08/30/21 ?0000 10/08/21 ?0000 10/12/21 ?0000 12/07/21 ?0000  ?NA

## 2022-01-21 ENCOUNTER — Non-Acute Institutional Stay (SKILLED_NURSING_FACILITY): Payer: Medicare Other | Admitting: Internal Medicine

## 2022-01-21 ENCOUNTER — Encounter: Payer: Self-pay | Admitting: Internal Medicine

## 2022-01-21 DIAGNOSIS — F32A Depression, unspecified: Secondary | ICD-10-CM | POA: Diagnosis not present

## 2022-01-21 DIAGNOSIS — E7800 Pure hypercholesterolemia, unspecified: Secondary | ICD-10-CM

## 2022-01-21 DIAGNOSIS — F419 Anxiety disorder, unspecified: Secondary | ICD-10-CM

## 2022-01-21 DIAGNOSIS — R4689 Other symptoms and signs involving appearance and behavior: Secondary | ICD-10-CM | POA: Diagnosis not present

## 2022-01-21 DIAGNOSIS — R4189 Other symptoms and signs involving cognitive functions and awareness: Secondary | ICD-10-CM

## 2022-01-21 DIAGNOSIS — I679 Cerebrovascular disease, unspecified: Secondary | ICD-10-CM

## 2022-01-21 DIAGNOSIS — I10 Essential (primary) hypertension: Secondary | ICD-10-CM | POA: Diagnosis not present

## 2022-01-30 DIAGNOSIS — M6389 Disorders of muscle in diseases classified elsewhere, multiple sites: Secondary | ICD-10-CM | POA: Diagnosis not present

## 2022-01-30 DIAGNOSIS — G458 Other transient cerebral ischemic attacks and related syndromes: Secondary | ICD-10-CM | POA: Diagnosis not present

## 2022-01-30 DIAGNOSIS — R278 Other lack of coordination: Secondary | ICD-10-CM | POA: Diagnosis not present

## 2022-02-08 DIAGNOSIS — G458 Other transient cerebral ischemic attacks and related syndromes: Secondary | ICD-10-CM | POA: Diagnosis not present

## 2022-02-08 DIAGNOSIS — R278 Other lack of coordination: Secondary | ICD-10-CM | POA: Diagnosis not present

## 2022-02-08 DIAGNOSIS — M6389 Disorders of muscle in diseases classified elsewhere, multiple sites: Secondary | ICD-10-CM | POA: Diagnosis not present

## 2022-02-20 DIAGNOSIS — R278 Other lack of coordination: Secondary | ICD-10-CM | POA: Diagnosis not present

## 2022-02-20 DIAGNOSIS — G458 Other transient cerebral ischemic attacks and related syndromes: Secondary | ICD-10-CM | POA: Diagnosis not present

## 2022-02-20 DIAGNOSIS — M6389 Disorders of muscle in diseases classified elsewhere, multiple sites: Secondary | ICD-10-CM | POA: Diagnosis not present

## 2022-02-22 DIAGNOSIS — M810 Age-related osteoporosis without current pathological fracture: Secondary | ICD-10-CM | POA: Diagnosis not present

## 2022-02-22 DIAGNOSIS — E7800 Pure hypercholesterolemia, unspecified: Secondary | ICD-10-CM | POA: Diagnosis not present

## 2022-02-22 DIAGNOSIS — F325 Major depressive disorder, single episode, in full remission: Secondary | ICD-10-CM | POA: Diagnosis not present

## 2022-02-22 DIAGNOSIS — I6930 Unspecified sequelae of cerebral infarction: Secondary | ICD-10-CM | POA: Diagnosis not present

## 2022-02-22 DIAGNOSIS — I10 Essential (primary) hypertension: Secondary | ICD-10-CM | POA: Diagnosis not present

## 2022-02-24 ENCOUNTER — Emergency Department (HOSPITAL_COMMUNITY)
Admission: EM | Admit: 2022-02-24 | Discharge: 2022-02-25 | Disposition: A | Discharge status: Home / Self Care | Payer: Medicare Other | Attending: Emergency Medicine | Admitting: Emergency Medicine

## 2022-02-24 ENCOUNTER — Other Ambulatory Visit: Payer: Self-pay

## 2022-02-24 ENCOUNTER — Emergency Department (HOSPITAL_COMMUNITY): Payer: Medicare Other

## 2022-02-24 DIAGNOSIS — N390 Urinary tract infection, site not specified: Secondary | ICD-10-CM | POA: Diagnosis not present

## 2022-02-24 DIAGNOSIS — Z7901 Long term (current) use of anticoagulants: Secondary | ICD-10-CM | POA: Diagnosis not present

## 2022-02-24 DIAGNOSIS — Z79899 Other long term (current) drug therapy: Secondary | ICD-10-CM | POA: Insufficient documentation

## 2022-02-24 DIAGNOSIS — I959 Hypotension, unspecified: Secondary | ICD-10-CM | POA: Diagnosis not present

## 2022-02-24 DIAGNOSIS — I10 Essential (primary) hypertension: Secondary | ICD-10-CM | POA: Insufficient documentation

## 2022-02-24 DIAGNOSIS — R531 Weakness: Secondary | ICD-10-CM | POA: Diagnosis not present

## 2022-02-24 DIAGNOSIS — E860 Dehydration: Secondary | ICD-10-CM | POA: Diagnosis not present

## 2022-02-24 LAB — CBC WITH DIFFERENTIAL/PLATELET
Abs Immature Granulocytes: 0.04 10*3/uL (ref 0.00–0.07)
Basophils Absolute: 0 10*3/uL (ref 0.0–0.1)
Basophils Relative: 1 %
Eosinophils Absolute: 0.2 10*3/uL (ref 0.0–0.5)
Eosinophils Relative: 3 %
HCT: 38.8 % (ref 36.0–46.0)
Hemoglobin: 12.5 g/dL (ref 12.0–15.0)
Immature Granulocytes: 1 %
Lymphocytes Relative: 26 %
Lymphs Abs: 2.1 10*3/uL (ref 0.7–4.0)
MCH: 29.2 pg (ref 26.0–34.0)
MCHC: 32.2 g/dL (ref 30.0–36.0)
MCV: 90.7 fL (ref 80.0–100.0)
Monocytes Absolute: 0.6 10*3/uL (ref 0.1–1.0)
Monocytes Relative: 8 %
Neutro Abs: 5 10*3/uL (ref 1.7–7.7)
Neutrophils Relative %: 61 %
Platelets: 288 10*3/uL (ref 150–400)
RBC: 4.28 MIL/uL (ref 3.87–5.11)
RDW: 13.2 % (ref 11.5–15.5)
WBC: 8 10*3/uL (ref 4.0–10.5)
nRBC: 0 % (ref 0.0–0.2)

## 2022-02-24 LAB — COMPREHENSIVE METABOLIC PANEL
ALT: 28 U/L (ref 0–44)
AST: 24 U/L (ref 15–41)
Albumin: 3.3 g/dL — ABNORMAL LOW (ref 3.5–5.0)
Alkaline Phosphatase: 54 U/L (ref 38–126)
Anion gap: 7 (ref 5–15)
BUN: 10 mg/dL (ref 8–23)
CO2: 22 mmol/L (ref 22–32)
Calcium: 8.2 mg/dL — ABNORMAL LOW (ref 8.9–10.3)
Chloride: 108 mmol/L (ref 98–111)
Creatinine, Ser: 0.67 mg/dL (ref 0.44–1.00)
GFR, Estimated: >60 mL/min (ref >60)
Glucose, Bld: 147 mg/dL — ABNORMAL HIGH (ref 70–99)
Potassium: 3.5 mmol/L (ref 3.5–5.1)
Sodium: 137 mmol/L (ref 135–145)
Total Bilirubin: 0.7 mg/dL (ref 0.3–1.2)
Total Protein: 5.6 g/dL — ABNORMAL LOW (ref 6.5–8.1)

## 2022-02-24 LAB — URINALYSIS, ROUTINE W REFLEX MICROSCOPIC
Bilirubin Urine: NEGATIVE
Glucose, UA: NEGATIVE mg/dL
Hgb urine dipstick: NEGATIVE
Ketones, ur: NEGATIVE mg/dL
Nitrite: NEGATIVE
Protein, ur: 100 mg/dL — AB
Specific Gravity, Urine: 1.013 (ref 1.005–1.030)
pH: 6 (ref 5.0–8.0)

## 2022-02-24 LAB — TROPONIN I (HIGH SENSITIVITY): Troponin I (High Sensitivity): 4 ng/L (ref <18)

## 2022-02-24 MED ORDER — SODIUM CHLORIDE 0.9 % IV SOLN
1.0000 g | Freq: Once | INTRAVENOUS | Status: AC
  Administered 2022-02-25: 1 g via INTRAVENOUS
  Filled 2022-02-24: qty 10

## 2022-02-24 MED ORDER — CEFDINIR 300 MG PO CAPS: 300.0000 mg | ORAL_CAPSULE | Freq: Two times a day (BID) | ORAL | 0 refills | Status: AC

## 2022-02-24 NOTE — ED Triage Notes (Signed)
Pt was with family when they noticed she began to nod off while speaking. When ems got there bp was 80/52. Received 500 ns and bp 120/52. Cbg 122. Pt states that she feels much better at this time

## 2022-02-24 NOTE — ED Provider Notes (Incomplete)
[facility] EMERGENCY DEPARTMENT Provider Note   CSN: [id number] Arrival date & time: 02/24/22  2131     History  Chief Complaint  Patient presents with   Hypotension    Sandra Becker is a 81 y.o. female with history of stroke in November, hypertension, hyperlipidemia presenting with low blood pressure.  The patient reports that had some left-sided low back pain, took some Tylenol.  She moved to a different seat in the room, developed lightheadedness and fatigue.  Husband denies that the patient had any difficulty with speech other than speaking slowly, specifically no facial droop, dysarthria, abnormal words.  Patient was following commands.  EMS was called, and her initial blood pressure was low, so she was given fluids.  She reports currently, she feels much improved.  She denies any symptoms currently such as chest pain, nausea, vomiting, headache, abdominal pain, diarrhea, dysuria, leg swelling, back pain.  She never lost consciousness during this episode  HPI     Home Medications Prior to Admission medications   Medication Sig Start Date End Date Taking? Authorizing Provider  acetaminophen (TYLENOL) 325 MG tablet Take 2 tablets (650 mg total) by mouth every 4 (four) hours as needed for mild pain (or temp > 37.5 C (99.5 F)). 07/05/21  Yes Angiulli, Lavon Paganini, PA-C  amLODipine (NORVASC) 5 MG tablet Take 1 tablet (5 mg total) by mouth daily. 07/10/21  Yes Bonnielee Haff, MD  brinzolamide (AZOPT) 1 % ophthalmic suspension Place 1 drop into both eyes 2 (two) times daily.    Yes [provider]  cefdinir (OMNICEF) 300 MG capsule Take 1 capsule (300 mg total) by mouth 2 (two) times daily for 7 days. 02/24/22 03/03/22 Yes Cristie Hem, MD  clopidogrel (PLAVIX) 75 MG tablet Plavix 75 mg daily beginning 07/26/2021 07/05/21  Yes Angiulli, Lavon Paganini, PA-C  LORazepam (ATIVAN) 0.5 MG tablet Take 0.25 mg by mouth every 8 (eight) hours as needed for anxiety.   Yes  [provider]  melatonin 3 MG TABS tablet Take 6 mg by mouth at bedtime as needed (sleep).   Yes [provider]  mirtazapine (REMERON) 15 MG tablet Take 15 mg by mouth at bedtime.   Yes [provider]  polyethylene glycol (MIRALAX / GLYCOLAX) 17 g packet Take 17 g by mouth daily as needed for moderate constipation.   Yes [provider]  QUEtiapine (SEROQUEL) 12.5 mg TABS tablet Take 12.5 mg by mouth daily.   Yes [provider]  rosuvastatin (CRESTOR) 10 MG tablet Take 1 tablet (10 mg total) by mouth daily. 07/05/21  Yes Angiulli, Lavon Paganini, PA-C  senna-docusate (SENOKOT-S) 8.6-50 MG tablet Take 1 tablet by mouth at bedtime as needed for mild constipation. 08/28/21  Yes Annita Brod, MD  timolol (TIMOPTIC) 0.5 % ophthalmic solution Place 1 drop into both eyes 2 (two) times daily. 02/12/21  Yes [provider]  valsartan (DIOVAN) 320 MG tablet Take 1 tablet (320 mg total) by mouth daily. 07/05/21  Yes Angiulli, Lavon Paganini, PA-C  Vitamins A & D (VITAMIN A & D) ointment Apply 1 application. topically as needed for dry skin.   Yes [provider]      Allergies    Otho Darner allergy], Fosamax [alendronate sodium], Hydrochlorothiazide, Losartan, Raloxifene, Serotonin reuptake inhibitors (ssris), and Ace inhibitors    Review of Systems   Review of Systems See HPI   Physical Exam Updated Vital Signs BP (!) 120/50   Pulse 72  Temp 97.6 F (36.4 C) (Oral)   Resp 13   Ht '5\' 4"'$  (1.626 m)   Wt 72.6 kg   SpO2 100%   BMI 27.46 kg/m  Physical Exam Constitutional:      General: She is not in acute distress.    Appearance: She is well-developed.  HENT:     Head: Normocephalic and atraumatic.     Mouth/Throat:     Mouth: Mucous membranes are moist.  Eyes:     Pupils: Pupils are equal, round, and reactive to light.  Cardiovascular:     Rate and Rhythm: Normal rate and regular rhythm.     Heart sounds: No murmur  heard. Pulmonary:     Effort: Pulmonary effort is normal. No respiratory distress.     Breath sounds: Normal breath sounds.  Abdominal:     General: Abdomen is flat.     Palpations: Abdomen is soft.     Tenderness: There is no abdominal tenderness. There is no right CVA tenderness or left CVA tenderness.  Musculoskeletal:        General: No tenderness.     Right lower leg: No edema.     Left lower leg: No edema.  Skin:    General: Skin is warm and dry.  Neurological:     General: No focal deficit present.     Mental Status: She is alert. Mental status is at baseline.     Comments: Cranial nerves II through XII intact, strength 5 out of 5 in the bilateral upper and lower extremities, no sensory deficit to light touch, no dysmetria on finger-nose-finger testing, ambulatory with steady gait.   Psychiatric:        Mood and Affect: Mood normal.        Behavior: Behavior normal.     ED Results / Procedures / Treatments   Labs (all labs ordered are listed, but only abnormal results are displayed) Labs Reviewed  COMPREHENSIVE METABOLIC PANEL - Abnormal; Notable for the following components:      Result Value   Glucose, Bld 147 (*)    Calcium 8.2 (*)    Total Protein 5.6 (*)    Albumin 3.3 (*)    All other components within normal limits  URINALYSIS, ROUTINE W REFLEX MICROSCOPIC - Abnormal; Notable for the following components:   APPearance HAZY (*)    Protein, ur 100 (*)    Leukocytes,Ua SMALL (*)    Bacteria, UA RARE (*)    Non Squamous Epithelial 0-5 (*)    All other components within normal limits  URINE CULTURE  CBC WITH DIFFERENTIAL/PLATELET  TROPONIN I (HIGH SENSITIVITY)  TROPONIN I (HIGH SENSITIVITY)    EKG EKG Interpretation  Date/Time:  Sunday February 24 2022 21:37:49 EDT Ventricular Rate:  73 PR Interval:  153 QRS Duration: 93 QT Interval:  402 QTC Calculation: 443 R Axis:   44 Text Interpretation: Sinus rhythm Atrial premature complex Confirmed by Garnette Gunner [phone]) on 02/24/2022 11:30:38 PM  Radiology DG Chest 1 View  Result Date: 02/24/2022 CLINICAL DATA:  Hypotension EXAM: CHEST  1 VIEW COMPARISON:  Chest x-ray 09/25/2018 FINDINGS: The heart size and mediastinal contours are within normal limits. Both lungs are clear. The visualized skeletal structures are unremarkable. Left axillary surgical clips are again seen. IMPRESSION: No active disease. Electronically Signed   By: Ronney Asters M.D.   On: 02/24/2022 22:55    Procedures Procedures    Medications Ordered in ED Medications  cefTRIAXone (ROCEPHIN) 1 g in  sodium chloride 0.9 % 100 mL IVPB (has no administration in time range)    ED Course/ Medical Decision Making/ A&P Clinical Course as of 02/25/22 0032  Mon Feb 25, 2022  0001 Urinalysis, Routine w reflex microscopic Urine, Clean Catch(!) UA With leukocyte esterase, bacteria, WBC. Possible UTI as cause given flank pain. Patient reports has had UTI previously without burning sensation. Will give CTX 1gm. Will prescribe cefdinir. Pending repeat troponin, if negative, likely discharge home.  [WS]  0032 Signed out pending repeat troponin. [WS]    Clinical Course User Index [WS] Cristie Hem, MD                           Medical Decision Making Amount and/or Complexity of Data Reviewed Labs: ordered. Decision-making details documented in ED Course. Radiology: ordered.  Risk Prescription drug management.   81 year old female presenting to the emergency department with low blood pressure.  Currently, patient is well-appearing, no focal findings on exam including normal neurologic exam.  Unclear cause of low blood pressure, could be transient vasovagal episode or orthostatic, alternatively could represent dehydration.  Low concern for occult infectious process with no fevers, chills, normal exam, but will obtain chest x-ray, urinalysis.  Low concern for ACS or PE without chest pain or shortness of breath.  EKG  reassuring.  Doubt stroke or TIA, with normal neurologic exam, and no reported neurologic deficit at any time. Doubt occult aortic pathology with no ongoing pain, no abdominal pain, reassuring hemoglobin. Patient's blood pressure already improved with small amount of IV fluids.  We will follow-up labs.  If reassuring, and blood pressure remains normal, and patient remains asymptomatic, likely discharge.         Final Clinical Impression(s) / ED Diagnoses Final diagnoses:  Dehydration  Urinary tract infection without hematuria, site unspecified    Rx / DC Orders ED Discharge Orders          Ordered    cefdinir (OMNICEF) 300 MG capsule  2 times daily        02/24/22 2359              Cristie Hem, MD 02/25/22 0032    Cristie Hem, MD 02/25/22 [phone]

## 2022-02-25 DIAGNOSIS — H52203 Unspecified astigmatism, bilateral: Secondary | ICD-10-CM | POA: Diagnosis not present

## 2022-02-25 DIAGNOSIS — E860 Dehydration: Secondary | ICD-10-CM | POA: Diagnosis not present

## 2022-02-25 DIAGNOSIS — H18513 Endothelial corneal dystrophy, bilateral: Secondary | ICD-10-CM | POA: Diagnosis not present

## 2022-02-25 DIAGNOSIS — H401232 Low-tension glaucoma, bilateral, moderate stage: Secondary | ICD-10-CM | POA: Diagnosis not present

## 2022-02-25 DIAGNOSIS — H26492 Other secondary cataract, left eye: Secondary | ICD-10-CM | POA: Diagnosis not present

## 2022-02-25 LAB — TROPONIN I (HIGH SENSITIVITY): Troponin I (High Sensitivity): 3 ng/L (ref <18)

## 2022-02-25 LAB — URINE CULTURE: Culture: <10000 — AB

## 2022-02-25 NOTE — Discharge Instructions (Signed)
We evaluated you today in the emergency department for low blood pressure.  Your blood pressure was normal in the emergency department and your lab tests were reassuring.  Your symptoms may be related to a urinary infection.  We started you on antibiotics.  Please return to the emergency department if you experience any worsening or recurrent symptoms, or if you develop any new symptoms such as fainting, chest pain, abdominal pain, vomiting.  Please follow-up closely with your primary care physician.

## 2022-02-25 NOTE — ED Provider Notes (Signed)
Repeat troponin is negative.  Patient has been ambulatory.  She feels back to baseline.  No arm or leg drift.  No focal abdominal tenderness.  Distal pulses are equal and intact Patient and husband feel comfortable for discharge home.  Antibiotics for UTI already been sent.  We discussed strict return precautions   Ripley Fraise, MD 02/25/22 [phone]

## 2022-02-25 NOTE — ED Provider Notes (Signed)
Plan at signout to f/u on repeat troponin, ambulate patient, anticipate discharge.  Will treat for UTI   Ripley Fraise, MD 02/25/22 [phone]

## 2022-02-28 DIAGNOSIS — R8271 Bacteriuria: Secondary | ICD-10-CM | POA: Diagnosis not present

## 2022-02-28 DIAGNOSIS — I950 Idiopathic hypotension: Secondary | ICD-10-CM | POA: Diagnosis not present

## 2022-03-04 ENCOUNTER — Telehealth: Payer: Self-pay

## 2022-03-04 NOTE — Telephone Encounter (Signed)
        Patient  visited [facility] on 7/31  for UTI   Telephone encounter attempt :  1ST   A HIPAA compliant voice message was left requesting a return call.  Instructed patient to call back at  earliest convenience.   [place], Care Management  [phone] [address], [place], [place] [address] Phone: [phone] Email: [email or web address]

## 2022-03-05 ENCOUNTER — Telehealth: Payer: Self-pay

## 2022-03-05 NOTE — Telephone Encounter (Signed)
     Patient  visit on 02/25/2022  at [facility] was for UTI  Have you been able to follow up with your primary care physician? YES  The patient was or was not able to obtain any needed medicine or equipment.NA  Are there diet recommendations that you are having difficulty following?NA  Patient expresses understanding of discharge instructions and education provided has no other needs at this time. [place], Care Management  [phone] [address], [place], [place] [address] Phone: [phone] Email: [email or web address]

## 2022-03-06 DIAGNOSIS — H26492 Other secondary cataract, left eye: Secondary | ICD-10-CM | POA: Diagnosis not present

## 2022-03-19 DIAGNOSIS — U071 COVID-19: Secondary | ICD-10-CM | POA: Diagnosis not present

## 2022-04-02 DIAGNOSIS — M1612 Unilateral primary osteoarthritis, left hip: Secondary | ICD-10-CM | POA: Diagnosis not present

## 2022-04-16 DIAGNOSIS — M1612 Unilateral primary osteoarthritis, left hip: Secondary | ICD-10-CM | POA: Diagnosis not present

## 2022-04-16 DIAGNOSIS — M25552 Pain in left hip: Secondary | ICD-10-CM | POA: Diagnosis not present

## 2022-04-19 DIAGNOSIS — G458 Other transient cerebral ischemic attacks and related syndromes: Secondary | ICD-10-CM | POA: Diagnosis not present

## 2022-04-19 DIAGNOSIS — R2689 Other abnormalities of gait and mobility: Secondary | ICD-10-CM | POA: Diagnosis not present

## 2022-04-19 DIAGNOSIS — M1612 Unilateral primary osteoarthritis, left hip: Secondary | ICD-10-CM | POA: Diagnosis not present

## 2022-04-19 DIAGNOSIS — I6389 Other cerebral infarction: Secondary | ICD-10-CM | POA: Diagnosis not present

## 2022-04-19 DIAGNOSIS — M25551 Pain in right hip: Secondary | ICD-10-CM | POA: Diagnosis not present

## 2022-04-19 DIAGNOSIS — M7062 Trochanteric bursitis, left hip: Secondary | ICD-10-CM | POA: Diagnosis not present

## 2022-04-19 DIAGNOSIS — M25552 Pain in left hip: Secondary | ICD-10-CM | POA: Diagnosis not present

## 2022-04-23 DIAGNOSIS — Z8673 Personal history of transient ischemic attack (TIA), and cerebral infarction without residual deficits: Secondary | ICD-10-CM | POA: Diagnosis not present

## 2022-04-23 DIAGNOSIS — F325 Major depressive disorder, single episode, in full remission: Secondary | ICD-10-CM | POA: Diagnosis not present

## 2022-04-23 DIAGNOSIS — Z23 Encounter for immunization: Secondary | ICD-10-CM | POA: Diagnosis not present

## 2022-04-23 DIAGNOSIS — Z0000 Encounter for general adult medical examination without abnormal findings: Secondary | ICD-10-CM | POA: Diagnosis not present

## 2022-04-23 DIAGNOSIS — E559 Vitamin D deficiency, unspecified: Secondary | ICD-10-CM | POA: Diagnosis not present

## 2022-04-23 DIAGNOSIS — R7303 Prediabetes: Secondary | ICD-10-CM | POA: Diagnosis not present

## 2022-04-23 DIAGNOSIS — E7800 Pure hypercholesterolemia, unspecified: Secondary | ICD-10-CM | POA: Diagnosis not present

## 2022-04-23 DIAGNOSIS — M810 Age-related osteoporosis without current pathological fracture: Secondary | ICD-10-CM | POA: Diagnosis not present

## 2022-04-23 DIAGNOSIS — M1990 Unspecified osteoarthritis, unspecified site: Secondary | ICD-10-CM | POA: Diagnosis not present

## 2022-04-23 DIAGNOSIS — H9193 Unspecified hearing loss, bilateral: Secondary | ICD-10-CM | POA: Diagnosis not present

## 2022-04-23 DIAGNOSIS — N39498 Other specified urinary incontinence: Secondary | ICD-10-CM | POA: Diagnosis not present

## 2022-04-23 DIAGNOSIS — I6930 Unspecified sequelae of cerebral infarction: Secondary | ICD-10-CM | POA: Diagnosis not present

## 2022-04-23 DIAGNOSIS — I10 Essential (primary) hypertension: Secondary | ICD-10-CM | POA: Diagnosis not present

## 2022-04-26 DIAGNOSIS — M25551 Pain in right hip: Secondary | ICD-10-CM | POA: Diagnosis not present

## 2022-04-26 DIAGNOSIS — M1612 Unilateral primary osteoarthritis, left hip: Secondary | ICD-10-CM | POA: Diagnosis not present

## 2022-04-26 DIAGNOSIS — G458 Other transient cerebral ischemic attacks and related syndromes: Secondary | ICD-10-CM | POA: Diagnosis not present

## 2022-04-26 DIAGNOSIS — I6389 Other cerebral infarction: Secondary | ICD-10-CM | POA: Diagnosis not present

## 2022-04-26 DIAGNOSIS — R2689 Other abnormalities of gait and mobility: Secondary | ICD-10-CM | POA: Diagnosis not present

## 2022-04-26 DIAGNOSIS — M25552 Pain in left hip: Secondary | ICD-10-CM | POA: Diagnosis not present

## 2022-04-26 DIAGNOSIS — M7062 Trochanteric bursitis, left hip: Secondary | ICD-10-CM | POA: Diagnosis not present

## 2022-05-06 DIAGNOSIS — M7062 Trochanteric bursitis, left hip: Secondary | ICD-10-CM | POA: Diagnosis not present

## 2022-05-06 DIAGNOSIS — R2689 Other abnormalities of gait and mobility: Secondary | ICD-10-CM | POA: Diagnosis not present

## 2022-05-06 DIAGNOSIS — I6389 Other cerebral infarction: Secondary | ICD-10-CM | POA: Diagnosis not present

## 2022-05-06 DIAGNOSIS — M1612 Unilateral primary osteoarthritis, left hip: Secondary | ICD-10-CM | POA: Diagnosis not present

## 2022-05-06 DIAGNOSIS — M25552 Pain in left hip: Secondary | ICD-10-CM | POA: Diagnosis not present

## 2022-05-06 DIAGNOSIS — G458 Other transient cerebral ischemic attacks and related syndromes: Secondary | ICD-10-CM | POA: Diagnosis not present

## 2022-05-06 DIAGNOSIS — M25551 Pain in right hip: Secondary | ICD-10-CM | POA: Diagnosis not present

## 2022-05-28 ENCOUNTER — Emergency Department (HOSPITAL_COMMUNITY): Payer: Medicare Other

## 2022-05-28 ENCOUNTER — Other Ambulatory Visit: Payer: Self-pay

## 2022-05-28 ENCOUNTER — Emergency Department (HOSPITAL_COMMUNITY)
Admission: EM | Admit: 2022-05-28 | Discharge: 2022-05-29 | Disposition: A | Discharge status: Home / Self Care | Payer: Medicare Other | Attending: Emergency Medicine | Admitting: Emergency Medicine

## 2022-05-28 DIAGNOSIS — R531 Weakness: Secondary | ICD-10-CM | POA: Diagnosis not present

## 2022-05-28 DIAGNOSIS — R0689 Other abnormalities of breathing: Secondary | ICD-10-CM | POA: Diagnosis not present

## 2022-05-28 DIAGNOSIS — R55 Syncope and collapse: Secondary | ICD-10-CM | POA: Insufficient documentation

## 2022-05-28 DIAGNOSIS — Z79899 Other long term (current) drug therapy: Secondary | ICD-10-CM | POA: Insufficient documentation

## 2022-05-28 DIAGNOSIS — Z7902 Long term (current) use of antithrombotics/antiplatelets: Secondary | ICD-10-CM | POA: Insufficient documentation

## 2022-05-28 DIAGNOSIS — I959 Hypotension, unspecified: Secondary | ICD-10-CM | POA: Diagnosis not present

## 2022-05-28 DIAGNOSIS — Z853 Personal history of malignant neoplasm of breast: Secondary | ICD-10-CM | POA: Insufficient documentation

## 2022-05-28 DIAGNOSIS — I10 Essential (primary) hypertension: Secondary | ICD-10-CM | POA: Diagnosis not present

## 2022-05-28 DIAGNOSIS — I6381 Other cerebral infarction due to occlusion or stenosis of small artery: Secondary | ICD-10-CM | POA: Diagnosis not present

## 2022-05-28 LAB — CBC
HCT: 39.3 % (ref 36.0–46.0)
Hemoglobin: 13 g/dL (ref 12.0–15.0)
MCH: 30.2 pg (ref 26.0–34.0)
MCHC: 33.1 g/dL (ref 30.0–36.0)
MCV: 91.4 fL (ref 80.0–100.0)
Platelets: 272 10*3/uL (ref 150–400)
RBC: 4.3 MIL/uL (ref 3.87–5.11)
RDW: 13.8 % (ref 11.5–15.5)
WBC: 11.7 10*3/uL — ABNORMAL HIGH (ref 4.0–10.5)
nRBC: 0 % (ref 0.0–0.2)

## 2022-05-28 LAB — BASIC METABOLIC PANEL
Anion gap: 17 — ABNORMAL HIGH (ref 5–15)
BUN: 12 mg/dL (ref 8–23)
CO2: 24 mmol/L (ref 22–32)
Calcium: 9.4 mg/dL (ref 8.9–10.3)
Chloride: 99 mmol/L (ref 98–111)
Creatinine, Ser: 0.89 mg/dL (ref 0.44–1.00)
GFR, Estimated: >60 mL/min (ref >60)
Glucose, Bld: 144 mg/dL — ABNORMAL HIGH (ref 70–99)
Potassium: 3.7 mmol/L (ref 3.5–5.1)
Sodium: 140 mmol/L (ref 135–145)

## 2022-05-28 LAB — TROPONIN I (HIGH SENSITIVITY): Troponin I (High Sensitivity): 3 ng/L (ref <18)

## 2022-05-28 LAB — LACTIC ACID, PLASMA: Lactic Acid, Venous: 2.1 mmol/L (ref 0.5–1.9)

## 2022-05-28 NOTE — ED Provider Notes (Signed)
[facility] EMERGENCY DEPARTMENT Provider Note   CSN: [id number] Arrival date & time: 05/28/22  2130     History  Chief Complaint  Patient presents with   Weakness    Sandra MACIOLEK is a 81 y.o. female.  With a history of hypertension, hyperlipidemia, breast cancer, previous CVA who presents to the ED for evaluation of near syncopal episode.  Patient states that 1 hour prior to arrival she was sitting in her chair watching TV and began to feel very weak.  Husband reports that he tried to stand her up to bring her into the bedroom but she was too weak to stand.  He states that she was nearly unresponsive at that time.  He called the nurse to come check on her and states that initial BP was 70/44 and she was pale and cool to the touch.  EMS was called and advised that she be seen in the ED for further work-up.  Upon my evaluation patient has no complaints and is feeling back to normal.  Patient describes a feeling of the episode as being "off."  Specifically denied preceding chest pain, shortness of breath, dizziness, lightheadedness, numbness, weakness, tingling.  Patient has had a normal diet recently.  No recent illnesses. Patient is on plavix. She was given a 1L NS bolus by EMS prior to arrival.   Weakness      Home Medications Prior to Admission medications   Medication Sig Start Date End Date Taking? Authorizing Provider  acetaminophen (TYLENOL) 325 MG tablet Take 2 tablets (650 mg total) by mouth every 4 (four) hours as needed for mild pain (or temp > 37.5 C (99.5 F)). 07/05/21   Angiulli, Lavon Paganini, PA-C  amLODipine (NORVASC) 5 MG tablet Take 1 tablet (5 mg total) by mouth daily. 07/10/21   Bonnielee Haff, MD  brinzolamide (AZOPT) 1 % ophthalmic suspension Place 1 drop into both eyes 2 (two) times daily.     [provider]  clopidogrel (PLAVIX) 75 MG tablet Plavix 75 mg daily beginning 07/26/2021 07/05/21   Angiulli, Lavon Paganini, PA-C  LORazepam  (ATIVAN) 0.5 MG tablet Take 0.25 mg by mouth every 8 (eight) hours as needed for anxiety.    [provider]  melatonin 3 MG TABS tablet Take 6 mg by mouth at bedtime as needed (sleep).    [provider]  mirtazapine (REMERON) 15 MG tablet Take 15 mg by mouth at bedtime.    [provider]  polyethylene glycol (MIRALAX / GLYCOLAX) 17 g packet Take 17 g by mouth daily as needed for moderate constipation.    [provider]  QUEtiapine (SEROQUEL) 12.5 mg TABS tablet Take 12.5 mg by mouth daily.    [provider]  rosuvastatin (CRESTOR) 10 MG tablet Take 1 tablet (10 mg total) by mouth daily. 07/05/21   Angiulli, Lavon Paganini, PA-C  senna-docusate (SENOKOT-S) 8.6-50 MG tablet Take 1 tablet by mouth at bedtime as needed for mild constipation. 08/28/21   Annita Brod, MD  timolol (TIMOPTIC) 0.5 % ophthalmic solution Place 1 drop into both eyes 2 (two) times daily. 02/12/21   [provider]  valsartan (DIOVAN) 320 MG tablet Take 1 tablet (320 mg total) by mouth daily. 07/05/21   Angiulli, Lavon Paganini, PA-C  Vitamins A & D (VITAMIN A & D) ointment Apply 1 application. topically as needed for dry skin.    [provider]      Allergies    Otho Darner allergy],  Fosamax [alendronate sodium], Hydrochlorothiazide, Losartan, Raloxifene, Serotonin reuptake inhibitors (ssris), and Ace inhibitors    Review of Systems   Review of Systems  Neurological:  Positive for weakness.  All other systems reviewed and are negative.   Physical Exam Updated Vital Signs BP (!) 140/57   Pulse 77   Temp 97.9 F (36.6 C) (Oral)   Resp 14   Ht '5\' 4"'$  (1.626 m)   Wt 71.7 kg   SpO2 98%   BMI 27.12 kg/m  Physical Exam Vitals and nursing note reviewed.  Constitutional:      General: She is not in acute distress.    Appearance: Normal appearance. She is well-developed. She is not ill-appearing, toxic-appearing or diaphoretic.  HENT:     Head:  Normocephalic and atraumatic.     Mouth/Throat:     Mouth: Mucous membranes are moist.     Pharynx: Oropharynx is clear. No oropharyngeal exudate or posterior oropharyngeal erythema.  Eyes:     Extraocular Movements: Extraocular movements intact.     Conjunctiva/sclera: Conjunctivae normal.     Pupils: Pupils are equal, round, and reactive to light.  Cardiovascular:     Rate and Rhythm: Normal rate and regular rhythm.     Pulses: Normal pulses.     Heart sounds: Normal heart sounds. No murmur heard. Pulmonary:     Effort: Pulmonary effort is normal. No respiratory distress.     Breath sounds: Normal breath sounds. No stridor. No wheezing, rhonchi or rales.  Abdominal:     Palpations: Abdomen is soft.     Tenderness: There is no abdominal tenderness.  Musculoskeletal:        General: No swelling.     Cervical back: Normal range of motion and neck supple.  Skin:    General: Skin is warm and dry.     Capillary Refill: Capillary refill takes less than 2 seconds.  Neurological:     General: No focal deficit present.     Mental Status: She is alert and oriented to person, place, and time.     Comments: No facial droop, slurred speech, unilateral or global weakness, pronator drift.  Sensation intact.  Heel-to-shin normal.  Psychiatric:        Mood and Affect: Mood normal.        Behavior: Behavior normal.     ED Results / Procedures / Treatments   Labs (all labs ordered are listed, but only abnormal results are displayed) Labs Reviewed  BASIC METABOLIC PANEL  CBC  URINALYSIS, ROUTINE W REFLEX MICROSCOPIC  TROPONIN I (HIGH SENSITIVITY)    EKG None  Radiology No results found.  Procedures Procedures    Medications Ordered in ED Medications - No data to display  ED Course/ Medical Decision Making/ A&P                           Medical Decision Making Amount and/or Complexity of Data Reviewed Labs: ordered.  This patient presents to the ED for concern of weakness,  this involves an extensive number of treatment options, and is a complaint that carries with it a high risk of complications and morbidity.  The differential diagnosis of weakness includes but is not limited to neurologic causes (GBS, myasthenia gravis, CVA, MS, ALS, transverse myelitis, spinal cord injury, CVA, botulism, ) and other causes: ACS, Arrhythmia, syncope, orthostatic hypotension, sepsis, hypoglycemia, electrolyte disturbance, hypothyroidism, respiratory failure, symptomatic anemia, dehydration, heat injury, polypharmacy, malignancy.    Co  morbidities that complicate the patient evaluation  Hypertension, hyperlipidemia, breast cancer, previous CVA  My initial workup includes basic labs, EKG.  Patient reported receiving 1 L normal saline through IV via EMS  Additional history obtained from: Nursing notes from this visit. Previous records within EMR system ED visit on 02/24/2022 for evaluation of UTI Family husband and son are present and provide personal history EMS provides a personal history  I ordered, reviewed and interpreted labs which include: CBC, BMP, troponin, lactate, urinalysis. CBC significant for slight leukocytosis of 11.7. all other labs unresulted at the time of handoff.   Cardiac Monitoring:  The patient was maintained on a cardiac monitor.  I personally viewed and interpreted the cardiac monitored which showed an underlying rhythm of: NSR   Afebrile, hemodynamically stable. Patient presenting for a brief near syncopal episode. Nursing home nurse reported hypotension of 70/44 initially. Patient was normotensive by the time EMS arrived. She was asymptomatic on my evaluation. She has no cardiac history and I have low suspicion for ACS. She has a history of previous CVA on plavix. Neurological exam unremarkable and I have low suspicion of this as the cause. All labs besides CBC pending at the time of handoff. I believe patient may have had a vagal episode. Current plan is  to monitor labs. If labs are normal, I believe patient will be cleared for discharge with PCP vs cardiology follow up.  Care will be handed off to [employer], PA-C pending workup. Current plan is dependent on lab values, but if normal patient will likely be cleared for discharge with appropriate follow up. This plan is dependent on labs and can be changed at the discretion of the oncoming provider.    Patient's case discussed with Dr. Armandina Gemma.  Note: Portions of this report may have been transcribed using voice recognition software. Every effort was made to ensure accuracy; however, inadvertent computerized transcription errors may still be present.          Final Clinical Impression(s) / ED Diagnoses Final diagnoses:  None    Rx / DC Orders ED Discharge Orders     None         Nehemiah Massed 05/28/22 2338    Regan Lemming, MD 05/29/22 [phone]

## 2022-05-28 NOTE — ED Triage Notes (Signed)
Pt bib [facility] from [facility] home c/o generalized weakness. Pt pale and cool to touch, denies pain/ SOB. BP initially 70/44, 110 sys after moving to EMS stretcher. Hx stroke, axo x4.   BP 140/60 HR 70 RR 14 SPO2 95% RA CBG 119

## 2022-05-29 ENCOUNTER — Emergency Department (HOSPITAL_COMMUNITY): Payer: Medicare Other

## 2022-05-29 DIAGNOSIS — I6381 Other cerebral infarction due to occlusion or stenosis of small artery: Secondary | ICD-10-CM | POA: Diagnosis not present

## 2022-05-29 DIAGNOSIS — R531 Weakness: Secondary | ICD-10-CM | POA: Diagnosis not present

## 2022-05-29 LAB — URINALYSIS, ROUTINE W REFLEX MICROSCOPIC
Bilirubin Urine: NEGATIVE
Glucose, UA: NEGATIVE mg/dL
Hgb urine dipstick: NEGATIVE
Ketones, ur: NEGATIVE mg/dL
Nitrite: NEGATIVE
Protein, ur: NEGATIVE mg/dL
Specific Gravity, Urine: 1.012 (ref 1.005–1.030)
pH: 6 (ref 5.0–8.0)

## 2022-05-29 LAB — TROPONIN I (HIGH SENSITIVITY): Troponin I (High Sensitivity): 3 ng/L (ref <18)

## 2022-05-29 LAB — LACTIC ACID, PLASMA: Lactic Acid, Venous: 1.1 mmol/L (ref 0.5–1.9)

## 2022-05-29 MED ORDER — LACTATED RINGERS IV BOLUS
1000.0000 mL | Freq: Once | INTRAVENOUS | Status: AC
  Administered 2022-05-29: 1000 mL via INTRAVENOUS

## 2022-05-29 NOTE — Discharge Instructions (Addendum)
Your emergent department evaluation was reassuring.  If you have worsening symptoms, return to the ER.  Otherwise, please follow-up with your doctor.

## 2022-05-29 NOTE — ED Notes (Signed)
Patient transported to CT 

## 2022-05-29 NOTE — ED Provider Notes (Signed)
Patient here for evaluation of near syncopal episode.  The episode happened while at home while watching TV.  See prior provider HPI for details.  Patient reassessed by me.  She has ambulated to the bathroom twice.  She denies having any symptoms currently.  Feels well.  Denies any pain.     Labs and imaging are reassuring.    Lactic 2.1->1.1, doubt sepsis or infection.  CXR and UA negative for infection.  CT head negative for new stroke.  No significant electrolyte derangement.  Feel that patient can be safely discharged.  She will follow-up with her doctor.  Return precautions discussed.   Montine Circle, PA-C 05/29/22 0352    Ripley Fraise, MD 05/29/22 [phone]

## 2022-05-29 NOTE — ED Notes (Signed)
[facility] PA made aware of pts critical lactic 2.1

## 2022-05-29 NOTE — ED Notes (Signed)
RN reviewed discharge instructions with pt. Pt verbalized understanding and had no further questions. VSS upon discharge.  

## 2022-06-13 DIAGNOSIS — F325 Major depressive disorder, single episode, in full remission: Secondary | ICD-10-CM | POA: Diagnosis not present

## 2022-06-13 DIAGNOSIS — R1319 Other dysphagia: Secondary | ICD-10-CM | POA: Diagnosis not present

## 2022-06-24 DIAGNOSIS — R1312 Dysphagia, oropharyngeal phase: Secondary | ICD-10-CM | POA: Diagnosis not present

## 2022-06-24 DIAGNOSIS — G458 Other transient cerebral ischemic attacks and related syndromes: Secondary | ICD-10-CM | POA: Diagnosis not present

## 2022-06-24 DIAGNOSIS — I69320 Aphasia following cerebral infarction: Secondary | ICD-10-CM | POA: Diagnosis not present

## 2022-06-24 DIAGNOSIS — I69391 Dysphagia following cerebral infarction: Secondary | ICD-10-CM | POA: Diagnosis not present

## 2022-06-28 DIAGNOSIS — Z23 Encounter for immunization: Secondary | ICD-10-CM | POA: Diagnosis not present

## 2022-07-01 DIAGNOSIS — I69391 Dysphagia following cerebral infarction: Secondary | ICD-10-CM | POA: Diagnosis not present

## 2022-07-01 DIAGNOSIS — G458 Other transient cerebral ischemic attacks and related syndromes: Secondary | ICD-10-CM | POA: Diagnosis not present

## 2022-07-01 DIAGNOSIS — R1312 Dysphagia, oropharyngeal phase: Secondary | ICD-10-CM | POA: Diagnosis not present

## 2022-07-01 DIAGNOSIS — I69320 Aphasia following cerebral infarction: Secondary | ICD-10-CM | POA: Diagnosis not present

## 2022-07-19 ENCOUNTER — Emergency Department (HOSPITAL_BASED_OUTPATIENT_CLINIC_OR_DEPARTMENT_OTHER): Payer: Medicare Other

## 2022-07-19 ENCOUNTER — Encounter (HOSPITAL_BASED_OUTPATIENT_CLINIC_OR_DEPARTMENT_OTHER): Payer: Self-pay | Admitting: Emergency Medicine

## 2022-07-19 ENCOUNTER — Emergency Department (HOSPITAL_BASED_OUTPATIENT_CLINIC_OR_DEPARTMENT_OTHER)
Admission: EM | Admit: 2022-07-19 | Discharge: 2022-07-19 | Disposition: A | Discharge status: Home / Self Care | Payer: Medicare Other | Attending: Emergency Medicine | Admitting: Emergency Medicine

## 2022-07-19 ENCOUNTER — Other Ambulatory Visit: Payer: Self-pay

## 2022-07-19 DIAGNOSIS — Z79899 Other long term (current) drug therapy: Secondary | ICD-10-CM | POA: Insufficient documentation

## 2022-07-19 DIAGNOSIS — R1031 Right lower quadrant pain: Secondary | ICD-10-CM | POA: Diagnosis not present

## 2022-07-19 DIAGNOSIS — K429 Umbilical hernia without obstruction or gangrene: Secondary | ICD-10-CM | POA: Diagnosis not present

## 2022-07-19 DIAGNOSIS — I10 Essential (primary) hypertension: Secondary | ICD-10-CM | POA: Diagnosis not present

## 2022-07-19 DIAGNOSIS — Z7902 Long term (current) use of antithrombotics/antiplatelets: Secondary | ICD-10-CM | POA: Insufficient documentation

## 2022-07-19 DIAGNOSIS — N281 Cyst of kidney, acquired: Secondary | ICD-10-CM | POA: Diagnosis not present

## 2022-07-19 LAB — LIPASE, BLOOD: Lipase: 66 U/L — ABNORMAL HIGH (ref 11–51)

## 2022-07-19 LAB — URINALYSIS, ROUTINE W REFLEX MICROSCOPIC
Bilirubin Urine: NEGATIVE
Glucose, UA: NEGATIVE mg/dL
Hgb urine dipstick: NEGATIVE
Ketones, ur: NEGATIVE mg/dL
Leukocytes,Ua: NEGATIVE
Nitrite: NEGATIVE
Protein, ur: NEGATIVE mg/dL
Specific Gravity, Urine: 1.006 (ref 1.005–1.030)
pH: 6.5 (ref 5.0–8.0)

## 2022-07-19 LAB — CBC
HCT: 44.1 % (ref 36.0–46.0)
Hemoglobin: 14.6 g/dL (ref 12.0–15.0)
MCH: 29.6 pg (ref 26.0–34.0)
MCHC: 33.1 g/dL (ref 30.0–36.0)
MCV: 89.3 fL (ref 80.0–100.0)
Platelets: 333 10*3/uL (ref 150–400)
RBC: 4.94 MIL/uL (ref 3.87–5.11)
RDW: 13.2 % (ref 11.5–15.5)
WBC: 7.5 10*3/uL (ref 4.0–10.5)
nRBC: 0 % (ref 0.0–0.2)

## 2022-07-19 LAB — COMPREHENSIVE METABOLIC PANEL
ALT: 45 U/L — ABNORMAL HIGH (ref 0–44)
AST: 40 U/L (ref 15–41)
Albumin: 4.5 g/dL (ref 3.5–5.0)
Alkaline Phosphatase: 57 U/L (ref 38–126)
Anion gap: 11 (ref 5–15)
BUN: 13 mg/dL (ref 8–23)
CO2: 29 mmol/L (ref 22–32)
Calcium: 9.9 mg/dL (ref 8.9–10.3)
Chloride: 100 mmol/L (ref 98–111)
Creatinine, Ser: 0.7 mg/dL (ref 0.44–1.00)
GFR, Estimated: >60 mL/min (ref >60)
Glucose, Bld: 135 mg/dL — ABNORMAL HIGH (ref 70–99)
Potassium: 3.8 mmol/L (ref 3.5–5.1)
Sodium: 140 mmol/L (ref 135–145)
Total Bilirubin: 0.4 mg/dL (ref 0.3–1.2)
Total Protein: 7.5 g/dL (ref 6.5–8.1)

## 2022-07-19 MED ORDER — IOHEXOL 300 MG/ML  SOLN
100.0000 mL | Freq: Once | INTRAMUSCULAR | Status: AC | PRN
  Administered 2022-07-19: 80 mL via INTRAVENOUS

## 2022-07-19 NOTE — Discharge Instructions (Signed)
You were seen in the emergency department for your abdominal pain.  You had no signs of appendicitis or other infection.  It is unclear what is causing your pain at this time but you can take Tylenol as needed.  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  You should return to the emergency department if you have significantly worsening pain, fevers, repetitive vomiting or any other new or concerning symptoms.

## 2022-07-19 NOTE — ED Provider Notes (Signed)
[facility] Provider Note   CSN: [id number] Arrival date & time: 07/19/22  1430     History  Chief Complaint  Patient presents with   Abdominal Pain    Sandra Becker is a 81 y.o. female.  Patient is an 81 year old female with a past medical history of hypertension and hyperlipidemia presenting to the emergency department with abdominal pain.  Patient states that she woke up this morning with right lower quadrant abdominal pain.  She denies any fevers or chills, nausea or vomiting, diarrhea or constipation, dysuria or hematuria.  She denies any prior abdominal surgeries or history of kidney stones.  She states she has not taken anything for her pain at home and declines any pain medication at this time.  The history is provided by the patient and the spouse.  Abdominal Pain      Home Medications Prior to Admission medications   Medication Sig Start Date End Date Taking? Authorizing Provider  acetaminophen (TYLENOL) 325 MG tablet Take 2 tablets (650 mg total) by mouth every 4 (four) hours as needed for mild pain (or temp > 37.5 C (99.5 F)). 07/05/21   Angiulli, Lavon Paganini, PA-C  amLODipine (NORVASC) 5 MG tablet Take 1 tablet (5 mg total) by mouth daily. 07/10/21   Bonnielee Haff, MD  brinzolamide (AZOPT) 1 % ophthalmic suspension Place 1 drop into both eyes 2 (two) times daily.     [provider]  clopidogrel (PLAVIX) 75 MG tablet Plavix 75 mg daily beginning 07/26/2021 07/05/21   Angiulli, Lavon Paganini, PA-C  LORazepam (ATIVAN) 0.5 MG tablet Take 0.25 mg by mouth every 8 (eight) hours as needed for anxiety.    [provider]  melatonin 3 MG TABS tablet Take 6 mg by mouth at bedtime as needed (sleep).    [provider]  mirtazapine (REMERON) 15 MG tablet Take 15 mg by mouth at bedtime.    [provider]  polyethylene glycol (MIRALAX / GLYCOLAX) 17 g packet Take 17 g by mouth daily as needed for moderate constipation.     [provider]  QUEtiapine (SEROQUEL) 12.5 mg TABS tablet Take 12.5 mg by mouth daily.    [provider]  rosuvastatin (CRESTOR) 10 MG tablet Take 1 tablet (10 mg total) by mouth daily. 07/05/21   Angiulli, Lavon Paganini, PA-C  senna-docusate (SENOKOT-S) 8.6-50 MG tablet Take 1 tablet by mouth at bedtime as needed for mild constipation. 08/28/21   Annita Brod, MD  timolol (TIMOPTIC) 0.5 % ophthalmic solution Place 1 drop into both eyes 2 (two) times daily. 02/12/21   [provider]  valsartan (DIOVAN) 320 MG tablet Take 1 tablet (320 mg total) by mouth daily. 07/05/21   Angiulli, Lavon Paganini, PA-C  Vitamins A & D (VITAMIN A & D) ointment Apply 1 application. topically as needed for dry skin.    [provider]      Allergies    Otho Darner allergy], Fosamax [alendronate sodium], Hydrochlorothiazide, Losartan, Raloxifene, Serotonin reuptake inhibitors (ssris), and Ace inhibitors    Review of Systems   Review of Systems  Gastrointestinal:  Positive for abdominal pain.    Physical Exam Updated Vital Signs BP (!) 152/67   Pulse 85   Temp 97.7 F (36.5 C) (Oral)   Resp 16   Ht '5\' 4"'$  (1.626 m)   Wt 67.1 kg   SpO2 99%   BMI 25.40 kg/m  Physical Exam Vitals and nursing note reviewed.  Constitutional:  General: She is not in acute distress.    Appearance: She is well-developed.  HENT:     Head: Normocephalic and atraumatic.     Mouth/Throat:     Mouth: Mucous membranes are moist.     Pharynx: Oropharynx is clear.  Eyes:     Extraocular Movements: Extraocular movements intact.  Cardiovascular:     Rate and Rhythm: Normal rate and regular rhythm.     Heart sounds: Normal heart sounds.  Pulmonary:     Effort: Pulmonary effort is normal.     Breath sounds: Normal breath sounds.  Abdominal:     General: Abdomen is flat.     Palpations: Abdomen is soft.     Tenderness: There is abdominal tenderness in the right lower quadrant. There is  no right CVA tenderness, left CVA tenderness, guarding or rebound.  Skin:    General: Skin is warm and dry.  Neurological:     General: No focal deficit present.     Mental Status: She is alert and oriented to person, place, and time.     ED Results / Procedures / Treatments   Labs (all labs ordered are listed, but only abnormal results are displayed) Labs Reviewed  LIPASE, BLOOD - Abnormal; Notable for the following components:      Result Value   Lipase 66 (*)    All other components within normal limits  COMPREHENSIVE METABOLIC PANEL - Abnormal; Notable for the following components:   Glucose, Bld 135 (*)    ALT 45 (*)    All other components within normal limits  URINALYSIS, ROUTINE W REFLEX MICROSCOPIC - Abnormal; Notable for the following components:   Color, Urine COLORLESS (*)    All other components within normal limits  CBC    EKG None  Radiology No results found.  Procedures Procedures    Medications Ordered in ED Medications  iohexol (OMNIPAQUE) 300 MG/ML solution 100 mL (80 mLs Intravenous Contrast Given 07/19/22 1717)    ED Course/ Medical Decision Making/ A&P Clinical Course as of 07/19/22 1813  Fri Jul 19, 2022  1809 CT without acute explanation of patient's pain, her pain is well controlled and she is stable for discharge. [VK]    Clinical Course User Index [VK] Kemper Durie, DO                           Medical Decision Making This patient presents to the ED with chief complaint(s) of abdominal pain with pertinent past medical history of HTN, HLD which further complicates the presenting complaint. The complaint involves an extensive differential diagnosis and also carries with it a high risk of complications and morbidity.    The differential diagnosis includes appendicitis, nephrolithiasis, UTI, musculoskeletal pain  Additional history obtained: Additional history obtained from spouse Records reviewed N/A  ED Course and  Reassessment: Patient was initially evaluated by provider in triage and had labs and urine performed that showed a mild elevation of her lipase and is otherwise within normal range.  Urine was negative.  Patient will have CT abdomen and pelvis to evaluate for appendicitis or other intra-abdominal cause of her pain.  She declined any pain medication at this time.  Independent labs interpretation:  The following labs were independently interpreted: Within normal range  Independent visualization of imaging: - I independently visualized the following imaging with scope of interpretation limited to determining acute life threatening conditions related to emergency care: CT AP, which revealed  no acute disease  Consultation: - Consulted or discussed management/test interpretation w/ external professional: N/A  Consideration for admission or further workup: Patient has no emergent conditions requiring admission or further work-up at this time and is stable for discharge home with primary care follow-up  Social Determinants of health: N/A    Amount and/or Complexity of Data Reviewed Labs: ordered. Radiology: ordered.  Risk Prescription drug management.          Final Clinical Impression(s) / ED Diagnoses Final diagnoses:  Right lower quadrant abdominal pain  Renal cyst    Rx / DC Orders ED Discharge Orders     None         Kemper Durie, DO 07/19/22 1813

## 2022-07-19 NOTE — ED Triage Notes (Signed)
Pt arrives to ED with c/o RLQ abdominal pain that started this afternoon.

## 2022-07-19 NOTE — ED Provider Triage Note (Signed)
Emergency Medicine Provider Triage Evaluation Note  Sandra Becker , a 81 y.o. female  was evaluated in triage.  Pt complains of right sided abdominal pain that started about 5-6 hours ago.  Pain non-radiating, achy, and constant.  No hx of abdominal surgeries.  Denies fever, chills, changes in urinary/bowel habits, flank pain, or N/V.  Review of Systems  Positive:  Negative: See above  Physical Exam  There were no vitals taken for this visit. Gen:   Awake, no distress   Resp:  Normal effort  MSK:   Moves extremities without difficulty  Other:  Abdomen soft, mild RLQ/RUQ tenderness.  Sitting comfortably.  Medical Decision Making  Medically screening exam initiated at 2:54 PM.  Appropriate orders placed.  LOVENIA DEBRULER was informed that the remainder of the evaluation will be completed by another provider, this initial triage assessment does not replace that evaluation, and the importance of remaining in the ED until their evaluation is complete.     Prince Rome, PA-C 27/78/24 1456

## 2022-07-30 DIAGNOSIS — M25552 Pain in left hip: Secondary | ICD-10-CM | POA: Diagnosis not present

## 2022-09-02 DIAGNOSIS — R1031 Right lower quadrant pain: Secondary | ICD-10-CM | POA: Diagnosis not present

## 2022-09-02 DIAGNOSIS — R748 Abnormal levels of other serum enzymes: Secondary | ICD-10-CM | POA: Diagnosis not present

## 2022-09-02 DIAGNOSIS — I700 Atherosclerosis of aorta: Secondary | ICD-10-CM | POA: Diagnosis not present

## 2022-09-02 DIAGNOSIS — N281 Cyst of kidney, acquired: Secondary | ICD-10-CM | POA: Diagnosis not present

## 2022-09-04 ENCOUNTER — Other Ambulatory Visit: Payer: Self-pay | Admitting: Internal Medicine

## 2022-09-04 DIAGNOSIS — N281 Cyst of kidney, acquired: Secondary | ICD-10-CM

## 2022-09-19 ENCOUNTER — Ambulatory Visit (INDEPENDENT_AMBULATORY_CARE_PROVIDER_SITE_OTHER): Payer: Medicare Other | Admitting: Neurology

## 2022-09-19 ENCOUNTER — Encounter: Payer: Self-pay | Admitting: Neurology

## 2022-09-19 VITALS — BP 138/67 | HR 86 | Ht 64.0 in | Wt 152.0 lb

## 2022-09-19 DIAGNOSIS — I639 Cerebral infarction, unspecified: Secondary | ICD-10-CM

## 2022-09-19 DIAGNOSIS — G3184 Mild cognitive impairment, so stated: Secondary | ICD-10-CM

## 2022-09-19 MED ORDER — DONEPEZIL HCL 5 MG PO TABS: 5.0000 mg | ORAL_TABLET | Freq: Every day | ORAL | 11 refills | Status: DC

## 2022-09-19 NOTE — Patient Instructions (Addendum)
Continue current medications Start Aricept 5 mg nightly, side effect of the medication including diarrhea, vivid dreams, and dizziness discussed with patient Follow-up in 1 year or sooner if worse.   There are well-accepted and sensible ways to reduce risk for Alzheimers disease and other degenerative brain disorders .  Exercise Daily Walk A daily 20 minute walk should be part of your routine. Disease related apathy can be a significant roadblock to exercise and the only way to overcome this is to make it a daily routine and perhaps have a reward at the end (something your loved one loves to eat or drink perhaps) or a personal trainer coming to the home can also be very useful. Most importantly, the patient is much more likely to exercise if the caregiver / spouse does it with him/her. In general a structured, repetitive schedule is best.  General Health: Any diseases which effect your body will effect your brain such as a pneumonia, urinary infection, blood clot, heart attack or stroke. Keep contact with your primary care doctor for regular follow ups.  Sleep. A good nights sleep is healthy for the brain. Seven hours is recommended. If you have insomnia or poor sleep habits we can give you some instructions. If you have sleep apnea wear your mask.  Diet: Eating a heart healthy diet is also a good idea; fish and poultry instead of red meat, nuts (mostly non-peanuts), vegetables, fruits, olive oil or canola oil (instead of butter), minimal salt (use other spices to flavor foods), whole grain rice, bread, cereal and pasta and wine in moderation.Research is now showing that the MIND diet, which is a combination of The Mediterranean diet and the DASH diet, is beneficial for cognitive processing and longevity. Information about this diet can be found in The MIND Diet, a book by Doyne Keel, MS, RDN, and online at [employer]  Finances, Power of Attorney and Advance  Directives: You should consider putting legal safeguards in place with regard to financial and medical decision making. While the spouse always has power of attorney for medical and financial issues in the absence of any form, you should consider what you want in case the spouse / caregiver is no longer around or capable of making decisions.

## 2022-09-19 NOTE — Progress Notes (Signed)
[facility]  PATIENT: Sandra Becker DOB: 12/17/1940  REQUESTING CLINICIAN: Kelton Pillar, MD HISTORY FROM: Patient but mostly husband  REASON FOR VISIT: Hospital follow up/stroke    HISTORICAL  CHIEF COMPLAINT:  Chief Complaint  Patient presents with   Follow-up    Rm 13 with spouse [name] Pt is well and stable, no new stroke concerns since last visit     INTERVAL HISTORY 09/19/2022 Patient presents today for follow-up, she is accompanied by her husband.  Since last visit a year ago, she reported she is doing much better.  She said a few months after our initial visit she was admitted to [facility] in their memory unit.  At that time she was felt to have worsening dementia because she attempted to leave the nursing home one night.  But while being in the memory unit, she continued to improve, her symptoms got better and she was discharged.  Currently she is at home with her husband [name].  She is not doing any cooking, but husband stated that she does help with cleaning.  She does read several books a week.  She does not need any help with self care. She does not drive, she is no longer doing their taxes.  She does have word finding difficulty but again there is an improvement in her memory and she continues to improve every day.   HISTORY OF PRESENT ILLNESS:  This is a 82 year old woman with past medical history of hypertension, hyperlipidemia, TIA who was admitted in the hospital in November for acute left basal ganglia stroke.  Patient initially presented with slurred speech and right-sided weakness.  MRI of her brain showed a left basal ganglia infarct, etiology likely small vessel disease but cannot rule out cardioembolic source.  Echocardiogram was done showing EF 60 to 65%, no documented A-fib.  Her EEG was normal no seizures.  Her stroke labs show LDL of 69 and hemoglobin A1c of 6.0.  She was recommended to start aspirin and Brilinta for  30 days then to continue with Plavix thereafter.  She was also recommended for a cardiac monitor, no evidence of atrial fibrillation.  After the initial hospitalization she was sent to rehab.   She presented to the ED a couple weeks later after slumping over, MRI did not show any acute stroke but show extension of her previous basal ganglia, corona radiata stroke.  Her episode was felt to be recrudescence of stroke symptoms. In January 26, she presented to the hospital for generalized weakness found to be hyponatremic to 116.  Husband said that they felt the hyponatremia was secondary to a her antidepressant Celexa which was switched to mirtazapine.  She was also noted to have a UTI which was treated with antibiotics.  Husband noted that patient now stays at [facility], she is doing physical therapy..  There is still some issue with confusion and abnormal behavior.  She left the room 1 night and start wandering, patient does not remember the event.  Husband said the patient get fixed on matter and wont let them go.  She has a propensity to go to the bathroom multiple times during the day even when she does not need to use it and there is also short-term confusion and some worsening in memory.  Per patient she thinks she is doing all right he does not have any pain currently has no complaint and feels that her speech is fair.   OTHER MEDICAL CONDITIONS: Hypertension, hyperlipidemia  REVIEW OF SYSTEMS: Full 14 system review of systems performed and negative with exception of: as noted in the HPI  ALLERGIES: Allergies  Allergen Reactions   Crab [Shellfish Allergy] Other (See Comments)    From skin test   Fosamax [Alendronate Sodium] Other (See Comments)    Difficult swallowing   Hydrochlorothiazide Other (See Comments)    hyponatremia   Losartan Cough   Raloxifene Other (See Comments)    Unknown reaction   Serotonin Reuptake Inhibitors (Ssris) Other (See Comments)    Hyponatremia    Ace Inhibitors Cough    Reaction to losartan (pt tolerates valsartan)    HOME MEDICATIONS: Outpatient Medications Prior to Visit  Medication Sig Dispense Refill   acetaminophen (TYLENOL) 325 MG tablet Take 2 tablets (650 mg total) by mouth every 4 (four) hours as needed for mild pain (or temp > 37.5 C (99.5 F)).     amLODipine (NORVASC) 5 MG tablet Take 1 tablet (5 mg total) by mouth daily. 30 tablet 1   brinzolamide (AZOPT) 1 % ophthalmic suspension Place 1 drop into both eyes 2 (two) times daily.      clopidogrel (PLAVIX) 75 MG tablet Plavix 75 mg daily beginning 07/26/2021 30 tablet 1   melatonin 3 MG TABS tablet Take 6 mg by mouth at bedtime as needed (sleep).     mirtazapine (REMERON) 15 MG tablet Take 15 mg by mouth at bedtime.     rosuvastatin (CRESTOR) 10 MG tablet Take 1 tablet (10 mg total) by mouth daily. 30 tablet 0   senna-docusate (SENOKOT-S) 8.6-50 MG tablet Take 1 tablet by mouth at bedtime as needed for mild constipation. 10 tablet 1   timolol (TIMOPTIC) 0.5 % ophthalmic solution Place 1 drop into both eyes 2 (two) times daily.     valsartan (DIOVAN) 320 MG tablet Take 1 tablet (320 mg total) by mouth daily. 30 tablet 0   Vitamins A & D (VITAMIN A & D) ointment Apply 1 application. topically as needed for dry skin.     LORazepam (ATIVAN) 0.5 MG tablet Take 0.25 mg by mouth every 8 (eight) hours as needed for anxiety. (Patient not taking: Reported on 09/19/2022)     polyethylene glycol (MIRALAX / GLYCOLAX) 17 g packet Take 17 g by mouth daily as needed for moderate constipation. (Patient not taking: Reported on 09/19/2022)     QUEtiapine (SEROQUEL) 12.5 mg TABS tablet Take 12.5 mg by mouth daily. (Patient not taking: Reported on 09/19/2022)     No facility-administered medications prior to visit.    PAST MEDICAL HISTORY: Past Medical History:  Diagnosis Date   Breast cancer ([place]) 1993   bilateral mastectomy; no chemo or radiation   Glaucoma    Hyperlipidemia     Hypertension     PAST SURGICAL HISTORY: Past Surgical History:  Procedure Laterality Date   MASTECTOMY Bilateral 1993   TONSILLECTOMY AND ADENOIDECTOMY  1947   TUBAL LIGATION  1974    FAMILY HISTORY: Family History  Problem Relation Age of Onset   Breast cancer Mother    Colon cancer Neg Hx     SOCIAL HISTORY: Social History   Socioeconomic History   Marital status: Married    Spouse name: Not on file   Number of children: 2   Years of education: Not on file   Highest education level: Not on file  Occupational History   Not on file  Tobacco Use   Smoking status: Former    Packs/day: 1.00  Years: 5.00    Total pack years: 5.00    Types: Cigarettes    Quit date: 07/30/1963    Years since quitting: 59.1   Smokeless tobacco: Never  Vaping Use   Vaping Use: Never used  Substance and Sexual Activity   Alcohol use: Not Currently    Alcohol/week: 21.0 standard drinks of alcohol    Types: 21 Glasses of wine per week    Comment: occ   Drug use: No   Sexual activity: Not Currently  Other Topics Concern   Not on file  Social History Narrative   Not on file   Social Determinants of Health   Financial Resource Strain: Not on file  Food Insecurity: Not on file  Transportation Needs: Not on file  Physical Activity: Not on file  Stress: Not on file  Social Connections: Not on file  Intimate Partner Violence: Not on file    PHYSICAL EXAM  GENERAL EXAM/CONSTITUTIONAL: Vitals:  Vitals:   09/19/22 1013 09/19/22 1015  BP: (!) 155/67 138/67  Pulse: 86 86  Weight: 152 lb (68.9 kg)   Height: 5' 4"$  (1.626 m)    Body mass index is 26.09 kg/m. Wt Readings from Last 3 Encounters:  09/19/22 152 lb (68.9 kg)  07/19/22 148 lb (67.1 kg)  05/28/22 158 lb (71.7 kg)   Patient is in no distress; well developed, nourished and groomed; neck is supple  EYES: Visual fields full to confrontation, Extraocular movements intacts,   MUSCULOSKELETAL: Gait, strength, tone,  movements noted in Neurologic exam below  NEUROLOGIC: MENTAL STATUS:      No data to display            09/19/2022   10:37 AM  Montreal Cognitive Assessment   Visuospatial/ Executive (0/5) 4  Naming (0/3) 2  Attention: Read list of digits (0/2) 2  Attention: Read list of letters (0/1) 0  Attention: Serial 7 subtraction starting at 100 (0/3) 1  Language: Repeat phrase (0/2) 2  Language : Fluency (0/1) 0  Abstraction (0/2) 2  Delayed Recall (0/5) 5  Orientation (0/6) 5  Total 23  Adjusted Score (based on education) 23    CRANIAL NERVE:  2nd, 3rd, 4th, 6th - pupils equal and reactive to light, visual fields full to confrontation, extraocular muscles intact, no nystagmus 5th - facial sensation symmetric 7th - facial strength symmetric 8th - hearing intact 9th - palate elevates symmetrically, uvula midline 11th - shoulder shrug symmetric 12th - tongue protrusion midline  MOTOR:  normal bulk and tone, full strength in the BUE, BLE    SENSORY:  normal and symmetric to light touch   COORDINATION:  finger-nose-finger, fine finger movements normal  REFLEXES:  deep tendon reflexes present and symmetric  GAIT/STATION:  Ambulate with a cane.    DIAGNOSTIC DATA (LABS, IMAGING, TESTING) - I reviewed patient records, labs, notes, testing and imaging myself where available.  Lab Results  Component Value Date   WBC 7.5 07/19/2022   HGB 14.6 07/19/2022   HCT 44.1 07/19/2022   MCV 89.3 07/19/2022   PLT 333 07/19/2022      Component Value Date/Time   NA 140 07/19/2022 1454   NA 137 12/07/2021 0000   K 3.8 07/19/2022 1454   CL 100 07/19/2022 1454   CO2 29 07/19/2022 1454   GLUCOSE 135 (H) 07/19/2022 1454   BUN 13 07/19/2022 1454   BUN 13 12/07/2021 0000   CREATININE 0.70 07/19/2022 1454   CALCIUM 9.9 07/19/2022 1454  PROT 7.5 07/19/2022 1454   ALBUMIN 4.5 07/19/2022 1454   AST 40 07/19/2022 1454   ALT 45 (H) 07/19/2022 1454   ALKPHOS 57 07/19/2022 1454    BILITOT 0.4 07/19/2022 1454   GFRNONAA >60 07/19/2022 1454   GFRAA >60 09/25/2018 1429   Lab Results  Component Value Date   CHOL 120 07/09/2021   HDL 65 07/09/2021   LDLCALC 39 07/09/2021   TRIG 79 07/09/2021   CHOLHDL 1.8 07/09/2021   Lab Results  Component Value Date   HGBA1C 5.7 (H) 07/09/2021   No results found for: "VITAMINB12" Lab Results  Component Value Date   TSH 1.117 06/25/2021    MRI Brain without contrast  1. Interval expansion of previously identified left basal ganglia/corona radiata infarct, now measuring up to 3.6 cm. No associated hemorrhage or significant regional mass effect. 2. No other acute intracranial abnormality. 3. Underlying age-related cerebral atrophy with moderate chronic microvascular ischemic disease.   CTA Head and Neck 07/08/2022 1. Negative for intracranial large vessel occlusion 2. 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 3. Minimal atherosclerotic disease in the carotid arteries. No carotid or vertebral artery stenosis in the neck.    ASSESSMENT AND PLAN  82 y.o. year old female with vascular risk factor including hypertension, hyperlipidemia, who is presenting for follow up for left basal ganglia stroke and memory decline.  In terms of the stroke, she has regained function of her weakness.  Will continue her Crestor, Norvasc and Plavix. In terms of her memory complaints, again patient continues to improve every day.  She does have some mild cognitive impairment as reflected on the MoCA of 23.  Plan for now is to start her on low-dose Aricept 5 mg nightly and will continue to follow-up.  Side effect of the medication including diarrhea, dizziness and vivid dreams discussed with patient and husband.  Discussed ways to reduce the risk of dementia including keeping a good health, good diet, reports good sleeping pattern and exercise.  Both patient and  husband voiced understanding. I will see them in a year for follow up or sooner if worse.    1. Infarction of left basal ganglia (HCC)   2. Mild cognitive impairment       Patient Instructions  Continue current medications Start Aricept 5 mg nightly, side effect of the medication including diarrhea, vivid dreams, and dizziness discussed with patient Follow-up in 1 year or sooner if worse.   There are well-accepted and sensible ways to reduce risk for Alzheimers disease and other degenerative brain disorders .  Exercise Daily Walk A daily 20 minute walk should be part of your routine. Disease related apathy can be a significant roadblock to exercise and the only way to overcome this is to make it a daily routine and perhaps have a reward at the end (something your loved one loves to eat or drink perhaps) or a personal trainer coming to the home can also be very useful. Most importantly, the patient is much more likely to exercise if the caregiver / spouse does it with him/her. In general a structured, repetitive schedule is best.  General Health: Any diseases which effect your body will effect your brain such as a pneumonia, urinary infection, blood clot, heart attack or stroke. Keep contact with your primary care doctor for regular follow ups.  Sleep. A good nights sleep is healthy for the brain. Seven hours is recommended. If you have insomnia or  poor sleep habits we can give you some instructions. If you have sleep apnea wear your mask.  Diet: Eating a heart healthy diet is also a good idea; fish and poultry instead of red meat, nuts (mostly non-peanuts), vegetables, fruits, olive oil or canola oil (instead of butter), minimal salt (use other spices to flavor foods), whole grain rice, bread, cereal and pasta and wine in moderation.Research is now showing that the MIND diet, which is a combination of The Mediterranean diet and the DASH diet, is beneficial for cognitive processing and  longevity. Information about this diet can be found in The MIND Diet, a book by Doyne Keel, MS, RDN, and online at [employer]book[employer].si  Finances, Power of Attorney and Advance Directives: You should consider putting legal safeguards in place with regard to financial and medical decision making. While the spouse always has power of attorney for medical and financial issues in the absence of any form, you should consider what you want in case the spouse / caregiver is no longer around or capable of making decisions.   No orders of the defined types were placed in this encounter.   Meds ordered this encounter  Medications   donepezil (ARICEPT) 5 MG tablet    Sig: Take 1 tablet (5 mg total) by mouth at bedtime.    Dispense:  30 tablet    Refill:  11    Return in about 1 year (around 09/20/2023).  I have spent a total of 45 minutes dedicated to this patient today, preparing to see patient, performing a medically appropriate examination and evaluation, ordering tests and/or medications and procedures, and counseling and educating the patient/family/caregiver; independently interpreting result and communicating results to the family/patient/caregiver; and documenting clinical information in the electronic medical record.   Alric Ran, MD 09/19/2022, 11:59 AM  [facility] [address], [place], [place] [address] [phone]

## 2022-09-21 ENCOUNTER — Ambulatory Visit (HOSPITAL_BASED_OUTPATIENT_CLINIC_OR_DEPARTMENT_OTHER)
Admission: EM | Admit: 2022-09-21 | Discharge: 2022-09-21 | Disposition: A | Discharge status: Home / Self Care | Payer: Medicare Other | Attending: Internal Medicine | Admitting: Internal Medicine

## 2022-09-21 ENCOUNTER — Emergency Department (HOSPITAL_BASED_OUTPATIENT_CLINIC_OR_DEPARTMENT_OTHER)
Admission: EM | Admit: 2022-09-21 | Discharge: 2022-09-21 | Discharge status: Absent without leave | Payer: Medicare Other

## 2022-09-21 DIAGNOSIS — N281 Cyst of kidney, acquired: Secondary | ICD-10-CM

## 2022-09-21 DIAGNOSIS — N2889 Other specified disorders of kidney and ureter: Secondary | ICD-10-CM | POA: Diagnosis not present

## 2022-09-21 DIAGNOSIS — I700 Atherosclerosis of aorta: Secondary | ICD-10-CM | POA: Diagnosis not present

## 2022-09-21 MED ORDER — IOHEXOL 350 MG/ML SOLN
75.0000 mL | Freq: Once | INTRAVENOUS | Status: AC | PRN
  Administered 2022-09-21: 75 mL via INTRAVENOUS

## 2022-10-01 DIAGNOSIS — H401231 Low-tension glaucoma, bilateral, mild stage: Secondary | ICD-10-CM | POA: Diagnosis not present

## 2022-10-01 DIAGNOSIS — H18513 Endothelial corneal dystrophy, bilateral: Secondary | ICD-10-CM | POA: Diagnosis not present

## 2022-10-02 ENCOUNTER — Other Ambulatory Visit: Payer: Medicare Other

## 2022-10-17 DIAGNOSIS — M810 Age-related osteoporosis without current pathological fracture: Secondary | ICD-10-CM | POA: Diagnosis not present

## 2022-10-17 DIAGNOSIS — G3184 Mild cognitive impairment, so stated: Secondary | ICD-10-CM | POA: Diagnosis not present

## 2022-10-17 DIAGNOSIS — N281 Cyst of kidney, acquired: Secondary | ICD-10-CM | POA: Diagnosis not present

## 2022-10-17 DIAGNOSIS — E7800 Pure hypercholesterolemia, unspecified: Secondary | ICD-10-CM | POA: Diagnosis not present

## 2022-10-17 DIAGNOSIS — I10 Essential (primary) hypertension: Secondary | ICD-10-CM | POA: Diagnosis not present

## 2022-10-17 DIAGNOSIS — R32 Unspecified urinary incontinence: Secondary | ICD-10-CM | POA: Diagnosis not present

## 2022-10-17 DIAGNOSIS — M1612 Unilateral primary osteoarthritis, left hip: Secondary | ICD-10-CM | POA: Diagnosis not present

## 2022-10-17 DIAGNOSIS — I6930 Unspecified sequelae of cerebral infarction: Secondary | ICD-10-CM | POA: Diagnosis not present

## 2022-10-17 DIAGNOSIS — I700 Atherosclerosis of aorta: Secondary | ICD-10-CM | POA: Diagnosis not present

## 2022-10-17 DIAGNOSIS — F325 Major depressive disorder, single episode, in full remission: Secondary | ICD-10-CM | POA: Diagnosis not present

## 2022-11-14 DIAGNOSIS — M1612 Unilateral primary osteoarthritis, left hip: Secondary | ICD-10-CM | POA: Diagnosis not present

## 2022-11-21 DIAGNOSIS — Z23 Encounter for immunization: Secondary | ICD-10-CM | POA: Diagnosis not present

## 2022-11-26 ENCOUNTER — Encounter: Payer: Self-pay | Admitting: Neurology

## 2022-12-02 ENCOUNTER — Ambulatory Visit: Payer: Medicare Other | Admitting: Cardiology

## 2022-12-02 ENCOUNTER — Encounter: Payer: Self-pay | Admitting: Cardiology

## 2022-12-02 VITALS — BP 142/65 | HR 75 | Resp 16 | Ht 64.0 in | Wt 150.0 lb

## 2022-12-02 DIAGNOSIS — Z01818 Encounter for other preprocedural examination: Secondary | ICD-10-CM | POA: Diagnosis not present

## 2022-12-02 DIAGNOSIS — I351 Nonrheumatic aortic (valve) insufficiency: Secondary | ICD-10-CM | POA: Diagnosis not present

## 2022-12-02 NOTE — Progress Notes (Signed)
Patient referred by Ollen Bowl, MD for stroke  Subjective:   Sandra Becker, female    DOB: 04-01-1941, 82 y.o.   MRN: [id number]   Chief Complaint  Patient presents with   Medical Clearance    Left Hip replacement      HPI  82 y.o. Caucasian female with hypertension, hyperlipidemia, prediabetes, glaucoma, breast cancer s/p b/l mastectomy, with stroke (06/2021).  Patient is here with her husband today.  She has made remarkable recovery from her stroke.  She has no significant neurodeficit.  She is able to get around with left hip pain, no chest pain, shortness of breath symptoms.  Sometime back, she had an episode of lightheadedness that was attributed to dehydration.  She did not require hospitalization.  Discharge summary 07/10/2021: Recently admitted on 11/27 for acute onset slurred speech and right-sided weakness.  MRI revealed left basal ganglia infarct.  Felt to be due to small vessel disease versus cardioembolic source.  Recent echo showing EF 60 to 65%, grade 1 diastolic dysfunction, moderate mitral annular calcification, mild to moderate aortic valve sclerosis/calcification.  Recent labs showing A1c 5.7, LDL 75.  Patient was started on aspirin 81 mg daily and Brilinta 90 mg twice daily x30 days, then continue Plavix alone starting 07/25/2021.  Neurology had recommended 30-day cardiac monitor as an outpatient to rule out A. fib.  Patient was discharged to [facility] on 12/1 and returned to her independent living facility on 12/10.  She returned to the ED on 12/11 due to abrupt worsening of her aphasia and right-sided weakness.  Patient was seen by neurology and was hospitalized.   Hospital course: Right-sided weakness with aphasia in the setting of recent stroke MRI did not reveal any new infarct did show expansion of the previously seen left basal ganglia infarct.  Patient had stroke work-up recently including an echocardiogram which showed normal systolic function with grade  1 diastolic dysfunction.   Reason for presentation is not entirely clear.  Does not appear that she had a new stroke.  Presyncope could have been responsible. Orthostatic vital signs were unremarkable. LDL was 75 during previous admission, now it is 39.  HbA1c 5.7.   Urine drug screen unremarkable.   Patient was on aspirin and Brilinta to be taken till 12/27 following which patient to start taking Plavix. Patient is on Crestor. PT OT SLP evaluation EEG did not show any epileptiform activity.   Discussed with Dr. Roda Shutters with neurology.  Okay for discharge home today.  Seen by PT and OT and outpatient therapy is recommended.   Hyponatremia This appears to be chronic.  Stable for the most part.  Chlorthalidone was recently discontinued.     Essential hypertension Blood pressure is noted to be poorly controlled.  Continue with ARB.  Amlodipine added.  Will need outpatient follow-up for same.     History of glaucoma Continue with eyedrops.   Prediabetes HbA1c 5.7  Patient is here today with her husband. She is in a wheelchair. She requires assistance with standing up and walking. She is participating in neuro rehab. She is struggling with memory issues, sleep and emotional stress. She has not undergone any monitor testing for Afib.    Current Outpatient Medications:    acetaminophen (TYLENOL) 325 MG tablet, Take 2 tablets (650 mg total) by mouth every 4 (four) hours as needed for mild pain (or temp > 37.5 C (99.5 F))., Disp: , Rfl:    amLODipine (NORVASC) 5 MG tablet, Take 1  tablet (5 mg total) by mouth daily., Disp: 30 tablet, Rfl: 1   brinzolamide (AZOPT) 1 % ophthalmic suspension, Place 1 drop into both eyes 2 (two) times daily. , Disp: , Rfl:    clopidogrel (PLAVIX) 75 MG tablet, Plavix 75 mg daily beginning 07/26/2021, Disp: 30 tablet, Rfl: 1   donepezil (ARICEPT) 5 MG tablet, Take 1 tablet (5 mg total) by mouth at bedtime., Disp: 30 tablet, Rfl: 11   melatonin 3 MG TABS tablet, Take 6 mg  by mouth at bedtime as needed (sleep)., Disp: , Rfl:    mirtazapine (REMERON) 15 MG tablet, Take 15 mg by mouth at bedtime., Disp: , Rfl:    MYRBETRIQ 25 MG TB24 tablet, Take 25 mg by mouth daily., Disp: , Rfl:    rosuvastatin (CRESTOR) 10 MG tablet, Take 1 tablet (10 mg total) by mouth daily., Disp: 30 tablet, Rfl: 0   timolol (TIMOPTIC) 0.5 % ophthalmic solution, Place 1 drop into both eyes 2 (two) times daily., Disp: , Rfl:    valsartan (DIOVAN) 320 MG tablet, Take 1 tablet (320 mg total) by mouth daily., Disp: 30 tablet, Rfl: 0   Vitamins A & D (VITAMIN A & D) ointment, Apply 1 application. topically as needed for dry skin., Disp: , Rfl:   Cardiovascular and other pertinent studies:  EKG 12/02/2022: Sinus rhythm 68 bpm with sinus arrhtymia Nonspecific T wave abnormality Cannot exclude old inferior infarct  Mobile cardiac telemetry 7 days 08/10/2021 - 08/18/2021: Dominant rhythm: Sinus. HR 58-118 bpm. Avg HR 79 bpm, in sinus rhythm. 25 episodes of atrial tachycardia, fastest at 156 bpm for 10 beats, longest for 13 beats at 116 bpm. 1.3% isolated SVE, <1% couplet/triplets. 0 episodes of VT. <1% isolated VE,  Couplets. No atrial fibrillation/atrial flutter/VT/high grade AV block, sinus pause >3sec noted. 0 patient triggered events.    Echocardiogram 07/09/2021:  1. Left ventricular ejection fraction, by estimation, is 60 to 65%. The  left ventricle has normal function. The left ventricle has no regional  wall motion abnormalities. There is mild left ventricular hypertrophy.  Left ventricular diastolic parameters  are consistent with Grade I diastolic dysfunction (impaired relaxation).   2. Right ventricular systolic function is normal. The right ventricular  size is normal. Tricuspid regurgitation signal is inadequate for assessing  PA pressure.   3. No evidence of mitral valve regurgitation.   4. The aortic valve is calcified. Aortic valve regurgitation is mild.   5. The inferior  vena cava is normal in size with greater than 50%  respiratory variability, suggesting right atrial pressure of 3 mmHg.   MRI brain 07/08/2021: 1. Interval expansion of previously identified left basal ganglia/corona radiata infarct, now measuring up to 3.6 cm. No associated hemorrhage or significant regional mass effect. 2. No other acute intracranial abnormality. 3. Underlying age-related cerebral atrophy with moderate chronic microvascular ischemic disease.  CTA head/neck 05/03/2021: No intracranial large vessel occlusion or correctable proximal stenosis. Some distal vessel atherosclerotic irregularity.   Both carotid bifurcations are widely patent.   Aortic Atherosclerosis     Recent labs: 07/19/2022: Glucose 135, BUN/Cr 13/0.7. EGFR >60. Na/K 140/3.8. ALT 5. Lipase 66. Rest of the CMP normal H/H 14/44. MCV 89. Platelets 333  07/10/2021: Glucose 109, BUN/Cr 9/0.5. EGFR >60. Na/K 131/3.3. H/H 14/41. MCV 87. Platelets 370 HbA1C 5.7% Chol 120, TG 79, HDL 65, LDL 39   Review of Systems  Cardiovascular:  Negative for chest pain, dyspnea on exertion, leg swelling, palpitations and syncope.  Neurological:  Positive for loss of balance and weakness.  Psychiatric/Behavioral:  Positive for memory loss. The patient is nervous/anxious.          Vitals:   12/02/22 1304  BP: (!) 142/65  Pulse: 75  Resp: 16  SpO2: 98%     Body mass index is 25.75 kg/m. Filed Weights   12/02/22 1304  Weight: 150 lb (68 kg)     Objective:   Physical Exam Vitals and nursing note reviewed.  Constitutional:      General: She is not in acute distress. Neck:     Vascular: No JVD.  Cardiovascular:     Rate and Rhythm: Normal rate and regular rhythm.     Heart sounds: Normal heart sounds. No murmur heard. Pulmonary:     Effort: Pulmonary effort is normal.     Breath sounds: Normal breath sounds. No wheezing or rales.  Musculoskeletal:     Right lower leg: No edema.     Left lower  leg: No edema.  Neurological:     General: No focal deficit present.     Mental Status: She is oriented to person, place, and time.       Assessment & Recommendations:    82 y.o. Caucasian female with hypertension, hyperlipidemia, prediabetes, glaucoma, breast cancer s/p b/l mastectomy, with stroke (06/2021).  Stroke: H/o left basal ganglia/corona radiata infarct. No clear etiology identified. Cardiac telemetry without evidence of A-fib.   Loop recorder could be consideration, but okay to hold off given there has been no recurrence of TIA/stroke symptoms in over a year.   Continue plavix, statin.  Hypertension: Generally well-controlled.  Systolic blood pressure slightly over 140 today.  No changes made today.  Preoperative stratification: Low perioperative cardiac risk for hip surgery.  Given her mild AI noted in 06/2021, I will obtain an echocardiogram.  I do not anticipate that her perioperative risk will be significantly different based on echocardiogram results.  Okay to schedule for hip surgery. Okay to hold Plavix for 3-5 days before the surgery, resume at the earliest after surgery.  I will see her on as-needed basis.    Elder Negus, MD Pager: [phone] Office: [phone]

## 2022-12-18 DIAGNOSIS — R262 Difficulty in walking, not elsewhere classified: Secondary | ICD-10-CM | POA: Diagnosis not present

## 2022-12-18 DIAGNOSIS — M25652 Stiffness of left hip, not elsewhere classified: Secondary | ICD-10-CM | POA: Diagnosis not present

## 2022-12-18 DIAGNOSIS — M1612 Unilateral primary osteoarthritis, left hip: Secondary | ICD-10-CM | POA: Diagnosis not present

## 2022-12-19 ENCOUNTER — Ambulatory Visit: Payer: Medicare Other

## 2022-12-19 DIAGNOSIS — I351 Nonrheumatic aortic (valve) insufficiency: Secondary | ICD-10-CM

## 2022-12-19 DIAGNOSIS — Z01818 Encounter for other preprocedural examination: Secondary | ICD-10-CM | POA: Diagnosis not present

## 2022-12-24 DIAGNOSIS — R351 Nocturia: Secondary | ICD-10-CM | POA: Diagnosis not present

## 2022-12-24 DIAGNOSIS — N281 Cyst of kidney, acquired: Secondary | ICD-10-CM | POA: Diagnosis not present

## 2022-12-24 DIAGNOSIS — R350 Frequency of micturition: Secondary | ICD-10-CM | POA: Diagnosis not present

## 2022-12-24 DIAGNOSIS — N3946 Mixed incontinence: Secondary | ICD-10-CM | POA: Diagnosis not present

## 2022-12-26 ENCOUNTER — Other Ambulatory Visit: Payer: Self-pay | Admitting: Urology

## 2022-12-26 DIAGNOSIS — N281 Cyst of kidney, acquired: Secondary | ICD-10-CM

## 2023-01-08 DIAGNOSIS — M25552 Pain in left hip: Secondary | ICD-10-CM | POA: Diagnosis not present

## 2023-01-08 DIAGNOSIS — M1612 Unilateral primary osteoarthritis, left hip: Secondary | ICD-10-CM | POA: Diagnosis not present

## 2023-01-09 DIAGNOSIS — N3946 Mixed incontinence: Secondary | ICD-10-CM | POA: Diagnosis not present

## 2023-01-17 DIAGNOSIS — Z96642 Presence of left artificial hip joint: Secondary | ICD-10-CM | POA: Diagnosis not present

## 2023-01-17 DIAGNOSIS — M1612 Unilateral primary osteoarthritis, left hip: Secondary | ICD-10-CM | POA: Diagnosis not present

## 2023-01-20 ENCOUNTER — Non-Acute Institutional Stay (SKILLED_NURSING_FACILITY): Payer: Medicare Other | Admitting: Internal Medicine

## 2023-01-20 ENCOUNTER — Encounter: Payer: Self-pay | Admitting: Internal Medicine

## 2023-01-20 DIAGNOSIS — M6281 Muscle weakness (generalized): Secondary | ICD-10-CM | POA: Diagnosis not present

## 2023-01-20 DIAGNOSIS — M25552 Pain in left hip: Secondary | ICD-10-CM | POA: Diagnosis not present

## 2023-01-20 DIAGNOSIS — Z96642 Presence of left artificial hip joint: Secondary | ICD-10-CM

## 2023-01-20 DIAGNOSIS — M1612 Unilateral primary osteoarthritis, left hip: Secondary | ICD-10-CM | POA: Diagnosis not present

## 2023-01-20 DIAGNOSIS — E7800 Pure hypercholesterolemia, unspecified: Secondary | ICD-10-CM | POA: Diagnosis not present

## 2023-01-20 DIAGNOSIS — I679 Cerebrovascular disease, unspecified: Secondary | ICD-10-CM | POA: Diagnosis not present

## 2023-01-20 DIAGNOSIS — F329 Major depressive disorder, single episode, unspecified: Secondary | ICD-10-CM | POA: Diagnosis not present

## 2023-01-20 DIAGNOSIS — I10 Essential (primary) hypertension: Secondary | ICD-10-CM

## 2023-01-20 DIAGNOSIS — M62552 Muscle wasting and atrophy, not elsewhere classified, left thigh: Secondary | ICD-10-CM | POA: Diagnosis not present

## 2023-01-20 DIAGNOSIS — Z471 Aftercare following joint replacement surgery: Secondary | ICD-10-CM | POA: Diagnosis not present

## 2023-01-20 DIAGNOSIS — R278 Other lack of coordination: Secondary | ICD-10-CM | POA: Diagnosis not present

## 2023-01-20 DIAGNOSIS — R2689 Other abnormalities of gait and mobility: Secondary | ICD-10-CM | POA: Diagnosis not present

## 2023-01-20 DIAGNOSIS — G3184 Mild cognitive impairment, so stated: Secondary | ICD-10-CM

## 2023-01-20 NOTE — Progress Notes (Addendum)
Location:  [facility] Room Number: NO/158/A Place of Service:  SNF [phone]) Provider:  Einar Crow , MD  Patient Care Team: Ollen Bowl, MD as PCP - General (Internal Medicine) Jake Bathe, MD as PCP - Cardiology (Cardiology)  Extended Emergency Contact Information Primary Emergency Contact: Everton,Maurice Address: [address]          [place], [place] [address] D[place] of [place] Home Phone: [phone] Mobile Phone: [phone] Relation: Spouse Secondary Emergency Contact: Swanton,Adam Mobile Phone: [phone] Relation: Son  Code Status:  DNR Goals of care: Advanced Directive information    01/20/2023   10:49 AM  Advanced Directives  Does Patient Have a Medical Advance Directive? Yes  Type of Advance Directive Living will  Does patient want to make changes to medical advance directive? No - Patient declined     Chief Complaint  Patient presents with   Medical Management of Chronic Issues    Patient is here for medication review and admission follow up after hospital stay, low BP need 2nd reading for care gap metric     HPI:  Pt is a 82 y.o. female seen today for medical management of chronic diseases.    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 Doing well in Rehab She Is WBAT Tylenol helping pain Husband wondering if she needs PRN Robaxin Walking with Mild assist and her walker  Lives with her husband Walks with her 4 wheel walker at baseline Has Some cognitive issues but doing well with her ADLS   Past Medical History:  Diagnosis Date   Breast cancer (HCC) 1993   bilateral mastectomy; no chemo or  radiation   Glaucoma    Hyperlipidemia    Hypertension    Past Surgical History:  Procedure Laterality Date   MASTECTOMY Bilateral 1993   TONSILLECTOMY AND ADENOIDECTOMY  1947   TUBAL LIGATION  1974    Allergies  Allergen Reactions   Crab [Shellfish Allergy] Other (See Comments)    From skin test   Fosamax [Alendronate Sodium] Other (See Comments)    Difficult swallowing   Hydrochlorothiazide Other (See Comments)    hyponatremia   Losartan Cough   Raloxifene Other (See Comments)    Unknown reaction   Serotonin Reuptake Inhibitors (Ssris) Other (See Comments)    Hyponatremia   Ace Inhibitors Cough    Reaction to losartan (pt tolerates valsartan)    Outpatient Encounter Medications as of 01/20/2023  Medication Sig   acetaminophen (TYLENOL) 325 MG tablet Take 2 tablets (650 mg total) by mouth every 4 (four) hours as needed for mild pain (or temp > 37.5 C (99.5 F)).   amLODipine (NORVASC) 5 MG tablet Take 1 tablet (5 mg total) by mouth daily.   brinzolamide (AZOPT) 1 % ophthalmic suspension Place 1 drop into both eyes 2 (two) times daily.    clopidogrel (PLAVIX) 75 MG tablet Plavix 75 mg daily beginning 07/26/2021   donepezil (ARICEPT) 5 MG tablet Take 1 tablet (5 mg total) by mouth at bedtime.   melatonin 3 MG TABS tablet Take 6 mg by mouth at bedtime as needed (sleep).   mirtazapine (REMERON) 15 MG tablet Take 15 mg by mouth at bedtime.   MYRBETRIQ 25 MG  TB24 tablet Take 25 mg by mouth daily.   rosuvastatin (CRESTOR) 10 MG tablet Take 1 tablet (10 mg total) by mouth daily.   timolol (TIMOPTIC) 0.5 % ophthalmic solution Place 1 drop into both eyes 2 (two) times daily.   valsartan (DIOVAN) 320 MG tablet Take 1 tablet (320 mg total) by mouth daily.   Vitamins A & D (VITAMIN A & D) ointment Apply 1 application. topically as needed for dry skin.   No facility-administered encounter medications on file as of 01/20/2023.    Review of Systems  Constitutional:  Positive for  activity change. Negative for appetite change.  HENT: Negative.    Respiratory:  Negative for cough and shortness of breath.   Cardiovascular:  Negative for leg swelling.  Gastrointestinal:  Negative for constipation.  Genitourinary: Negative.   Musculoskeletal:  Positive for gait problem. Negative for arthralgias and myalgias.  Skin: Negative.   Neurological:  Negative for dizziness and weakness.  Psychiatric/Behavioral:  Positive for confusion. Negative for dysphoric mood and sleep disturbance.     Immunization History  Administered Date(s) Administered   H1N1 08/23/2008   Influenza Split 05/17/2008, 06/02/2009, 04/19/2011   Influenza,inj,quad, With Preservative 04/06/2014   Influenza-Unspecified 05/14/2012, 04/05/2013, 04/13/2015, 05/09/2016, 05/15/2017, 06/30/2018, 04/10/2019, 06/03/2020, 04/11/2021   Moderna Covid-19 Vaccine Bivalent Booster 59yrs & up 05/11/2021   Moderna SARS-COV2 Booster Vaccination 01/16/2021, 11/22/2021   Moderna Sars-Covid-2 Vaccination 08/10/2019, 09/07/2019, 06/13/2020, 01/16/2021   Pneumococcal Conjugate-13 04/06/2014   Pneumococcal Polysaccharide-23 08/20/2007   Td 11/27/1998, 09/25/2020   Tdap 08/20/2007   Zoster Recombinat (Shingrix) 11/14/2016, 05/22/2017   Zoster, Live 02/26/2006, 11/14/2016, 05/22/2017   Pertinent  Health Maintenance Due  Topic Date Due   INFLUENZA VACCINE  02/27/2023   DEXA SCAN  Completed      08/28/2021    8:21 AM 02/24/2022    9:39 PM 05/28/2022    9:38 PM 07/19/2022    2:52 PM 01/20/2023   10:50 AM  Fall Risk  Falls in the past year?     0  Was there an injury with Fall?     0  Fall Risk Category Calculator     0  (RETIRED) Patient Fall Risk Level High fall risk Low fall risk Low fall risk Low fall risk   Patient at Risk for Falls Due to     History of fall(s)  Fall risk Follow up     Falls evaluation completed   Functional Status Survey:    Vitals:   01/20/23 1048  BP: (!) 110/53  Pulse: 88  Resp: 17   Temp: 98.2 F (36.8 C)  SpO2: 95%  Weight: 149 lb 11.2 oz (67.9 kg)  Height: 5\' 4"  (1.626 m)   Body mass index is 25.7 kg/m. Physical Exam Vitals reviewed.  Constitutional:      Appearance: Normal appearance.  HENT:     Head: Normocephalic.     Nose: Nose normal.     Mouth/Throat:     Mouth: Mucous membranes are moist.     Pharynx: Oropharynx is clear.  Eyes:     Pupils: Pupils are equal, round, and reactive to light.  Cardiovascular:     Rate and Rhythm: Normal rate and regular rhythm.     Pulses: Normal pulses.     Heart sounds: Normal heart sounds. No murmur heard. Pulmonary:     Effort: Pulmonary effort is normal.     Breath sounds: Normal breath sounds.  Abdominal:     General: Abdomen is flat. Bowel  sounds are normal.     Palpations: Abdomen is soft.  Musculoskeletal:        General: No swelling.     Cervical back: Neck supple.  Skin:    General: Skin is warm.  Neurological:     General: No focal deficit present.     Mental Status: She is alert and oriented to person, place, and time.  Psychiatric:        Mood and Affect: Mood normal.        Thought Content: Thought content normal.    Labs reviewed: Recent Labs    02/24/22 2236 05/28/22 2144 07/19/22 1454  NA 137 140 140  K 3.5 3.7 3.8  CL 108 99 100  CO2 22 24 29   GLUCOSE 147* 144* 135*  BUN 10 12 13   CREATININE 0.67 0.89 0.70  CALCIUM 8.2* 9.4 9.9   Recent Labs    02/24/22 2236 07/19/22 1454  AST 24 40  ALT 28 45*  ALKPHOS 54 57  BILITOT 0.7 0.4  PROT 5.6* 7.5  ALBUMIN 3.3* 4.5   Recent Labs    02/24/22 2236 05/28/22 2144 07/19/22 1454  WBC 8.0 11.7* 7.5  NEUTROABS 5.0  --   --   HGB 12.5 13.0 14.6  HCT 38.8 39.3 44.1  MCV 90.7 91.4 89.3  PLT 288 272 333   Lab Results  Component Value Date   TSH 1.117 06/25/2021   Lab Results  Component Value Date   HGBA1C 5.7 (H) 07/09/2021   Lab Results  Component Value Date   CHOL 120 07/09/2021   HDL 65 07/09/2021   LDLCALC 39  07/09/2021   TRIG 79 07/09/2021   CHOLHDL 1.8 07/09/2021    Significant Diagnostic Results in last 30 days:  No results found.  Assessment/Plan 1. History of left hip replacement WBAT Tylenol for pain Robaxin 250 mg Q8 PRN for pain Work with therapy On Aspirin for DVT prophylaxis Start on Prilosec 20 mg every day till on Aspirin Dual therapy  2. Essential hypertension Continue monitoring BP On Norvasc and Valsartan  3. Mild cognitive impairment On Aricept now Doing well here No behaviors issues  4. Cerebrovascular disease On statin and Plavix  5. Pure hypercholesterolemia statin  6. 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

## 2023-01-21 DIAGNOSIS — R278 Other lack of coordination: Secondary | ICD-10-CM | POA: Diagnosis not present

## 2023-01-21 DIAGNOSIS — Z471 Aftercare following joint replacement surgery: Secondary | ICD-10-CM | POA: Diagnosis not present

## 2023-01-21 DIAGNOSIS — M1612 Unilateral primary osteoarthritis, left hip: Secondary | ICD-10-CM | POA: Diagnosis not present

## 2023-01-21 DIAGNOSIS — R2689 Other abnormalities of gait and mobility: Secondary | ICD-10-CM | POA: Diagnosis not present

## 2023-01-21 DIAGNOSIS — M6281 Muscle weakness (generalized): Secondary | ICD-10-CM | POA: Diagnosis not present

## 2023-01-21 DIAGNOSIS — M62552 Muscle wasting and atrophy, not elsewhere classified, left thigh: Secondary | ICD-10-CM | POA: Diagnosis not present

## 2023-01-22 DIAGNOSIS — Z471 Aftercare following joint replacement surgery: Secondary | ICD-10-CM | POA: Diagnosis not present

## 2023-01-22 DIAGNOSIS — M62552 Muscle wasting and atrophy, not elsewhere classified, left thigh: Secondary | ICD-10-CM | POA: Diagnosis not present

## 2023-01-22 DIAGNOSIS — M6281 Muscle weakness (generalized): Secondary | ICD-10-CM | POA: Diagnosis not present

## 2023-01-22 DIAGNOSIS — R2689 Other abnormalities of gait and mobility: Secondary | ICD-10-CM | POA: Diagnosis not present

## 2023-01-22 DIAGNOSIS — R278 Other lack of coordination: Secondary | ICD-10-CM | POA: Diagnosis not present

## 2023-01-22 DIAGNOSIS — M1612 Unilateral primary osteoarthritis, left hip: Secondary | ICD-10-CM | POA: Diagnosis not present

## 2023-01-23 DIAGNOSIS — M6281 Muscle weakness (generalized): Secondary | ICD-10-CM | POA: Diagnosis not present

## 2023-01-23 DIAGNOSIS — Z471 Aftercare following joint replacement surgery: Secondary | ICD-10-CM | POA: Diagnosis not present

## 2023-01-23 DIAGNOSIS — R2689 Other abnormalities of gait and mobility: Secondary | ICD-10-CM | POA: Diagnosis not present

## 2023-01-23 DIAGNOSIS — R278 Other lack of coordination: Secondary | ICD-10-CM | POA: Diagnosis not present

## 2023-01-23 DIAGNOSIS — M62552 Muscle wasting and atrophy, not elsewhere classified, left thigh: Secondary | ICD-10-CM | POA: Diagnosis not present

## 2023-01-23 DIAGNOSIS — M1612 Unilateral primary osteoarthritis, left hip: Secondary | ICD-10-CM | POA: Diagnosis not present

## 2023-01-24 DIAGNOSIS — M62552 Muscle wasting and atrophy, not elsewhere classified, left thigh: Secondary | ICD-10-CM | POA: Diagnosis not present

## 2023-01-24 DIAGNOSIS — R278 Other lack of coordination: Secondary | ICD-10-CM | POA: Diagnosis not present

## 2023-01-24 DIAGNOSIS — M1612 Unilateral primary osteoarthritis, left hip: Secondary | ICD-10-CM | POA: Diagnosis not present

## 2023-01-24 DIAGNOSIS — R2689 Other abnormalities of gait and mobility: Secondary | ICD-10-CM | POA: Diagnosis not present

## 2023-01-24 DIAGNOSIS — M6281 Muscle weakness (generalized): Secondary | ICD-10-CM | POA: Diagnosis not present

## 2023-01-24 DIAGNOSIS — Z471 Aftercare following joint replacement surgery: Secondary | ICD-10-CM | POA: Diagnosis not present

## 2023-01-27 DIAGNOSIS — Z471 Aftercare following joint replacement surgery: Secondary | ICD-10-CM | POA: Diagnosis not present

## 2023-01-27 DIAGNOSIS — R278 Other lack of coordination: Secondary | ICD-10-CM | POA: Diagnosis not present

## 2023-01-27 DIAGNOSIS — M62552 Muscle wasting and atrophy, not elsewhere classified, left thigh: Secondary | ICD-10-CM | POA: Diagnosis not present

## 2023-01-27 DIAGNOSIS — M25552 Pain in left hip: Secondary | ICD-10-CM | POA: Diagnosis not present

## 2023-01-27 DIAGNOSIS — R2689 Other abnormalities of gait and mobility: Secondary | ICD-10-CM | POA: Diagnosis not present

## 2023-01-27 DIAGNOSIS — M6281 Muscle weakness (generalized): Secondary | ICD-10-CM | POA: Diagnosis not present

## 2023-01-27 DIAGNOSIS — M1612 Unilateral primary osteoarthritis, left hip: Secondary | ICD-10-CM | POA: Diagnosis not present

## 2023-01-28 DIAGNOSIS — R2689 Other abnormalities of gait and mobility: Secondary | ICD-10-CM | POA: Diagnosis not present

## 2023-01-28 DIAGNOSIS — M6281 Muscle weakness (generalized): Secondary | ICD-10-CM | POA: Diagnosis not present

## 2023-01-28 DIAGNOSIS — M1612 Unilateral primary osteoarthritis, left hip: Secondary | ICD-10-CM | POA: Diagnosis not present

## 2023-01-28 DIAGNOSIS — R278 Other lack of coordination: Secondary | ICD-10-CM | POA: Diagnosis not present

## 2023-01-28 DIAGNOSIS — M62552 Muscle wasting and atrophy, not elsewhere classified, left thigh: Secondary | ICD-10-CM | POA: Diagnosis not present

## 2023-01-28 DIAGNOSIS — Z471 Aftercare following joint replacement surgery: Secondary | ICD-10-CM | POA: Diagnosis not present

## 2023-01-29 ENCOUNTER — Non-Acute Institutional Stay (SKILLED_NURSING_FACILITY): Payer: Medicare Other | Admitting: Orthopedic Surgery

## 2023-01-29 ENCOUNTER — Encounter: Payer: Self-pay | Admitting: Orthopedic Surgery

## 2023-01-29 DIAGNOSIS — Z96642 Presence of left artificial hip joint: Secondary | ICD-10-CM | POA: Diagnosis not present

## 2023-01-29 DIAGNOSIS — E782 Mixed hyperlipidemia: Secondary | ICD-10-CM | POA: Diagnosis not present

## 2023-01-29 DIAGNOSIS — R4189 Other symptoms and signs involving cognitive functions and awareness: Secondary | ICD-10-CM | POA: Diagnosis not present

## 2023-01-29 DIAGNOSIS — I10 Essential (primary) hypertension: Secondary | ICD-10-CM | POA: Diagnosis not present

## 2023-01-29 DIAGNOSIS — F329 Major depressive disorder, single episode, unspecified: Secondary | ICD-10-CM

## 2023-01-29 DIAGNOSIS — I679 Cerebrovascular disease, unspecified: Secondary | ICD-10-CM

## 2023-01-29 NOTE — Progress Notes (Signed)
[facility] Room Number: 158-A Place of Service:  SNF (31)  Provider: Hazle Nordmann, NP  PCP: Ollen Bowl, MD Patient Care Team: Ollen Bowl, MD as PCP - General (Internal Medicine) Jake Bathe, MD as PCP - Cardiology (Cardiology)  Extended Emergency Contact Information Primary Emergency Contact: Brahmbhatt,Maurice Address: [address]          [place], [place] [address] Darden Amber of [place] Home Phone: [phone] Mobile Phone: [phone] Relation: Spouse Secondary Emergency Contact: Cookston,Adam Mobile Phone: [phone] Relation: Son  Code Status: * Goals of care:  Advanced Directive information    01/29/2023    9:09 AM  Advanced Directives  Does Patient Have a Medical Advance Directive? Yes  Type of Advance Directive Living will;Out of facility DNR (pink MOST or yellow form)  Does patient want to make changes to medical advance directive? No - Patient declined     Allergies  Allergen Reactions   Crab [Shellfish Allergy] Other (See Comments)    From skin test   Fosamax [Alendronate Sodium] Other (See Comments)    Difficult swallowing   Hydrochlorothiazide Other (See Comments)    hyponatremia   Losartan Cough   Raloxifene Other (See Comments)    Unknown reaction   Serotonin Reuptake Inhibitors (Ssris) Other (See Comments)    Hyponatremia   Ace Inhibitors Cough    Reaction to losartan (pt tolerates valsartan)    Chief Complaint  Patient presents with   Discharge Note    Discharge from SNF     HPI:  82 y.o. female seen today for discharge evaluation.   She currently resides on the rehabilitation unit at [facility]. PMH: HTN, HLD, CVA, aortic insufficiency, MCI, dysphagia, OA, osteoporosis, urinary incontinence, and depression.   06/21 she underwent elective anterior left hip arthroplasty by Dr. Turner Daniels. She tolerated procedure well and was discharged to [facility] rehabilitation.    Today, she plans to discharge home. At this time she is WBAT. Pain controlled with tylenol and robaxin prn. Ambulating with walker, minimal assistance > 100 ft. DVT prophylaxis asa and Plavix. She was evaluated by Dr. Turner Daniels 01/28/2023. Surgical dressing was removed and compression stockings made prn. LBM 01/28/2023. Appetite fair. Afebrile. Vitals stable. HHPT ordered for home. Husband to help with care at home.   Past Medical History:  Diagnosis Date   Breast cancer (HCC) 1993   bilateral mastectomy; no chemo or radiation   Glaucoma    Hyperlipidemia    Hypertension     Past Surgical History:  Procedure Laterality Date   MASTECTOMY Bilateral 1993   TONSILLECTOMY AND ADENOIDECTOMY  1947   TUBAL LIGATION  1974      reports that she quit smoking about 59 years ago. Her smoking use included cigarettes. She has a 5.00 pack-year smoking history. She has never used smokeless tobacco. She reports that she does not currently use alcohol after a past usage of about 21.0 standard drinks of alcohol per week. She reports that she does not use drugs. Social History   Socioeconomic History   Marital status: Married    Spouse name: Not on file   Number of children: 2   Years of education: Not on file   Highest education level: Not on file  Occupational History   Not on file  Tobacco Use   Smoking status: Former    Packs/day: 1.00    Years: 5.00    Additional pack years: 0.00    Total  pack years: 5.00    Types: Cigarettes    Quit date: 07/30/1963    Years since quitting: 59.5   Smokeless tobacco: Never  Vaping Use   Vaping Use: Never used  Substance and Sexual Activity   Alcohol use: Not Currently    Alcohol/week: 21.0 standard drinks of alcohol    Types: 21 Glasses of wine per week    Comment: occ   Drug use: No   Sexual activity: Not Currently  Other Topics Concern   Not on file  Social History Narrative   Not on file   Social Determinants of Health   Financial Resource  Strain: Not on file  Food Insecurity: Not on file  Transportation Needs: Not on file  Physical Activity: Not on file  Stress: Not on file  Social Connections: Not on file  Intimate Partner Violence: Not on file   Functional Status Survey:    Allergies  Allergen Reactions   Crab [Shellfish Allergy] Other (See Comments)    From skin test   Fosamax [Alendronate Sodium] Other (See Comments)    Difficult swallowing   Hydrochlorothiazide Other (See Comments)    hyponatremia   Losartan Cough   Raloxifene Other (See Comments)    Unknown reaction   Serotonin Reuptake Inhibitors (Ssris) Other (See Comments)    Hyponatremia   Ace Inhibitors Cough    Reaction to losartan (pt tolerates valsartan)    Pertinent  Health Maintenance Due  Topic Date Due   INFLUENZA VACCINE  02/27/2023   DEXA SCAN  Completed    Medications: Allergies as of 01/29/2023       Reactions   Crab [shellfish Allergy] Other (See Comments)   From skin test   Fosamax [alendronate Sodium] Other (See Comments)   Difficult swallowing   Hydrochlorothiazide Other (See Comments)   hyponatremia   Losartan Cough   Raloxifene Other (See Comments)   Unknown reaction   Serotonin Reuptake Inhibitors (ssris) Other (See Comments)   Hyponatremia   Ace Inhibitors Cough   Reaction to losartan (pt tolerates valsartan)        Medication List        Accurate as of January 29, 2023  9:09 AM. If you have any questions, ask your nurse or doctor.          STOP taking these medications    timolol 0.5 % ophthalmic solution Commonly known as: TIMOPTIC Stopped by: Octavia Heir, NP   vitamin A & D ointment Stopped by: Octavia Heir, NP       TAKE these medications    acetaminophen 325 MG tablet Commonly known as: TYLENOL Take 650 mg by mouth in the morning. What changed: Another medication with the same name was removed. Continue taking this medication, and follow the directions you see here. Changed by: Octavia Heir,  NP   acetaminophen 325 MG tablet Commonly known as: TYLENOL Take 650 mg by mouth every 4 (four) hours as needed. What changed: Another medication with the same name was removed. Continue taking this medication, and follow the directions you see here. Changed by: Octavia Heir, NP   amLODipine 5 MG tablet Commonly known as: NORVASC Take 1 tablet (5 mg total) by mouth daily.   aspirin 81 MG chewable tablet Chew 81 mg by mouth every morning.   brinzolamide 1 % ophthalmic suspension Commonly known as: AZOPT Place 1 drop into both eyes 2 (two) times daily.   calcium carbonate 1500 (600 Ca) MG Tabs tablet  Commonly known as: OSCAL Take 600 mg of elemental calcium by mouth at bedtime.   cholecalciferol 25 MCG (1000 UNIT) tablet Commonly known as: VITAMIN D3 Take 1,000 Units by mouth at bedtime.   clopidogrel 75 MG tablet Commonly known as: Plavix Plavix 75 mg daily beginning 07/26/2021   docusate sodium 100 MG capsule Commonly known as: COLACE Take 100 mg by mouth every other day.   donepezil 5 MG tablet Commonly known as: ARICEPT Take 1 tablet (5 mg total) by mouth at bedtime.   melatonin 3 MG Tabs tablet Take 3 mg by mouth at bedtime.   methocarbamol 500 MG tablet Commonly known as: ROBAXIN Take 500 mg by mouth every 8 (eight) hours as needed for muscle spasms.   mirtazapine 15 MG tablet Commonly known as: REMERON Take 15 mg by mouth at bedtime.   Myrbetriq 25 MG Tb24 tablet Generic drug: mirabegron ER Take 25 mg by mouth daily.   omeprazole 20 MG capsule Commonly known as: PRILOSEC Take 20 mg by mouth daily.   ondansetron 4 MG tablet Commonly known as: ZOFRAN Take 4 mg by mouth every 8 (eight) hours as needed for nausea or vomiting. Take every 6-8 hours as needed   polyethylene glycol 17 g packet Commonly known as: MIRALAX / GLYCOLAX Take 17 g by mouth as directed. Sunday & Wednesday   polyethylene glycol 17 g packet Commonly known as: MIRALAX /  GLYCOLAX Take 17 g by mouth as needed.   rosuvastatin 10 MG tablet Commonly known as: CRESTOR Take 1 tablet (10 mg total) by mouth daily.   timolol 0.5 % ophthalmic solution Commonly known as: BETIMOL Place 1 drop into both eyes 2 (two) times daily. Drop one drop in both eyes twice a day   valsartan 320 MG tablet Commonly known as: DIOVAN Take 1 tablet (320 mg total) by mouth daily.        Review of Systems  Constitutional:  Negative for activity change and appetite change.  HENT:  Negative for sore throat and trouble swallowing.   Eyes:  Negative for visual disturbance.  Respiratory:  Negative for cough, shortness of breath and wheezing.   Cardiovascular:  Negative for chest pain and leg swelling.  Gastrointestinal:  Negative for abdominal distention, abdominal pain and constipation.  Genitourinary:  Negative for dysuria and hematuria.  Musculoskeletal:  Positive for arthralgias and gait problem.  Skin:        Surgical incision  Neurological:  Negative for dizziness, weakness and light-headedness.  Psychiatric/Behavioral:  Positive for dysphoric mood. Negative for confusion and sleep disturbance. The patient is not nervous/anxious.     Vitals:   01/29/23 0901  BP: 137/67  Pulse: 73  Resp: 15  Temp: 98 F (36.7 C)  SpO2: 96%  Weight: 149 lb 12.8 oz (67.9 kg)  Height: 5\' 4"  (1.626 m)   Body mass index is 25.71 kg/m. Physical Exam Vitals reviewed.  Constitutional:      General: She is not in acute distress. HENT:     Head: Normocephalic.  Eyes:     General:        Right eye: No discharge.        Left eye: No discharge.  Cardiovascular:     Rate and Rhythm: Normal rate and regular rhythm.     Pulses: Normal pulses.     Heart sounds: Normal heart sounds.  Pulmonary:     Effort: Pulmonary effort is normal. No respiratory distress.     Breath sounds: Normal  breath sounds. No wheezing or rales.  Abdominal:     General: Bowel sounds are normal. There is no  distension.     Palpations: Abdomen is soft.     Tenderness: There is no abdominal tenderness.  Musculoskeletal:     Cervical back: Neck supple.     Right lower leg: No edema.     Left lower leg: No edema.  Skin:    General: Skin is warm.     Capillary Refill: Capillary refill takes less than 2 seconds.     Comments: Surgical incision to left anterior hip, CDI, closed, no drainage/erythema/swelling.   Neurological:     General: No focal deficit present.     Mental Status: She is alert and oriented to person, place, and time.  Psychiatric:        Mood and Affect: Mood normal.     Labs reviewed: Basic Metabolic Panel: Recent Labs    02/24/22 2236 05/28/22 2144 07/19/22 1454  NA 137 140 140  K 3.5 3.7 3.8  CL 108 99 100  CO2 22 24 29   GLUCOSE 147* 144* 135*  BUN 10 12 13   CREATININE 0.67 0.89 0.70  CALCIUM 8.2* 9.4 9.9   Liver Function Tests: Recent Labs    02/24/22 2236 07/19/22 1454  AST 24 40  ALT 28 45*  ALKPHOS 54 57  BILITOT 0.7 0.4  PROT 5.6* 7.5  ALBUMIN 3.3* 4.5   Recent Labs    07/19/22 1454  LIPASE 66*   No results for input(s): "AMMONIA" in the last 8760 hours. CBC: Recent Labs    02/24/22 2236 05/28/22 2144 07/19/22 1454  WBC 8.0 11.7* 7.5  NEUTROABS 5.0  --   --   HGB 12.5 13.0 14.6  HCT 38.8 39.3 44.1  MCV 90.7 91.4 89.3  PLT 288 272 333   Cardiac Enzymes: No results for input(s): "CKTOTAL", "CKMB", "CKMBINDEX", "TROPONINI" in the last 8760 hours. BNP: Invalid input(s): "POCBNP" CBG: No results for input(s): "GLUCAP" in the last 8760 hours.  Procedures and Imaging Studies During Stay: No results found.  Assessment/Plan:   1. S/P hip replacement, left - 06/21 LATH> Dr. Turner Daniels f/u 07/02 surgical dressing removed, does not need ted hose - WBAT - pain controlled with tylenol prn and robaxin - asa and Plavix DVT prophylaxis - cont HHPT  2. Essential hypertension - controlled with amlodipine and valsartan  3. Mixed  hyperlipidemia - cont rosuvastatin  4. Cerebrovascular disease - cont plavix and rosuvastatin  5. Reactive depression - no mood changes - cont mirtazapine  6. Cognitive impairment - MMSE 23/30> 09/2021 - no behaviors - cont Aricept  Patient is being discharged with the following home health services:  HHPT  Patient is being discharged with the following durable medical equipment:  None  Patient has been advised to f/u with their PCP in 1-2 weeks to bring them up to date on their rehab stay.  Social services at facility was responsible for arranging this appointment.  Pt was provided with a 30 day supply of prescriptions for medications and refills must be obtained from their PCP.  For controlled substances, a more limited supply may be provided adequate until PCP appointment only.  Future labs/tests needed:  none

## 2023-01-30 DIAGNOSIS — M6281 Muscle weakness (generalized): Secondary | ICD-10-CM | POA: Diagnosis not present

## 2023-01-30 DIAGNOSIS — R2689 Other abnormalities of gait and mobility: Secondary | ICD-10-CM | POA: Diagnosis not present

## 2023-01-30 DIAGNOSIS — M62552 Muscle wasting and atrophy, not elsewhere classified, left thigh: Secondary | ICD-10-CM | POA: Diagnosis not present

## 2023-01-30 DIAGNOSIS — Z471 Aftercare following joint replacement surgery: Secondary | ICD-10-CM | POA: Diagnosis not present

## 2023-01-30 DIAGNOSIS — M1612 Unilateral primary osteoarthritis, left hip: Secondary | ICD-10-CM | POA: Diagnosis not present

## 2023-01-30 DIAGNOSIS — R278 Other lack of coordination: Secondary | ICD-10-CM | POA: Diagnosis not present

## 2023-02-03 DIAGNOSIS — Z471 Aftercare following joint replacement surgery: Secondary | ICD-10-CM | POA: Diagnosis not present

## 2023-02-03 DIAGNOSIS — M62552 Muscle wasting and atrophy, not elsewhere classified, left thigh: Secondary | ICD-10-CM | POA: Diagnosis not present

## 2023-02-03 DIAGNOSIS — M1612 Unilateral primary osteoarthritis, left hip: Secondary | ICD-10-CM | POA: Diagnosis not present

## 2023-02-03 DIAGNOSIS — R2689 Other abnormalities of gait and mobility: Secondary | ICD-10-CM | POA: Diagnosis not present

## 2023-02-03 DIAGNOSIS — M6281 Muscle weakness (generalized): Secondary | ICD-10-CM | POA: Diagnosis not present

## 2023-02-03 DIAGNOSIS — R278 Other lack of coordination: Secondary | ICD-10-CM | POA: Diagnosis not present

## 2023-02-05 DIAGNOSIS — R350 Frequency of micturition: Secondary | ICD-10-CM | POA: Diagnosis not present

## 2023-02-05 DIAGNOSIS — N3946 Mixed incontinence: Secondary | ICD-10-CM | POA: Diagnosis not present

## 2023-02-05 DIAGNOSIS — N281 Cyst of kidney, acquired: Secondary | ICD-10-CM | POA: Diagnosis not present

## 2023-02-17 ENCOUNTER — Ambulatory Visit
Admission: RE | Admit: 2023-02-17 | Discharge: 2023-02-17 | Disposition: A | Discharge status: Home / Self Care | Payer: Medicare Other | Source: Ambulatory Visit | Attending: Urology | Admitting: Urology

## 2023-02-17 DIAGNOSIS — N2889 Other specified disorders of kidney and ureter: Secondary | ICD-10-CM | POA: Diagnosis not present

## 2023-02-17 DIAGNOSIS — N281 Cyst of kidney, acquired: Secondary | ICD-10-CM

## 2023-02-17 MED ORDER — GADOPICLENOL 0.5 MMOL/ML IV SOLN
7.0000 mL | Freq: Once | INTRAVENOUS | Status: AC | PRN
  Administered 2023-02-17: 7 mL via INTRAVENOUS

## 2023-03-12 DIAGNOSIS — N3946 Mixed incontinence: Secondary | ICD-10-CM | POA: Diagnosis not present

## 2023-03-12 DIAGNOSIS — R350 Frequency of micturition: Secondary | ICD-10-CM | POA: Diagnosis not present

## 2023-04-01 DIAGNOSIS — H52203 Unspecified astigmatism, bilateral: Secondary | ICD-10-CM | POA: Diagnosis not present

## 2023-04-01 DIAGNOSIS — H18513 Endothelial corneal dystrophy, bilateral: Secondary | ICD-10-CM | POA: Diagnosis not present

## 2023-04-01 DIAGNOSIS — Z961 Presence of intraocular lens: Secondary | ICD-10-CM | POA: Diagnosis not present

## 2023-04-01 DIAGNOSIS — H401232 Low-tension glaucoma, bilateral, moderate stage: Secondary | ICD-10-CM | POA: Diagnosis not present

## 2023-05-01 DIAGNOSIS — Z23 Encounter for immunization: Secondary | ICD-10-CM | POA: Diagnosis not present

## 2023-06-24 DIAGNOSIS — R350 Frequency of micturition: Secondary | ICD-10-CM | POA: Diagnosis not present

## 2023-06-24 DIAGNOSIS — R3915 Urgency of urination: Secondary | ICD-10-CM | POA: Diagnosis not present

## 2023-06-24 DIAGNOSIS — N393 Stress incontinence (female) (male): Secondary | ICD-10-CM | POA: Diagnosis not present

## 2023-06-24 DIAGNOSIS — M6281 Muscle weakness (generalized): Secondary | ICD-10-CM | POA: Diagnosis not present

## 2023-07-10 DIAGNOSIS — N3946 Mixed incontinence: Secondary | ICD-10-CM | POA: Diagnosis not present

## 2023-07-10 DIAGNOSIS — R350 Frequency of micturition: Secondary | ICD-10-CM | POA: Diagnosis not present

## 2023-07-14 DIAGNOSIS — R350 Frequency of micturition: Secondary | ICD-10-CM | POA: Diagnosis not present

## 2023-07-14 DIAGNOSIS — M6281 Muscle weakness (generalized): Secondary | ICD-10-CM | POA: Diagnosis not present

## 2023-07-14 DIAGNOSIS — N3946 Mixed incontinence: Secondary | ICD-10-CM | POA: Diagnosis not present

## 2023-07-17 DIAGNOSIS — M1612 Unilateral primary osteoarthritis, left hip: Secondary | ICD-10-CM | POA: Diagnosis not present

## 2023-07-22 ENCOUNTER — Emergency Department (HOSPITAL_BASED_OUTPATIENT_CLINIC_OR_DEPARTMENT_OTHER)
Admission: EM | Admit: 2023-07-22 | Discharge: 2023-07-22 | Disposition: A | Discharge status: Home / Self Care | Payer: Medicare Other | Attending: Emergency Medicine | Admitting: Emergency Medicine

## 2023-07-22 ENCOUNTER — Encounter (HOSPITAL_BASED_OUTPATIENT_CLINIC_OR_DEPARTMENT_OTHER): Payer: Self-pay | Admitting: Radiology

## 2023-07-22 ENCOUNTER — Other Ambulatory Visit: Payer: Self-pay

## 2023-07-22 ENCOUNTER — Emergency Department (HOSPITAL_BASED_OUTPATIENT_CLINIC_OR_DEPARTMENT_OTHER): Payer: Medicare Other

## 2023-07-22 DIAGNOSIS — S0081XA Abrasion of other part of head, initial encounter: Secondary | ICD-10-CM | POA: Insufficient documentation

## 2023-07-22 DIAGNOSIS — S0990XA Unspecified injury of head, initial encounter: Secondary | ICD-10-CM | POA: Diagnosis not present

## 2023-07-22 DIAGNOSIS — S0083XA Contusion of other part of head, initial encounter: Secondary | ICD-10-CM | POA: Diagnosis not present

## 2023-07-22 DIAGNOSIS — Z7982 Long term (current) use of aspirin: Secondary | ICD-10-CM | POA: Diagnosis not present

## 2023-07-22 DIAGNOSIS — Z8673 Personal history of transient ischemic attack (TIA), and cerebral infarction without residual deficits: Secondary | ICD-10-CM | POA: Diagnosis not present

## 2023-07-22 DIAGNOSIS — Z7902 Long term (current) use of antithrombotics/antiplatelets: Secondary | ICD-10-CM | POA: Insufficient documentation

## 2023-07-22 DIAGNOSIS — W01198A Fall on same level from slipping, tripping and stumbling with subsequent striking against other object, initial encounter: Secondary | ICD-10-CM | POA: Insufficient documentation

## 2023-07-22 DIAGNOSIS — W19XXXA Unspecified fall, initial encounter: Secondary | ICD-10-CM

## 2023-07-22 DIAGNOSIS — Z041 Encounter for examination and observation following transport accident: Secondary | ICD-10-CM | POA: Diagnosis not present

## 2023-07-22 DIAGNOSIS — I6782 Cerebral ischemia: Secondary | ICD-10-CM | POA: Diagnosis not present

## 2023-07-22 NOTE — ED Provider Notes (Signed)
[place] EMERGENCY DEPARTMENT AT [facility] Provider Note   CSN: [id number] Arrival date & time: 07/22/23  1207     History  Chief Complaint  Patient presents with   Marletta Lor    ALITA IIAMS is a 82 y.o. female.  HPI   82 year old female with medical history significant for prior CVA on Plavix who presents to the emergency department after a ground-level mechanical fall.  The patient states that she was carrying items at her assisted living facility when she lost her balance while carrying the items and fell striking her forehead on carpet.  No loss of consciousness.  She is on Plavix and has been compliant with the medication.  She is not on anticoagulation.  She denies any other injuries or complaints.  She arrives GCS 15, ABC intact.  Home Medications Prior to Admission medications   Medication Sig Start Date End Date Taking? Authorizing Provider  acetaminophen (TYLENOL) 325 MG tablet Take 650 mg by mouth in the morning.    [provider]  acetaminophen (TYLENOL) 325 MG tablet Take 650 mg by mouth every 4 (four) hours as needed.    [provider]  amLODipine (NORVASC) 5 MG tablet Take 1 tablet (5 mg total) by mouth daily. 07/10/21   Osvaldo Shipper, MD  aspirin 81 MG chewable tablet Chew 81 mg by mouth every morning.    [provider]  brinzolamide (AZOPT) 1 % ophthalmic suspension Place 1 drop into both eyes 2 (two) times daily.     [provider]  calcium carbonate (OSCAL) 1500 (600 Ca) MG TABS tablet Take 600 mg of elemental calcium by mouth at bedtime.    [provider]  cholecalciferol (VITAMIN D3) 25 MCG (1000 UNIT) tablet Take 1,000 Units by mouth at bedtime.    [provider]  clopidogrel (PLAVIX) 75 MG tablet Plavix 75 mg daily beginning 07/26/2021 07/05/21   Angiulli, Mcarthur Rossetti, PA-C  docusate sodium (COLACE) 100 MG capsule Take 100 mg by mouth every other day.    [provider]  donepezil  (ARICEPT) 5 MG tablet Take 1 tablet (5 mg total) by mouth at bedtime. 09/19/22   Windell Norfolk, MD  melatonin 3 MG TABS tablet Take 3 mg by mouth at bedtime.    [provider]  methocarbamol (ROBAXIN) 500 MG tablet Take 500 mg by mouth every 8 (eight) hours as needed for muscle spasms.    [provider]  mirtazapine (REMERON) 15 MG tablet Take 15 mg by mouth at bedtime.    [provider]  MYRBETRIQ 25 MG TB24 tablet Take 25 mg by mouth daily. 11/18/22   [provider]  omeprazole (PRILOSEC) 20 MG capsule Take 20 mg by mouth daily.    [provider]  ondansetron (ZOFRAN) 4 MG tablet Take 4 mg by mouth every 8 (eight) hours as needed for nausea or vomiting. Take every 6-8 hours as needed    [provider]  polyethylene glycol (MIRALAX / GLYCOLAX) 17 g packet Take 17 g by mouth as directed. Sunday & Wednesday    [provider]  polyethylene glycol (MIRALAX / GLYCOLAX) 17 g packet Take 17 g by mouth as needed.    [provider]  rosuvastatin (CRESTOR) 10 MG tablet Take 1 tablet (10 mg total) by mouth daily. 07/05/21   Angiulli, Mcarthur Rossetti, PA-C  timolol (BETIMOL) 0.5 % ophthalmic solution Place 1 drop into both eyes 2 (two) times daily. Drop one drop in both  eyes twice a day    [provider]  valsartan (DIOVAN) 320 MG tablet Take 1 tablet (320 mg total) by mouth daily. 07/05/21   Angiulli, Mcarthur Rossetti, PA-C      Allergies    Crab [shellfish allergy], Fosamax [alendronate sodium], Hydrochlorothiazide, Losartan, Raloxifene, Serotonin reuptake inhibitors (ssris), and Ace inhibitors    Review of Systems   Review of Systems  All other systems reviewed and are negative.   Physical Exam Updated Vital Signs BP 137/62   Pulse 89   Temp 97.9 F (36.6 C) (Oral)   Resp 12   Ht 5\' 4"  (1.626 m)   Wt 68 kg   SpO2 96%   BMI 25.75 kg/m  Physical Exam Vitals and nursing note reviewed.  Constitutional:      General: She  is not in acute distress.    Appearance: She is well-developed.     Comments: GCS 15, ABC intact  HENT:     Head: Normocephalic.     Comments: Mild abrasion to the right forehead Eyes:     Extraocular Movements: Extraocular movements intact.     Conjunctiva/sclera: Conjunctivae normal.     Pupils: Pupils are equal, round, and reactive to light.  Neck:     Comments: No midline tenderness to palpation of the cervical spine.  Range of motion intact Cardiovascular:     Rate and Rhythm: Normal rate and regular rhythm.  Pulmonary:     Effort: Pulmonary effort is normal. No respiratory distress.     Breath sounds: Normal breath sounds.  Chest:     Comments: Clavicles stable nontender to AP compression.  Chest wall stable and nontender to AP and lateral compression. Abdominal:     Palpations: Abdomen is soft.     Tenderness: There is no abdominal tenderness.     Comments: Pelvis stable to lateral compression  Musculoskeletal:     Cervical back: Neck supple.     Comments: No midline tenderness to palpation of the thoracic or lumbar spine.  Extremities atraumatic with intact range of motion  Skin:    General: Skin is warm and dry.  Neurological:     Mental Status: She is alert.     Comments: Cranial nerves II through XII grossly intact.  Moving all 4 extremities spontaneously.  Sensation grossly intact all 4 extremities     ED Results / Procedures / Treatments   Labs (all labs ordered are listed, but only abnormal results are displayed) Labs Reviewed - No data to display  EKG None  Radiology CT Head Wo Contrast Result Date: 07/22/2023 CLINICAL DATA:  Head trauma, minor (Age >= 65y). Mechanical fall, hitting head on the floor. On Plavix. Forehead hematoma. EXAM: CT HEAD WITHOUT CONTRAST TECHNIQUE: Contiguous axial images were obtained from the base of the skull through the vertex without intravenous contrast. RADIATION DOSE REDUCTION: This exam was performed according to the  departmental dose-optimization program which includes automated exposure control, adjustment of the mA and/or kV according to patient size and/or use of iterative reconstruction technique. COMPARISON:  Head CT 05/29/2022 FINDINGS: Brain: There is no evidence of an acute infarct, intracranial hemorrhage, mass, midline shift, or extra-axial fluid collection. Confluent hypodensities in the cerebral white matter are similar to the prior CT and are nonspecific but compatible with extensive chronic small vessel ischemic disease. Chronic infarcts are again seen in the basal ganglia regions bilaterally, left thalamus, and right cerebellar hemisphere. Mild cerebral atrophy is within normal limits for age. Vascular: Calcified atherosclerosis  at the skull base. No hyperdense vessel. Skull: No acute fracture or suspicious osseous lesion. Sinuses/Orbits: Visualized paranasal sinuses and mastoid air cells are clear. Bilateral cataract extraction. Other: None. IMPRESSION: 1. No evidence of acute intracranial abnormality. 2. Extensive chronic small vessel ischemic disease. Electronically Signed   By: Sebastian Ache M.D.   On: 07/22/2023 13:07    Procedures Procedures    Medications Ordered in ED Medications - No data to display  ED Course/ Medical Decision Making/ A&P                                 Medical Decision Making Amount and/or Complexity of Data Reviewed Radiology: ordered.    82 year old female with medical history significant for prior CVA on Plavix who presents to the emergency department after a ground-level mechanical fall.  The patient states that she was carrying items at her assisted living facility when she lost her balance while carrying the items and fell striking her forehead on carpet.  No loss of consciousness.  She is on Plavix and has been compliant with the medication.  She is not on anticoagulation.  She denies any other injuries or complaints.  She arrives GCS 15, ABC intact.  On  arrival, the patient was vitally stable.  Physical exam with a mild abrasion to the right forehead, Otherwise was unremarkable with a negative primary and secondary trauma survey.  Patient C-spine cleared by Nexus criteria.  CT imaging of the head obtained given the patient's fall and mild head trauma on Plavix. CT of the head was negative for acute intracranial abnormality.  Patient was overall well-appearing, stable for discharge with outpatient follow-up as needed.    Final Clinical Impression(s) / ED Diagnoses Final diagnoses:  Fall, initial encounter    Rx / DC Orders ED Discharge Orders     None         Ernie Avena, MD 07/22/23 1333

## 2023-07-22 NOTE — Discharge Instructions (Addendum)
Your CT imaging was negative for acute bleeding or other intracranial abnormality

## 2023-07-22 NOTE — ED Triage Notes (Signed)
Pt caox4 reporting mechanical fall from losing balance causing her to hit her head on the floor in the hallway. Pt denies LOC, denies neck or back pain, and denies numbness/weakness/tingling to extremities. Pt does take Plavix. Hematoma to R forehead.

## 2023-07-22 NOTE — ED Notes (Signed)
Discharge instructions, follow up care, and prescriptions reviewed and explained, pt verbalized understanding.  

## 2023-07-24 DIAGNOSIS — G3184 Mild cognitive impairment, so stated: Secondary | ICD-10-CM | POA: Diagnosis not present

## 2023-07-24 DIAGNOSIS — Z0000 Encounter for general adult medical examination without abnormal findings: Secondary | ICD-10-CM | POA: Diagnosis not present

## 2023-07-24 DIAGNOSIS — M79645 Pain in left finger(s): Secondary | ICD-10-CM | POA: Diagnosis not present

## 2023-07-24 DIAGNOSIS — I10 Essential (primary) hypertension: Secondary | ICD-10-CM | POA: Diagnosis not present

## 2023-07-24 DIAGNOSIS — E7800 Pure hypercholesterolemia, unspecified: Secondary | ICD-10-CM | POA: Diagnosis not present

## 2023-07-24 DIAGNOSIS — M1612 Unilateral primary osteoarthritis, left hip: Secondary | ICD-10-CM | POA: Diagnosis not present

## 2023-07-24 DIAGNOSIS — R7303 Prediabetes: Secondary | ICD-10-CM | POA: Diagnosis not present

## 2023-07-24 DIAGNOSIS — I700 Atherosclerosis of aorta: Secondary | ICD-10-CM | POA: Diagnosis not present

## 2023-07-24 DIAGNOSIS — N281 Cyst of kidney, acquired: Secondary | ICD-10-CM | POA: Diagnosis not present

## 2023-07-24 DIAGNOSIS — M810 Age-related osteoporosis without current pathological fracture: Secondary | ICD-10-CM | POA: Diagnosis not present

## 2023-07-24 DIAGNOSIS — F325 Major depressive disorder, single episode, in full remission: Secondary | ICD-10-CM | POA: Diagnosis not present

## 2023-07-24 DIAGNOSIS — Z66 Do not resuscitate: Secondary | ICD-10-CM | POA: Diagnosis not present

## 2023-07-25 ENCOUNTER — Other Ambulatory Visit: Payer: Self-pay | Admitting: Internal Medicine

## 2023-07-25 DIAGNOSIS — M8589 Other specified disorders of bone density and structure, multiple sites: Secondary | ICD-10-CM

## 2023-07-28 ENCOUNTER — Ambulatory Visit
Admission: RE | Admit: 2023-07-28 | Discharge: 2023-07-28 | Disposition: A | Discharge status: Home / Self Care | Payer: Medicare Other | Source: Ambulatory Visit | Attending: Internal Medicine | Admitting: Internal Medicine

## 2023-07-28 ENCOUNTER — Other Ambulatory Visit: Payer: Self-pay | Admitting: Internal Medicine

## 2023-07-28 DIAGNOSIS — M79645 Pain in left finger(s): Secondary | ICD-10-CM

## 2023-07-28 DIAGNOSIS — M79642 Pain in left hand: Secondary | ICD-10-CM | POA: Diagnosis not present

## 2023-07-29 DIAGNOSIS — N393 Stress incontinence (female) (male): Secondary | ICD-10-CM | POA: Diagnosis not present

## 2023-07-29 DIAGNOSIS — M6281 Muscle weakness (generalized): Secondary | ICD-10-CM | POA: Diagnosis not present

## 2023-07-29 DIAGNOSIS — R350 Frequency of micturition: Secondary | ICD-10-CM | POA: Diagnosis not present

## 2023-08-05 DIAGNOSIS — M6281 Muscle weakness (generalized): Secondary | ICD-10-CM | POA: Diagnosis not present

## 2023-08-05 DIAGNOSIS — N3946 Mixed incontinence: Secondary | ICD-10-CM | POA: Diagnosis not present

## 2023-08-05 DIAGNOSIS — R350 Frequency of micturition: Secondary | ICD-10-CM | POA: Diagnosis not present

## 2023-08-13 DIAGNOSIS — N3946 Mixed incontinence: Secondary | ICD-10-CM | POA: Diagnosis not present

## 2023-08-13 DIAGNOSIS — R350 Frequency of micturition: Secondary | ICD-10-CM | POA: Diagnosis not present

## 2023-08-13 DIAGNOSIS — M6281 Muscle weakness (generalized): Secondary | ICD-10-CM | POA: Diagnosis not present

## 2023-08-27 DIAGNOSIS — R350 Frequency of micturition: Secondary | ICD-10-CM | POA: Diagnosis not present

## 2023-08-27 DIAGNOSIS — N3946 Mixed incontinence: Secondary | ICD-10-CM | POA: Diagnosis not present

## 2023-08-27 DIAGNOSIS — M6281 Muscle weakness (generalized): Secondary | ICD-10-CM | POA: Diagnosis not present

## 2023-09-03 DIAGNOSIS — N3946 Mixed incontinence: Secondary | ICD-10-CM | POA: Diagnosis not present

## 2023-09-03 DIAGNOSIS — N393 Stress incontinence (female) (male): Secondary | ICD-10-CM | POA: Diagnosis not present

## 2023-09-03 DIAGNOSIS — M6281 Muscle weakness (generalized): Secondary | ICD-10-CM | POA: Diagnosis not present

## 2023-09-10 DIAGNOSIS — M6281 Muscle weakness (generalized): Secondary | ICD-10-CM | POA: Diagnosis not present

## 2023-09-10 DIAGNOSIS — N3946 Mixed incontinence: Secondary | ICD-10-CM | POA: Diagnosis not present

## 2023-09-10 DIAGNOSIS — R350 Frequency of micturition: Secondary | ICD-10-CM | POA: Diagnosis not present

## 2023-09-25 ENCOUNTER — Ambulatory Visit: Payer: Medicare Other | Admitting: Neurology

## 2023-09-25 ENCOUNTER — Encounter: Payer: Self-pay | Admitting: Neurology

## 2023-09-25 VITALS — BP 170/73 | HR 86 | Ht 64.5 in | Wt 161.5 lb

## 2023-09-25 DIAGNOSIS — I639 Cerebral infarction, unspecified: Secondary | ICD-10-CM | POA: Diagnosis not present

## 2023-09-25 DIAGNOSIS — G3184 Mild cognitive impairment, so stated: Secondary | ICD-10-CM

## 2023-09-25 MED ORDER — DONEPEZIL HCL 10 MG PO TABS: 10.0000 mg | ORAL_TABLET | Freq: Every day | ORAL | 3 refills | Status: AC

## 2023-09-25 NOTE — Progress Notes (Signed)
 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  Last visit was a year ago.  Since then husband tells me that things has been stable.  She is sometimes more forgetful but she is independent in all activities daily living.  They do live in an independent living facility.  Currently the patient and husband have no concerns at the moment.  She is compliant with her medications including Aricept.    INTERVAL HISTORY 09/19/2022 Patient presents today for follow-up, she is accompanied by her husband.  Since last visit a year ago, she reported she is doing much better.  She said a few months after our initial visit she was admitted to [facility] in their memory unit.  At that time she was felt to have worsening dementia because she attempted to leave the nursing home one night.  But while being in the memory unit, she continued to improve, her symptoms got better and she was discharged.  Currently she is at home with her husband Sandra Becker.  She is not doing any cooking, but husband stated that she does help with cleaning.  She does read several books a week.  She does not need any help with self care. She does not drive, she is no longer doing their taxes.  She does have word finding difficulty but again there is an improvement in her memory and she continues to improve every day.   HISTORY OF PRESENT ILLNESS:  This is a 83 year old woman with past medical history of hypertension, hyperlipidemia, TIA who was admitted in the hospital in  November for acute left basal ganglia stroke.  Patient initially presented with slurred speech and right-sided weakness.  MRI of her brain showed a left basal ganglia infarct, etiology likely small vessel disease but cannot rule out cardioembolic source.  Echocardiogram was done showing EF 60 to 65%, no documented A-fib.  Her EEG was normal no seizures.  Her stroke labs show LDL of 69 and hemoglobin A1c of 6.0.  She was recommended to start aspirin and Brilinta for 30 days then to continue with Plavix thereafter.  She was also recommended for a cardiac monitor, no evidence of atrial fibrillation.  After the initial hospitalization she was sent to rehab.   She presented to the ED a couple weeks later after slumping over, MRI did not show any acute stroke but show extension of her previous basal ganglia, corona radiata stroke.  Her episode was felt to be recrudescence of stroke symptoms. In January 26, she presented to the hospital for generalized weakness found to be hyponatremic to 116.  Husband said that they felt the hyponatremia was secondary to a her antidepressant Celexa which was switched to mirtazapine.  She was also noted to have a UTI which was treated with antibiotics.  Husband noted that patient now stays at [facility] re[facility], she is doing physical therapy..  There is still some issue with confusion and abnormal behavior.  She left the room 1 night and start wandering, patient does not remember the event.  Husband said the patient  get fixed on matter and wont let them go.  She has a propensity to go to the bathroom multiple times during the day even when she does not need to use it and there is also short-term confusion and some worsening in memory.  Per patient she thinks she is doing all right he does not have any pain currently has no complaint and feels that her speech is fair.   OTHER MEDICAL CONDITIONS: Hypertension, hyperlipidemia    REVIEW OF SYSTEMS: Full 14 system review of systems  performed and negative with exception of: as noted in the HPI  ALLERGIES: Allergies  Allergen Reactions   Crab [Shellfish Allergy] Other (See Comments)    From skin test   Fosamax [Alendronate Sodium] Other (See Comments)    Difficult swallowing   Hydrochlorothiazide Other (See Comments)    hyponatremia   Losartan Cough   Raloxifene Other (See Comments)    Unknown reaction   Serotonin Reuptake Inhibitors (Ssris) Other (See Comments)    Hyponatremia   Ace Inhibitors Cough    Reaction to losartan (pt tolerates valsartan)    HOME MEDICATIONS: Outpatient Medications Prior to Visit  Medication Sig Dispense Refill   acetaminophen (TYLENOL) 325 MG tablet Take 650 mg by mouth in the morning.     acetaminophen (TYLENOL) 325 MG tablet Take 650 mg by mouth every 4 (four) hours as needed.     amLODipine (NORVASC) 5 MG tablet Take 1 tablet (5 mg total) by mouth daily. 30 tablet 1   aspirin 81 MG chewable tablet Chew 81 mg by mouth every morning.     brinzolamide (AZOPT) 1 % ophthalmic suspension Place 1 drop into both eyes 2 (two) times daily.      calcium carbonate (OSCAL) 1500 (600 Ca) MG TABS tablet Take 600 mg of elemental calcium by mouth at bedtime.     cholecalciferol (VITAMIN D3) 25 MCG (1000 UNIT) tablet Take 1,000 Units by mouth at bedtime.     clopidogrel (PLAVIX) 75 MG tablet Plavix 75 mg daily beginning 07/26/2021 30 tablet 1   docusate sodium (COLACE) 100 MG capsule Take 100 mg by mouth every other day.     melatonin 3 MG TABS tablet Take 3 mg by mouth at bedtime.     methocarbamol (ROBAXIN) 500 MG tablet Take 500 mg by mouth every 8 (eight) hours as needed for muscle spasms.     mirtazapine (REMERON) 15 MG tablet Take 15 mg by mouth at bedtime.     MYRBETRIQ 25 MG TB24 tablet Take 25 mg by mouth daily.     omeprazole (PRILOSEC) 20 MG capsule Take 20 mg by mouth daily.     ondansetron (ZOFRAN) 4 MG tablet Take 4 mg by mouth every 8 (eight) hours as needed for nausea or vomiting.  Take every 6-8 hours as needed     polyethylene glycol (MIRALAX / GLYCOLAX) 17 g packet Take 17 g by mouth as directed. Sunday & Wednesday     polyethylene glycol (MIRALAX / GLYCOLAX) 17 g packet Take 17 g by mouth as needed.     rosuvastatin (CRESTOR) 10 MG tablet Take 1 tablet (10 mg total) by mouth daily. 30 tablet 0   timolol (BETIMOL) 0.5 % ophthalmic solution Place 1 drop into both eyes 2 (two) times daily. Drop one drop in both eyes twice a day     valsartan (DIOVAN) 320 MG tablet Take 1 tablet (320 mg total) by mouth daily. 30 tablet 0   donepezil (ARICEPT) 5  MG tablet Take 1 tablet (5 mg total) by mouth at bedtime. 30 tablet 11   No facility-administered medications prior to visit.    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; no chemo or radiation   Glaucoma    Hyperlipidemia    Hypertension     PAST SURGICAL HISTORY: Past Surgical History:  Procedure Laterality Date   MASTECTOMY Bilateral 1993   TONSILLECTOMY AND ADENOIDECTOMY  1947   TUBAL LIGATION  1974    FAMILY HISTORY: Family History  Problem Relation Age of Onset   Breast cancer Mother    Colon cancer Neg Hx     SOCIAL HISTORY: Social History   Socioeconomic History   Marital status: Married    Spouse name: Not on file   Number of children: 2   Years of education: Not on file   Highest education level: Not on file  Occupational History   Not on file  Tobacco Use   Smoking status: Former    Current packs/day: 0.00    Average packs/day: 1 pack/day for 5.0 years (5.0 ttl pk-yrs)    Types: Cigarettes    Start date: 07/29/1958    Quit date: 07/30/1963    Years since quitting: 60.1   Smokeless tobacco: Never  Vaping Use   Vaping status: Never Used  Substance and Sexual Activity   Alcohol use: Not Currently    Alcohol/week: 21.0 standard drinks of alcohol    Types: 21 Glasses of wine per week    Comment: occ   Drug use: No    Sexual activity: Not Currently  Other Topics Concern   Not on file  Social History Narrative   Not on file   Social Drivers of Health   Financial Resource Strain: Not on file  Food Insecurity: Not on file  Transportation Needs: Not on file  Physical Activity: Not on file  Stress: Not on file  Social Connections: Not on file  Intimate Partner Violence: Not on file    PHYSICAL EXAM  GENERAL EXAM/CONSTITUTIONAL: Vitals:  Vitals:   09/25/23 0957  BP: (!) 170/73  Pulse: 86  Weight: 161 lb 8 oz (73.3 kg)  Height: 5' 4.5" (1.638 m)   Body mass index is 27.29 kg/m. Wt Readings from Last 3 Encounters:  09/25/23 161 lb 8 oz (73.3 kg)  07/22/23 150 lb (68 kg)  01/29/23 149 lb 12.8 oz (67.9 kg)   Patient is in no distress; well developed, nourished and groomed; neck is supple  MUSCULOSKELETAL: Gait, strength, tone, movements noted in Neurologic exam below  NEUROLOGIC: MENTAL STATUS:      No data to display            09/25/2023   10:00 AM 09/19/2022   10:37 AM  Montreal Cognitive Assessment   Visuospatial/ Executive (0/5) 4 4  Naming (0/3) 3 2  Attention: Read list of digits (0/2) 1 2  Attention: Read list of letters (0/1) 1 0  Attention: Serial 7 subtraction starting at 100 (0/3) 1 1  Language: Repeat phrase (0/2) 1 2  Language : Fluency (0/1) 0 0  Abstraction (0/2) 2 2  Delayed Recall (0/5) 0 5  Orientation (0/6) 6 5  Total 19 23  Adjusted Score (based on education)  23    CRANIAL NERVE:  2nd, 3rd, 4th, 6th - pupils equal and reactive to light, visual fields full to confrontation, extraocular muscles intact, no nystagmus 5th -  facial sensation symmetric 7th - facial strength symmetric 8th - hearing intact 9th - palate elevates symmetrically, uvula midline 11th - shoulder shrug symmetric 12th - tongue protrusion midline  MOTOR:  normal bulk and tone, full strength in the BUE, BLE    SENSORY:  normal and symmetric to light touch   COORDINATION:   finger-nose-finger, fine finger movements normal  REFLEXES:  deep tendon reflexes present and symmetric  GAIT/STATION:  Ambulate with a cane.    DIAGNOSTIC DATA (LABS, IMAGING, TESTING) - I reviewed patient records, labs, notes, testing and imaging myself where available.  Lab Results  Component Value Date   WBC 7.5 07/19/2022   HGB 14.6 07/19/2022   HCT 44.1 07/19/2022   MCV 89.3 07/19/2022   PLT 333 07/19/2022      Component Value Date/Time   NA 140 07/19/2022 1454   NA 137 12/07/2021 0000   K 3.8 07/19/2022 1454   CL 100 07/19/2022 1454   CO2 29 07/19/2022 1454   GLUCOSE 135 (H) 07/19/2022 1454   BUN 13 07/19/2022 1454   BUN 13 12/07/2021 0000   CREATININE 0.70 07/19/2022 1454   CALCIUM 9.9 07/19/2022 1454   PROT 7.5 07/19/2022 1454   ALBUMIN 4.5 07/19/2022 1454   AST 40 07/19/2022 1454   ALT 45 (H) 07/19/2022 1454   ALKPHOS 57 07/19/2022 1454   BILITOT 0.4 07/19/2022 1454   GFRNONAA >60 07/19/2022 1454   GFRAA >60 09/25/2018 1429   Lab Results  Component Value Date   CHOL 120 07/09/2021   HDL 65 07/09/2021   LDLCALC 39 07/09/2021   TRIG 79 07/09/2021   CHOLHDL 1.8 07/09/2021   Lab Results  Component Value Date   HGBA1C 5.7 (H) 07/09/2021   No results found for: "VITAMINB12" Lab Results  Component Value Date   TSH 1.117 06/25/2021    MRI Brain without contrast  1. Interval expansion of previously identified left basal ganglia/corona radiata infarct, now measuring up to 3.6 cm. No associated hemorrhage or significant regional mass effect. 2. No other acute intracranial abnormality. 3. Underlying age-related cerebral atrophy with moderate chronic microvascular ischemic disease.   CTA Head and Neck 07/08/2022 1. Negative for intracranial large vessel occlusion 2. 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 3. Minimal atherosclerotic disease in the carotid  arteries. No carotid or vertebral artery stenosis in the neck.    ASSESSMENT AND PLAN  83 y.o. year old female with vascular risk factor including hypertension, hyperlipidemia, who is presenting for follow up for left basal ganglia stroke and memory decline.  In terms of the stroke, she has regained function of her weakness.  Will continue her Crestor, Norvasc and Plavix. In terms of her memory complaints, they tell me that her memory is stable, sometimes she is forgetful but other than that she is stable.  They do live in an independent living facility and patient is independent all actives of daily living.  She is compliant with the Aricept.  Plan is to increase to 10 mg and to continue to monitor patient.  At this time, she is the mild cognitive impairment vs. Mild dementia based on MOCA score. Advised them to continue following up with PCP and to contact me if her symptoms do get worse.  We also discussed getting Alzheimer disease biomarker but patient deferred at this moment.   1. Infarction of left basal ganglia (HCC)   2. Mild cognitive impairment  Patient Instructions  Increase Aricept to 10 mg nightly  Continue your other medications  Return as needed    There are well-accepted and sensible ways to reduce risk for Alzheimers disease and other degenerative brain disorders .  Exercise Daily Walk A daily 20 minute walk should be part of your routine. Disease related apathy can be a significant roadblock to exercise and the only way to overcome this is to make it a daily routine and perhaps have a reward at the end (something your loved one loves to eat or drink perhaps) or a personal trainer coming to the home can also be very useful. Most importantly, the patient is much more likely to exercise if the caregiver / spouse does it with him/her. In general a structured, repetitive schedule is best.  General Health: Any diseases which effect your body will effect your brain such as a  pneumonia, urinary infection, blood clot, heart attack or stroke. Keep contact with your primary care doctor for regular follow ups.  Sleep. A good nights sleep is healthy for the brain. Seven hours is recommended. If you have insomnia or poor sleep habits we can give you some instructions. If you have sleep apnea wear your mask.  Diet: Eating a heart healthy diet is also a good idea; fish and poultry instead of red meat, nuts (mostly non-peanuts), vegetables, fruits, olive oil or canola oil (instead of butter), minimal salt (use other spices to flavor foods), whole grain rice, bread, cereal and pasta and wine in moderation.Research is now showing that the MIND diet, which is a combination of The Mediterranean diet and the DASH diet, is beneficial for cognitive processing and longevity. Information about this diet can be found in The MIND Diet, a book by Alonna Minium, MS, RDN, and online at [employer]  Finances, Power of [address] and Advance Directives: You should consider putting legal safeguards in place with regard to financial and medical decision making. While the spouse always has power of attorney for medical and financial issues in the absence of any form, you should consider what you want in case the spouse / caregiver is no longer around or capable of making decisions.   No orders of the defined types were placed in this encounter.   Meds ordered this encounter  Medications   donepezil (ARICEPT) 10 MG tablet    Sig: Take 1 tablet (10 mg total) by mouth at bedtime.    Dispense:  90 tablet    Refill:  3    Return if symptoms worsen or fail to improve.    Windell Norfolk, MD 09/25/2023, 2:35 PM  [facility] [address] [place], [place] [address] [phone]

## 2023-09-25 NOTE — Patient Instructions (Addendum)
 Increase Aricept to 10 mg nightly  Continue your other medications  Return as needed    There are well-accepted and sensible ways to reduce risk for Alzheimers disease and other degenerative brain disorders .  Exercise Daily Walk A daily 20 minute walk should be part of your routine. Disease related apathy can be a significant roadblock to exercise and the only way to overcome this is to make it a daily routine and perhaps have a reward at the end (something your loved one loves to eat or drink perhaps) or a personal trainer coming to the home can also be very useful. Most importantly, the patient is much more likely to exercise if the caregiver / spouse does it with him/her. In general a structured, repetitive schedule is best.  General Health: Any diseases which effect your body will effect your brain such as a pneumonia, urinary infection, blood clot, heart attack or stroke. Keep contact with your primary care doctor for regular follow ups.  Sleep. A good nights sleep is healthy for the brain. Seven hours is recommended. If you have insomnia or poor sleep habits we can give you some instructions. If you have sleep apnea wear your mask.  Diet: Eating a heart healthy diet is also a good idea; fish and poultry instead of red meat, nuts (mostly non-peanuts), vegetables, fruits, olive oil or canola oil (instead of butter), minimal salt (use other spices to flavor foods), whole grain rice, bread, cereal and pasta and wine in moderation.Research is now showing that the MIND diet, which is a combination of The Mediterranean diet and the DASH diet, is beneficial for cognitive processing and longevity. Information about this diet can be found in The MIND Diet, a book by Alonna Minium, MS, RDN, and online at [employer]  Finances, Power of [address] and Advance Directives: You should consider putting legal safeguards in place with regard to financial and medical decision  making. While the spouse always has power of attorney for medical and financial issues in the absence of any form, you should consider what you want in case the spouse / caregiver is no longer around or capable of making decisions.

## 2023-09-30 DIAGNOSIS — H18513 Endothelial corneal dystrophy, bilateral: Secondary | ICD-10-CM | POA: Diagnosis not present

## 2023-09-30 DIAGNOSIS — H401232 Low-tension glaucoma, bilateral, moderate stage: Secondary | ICD-10-CM | POA: Diagnosis not present

## 2023-10-08 DIAGNOSIS — R350 Frequency of micturition: Secondary | ICD-10-CM | POA: Diagnosis not present

## 2023-10-08 DIAGNOSIS — M6281 Muscle weakness (generalized): Secondary | ICD-10-CM | POA: Diagnosis not present

## 2023-10-08 DIAGNOSIS — N3946 Mixed incontinence: Secondary | ICD-10-CM | POA: Diagnosis not present

## 2023-10-24 DIAGNOSIS — N39498 Other specified urinary incontinence: Secondary | ICD-10-CM | POA: Diagnosis not present

## 2023-10-24 DIAGNOSIS — I10 Essential (primary) hypertension: Secondary | ICD-10-CM | POA: Diagnosis not present

## 2023-10-24 DIAGNOSIS — E7800 Pure hypercholesterolemia, unspecified: Secondary | ICD-10-CM | POA: Diagnosis not present

## 2023-10-24 DIAGNOSIS — I700 Atherosclerosis of aorta: Secondary | ICD-10-CM | POA: Diagnosis not present

## 2023-10-24 DIAGNOSIS — Z8673 Personal history of transient ischemic attack (TIA), and cerebral infarction without residual deficits: Secondary | ICD-10-CM | POA: Diagnosis not present

## 2023-10-24 DIAGNOSIS — I6930 Unspecified sequelae of cerebral infarction: Secondary | ICD-10-CM | POA: Diagnosis not present

## 2023-10-24 DIAGNOSIS — G3184 Mild cognitive impairment, so stated: Secondary | ICD-10-CM | POA: Diagnosis not present

## 2023-10-24 DIAGNOSIS — H4089 Other specified glaucoma: Secondary | ICD-10-CM | POA: Diagnosis not present

## 2023-10-24 DIAGNOSIS — F325 Major depressive disorder, single episode, in full remission: Secondary | ICD-10-CM | POA: Diagnosis not present

## 2023-10-24 DIAGNOSIS — M810 Age-related osteoporosis without current pathological fracture: Secondary | ICD-10-CM | POA: Diagnosis not present

## 2023-10-30 DIAGNOSIS — M6281 Muscle weakness (generalized): Secondary | ICD-10-CM | POA: Diagnosis not present

## 2023-10-30 DIAGNOSIS — R350 Frequency of micturition: Secondary | ICD-10-CM | POA: Diagnosis not present

## 2023-10-30 DIAGNOSIS — N3946 Mixed incontinence: Secondary | ICD-10-CM | POA: Diagnosis not present

## 2023-11-05 DIAGNOSIS — N3946 Mixed incontinence: Secondary | ICD-10-CM | POA: Diagnosis not present

## 2023-11-05 DIAGNOSIS — M6281 Muscle weakness (generalized): Secondary | ICD-10-CM | POA: Diagnosis not present

## 2023-11-05 DIAGNOSIS — R350 Frequency of micturition: Secondary | ICD-10-CM | POA: Diagnosis not present

## 2023-11-12 DIAGNOSIS — R350 Frequency of micturition: Secondary | ICD-10-CM | POA: Diagnosis not present

## 2023-11-12 DIAGNOSIS — N3946 Mixed incontinence: Secondary | ICD-10-CM | POA: Diagnosis not present

## 2023-11-12 DIAGNOSIS — M6281 Muscle weakness (generalized): Secondary | ICD-10-CM | POA: Diagnosis not present

## 2023-11-19 DIAGNOSIS — R351 Nocturia: Secondary | ICD-10-CM | POA: Diagnosis not present

## 2023-11-19 DIAGNOSIS — N3946 Mixed incontinence: Secondary | ICD-10-CM | POA: Diagnosis not present

## 2023-11-19 DIAGNOSIS — M6281 Muscle weakness (generalized): Secondary | ICD-10-CM | POA: Diagnosis not present

## 2023-11-26 DIAGNOSIS — N3946 Mixed incontinence: Secondary | ICD-10-CM | POA: Diagnosis not present

## 2023-11-26 DIAGNOSIS — M6281 Muscle weakness (generalized): Secondary | ICD-10-CM | POA: Diagnosis not present

## 2023-11-26 DIAGNOSIS — R350 Frequency of micturition: Secondary | ICD-10-CM | POA: Diagnosis not present

## 2024-01-08 DIAGNOSIS — N3946 Mixed incontinence: Secondary | ICD-10-CM | POA: Diagnosis not present

## 2024-01-08 DIAGNOSIS — R350 Frequency of micturition: Secondary | ICD-10-CM | POA: Diagnosis not present

## 2024-02-06 DIAGNOSIS — L821 Other seborrheic keratosis: Secondary | ICD-10-CM | POA: Diagnosis not present

## 2024-02-06 DIAGNOSIS — D225 Melanocytic nevi of trunk: Secondary | ICD-10-CM | POA: Diagnosis not present

## 2024-02-06 DIAGNOSIS — D1801 Hemangioma of skin and subcutaneous tissue: Secondary | ICD-10-CM | POA: Diagnosis not present

## 2024-03-18 ENCOUNTER — Other Ambulatory Visit: Payer: Medicare Other

## 2024-04-02 DIAGNOSIS — H52203 Unspecified astigmatism, bilateral: Secondary | ICD-10-CM | POA: Diagnosis not present

## 2024-04-02 DIAGNOSIS — H401232 Low-tension glaucoma, bilateral, moderate stage: Secondary | ICD-10-CM | POA: Diagnosis not present

## 2024-04-02 DIAGNOSIS — Z961 Presence of intraocular lens: Secondary | ICD-10-CM | POA: Diagnosis not present

## 2024-04-07 DIAGNOSIS — H401222 Low-tension glaucoma, left eye, moderate stage: Secondary | ICD-10-CM | POA: Diagnosis not present

## 2024-04-14 DIAGNOSIS — H401212 Low-tension glaucoma, right eye, moderate stage: Secondary | ICD-10-CM | POA: Diagnosis not present

## 2024-04-27 DIAGNOSIS — M810 Age-related osteoporosis without current pathological fracture: Secondary | ICD-10-CM | POA: Diagnosis not present

## 2024-04-27 DIAGNOSIS — Z23 Encounter for immunization: Secondary | ICD-10-CM | POA: Diagnosis not present

## 2024-04-29 ENCOUNTER — Other Ambulatory Visit (HOSPITAL_BASED_OUTPATIENT_CLINIC_OR_DEPARTMENT_OTHER)

## 2024-05-12 ENCOUNTER — Ambulatory Visit (HOSPITAL_BASED_OUTPATIENT_CLINIC_OR_DEPARTMENT_OTHER)
Admission: RE | Admit: 2024-05-12 | Discharge: 2024-05-12 | Disposition: A | Discharge status: Home / Self Care | Source: Ambulatory Visit | Attending: Internal Medicine | Admitting: Internal Medicine

## 2024-05-12 DIAGNOSIS — M8589 Other specified disorders of bone density and structure, multiple sites: Secondary | ICD-10-CM | POA: Diagnosis not present

## 2024-05-12 DIAGNOSIS — M810 Age-related osteoporosis without current pathological fracture: Secondary | ICD-10-CM | POA: Diagnosis not present

## 2024-05-12 DIAGNOSIS — Z780 Asymptomatic menopausal state: Secondary | ICD-10-CM | POA: Diagnosis not present
